# Patient Record
Sex: Male | Born: 1950 | Race: White | Hispanic: No | Marital: Married | Smoking: Former smoker
Health system: Southern US, Community
[De-identification: ages and names within clinical notes are randomized; demographics above are authoritative.]

## PROBLEM LIST (undated history)

## (undated) DIAGNOSIS — F0150 Vascular dementia without behavioral disturbance: Secondary | ICD-10-CM

## (undated) DIAGNOSIS — Z940 Kidney transplant status: Secondary | ICD-10-CM

## (undated) DIAGNOSIS — E213 Hyperparathyroidism, unspecified: Secondary | ICD-10-CM

## (undated) DIAGNOSIS — I639 Cerebral infarction, unspecified: Secondary | ICD-10-CM

## (undated) DIAGNOSIS — R600 Localized edema: Secondary | ICD-10-CM

## (undated) DIAGNOSIS — I82409 Acute embolism and thrombosis of unspecified deep veins of unspecified lower extremity: Secondary | ICD-10-CM

## (undated) DIAGNOSIS — Z8619 Personal history of other infectious and parasitic diseases: Secondary | ICD-10-CM

## (undated) DIAGNOSIS — D6861 Antiphospholipid syndrome: Secondary | ICD-10-CM

## (undated) DIAGNOSIS — E785 Hyperlipidemia, unspecified: Secondary | ICD-10-CM

## (undated) DIAGNOSIS — K219 Gastro-esophageal reflux disease without esophagitis: Secondary | ICD-10-CM

## (undated) DIAGNOSIS — G459 Transient cerebral ischemic attack, unspecified: Secondary | ICD-10-CM

## (undated) DIAGNOSIS — M1990 Unspecified osteoarthritis, unspecified site: Secondary | ICD-10-CM

## (undated) DIAGNOSIS — I739 Peripheral vascular disease, unspecified: Secondary | ICD-10-CM

## (undated) DIAGNOSIS — L719 Rosacea, unspecified: Secondary | ICD-10-CM

## (undated) DIAGNOSIS — I10 Essential (primary) hypertension: Secondary | ICD-10-CM

## (undated) DIAGNOSIS — D649 Anemia, unspecified: Secondary | ICD-10-CM

## (undated) DIAGNOSIS — K259 Gastric ulcer, unspecified as acute or chronic, without hemorrhage or perforation: Secondary | ICD-10-CM

## (undated) HISTORY — DX: Cerebral infarction, unspecified: I63.9

## (undated) HISTORY — PX: CHOLECYSTECTOMY: SHX55

## (undated) HISTORY — PX: KNEE ARTHROSCOPY: SUR90

## (undated) HISTORY — PX: ELBOW SURGERY: SHX618

## (undated) HISTORY — PX: KIDNEY TRANSPLANT: SHX239

---

## 1999-02-08 DIAGNOSIS — Z940 Kidney transplant status: Secondary | ICD-10-CM

## 1999-02-08 HISTORY — DX: Kidney transplant status: Z94.0

## 2012-05-29 ENCOUNTER — Emergency Department: Payer: Self-pay | Admitting: Emergency Medicine

## 2014-03-28 ENCOUNTER — Ambulatory Visit: Payer: Self-pay | Admitting: Internal Medicine

## 2014-09-17 ENCOUNTER — Other Ambulatory Visit: Payer: Self-pay | Admitting: Internal Medicine

## 2014-09-17 DIAGNOSIS — R102 Pelvic and perineal pain unspecified side: Secondary | ICD-10-CM

## 2014-09-17 DIAGNOSIS — M545 Low back pain: Secondary | ICD-10-CM

## 2014-09-17 DIAGNOSIS — R197 Diarrhea, unspecified: Secondary | ICD-10-CM

## 2014-09-17 DIAGNOSIS — R1084 Generalized abdominal pain: Secondary | ICD-10-CM

## 2014-09-23 ENCOUNTER — Ambulatory Visit
Admission: RE | Admit: 2014-09-23 | Discharge: 2014-09-23 | Disposition: A | Discharge status: Home / Self Care | Payer: Medicare Other | Source: Ambulatory Visit | Attending: Internal Medicine | Admitting: Internal Medicine

## 2014-09-23 DIAGNOSIS — R197 Diarrhea, unspecified: Secondary | ICD-10-CM

## 2014-09-23 DIAGNOSIS — R102 Pelvic and perineal pain unspecified side: Secondary | ICD-10-CM

## 2014-09-23 DIAGNOSIS — I714 Abdominal aortic aneurysm, without rupture: Secondary | ICD-10-CM | POA: Diagnosis not present

## 2014-09-23 DIAGNOSIS — R1084 Generalized abdominal pain: Secondary | ICD-10-CM

## 2014-09-23 DIAGNOSIS — N200 Calculus of kidney: Secondary | ICD-10-CM | POA: Insufficient documentation

## 2014-09-23 DIAGNOSIS — Z940 Kidney transplant status: Secondary | ICD-10-CM | POA: Diagnosis not present

## 2014-09-23 DIAGNOSIS — M545 Low back pain: Secondary | ICD-10-CM

## 2014-10-01 ENCOUNTER — Other Ambulatory Visit: Payer: Self-pay

## 2014-10-01 ENCOUNTER — Emergency Department
Admission: EM | Admit: 2014-10-01 | Discharge: 2014-10-01 | Disposition: A | Discharge status: Home / Self Care | Payer: Medicare Other | Attending: Emergency Medicine | Admitting: Emergency Medicine

## 2014-10-01 ENCOUNTER — Encounter: Payer: Self-pay | Admitting: *Deleted

## 2014-10-01 DIAGNOSIS — R197 Diarrhea, unspecified: Secondary | ICD-10-CM | POA: Diagnosis not present

## 2014-10-01 DIAGNOSIS — I10 Essential (primary) hypertension: Secondary | ICD-10-CM | POA: Insufficient documentation

## 2014-10-01 DIAGNOSIS — Z87891 Personal history of nicotine dependence: Secondary | ICD-10-CM | POA: Diagnosis not present

## 2014-10-01 DIAGNOSIS — R55 Syncope and collapse: Secondary | ICD-10-CM | POA: Insufficient documentation

## 2014-10-01 DIAGNOSIS — F0150 Vascular dementia without behavioral disturbance: Secondary | ICD-10-CM | POA: Diagnosis not present

## 2014-10-01 DIAGNOSIS — N289 Disorder of kidney and ureter, unspecified: Secondary | ICD-10-CM | POA: Diagnosis not present

## 2014-10-01 HISTORY — DX: Transient cerebral ischemic attack, unspecified: G45.9

## 2014-10-01 HISTORY — DX: Acute embolism and thrombosis of unspecified deep veins of unspecified lower extremity: I82.409

## 2014-10-01 HISTORY — DX: Personal history of other infectious and parasitic diseases: Z86.19

## 2014-10-01 HISTORY — DX: Rosacea, unspecified: L71.9

## 2014-10-01 HISTORY — DX: Hyperparathyroidism, unspecified: E21.3

## 2014-10-01 HISTORY — DX: Antiphospholipid syndrome: D68.61

## 2014-10-01 HISTORY — DX: Hyperlipidemia, unspecified: E78.5

## 2014-10-01 HISTORY — DX: Essential (primary) hypertension: I10

## 2014-10-01 HISTORY — DX: Vascular dementia, unspecified severity, without behavioral disturbance, psychotic disturbance, mood disturbance, and anxiety: F01.50

## 2014-10-01 HISTORY — DX: Localized edema: R60.0

## 2014-10-01 HISTORY — DX: Kidney transplant status: Z94.0

## 2014-10-01 LAB — BASIC METABOLIC PANEL
Anion gap: 4 — ABNORMAL LOW (ref 5–15)
BUN: 31 mg/dL — AB (ref 6–20)
CALCIUM: 9.4 mg/dL (ref 8.9–10.3)
CO2: 24 mmol/L (ref 22–32)
CREATININE: 2.37 mg/dL — AB (ref 0.61–1.24)
Chloride: 113 mmol/L — ABNORMAL HIGH (ref 101–111)
GFR calc Af Amer: 31 mL/min — ABNORMAL LOW (ref >60)
GFR, EST NON AFRICAN AMERICAN: 27 mL/min — AB (ref >60)
GLUCOSE: 91 mg/dL (ref 65–99)
POTASSIUM: 3.7 mmol/L (ref 3.5–5.1)
SODIUM: 141 mmol/L (ref 135–145)

## 2014-10-01 LAB — CBC
HEMATOCRIT: 35.3 % — AB (ref 40.0–52.0)
Hemoglobin: 11.4 g/dL — ABNORMAL LOW (ref 13.0–18.0)
MCH: 29.1 pg (ref 26.0–34.0)
MCHC: 32.3 g/dL (ref 32.0–36.0)
MCV: 89.9 fL (ref 80.0–100.0)
PLATELETS: 128 10*3/uL — AB (ref 150–440)
RBC: 3.93 MIL/uL — ABNORMAL LOW (ref 4.40–5.90)
RDW: 15 % — AB (ref 11.5–14.5)
WBC: 7.3 10*3/uL (ref 3.8–10.6)

## 2014-10-01 LAB — TROPONIN I

## 2014-10-01 NOTE — ED Provider Notes (Signed)
[facility] Emergency Department Provider Note  ____________________________________________  Time seen: 1255  I have reviewed the triage vital signs and the nursing notes.   HISTORY  Chief Complaint Loss of Consciousness  syncope    HPI Eric Gill is a 64 y.o. male who was at church today in an activity area. There is a bar on the wall. The patient reports she was leaning up against the bar and has no further recollection until he awoke with everybody around him.  The patient is currently alert and communicative, but does have a history of some dementia. Additional history is provided by his wife and daughter.  The wife reports indeed he was leaning up against the bar. She saw him start to slump over and slowly dropped to the ground. There was a nurse and an EMT present who began to help evaluate and protect the patient. 911 was called.  The patient woke up fairly soon after the syncopal episode. He denies any palpitations, shortness of breath, or chest pain. He denies any injury from easing down to the ground. The wife reports that he will cut fairly quickly and was back to his baseline. He did not appear to be additionally confused or in any distress once he awoke.  Patient does have a history of "TIAs". The family tells me that these TIAs were seen on an MRI that were done by Dr. Brigitte Pulse of neurology. 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  At this time, the patient is at his baseline, alert and communicative, and in no distress, without any focal deficit.  Of note, the patient has apparently had a diarrhea for a number of weeks.    Past Medical History  Diagnosis Date  . Status post kidney transplant 2001  . Hyperparathyroidism   . DVT (deep venous thrombosis)   . TIA (transient ischemic attack)   . Essential hypertension   . History of CMV   . Pedal edema   . Rosacea   . Antiphospholipid antibody syndrome   .  Hyperlipidemia   . 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   History reviewed. No pertinent past surgical history.  No current outpatient prescriptions on file.  Allergies Lipitor and Statins  History reviewed. No pertinent family history.  Social History Social History  Substance Use Topics  . Smoking status: Former Research scientist (life sciences)  . Smokeless tobacco: None  . Alcohol Use: No    Review of Systems  Constitutional: Negative for fever. ENT: Negative for sore throat. Cardiovascular: Negative for chest pain. Positive for syncope. See history of present illness Respiratory: Negative for shortness of breath. Gastrointestinal: When the patient began to pass out, he was noted to be vomiting. He has a history of diarrhea for a number of weeks currently. He takes a fair amount of Imodium.  Genitourinary: Negative for dysuria. Musculoskeletal: No myalgias or injuries. Skin: Negative for rash. Neurological: Negative for headaches. History of TIAs or CVAs. History of dementia.   10-point ROS otherwise negative.  ____________________________________________   PHYSICAL EXAM:  VITAL SIGNS: ED Triage Vitals  Enc Vitals Group     BP 10/01/14 1223 169/76 mmHg     Pulse Rate 10/01/14 1223 50     Resp 10/01/14 1223 15     Temp 10/01/14 1223 98 F (36.7 C)     Temp Source 10/01/14 1223 Oral     SpO2  10/01/14 1223 98 %     Weight 10/01/14 1223 150 lb (68.04 kg)     Height 10/01/14 1223 5\' 6"  (1.676 m)     Head Cir --      Peak Flow --      Pain Score --      Pain Loc --      Pain Edu? --      Excl. in [place]? --     Constitutional: Alert, communicative. Well appearing and in no distress. ENT   Head: Normocephalic and atraumatic.   Nose: No congestion/rhinnorhea.   Mouth/Throat: Mucous membranes are moist. No lac on the tongue. Cardiovascular: Normal bradycardic rate of 58., regular rhythm, no murmur  noted Respiratory:  Normal respiratory effort, no tachypnea.    Breath sounds are clear and equal bilaterally.  Gastrointestinal: Soft and nontender. No distention.  Back: No muscle spasm, no tenderness, no CVA tenderness. Musculoskeletal: No deformity noted. Nontender with normal range of motion in all extremities.  No noted edema. Neurologic:  Normal speech, but patient appears to defer to his family for history. No gross focal neurologic deficits are appreciated. 5 over 5 strength in all 4 extremities, equal grip strength, good finger to nose coordination, if ocular muscles are intact, cranial nerves II-12 are intact. He does appear to have mild dementia, noted in our communication. Skin:  Skin is warm, dry. No rash noted. Psychiatric: Pleasant demeanor and appropriate interaction overall, but the patient appears to defer to his family. I suspect this is due to some cognitive impairment related to his dementia.  ____________________________________________    LABS (pertinent positives/negatives)   Labs Reviewed  BASIC METABOLIC PANEL - Abnormal; Notable for the following:    Chloride 113 (*)    BUN 31 (*)    Creatinine, Ser 2.37 (*)    GFR calc non Af Amer 27 (*)    GFR calc Af Amer 31 (*)    Anion gap 4 (*)    All other components within normal limits  CBC - Abnormal; Notable for the following:    RBC 3.93 (*)    Hemoglobin 11.4 (*)    HCT 35.3 (*)    RDW 15.0 (*)    Platelets 128 (*)    All other components within normal limits  TROPONIN I  URINALYSIS COMPLETEWITH MICROSCOPIC ([facility] ONLY)  CBG MONITORING, ED     ____________________________________________   EKG  ED ECG REPORT I, Nataliya Graig W, the attending physician, personally viewed and interpreted this ECG.   Date: 10/01/2014  EKG Time: 12:49 PM  Rate: 53  Rhythm: Sinus bradycardia  Axis: Normal  Intervals: Normal  ST&T Change: None noted   ____________________________________________     PROCEDURES  ____________________________________________   INITIAL IMPRESSION / ASSESSMENT AND PLAN / ED COURSE  Pertinent labs & imaging results that were available during my care of the patient were reviewed by me and considered in my medical decision making (see chart for details).  Pleasant, alert, 64 year old male with a history of dementia, no cardiac history, with recent diarrhea, not drinking much fluids, and now with a syncopal episode. He had no prodrome, he had no seizure-like activity, he returned to baseline cognition and communication soon after the episode.  His renal function appears to be a little elevated today. I reviewed the numbers with his wife, who says that his renal function is usually lower. We will give him IV fluids and check orthostatic blood pressure readings. His EKG looks normal. If all is  well then, we will likely send him home.  Due to the history of recent diarrhea, we will attempt to obtain a C. difficile test if he is able to provide a stool sample.  ----------------------------------------- 2:46 PM on 10/01/2014 -----------------------------------------  Patient appears well and has had a stable blood pressure and heart rate through his stay. His EKG looks good and his cardiac monitor is not shown any dysrhythmia.  I've spoken with the patient's primary physician, Fulton Reek. He reviewed the patient's recent lab tests that showed a BUN of 29 and a creatinine of 2.1. We have treated the patient with 1 L of normal saline for the slight elevation in his baseline renal function. The patient also has a long history of diarrhea. Dr. Doy Hutching has made a referral to gastroenterology for ongoing evaluation. Dr. Doy Hutching and I are both comfortable with patient going home. Dr. Doy Hutching will see the patient on Friday for further evaluation. ____________________________________________   FINAL CLINICAL IMPRESSION(S) / ED DIAGNOSES  Final diagnoses:  Syncope and  collapse  Renal insufficiency      Ahmed Prima, MD 10/01/14 [phone]

## 2014-10-01 NOTE — ED Notes (Signed)
Hat placed in toilet to catch stool sample per MD order. Pt verbalized understanding of need for stool sample.

## 2014-10-01 NOTE — ED Notes (Signed)
Pt currently receiving NS bolus from EMS fluid bag per MD verbal order.

## 2014-10-01 NOTE — Discharge Instructions (Signed)
Your blood tests overall look good. Your renal function was only slightly higher than recent measurements. This was discussed here in the emergency department with you and your family, but also with your primary doctor, Dr. Doy Hutching. See Dr. Doy Hutching on Friday. Return to the emergency department if you have any further aching spells or other urgent concerns.  Syncope Syncope is a medical term for fainting or passing out. This means you lose consciousness and drop to the ground. People are generally unconscious for less than 5 minutes. You may have some muscle twitches for up to 15 seconds before waking up and returning to normal. Syncope occurs more often in older adults, but it can happen to anyone. While most causes of syncope are not dangerous, syncope can be a sign of a serious medical problem. It is important to seek medical care.  CAUSES  Syncope is caused by a sudden drop in blood flow to the brain. The specific cause is often not determined. Factors that can bring on syncope include:  Taking medicines that lower blood pressure.  Sudden changes in posture, such as standing up quickly.  Taking more medicine than prescribed.  Standing in one place for too long.  Seizure disorders.  Dehydration and excessive exposure to heat.  Low blood sugar (hypoglycemia).  Straining to have a bowel movement.  Heart disease, irregular heartbeat, or other circulatory problems.  Fear, emotional distress, seeing blood, or severe pain. SYMPTOMS  Right before fainting, you may:  Feel dizzy or light-headed.  Feel nauseous.  See all white or all black in your field of vision.  Have cold, clammy skin. DIAGNOSIS  Your health care provider will ask about your symptoms, perform a physical exam, and perform an electrocardiogram (ECG) to record the electrical activity of your heart. Your health care provider may also perform other heart or blood tests to determine the cause of your syncope which may  include:  Transthoracic echocardiogram (TTE). During echocardiography, sound waves are used to evaluate how blood flows through your heart.  Transesophageal echocardiogram (TEE).  Cardiac monitoring. This allows your health care provider to monitor your heart rate and rhythm in real time.  Holter monitor. This is a portable device that records your heartbeat and can help diagnose heart arrhythmias. It allows your health care provider to track your heart activity for several days, if needed.  Stress tests by exercise or by giving medicine that makes the heart beat faster. TREATMENT  In most cases, no treatment is needed. Depending on the cause of your syncope, your health care provider may recommend changing or stopping some of your medicines. HOME CARE INSTRUCTIONS  Have someone stay with you until you feel stable.  Do not drive, use machinery, or play sports until your health care provider says it is okay.  Keep all follow-up appointments as directed by your health care provider.  Lie down right away if you start feeling like you might faint. Breathe deeply and steadily. Wait until all the symptoms have passed.  Drink enough fluids to keep your urine clear or pale yellow.  If you are taking blood pressure or heart medicine, get up slowly and take several minutes to sit and then stand. This can reduce dizziness. SEEK IMMEDIATE MEDICAL CARE IF:   You have a severe headache.  You have unusual pain in the chest, abdomen, or back.  You are bleeding from your mouth or rectum, or you have black or tarry stool.  You have an irregular or very fast  heartbeat.  You have pain with breathing.  You have repeated fainting or seizure-like jerking during an episode.  You faint when sitting or lying down.  You have confusion.  You have trouble walking.  You have severe weakness.  You have vision problems. If you fainted, call your local emergency services (911 in [place] Do not drive  yourself to the hospital.  MAKE SURE YOU:  Understand these instructions.  Will watch your condition.  Will get help right away if you are not doing well or get worse. Document Released: 01/24/2005 Document Revised: 01/29/2013 Document Reviewed: 03/25/2011 [facility] Patient Information 2015 [place], [place]. This information is not intended to replace advice given to you by your health care provider. Make sure you discuss any questions you have with your health care provider.

## 2014-10-01 NOTE — ED Notes (Signed)
Pt arrived via EMS from [place] after syncopal episode. Per pts family pts right side went stiff and pt went unconscious for unknown amount of time. Family reports pt vomiting before going unconscious. Pt has hx of TIAs and reported f"not feeling right" for the past couple days. EMS reports neuro checks were negative and pt has not deficits upon arrival to ED. Pt verbalizes feeling better at this time but denies memory of event.

## 2014-10-03 ENCOUNTER — Other Ambulatory Visit: Payer: Self-pay | Admitting: Internal Medicine

## 2014-10-03 DIAGNOSIS — R55 Syncope and collapse: Secondary | ICD-10-CM

## 2014-10-07 ENCOUNTER — Ambulatory Visit
Admission: RE | Admit: 2014-10-07 | Discharge: 2014-10-07 | Disposition: A | Discharge status: Home / Self Care | Payer: Medicare Other | Source: Ambulatory Visit | Attending: Internal Medicine | Admitting: Internal Medicine

## 2014-10-07 DIAGNOSIS — R55 Syncope and collapse: Secondary | ICD-10-CM | POA: Diagnosis present

## 2014-10-29 ENCOUNTER — Emergency Department
Admission: EM | Admit: 2014-10-29 | Discharge: 2014-10-29 | Disposition: A | Discharge status: Home / Self Care | Payer: Medicare Other | Attending: Emergency Medicine | Admitting: Emergency Medicine

## 2014-10-29 ENCOUNTER — Emergency Department: Payer: Medicare Other

## 2014-10-29 ENCOUNTER — Encounter: Payer: Self-pay | Admitting: Emergency Medicine

## 2014-10-29 DIAGNOSIS — R531 Weakness: Secondary | ICD-10-CM | POA: Diagnosis present

## 2014-10-29 DIAGNOSIS — I10 Essential (primary) hypertension: Secondary | ICD-10-CM | POA: Insufficient documentation

## 2014-10-29 DIAGNOSIS — R410 Disorientation, unspecified: Secondary | ICD-10-CM

## 2014-10-29 DIAGNOSIS — Z87891 Personal history of nicotine dependence: Secondary | ICD-10-CM | POA: Diagnosis not present

## 2014-10-29 DIAGNOSIS — R200 Anesthesia of skin: Secondary | ICD-10-CM | POA: Insufficient documentation

## 2014-10-29 LAB — URINALYSIS COMPLETE WITH MICROSCOPIC (ARMC ONLY)
BILIRUBIN URINE: NEGATIVE
GLUCOSE, UA: NEGATIVE mg/dL
Hgb urine dipstick: NEGATIVE
Ketones, ur: NEGATIVE mg/dL
Leukocytes, UA: NEGATIVE
NITRITE: NEGATIVE
PH: 5 (ref 5.0–8.0)
Protein, ur: NEGATIVE mg/dL
Specific Gravity, Urine: 1.014 (ref 1.005–1.030)

## 2014-10-29 LAB — COMPREHENSIVE METABOLIC PANEL
ALK PHOS: 95 U/L (ref 38–126)
ALT: 13 U/L — AB (ref 17–63)
ANION GAP: 9 (ref 5–15)
AST: 28 U/L (ref 15–41)
Albumin: 3.9 g/dL (ref 3.5–5.0)
BUN: 31 mg/dL — ABNORMAL HIGH (ref 6–20)
CALCIUM: 9.6 mg/dL (ref 8.9–10.3)
CO2: 23 mmol/L (ref 22–32)
CREATININE: 2.44 mg/dL — AB (ref 0.61–1.24)
Chloride: 107 mmol/L (ref 101–111)
GFR, EST AFRICAN AMERICAN: 30 mL/min — AB (ref >60)
GFR, EST NON AFRICAN AMERICAN: 26 mL/min — AB (ref >60)
Glucose, Bld: 112 mg/dL — ABNORMAL HIGH (ref 65–99)
Potassium: 4 mmol/L (ref 3.5–5.1)
Sodium: 139 mmol/L (ref 135–145)
TOTAL PROTEIN: 6.6 g/dL (ref 6.5–8.1)
Total Bilirubin: 0.3 mg/dL (ref 0.3–1.2)

## 2014-10-29 LAB — CBC WITH DIFFERENTIAL/PLATELET
Basophils Absolute: 0 10*3/uL (ref 0–0.1)
Basophils Relative: 1 %
EOS PCT: 3 %
Eosinophils Absolute: 0.2 10*3/uL (ref 0–0.7)
HCT: 36.9 % — ABNORMAL LOW (ref 40.0–52.0)
HEMOGLOBIN: 11.8 g/dL — AB (ref 13.0–18.0)
LYMPHS ABS: 1.1 10*3/uL (ref 1.0–3.6)
LYMPHS PCT: 15 %
MCH: 28.8 pg (ref 26.0–34.0)
MCHC: 32 g/dL (ref 32.0–36.0)
MCV: 89.9 fL (ref 80.0–100.0)
MONOS PCT: 9 %
Monocytes Absolute: 0.7 10*3/uL (ref 0.2–1.0)
NEUTROS PCT: 72 %
Neutro Abs: 5.4 10*3/uL (ref 1.4–6.5)
Platelets: 140 10*3/uL — ABNORMAL LOW (ref 150–440)
RBC: 4.11 MIL/uL — AB (ref 4.40–5.90)
RDW: 14.7 % — ABNORMAL HIGH (ref 11.5–14.5)
WBC: 7.4 10*3/uL (ref 3.8–10.6)

## 2014-10-29 LAB — TROPONIN I: Troponin I: <0.03 ng/mL (ref <0.031)

## 2014-10-29 LAB — PROTIME-INR
INR: 2.02
PROTHROMBIN TIME: 23 s — AB (ref 11.4–15.0)

## 2014-10-29 MED ORDER — SODIUM CHLORIDE 0.9 % IV SOLN
1000.0000 mL | Freq: Once | INTRAVENOUS | Status: AC
  Administered 2014-10-29: 1000 mL via INTRAVENOUS

## 2014-10-29 NOTE — Discharge Instructions (Signed)
Confusion Confusion is the inability to think with your usual speed or clarity. Confusion may come on quickly or slowly over time. How quickly the confusion comes on depends on the cause. Confusion can be due to any number of causes. CAUSES   Concussion, head injury, or head trauma.  Seizures.  Stroke.  Fever.  Brain tumor.  Age related decreased brain function (dementia).  Heightened emotional states like rage or terror.  Mental illness in which the person loses the ability to determine what is real and what is not (hallucinations).  Infections such as a urinary tract infection (UTI).  Toxic effects from alcohol, drugs, or prescription medicines.  Dehydration and an imbalance of salts in the body (electrolytes).  Lack of sleep.  Low blood sugar (diabetes).  Low levels of oxygen from conditions such as chronic lung disorders.  Drug interactions or other medicine side effects.  Nutritional deficiencies, especially niacin, thiamine, vitamin C, or vitamin B.  Sudden drop in body temperature (hypothermia).  Change in routine, such as when traveling or hospitalized. SIGNS AND SYMPTOMS  People often describe their thinking as cloudy or unclear when they are confused. Confusion can also include feeling disoriented. That means you are unaware of where or who you are. You may also not know what the date or time is. If confused, you may also have difficulty paying attention, remembering, and making decisions. Some people also act aggressively when they are confused.  DIAGNOSIS  The medical evaluation of confusion may include:  Blood and urine tests.  X-rays.  Brain and nervous system tests.  Analyzing your brain waves (electroencephalogram or EEG).  Magnetic resonance imaging (MRI) of your head.  Computed tomography (CT) scan of your head.  Mental status tests in which your health care provider may ask many questions. Some of these questions may seem silly or strange,  but they are a very important test to help diagnose and treat confusion. TREATMENT  An admission to the hospital may not be needed, but a person with confusion should not be left alone. Stay with a family member or friend until the confusion clears. Avoid alcohol, pain relievers, or sedative drugs until you have fully recovered. Do not drive until directed by your health care provider. HOME CARE INSTRUCTIONS  What family and friends can do:  To find out if someone is confused, ask the person to state his or her name, age, and the date. If the person is unsure or answers incorrectly, he or she is confused.  Always introduce yourself, no matter how well the person knows you.  Often remind the person of his or her location.  Place a calendar and clock near the confused person.  Help the person with his or her medicines. You may want to use a pill box, an alarm as a reminder, or give the person each dose as prescribed.  Talk about current events and plans for the day.  Try to keep the environment calm, quiet, and peaceful.  Make sure the person keeps follow-up visits with his or her health care provider. PREVENTION  Ways to prevent confusion:  Avoid alcohol.  Eat a balanced diet.  Get enough sleep.  Take medicine only as directed by your health care provider.  Do not become isolated. Spend time with other people and make plans for your days.  Keep careful watch on your blood sugar levels if you are diabetic. SEEK IMMEDIATE MEDICAL CARE IF:   You develop severe headaches, repeated vomiting, seizures, blackouts, or  slurred speech.  There is increasing confusion, weakness, numbness, restlessness, or personality changes.  You develop a loss of balance, have marked dizziness, feel uncoordinated, or fall.  You have delusions, hallucinations, or develop severe anxiety.  Your family members think you need to be rechecked. Document Released: 03/03/2004 Document Revised: 06/10/2013  Document Reviewed: 03/01/2013 [facility] Patient Information 2015 [place], [place]. This information is not intended to replace advice given to you by your health care provider. Make sure you discuss any questions you have with your health care provider.  Paresthesia Paresthesia is an abnormal burning or prickling sensation. This sensation is generally felt in the hands, arms, legs, or feet. However, it may occur in any part of the body. It is usually not painful. The feeling may be described as:  Tingling or numbness.  "Pins and needles."  Skin crawling.  Buzzing.  Limbs "falling asleep."  Itching. Most people experience temporary (transient) paresthesia at some time in their lives. CAUSES  Paresthesia may occur when you breathe too quickly (hyperventilation). It can also occur without any apparent cause. Commonly, paresthesia occurs when pressure is placed on a nerve. The feeling quickly goes away once the pressure is removed. For some people, however, paresthesia is a long-lasting (chronic) condition caused by an underlying disorder. The underlying disorder may be:  A traumatic, direct injury to nerves. Examples include a:  Broken (fractured) neck.  Fractured skull.  A disorder affecting the brain and spinal cord (central nervous system). Examples include:  Transverse myelitis.  Encephalitis.  Transient ischemic attack.  Multiple sclerosis.  Stroke.  Tumor or blood vessel problems, such as an arteriovenous malformation pressing against the brain or spinal cord.  A condition that damages the peripheral nerves (peripheral neuropathy). Peripheral nerves are not part of the brain and spinal cord. These conditions include:  Diabetes.  Peripheral vascular disease.  Nerve entrapment syndromes, such as carpal tunnel syndrome.  Shingles.  Hypothyroidism.  Vitamin B12 deficiencies.  Alcoholism.  Heavy metal poisoning (lead, arsenic).  Rheumatoid arthritis.  Systemic  lupus erythematosus. DIAGNOSIS  Your caregiver will attempt to find the underlying cause of your paresthesia. Your caregiver may:  Take your medical history.  Perform a physical exam.  Order various lab tests.  Order imaging tests. TREATMENT  Treatment for paresthesia depends on the underlying cause. HOME CARE INSTRUCTIONS  Avoid drinking alcohol.  You may consider massage or acupuncture to help relieve your symptoms.  Keep all follow-up appointments as directed by your caregiver. SEEK IMMEDIATE MEDICAL CARE IF:   You feel weak.  You have trouble walking or moving.  You have problems with speech or vision.  You feel confused.  You cannot control your bladder or bowel movements.  You feel numbness after an injury.  You faint.  Your burning or prickling feeling gets worse when walking.  You have pain, cramps, or dizziness.  You develop a rash. MAKE SURE YOU:  Understand these instructions.  Will watch your condition.  Will get help right away if you are not doing well or get worse. Document Released: 01/14/2002 Document Revised: 04/18/2011 Document Reviewed: 10/15/2010 [facility] Patient Information 2015 [place], [place]. This information is not intended to replace advice given to you by your health care provider. Make sure you discuss any questions you have with your health care provider.

## 2014-10-29 NOTE — ED Notes (Signed)
Pt a&o, skin w/d.  Family reports pt had syncopal episode 3 weeks ago and has seemed fatigued since.  Reports another episode yesterday and went in to see Dr. Doy Hutching this am and reported right arm numbness with unclear time of onset.  Has resolved at this time.  Pt has mild dementia but answers questions appropriately at this time.  PERRL (2).  Grips are equal, pedal strength equal, no facial droop. Pt placed on monitor.  Dr. Jimmye Norman at bedside, no code stroke ordered.

## 2014-10-29 NOTE — ED Provider Notes (Signed)
[facility] Emergency Department Provider Note     Time seen: ----------------------------------------- 10:23 AM on 10/29/2014 -----------------------------------------    I have reviewed the triage vital signs and the nursing notes.   HISTORY  Chief Complaint Weakness    HPI Eric Gill is a 64 y.o. male presents to ER with single episode 3 weeks ago and feeling fatigued since that period time. He reports another episode yesterday, report some right armnumbness this morning. Numbness seemed to have resolved this time, also when he got to the desk at [facility] [employer] he cannot remember his phone number. This is unusual for him, he denies any other complaints.   Past Medical History  Diagnosis Date  . Status post kidney transplant 2001  . Hyperparathyroidism   . DVT (deep venous thrombosis)   . TIA (transient ischemic attack)   . Essential hypertension   . History of CMV   . Pedal edema   . Rosacea   . Antiphospholipid antibody syndrome   . Hyperlipidemia   . 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   History reviewed. No pertinent past surgical history.  Allergies Lipitor and Statins  Social History Social History  Substance Use Topics  . Smoking status: Former Research scientist (life sciences)  . Smokeless tobacco: None  . Alcohol Use: No    Review of Systems Constitutional: Negative for fever. Eyes: Negative for visual changes. ENT: Negative for sore throat. Cardiovascular: Negative for chest pain. Respiratory: Negative for shortness of breath. Gastrointestinal: Negative for abdominal pain, vomiting and diarrhea. Genitourinary: Negative for dysuria. Musculoskeletal: Negative for back pain. Skin: Negative for rash. Neurological: Negative for headaches, positive for numbness and confusion  10-point ROS otherwise negative.  ____________________________________________   PHYSICAL  EXAM:  VITAL SIGNS: ED Triage Vitals  Enc Vitals Group     BP 10/29/14 0939 139/68 mmHg     Pulse Rate 10/29/14 0939 56     Resp 10/29/14 0939 16     Temp 10/29/14 0941 97.8 F (36.6 C)     Temp Source 10/29/14 0941 Oral     SpO2 10/29/14 0939 97 %     Weight 10/29/14 0939 145 lb (65.772 kg)     Height 10/29/14 0939 5\' 6"  (1.676 m)     Head Cir --      Peak Flow --      Pain Score --      Pain Loc --      Pain Edu? --      Excl. in [place]? --     Constitutional: Alert and oriented. Well appearing and in no distress. Eyes: Conjunctivae are normal. PERRL. Normal extraocular movements. ENT   Head: Normocephalic and atraumatic.   Nose: No congestion/rhinnorhea.   Mouth/Throat: Mucous membranes are moist.   Neck: No stridor. Cardiovascular: Normal rate, regular rhythm. Normal and symmetric distal pulses are present in all extremities. No murmurs, rubs, or gallops. Respiratory: Normal respiratory effort without tachypnea nor retractions. Breath sounds are clear and equal bilaterally. No wheezes/rales/rhonchi. Gastrointestinal: Soft and nontender. No distention. No abdominal bruits.  Musculoskeletal: Nontender with normal range of motion in all extremities. No joint effusions.  No lower extremity tenderness nor edema. Neurologic:  Normal speech and language. No gross focal neurologic deficits are appreciated. Speech is normal. No gait instability. Skin:  Skin is warm, dry and intact. No rash noted. Psychiatric: Mood and affect are normal. Speech and behavior are normal. Patient exhibits  appropriate insight and judgment. ____________________________________________  EKG: Interpreted by me.sinus bradycardia with rate of 54 bpm, normal PR interval, normal QRS with, normal QT interval. On specific T wave flattening  ____________________________________________  ED COURSE:  Pertinent labs & imaging results that were available during my care of the patient were reviewed by me  and considered in my medical decision making (see chart for details). Patient needs CT imaging as well as basic labs. Will discuss with neurology. ____________________________________________    LABS (pertinent positives/negatives)  Labs Reviewed  CBC WITH DIFFERENTIAL/PLATELET - Abnormal; Notable for the following:    RBC 4.11 (*)    Hemoglobin 11.8 (*)    HCT 36.9 (*)    RDW 14.7 (*)    Platelets 140 (*)    All other components within normal limits  COMPREHENSIVE METABOLIC PANEL - Abnormal; Notable for the following:    Glucose, Bld 112 (*)    BUN 31 (*)    Creatinine, Ser 2.44 (*)    ALT 13 (*)    GFR calc non Af Amer 26 (*)    GFR calc Af Amer 30 (*)    All other components within normal limits  PROTIME-INR - Abnormal; Notable for the following:    Prothrombin Time 23.0 (*)    All other components within normal limits  TROPONIN I  URINALYSIS COMPLETEWITH MICROSCOPIC ([facility] ONLY)    RADIOLOGY Images were viewed by me  CT head IMPRESSION: 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  Chronic small vessel disease.  No acute intracranial abnormality. ____________________________________________  FINAL ASSESSMENT AND PLAN  Confusion, numbness  Plan: Patient with labs and imaging as dictated above. I have discussed the case with Dr. Manuella Ghazi from neurology. Patient has unremarkable workup at this point. No acute neurologic deficits are appreciated. He will continue outpatient follow-up and have an MRI. Currently is adequately anticoagulated.   Earleen Newport, MD   Earleen Newport, MD 10/29/14 [phone]

## 2014-10-29 NOTE — ED Notes (Signed)
Pt presents with weakness acute onset this am. Pt right arm numbness this am but none now. Pt with no slurred speech, equal hand grips, denies any headache or any vision changes at this time.

## 2014-10-31 LAB — URINE CULTURE

## 2015-02-23 ENCOUNTER — Encounter: Payer: Self-pay | Admitting: Emergency Medicine

## 2015-02-23 ENCOUNTER — Inpatient Hospital Stay
Admission: EM | Admit: 2015-02-23 | Discharge: 2015-03-08 | DRG: 378 | Disposition: A | Discharge status: Home / Self Care | Payer: Medicare Other | Attending: Internal Medicine | Admitting: Internal Medicine

## 2015-02-23 DIAGNOSIS — E785 Hyperlipidemia, unspecified: Secondary | ICD-10-CM | POA: Diagnosis present

## 2015-02-23 DIAGNOSIS — K640 First degree hemorrhoids: Secondary | ICD-10-CM | POA: Diagnosis present

## 2015-02-23 DIAGNOSIS — T8610 Unspecified complication of kidney transplant: Secondary | ICD-10-CM

## 2015-02-23 DIAGNOSIS — K219 Gastro-esophageal reflux disease without esophagitis: Secondary | ICD-10-CM | POA: Diagnosis present

## 2015-02-23 DIAGNOSIS — Z87891 Personal history of nicotine dependence: Secondary | ICD-10-CM

## 2015-02-23 DIAGNOSIS — Z7901 Long term (current) use of anticoagulants: Secondary | ICD-10-CM | POA: Diagnosis not present

## 2015-02-23 DIAGNOSIS — Z841 Family history of disorders of kidney and ureter: Secondary | ICD-10-CM

## 2015-02-23 DIAGNOSIS — F0150 Vascular dementia without behavioral disturbance: Secondary | ICD-10-CM | POA: Diagnosis present

## 2015-02-23 DIAGNOSIS — K449 Diaphragmatic hernia without obstruction or gangrene: Secondary | ICD-10-CM | POA: Diagnosis present

## 2015-02-23 DIAGNOSIS — N2581 Secondary hyperparathyroidism of renal origin: Secondary | ICD-10-CM | POA: Diagnosis present

## 2015-02-23 DIAGNOSIS — K5730 Diverticulosis of large intestine without perforation or abscess without bleeding: Secondary | ICD-10-CM | POA: Diagnosis present

## 2015-02-23 DIAGNOSIS — Z825 Family history of asthma and other chronic lower respiratory diseases: Secondary | ICD-10-CM | POA: Diagnosis not present

## 2015-02-23 DIAGNOSIS — D6861 Antiphospholipid syndrome: Secondary | ICD-10-CM | POA: Diagnosis present

## 2015-02-23 DIAGNOSIS — D649 Anemia, unspecified: Secondary | ICD-10-CM

## 2015-02-23 DIAGNOSIS — K922 Gastrointestinal hemorrhage, unspecified: Secondary | ICD-10-CM

## 2015-02-23 DIAGNOSIS — D62 Acute posthemorrhagic anemia: Secondary | ICD-10-CM

## 2015-02-23 DIAGNOSIS — N183 Chronic kidney disease, stage 3 (moderate): Secondary | ICD-10-CM | POA: Diagnosis present

## 2015-02-23 DIAGNOSIS — Z940 Kidney transplant status: Secondary | ICD-10-CM | POA: Diagnosis not present

## 2015-02-23 DIAGNOSIS — Z833 Family history of diabetes mellitus: Secondary | ICD-10-CM | POA: Diagnosis not present

## 2015-02-23 DIAGNOSIS — I129 Hypertensive chronic kidney disease with stage 1 through stage 4 chronic kidney disease, or unspecified chronic kidney disease: Secondary | ICD-10-CM | POA: Diagnosis present

## 2015-02-23 DIAGNOSIS — K921 Melena: Principal | ICD-10-CM | POA: Diagnosis present

## 2015-02-23 DIAGNOSIS — N1832 Chronic kidney disease, stage 3b: Secondary | ICD-10-CM

## 2015-02-23 DIAGNOSIS — K644 Residual hemorrhoidal skin tags: Secondary | ICD-10-CM | POA: Diagnosis present

## 2015-02-23 DIAGNOSIS — K2970 Gastritis, unspecified, without bleeding: Secondary | ICD-10-CM | POA: Diagnosis present

## 2015-02-23 DIAGNOSIS — Z8673 Personal history of transient ischemic attack (TIA), and cerebral infarction without residual deficits: Secondary | ICD-10-CM | POA: Diagnosis not present

## 2015-02-23 DIAGNOSIS — E872 Acidosis: Secondary | ICD-10-CM | POA: Diagnosis present

## 2015-02-23 DIAGNOSIS — K259 Gastric ulcer, unspecified as acute or chronic, without hemorrhage or perforation: Secondary | ICD-10-CM | POA: Diagnosis present

## 2015-02-23 DIAGNOSIS — Z8249 Family history of ischemic heart disease and other diseases of the circulatory system: Secondary | ICD-10-CM

## 2015-02-23 DIAGNOSIS — K317 Polyp of stomach and duodenum: Secondary | ICD-10-CM | POA: Diagnosis present

## 2015-02-23 DIAGNOSIS — D631 Anemia in chronic kidney disease: Secondary | ICD-10-CM | POA: Diagnosis present

## 2015-02-23 DIAGNOSIS — N179 Acute kidney failure, unspecified: Secondary | ICD-10-CM

## 2015-02-23 DIAGNOSIS — Z86718 Personal history of other venous thrombosis and embolism: Secondary | ICD-10-CM

## 2015-02-23 LAB — COMPREHENSIVE METABOLIC PANEL
ALBUMIN: 3.1 g/dL — AB (ref 3.5–5.0)
ALT: 13 U/L — AB (ref 17–63)
ANION GAP: 8 (ref 5–15)
AST: 19 U/L (ref 15–41)
Alkaline Phosphatase: 64 U/L (ref 38–126)
BILIRUBIN TOTAL: 0.6 mg/dL (ref 0.3–1.2)
BUN: 65 mg/dL — ABNORMAL HIGH (ref 6–20)
CALCIUM: 9.5 mg/dL (ref 8.9–10.3)
CHLORIDE: 110 mmol/L (ref 101–111)
CO2: 25 mmol/L (ref 22–32)
Creatinine, Ser: 2.19 mg/dL — ABNORMAL HIGH (ref 0.61–1.24)
GFR, EST AFRICAN AMERICAN: 34 mL/min — AB (ref >60)
GFR, EST NON AFRICAN AMERICAN: 30 mL/min — AB (ref >60)
Glucose, Bld: 104 mg/dL — ABNORMAL HIGH (ref 65–99)
POTASSIUM: 4.4 mmol/L (ref 3.5–5.1)
SODIUM: 143 mmol/L (ref 135–145)
Total Protein: 5.4 g/dL — ABNORMAL LOW (ref 6.5–8.1)

## 2015-02-23 LAB — CBC
HCT: 23.3 % — ABNORMAL LOW (ref 40.0–52.0)
HEMOGLOBIN: 7.3 g/dL — AB (ref 13.0–18.0)
MCH: 27.6 pg (ref 26.0–34.0)
MCHC: 31.4 g/dL — ABNORMAL LOW (ref 32.0–36.0)
MCV: 87.9 fL (ref 80.0–100.0)
Platelets: 130 10*3/uL — ABNORMAL LOW (ref 150–440)
RBC: 2.65 MIL/uL — AB (ref 4.40–5.90)
RDW: 14 % (ref 11.5–14.5)
WBC: 8.4 10*3/uL (ref 3.8–10.6)

## 2015-02-23 LAB — URINALYSIS COMPLETE WITH MICROSCOPIC (ARMC ONLY)
BILIRUBIN URINE: NEGATIVE
Bacteria, UA: NONE SEEN
Glucose, UA: NEGATIVE mg/dL
KETONES UR: NEGATIVE mg/dL
Leukocytes, UA: NEGATIVE
Nitrite: NEGATIVE
PH: 5 (ref 5.0–8.0)
Protein, ur: NEGATIVE mg/dL
Specific Gravity, Urine: 1.015 (ref 1.005–1.030)

## 2015-02-23 LAB — ABO/RH: ABO/RH(D): A NEG

## 2015-02-23 LAB — HEMOGLOBIN: HEMOGLOBIN: 7 g/dL — AB (ref 13.0–18.0)

## 2015-02-23 LAB — PROTIME-INR
INR: 2.92
Prothrombin Time: 30 seconds — ABNORMAL HIGH (ref 11.4–15.0)

## 2015-02-23 LAB — LIPASE, BLOOD: Lipase: 34 U/L (ref 11–51)

## 2015-02-23 MED ORDER — CALCITRIOL 0.25 MCG PO CAPS
0.2500 ug | ORAL_CAPSULE | Freq: Every day | ORAL | Status: DC
  Administered 2015-02-23 – 2015-03-04 (×10): 0.25 ug via ORAL
  Filled 2015-02-23 (×10): qty 1

## 2015-02-23 MED ORDER — DONEPEZIL HCL 5 MG PO TABS
10.0000 mg | ORAL_TABLET | Freq: Every day | ORAL | Status: DC
  Administered 2015-02-23 – 2015-03-07 (×13): 10 mg via ORAL
  Filled 2015-02-23 (×13): qty 2

## 2015-02-23 MED ORDER — PANTOPRAZOLE SODIUM 40 MG IV SOLR
40.0000 mg | Freq: Two times a day (BID) | INTRAVENOUS | Status: DC
  Administered 2015-02-23 – 2015-02-28 (×11): 40 mg via INTRAVENOUS
  Filled 2015-02-23 (×11): qty 40

## 2015-02-23 MED ORDER — PREDNISONE 5 MG PO TABS
5.0000 mg | ORAL_TABLET | Freq: Every day | ORAL | Status: DC
  Administered 2015-02-24 – 2015-03-05 (×8): 5 mg via ORAL
  Filled 2015-02-23 (×9): qty 1

## 2015-02-23 MED ORDER — CITALOPRAM HYDROBROMIDE 20 MG PO TABS
10.0000 mg | ORAL_TABLET | Freq: Every day | ORAL | Status: DC
Start: 2015-02-23 — End: 2015-03-08
  Administered 2015-02-23 – 2015-03-07 (×13): 10 mg via ORAL
  Filled 2015-02-23: qty 2
  Filled 2015-02-23: qty 1
  Filled 2015-02-23: qty 2
  Filled 2015-02-23 (×10): qty 1

## 2015-02-23 MED ORDER — TACROLIMUS 0.5 MG PO CAPS
0.5000 mg | ORAL_CAPSULE | Freq: Every day | ORAL | Status: DC
  Administered 2015-02-23 – 2015-03-04 (×10): 0.5 mg via ORAL
  Filled 2015-02-23 (×12): qty 1

## 2015-02-23 MED ORDER — MONTELUKAST SODIUM 10 MG PO TABS
10.0000 mg | ORAL_TABLET | Freq: Every day | ORAL | Status: DC
  Administered 2015-02-25 – 2015-03-07 (×9): 10 mg via ORAL
  Filled 2015-02-23 (×10): qty 1

## 2015-02-23 MED ORDER — MYCOPHENOLATE MOFETIL 250 MG PO CAPS
500.0000 mg | ORAL_CAPSULE | Freq: Two times a day (BID) | ORAL | Status: DC
  Administered 2015-02-23 – 2015-03-08 (×24): 500 mg via ORAL
  Filled 2015-02-23 (×25): qty 2

## 2015-02-23 MED ORDER — PANTOPRAZOLE SODIUM 40 MG IV SOLR
40.0000 mg | Freq: Once | INTRAVENOUS | Status: AC
  Administered 2015-02-23: 40 mg via INTRAVENOUS
  Filled 2015-02-23: qty 40

## 2015-02-23 MED ORDER — AMLODIPINE BESYLATE 5 MG PO TABS
5.0000 mg | ORAL_TABLET | Freq: Every day | ORAL | Status: DC
  Administered 2015-02-24 – 2015-02-27 (×4): 5 mg via ORAL
  Filled 2015-02-23 (×4): qty 1

## 2015-02-23 MED ORDER — SODIUM CHLORIDE 0.9 % IV SOLN
INTRAVENOUS | Status: DC
  Administered 2015-02-23 – 2015-02-25 (×3): via INTRAVENOUS

## 2015-02-23 MED ORDER — LOSARTAN POTASSIUM 50 MG PO TABS
50.0000 mg | ORAL_TABLET | Freq: Every day | ORAL | Status: DC
  Administered 2015-02-23 – 2015-03-06 (×12): 50 mg via ORAL
  Filled 2015-02-23 (×12): qty 1

## 2015-02-23 NOTE — ED Notes (Signed)
States awoke this am with lower abdominal pain and has had 3 episodes of vomiting today and one episode of diarrhea.  Denies c/o abdominal pain now.  Spouse called EMS because some emesis was black.  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

## 2015-02-23 NOTE — H&P (Signed)
[facility] [place] NAME: Eric Gill    MR#:  [id number]  DATE OF BIRTH:  [birth year]  DATE OF ADMISSION:  02/23/2015  PRIMARY CARE PHYSICIAN: Idelle Crouch, MD   REQUESTING/REFERRING PHYSICIAN: Archie Balboa  CHIEF COMPLAINT:   Chief Complaint  Patient presents with  . Emesis    HISTORY OF PRESENT ILLNESS: Eric Gill  is a 65 y.o. male with a known history of status post kidney transplant, hyperparathyroidism, DVT, TIA, antiphospholipid syndrome, chronic Coumadin use, vascular dementia, hyperlipidemia- had frequent episodes of feeling dizzy and he follows with Dr. neurology Dr. Manuella Ghazi also- after multiple work up finally they came to agreement that maybe it was because of his labetalol and they advised. It few months ago since then he is doing fine. Today morning he complained having some abdominal pain and did not wanted to wake up at that time wife went out for her medical appointment and returned after 2 hours when he noted him sitting in the couch and there was a coffee-ground vomit on the floor and on his clothes he said that he fell down and had to cut out to the couch he also appeared somewhat confused to the wife but she took the vitals his blood pressure and heart rate were stable.  In ER he was noted to have drop in his hemoglobin, and vitals were stable. Patient has some baseline dementia and wife and daughter were present in the room they are not surprised by his episode of somewhat confusion as they had multiple times this thing done and workups were done so they are not worried about that. Patient also complained of noticing dark stool for last 3-4 days to his wife and as his hemoglobin noted significantly lowered to his previous hemoglobin in the past ER physician suggested to admit to medical services for that.  PAST MEDICAL HISTORY:   Past Medical History  Diagnosis Date  . Status post kidney transplant 2001  .  Hyperparathyroidism ([place])   . DVT (deep venous thrombosis) ([place])   . TIA (transient ischemic attack)   . Essential hypertension   . History of CMV   . Pedal edema   . Rosacea   . Antiphospholipid antibody syndrome ([place])   . Hyperlipidemia   . Vascular dementia without behavioral disturbance     PAST SURGICAL HISTORY: History reviewed. No pertinent past surgical history.  SOCIAL HISTORY:  Social History  Substance Use Topics  . Smoking status: Former Research scientist (life sciences)  . Smokeless tobacco: Not on file  . Alcohol Use: No    FAMILY HISTORY: No family history on file.  DRUG ALLERGIES:  Allergies  Allergen Reactions  . Statins Nausea And Vomiting and Other (See Comments)    Reaction:  Muscle pain     REVIEW OF SYSTEMS:   CONSTITUTIONAL: No fever, positive fatigue or weakness.  EYES: No blurred or double vision.  EARS, NOSE, AND THROAT: No tinnitus or ear pain.  RESPIRATORY: No cough, shortness of breath, wheezing or hemoptysis.  CARDIOVASCULAR: No chest pain, orthopnea, edema.  GASTROINTESTINAL: No nausea, have dark colored vomit , no diarrhea , had some epigastric abdominal pain.  GENITOURINARY: No dysuria, hematuria.  ENDOCRINE: No polyuria, nocturia,  HEMATOLOGY: No anemia, easy bruising or bleeding SKIN: No rash or lesion. MUSCULOSKELETAL: No joint pain or arthritis.   NEUROLOGIC: No tingling, numbness, weakness.  PSYCHIATRY: No anxiety or depression.   MEDICATIONS AT HOME:  Prior to Admission medications   Medication Sig  Start Date End Date Taking? Authorizing Provider  amLODipine (NORVASC) 5 MG tablet Take 5 mg by mouth daily at 12 noon.   Yes Historical Provider, MD  calcitRIOL (ROCALTROL) 0.25 MCG capsule Take 0.25 mcg by mouth at bedtime.   Yes Historical Provider, MD  citalopram (CELEXA) 10 MG tablet Take 10 mg by mouth at bedtime.   Yes Historical Provider, MD  COUMADIN 1 MG tablet Take 2-3 mg by mouth at bedtime. Pt takes 2mg  on Monday and Friday.   Pt takes 3mg  all  other days.   Pt is only able to use brand name.   Yes Historical Provider, MD  donepezil (ARICEPT) 10 MG tablet Take 10 mg by mouth at bedtime.   Yes Historical Provider, MD  furosemide (LASIX) 20 MG tablet Take 20 mg by mouth daily as needed for edema.    Yes Historical Provider, MD  labetalol (NORMODYNE) 200 MG tablet Take 200 mg by mouth 2 (two) times daily.    Yes Historical Provider, MD  losartan (COZAAR) 50 MG tablet Take 50 mg by mouth at bedtime.    Yes Historical Provider, MD  montelukast (SINGULAIR) 10 MG tablet Take 10 mg by mouth at bedtime.   Yes Historical Provider, MD  mycophenolate (CELLCEPT) 500 MG tablet Take 500 mg by mouth 2 (two) times daily.   Yes Historical Provider, MD  omeprazole (PRILOSEC) 20 MG capsule Take 40 mg by mouth at bedtime.    Yes Historical Provider, MD  predniSONE (DELTASONE) 5 MG tablet Take 5 mg by mouth daily.   Yes Historical Provider, MD  tacrolimus (PROGRAF) 0.5 MG capsule Take 0.5 mg by mouth at bedtime.    Yes Historical Provider, MD      PHYSICAL EXAMINATION:   VITAL SIGNS: Blood pressure 166/71, pulse 60, temperature 97.8 F (36.6 C), temperature source Oral, resp. rate 16, height 5\' 7"  (1.702 m), weight 65.772 kg (145 lb), SpO2 100 %.  GENERAL:  65 y.o.-year-old patient lying in the bed with no acute distress.  EYES: Pupils equal, round, reactive to light and accommodation. No scleral icterus. Extraocular muscles intact. Conjunctiva pale HEENT: Head atraumatic, normocephalic. Oropharynx and nasopharynx clear.  NECK:  Supple, no jugular venous distention. No thyroid enlargement, no tenderness.  LUNGS: Normal breath sounds bilaterally, no wheezing, rales,rhonchi or crepitation. No use of accessory muscles of respiration.  CARDIOVASCULAR: S1, S2 normal. No murmurs, rubs, or gallops.  ABDOMEN: Soft, nontender, nondistended. Bowel sounds present. No organomegaly or mass.  EXTREMITIES: No pedal edema, cyanosis, or clubbing.  NEUROLOGIC: Cranial  nerves II through XII are intact. Muscle strength 5/5 in all extremities. Sensation intact. Gait not checked.  PSYCHIATRIC: The patient is alert and oriented x 3.  SKIN: No obvious rash, lesion, or ulcer.   LABORATORY PANEL:   CBC  Recent Labs Lab 02/23/15 1612  WBC 8.4  HGB 7.3*  HCT 23.3*  PLT 130*  MCV 87.9  MCH 27.6  MCHC 31.4*  RDW 14.0   ------------------------------------------------------------------------------------------------------------------  Chemistries   Recent Labs Lab 02/23/15 1612  NA 143  K 4.4  CL 110  CO2 25  GLUCOSE 104*  BUN 65*  CREATININE 2.19*  CALCIUM 9.5  AST 19  ALT 13*  ALKPHOS 64  BILITOT 0.6   ------------------------------------------------------------------------------------------------------------------ estimated creatinine clearance is 30.9 mL/min (by C-G formula based on Cr of 2.19). ------------------------------------------------------------------------------------------------------------------ No results for input(s): TSH, T4TOTAL, T3FREE, THYROIDAB in the last 72 hours.  Invalid input(s): FREET3   Coagulation profile  Recent Labs Lab  02/23/15 1612  INR 2.92   ------------------------------------------------------------------------------------------------------------------- No results for input(s): DDIMER in the last 72 hours. -------------------------------------------------------------------------------------------------------------------  Cardiac Enzymes No results for input(s): CKMB, TROPONINI, MYOGLOBIN in the last 168 hours.  Invalid input(s): CK ------------------------------------------------------------------------------------------------------------------ Invalid input(s): POCBNP  ---------------------------------------------------------------------------------------------------------------  Urinalysis    Component Value Date/Time   COLORURINE YELLOW* 02/23/2015 1722   APPEARANCEUR CLEAR*  02/23/2015 1722   LABSPEC 1.015 02/23/2015 1722   PHURINE 5.0 02/23/2015 1722   GLUCOSEU NEGATIVE 02/23/2015 1722   HGBUR 1+* 02/23/2015 1722   BILIRUBINUR NEGATIVE 02/23/2015 1722   KETONESUR NEGATIVE 02/23/2015 1722   PROTEINUR NEGATIVE 02/23/2015 1722   NITRITE NEGATIVE 02/23/2015 1722   LEUKOCYTESUR NEGATIVE 02/23/2015 1722     RADIOLOGY: No results found.  EKG: Orders placed or performed during the hospital encounter of 02/23/15  . EKG 12-Lead  . EKG 12-Lead    IMPRESSION AND PLAN:  * Acute blood loss anemia secondary to most likely upper GI bleed  Hemoglobin is 7.3 significantly low compared to the previous one.  We will continue monitoring and if any further drop in might have to transfuse blood, I discussed about this with patient's wife and daughter and they agreed with that.  Patient had some complaint of burning in the stomach, and reflux- but wife and daughter denies any use of NSAIDs or aspirin as he had renal transplant.  The endoscopy done, I will call GI consult for further management.  Keep him nothing by mouth except meds for now, give IV fluid, give PPI twice a day.  Hold his Coumadin and any other anti-coagulation at this time.  * Status post kidney transplant His renal function is stable and he is on multiple transplant medications.  I will call nephrology consult to follow along with [place] while he is in the hospital.  * Hypertension  Continue amlodipine for now and follow his blood pressure.  As taken off from labetalol because of frequent episodes of dizziness.  * Antiphospholipid syndrome  He also had history of blood clots in the past and TIAs and he was advised to take Coumadin.  INR is therapeutic currently, but due to GI bleed I would hold Coumadin for now  I explained patient's wife and daughter in the room that this might increase the risk of having blood clots slightly while he is not on anticoagulant but he requires to stop that at this point  because of active bleeding.  We might help to get opinion of GI or if needed the hematoma oncologist at the time of discharge to restart his Coumadin.  * TIA and vascular dementia  Continue home medications.  All the records are reviewed and case discussed with ED provider. Management plans discussed with the patient, family and they are in agreement.  CODE STATUS: Full code Code Status History    This patient does not have a recorded code status. Please follow your organizational policy for patients in this situation.       TOTAL TIME TAKING CARE OF THIS PATIENT: 50 minutes.    Eric Gill M.D on 02/23/2015   Between 7am to 6pm - Pager - [phone]  After 6pm go to [email or web address] - password [email or web address]  Office  [phone]  CC: Primary care physician; Idelle Crouch, MD   Note: This dictation was prepared with Dragon dictation along with smaller phrase technology. Any transcriptional errors that result from this process are unintentional.

## 2015-02-23 NOTE — ED Provider Notes (Signed)
[facility] Emergency Department Provider Note   ____________________________________________  Time seen: 1645  I have reviewed the triage vital signs and the nursing notes.   HISTORY  Chief Complaint Emesis   History limited by: Not Limited   HPI Eric Gill is a 65 y.o. male who presents to the emergency department today because of concern for dark emesis and stool. The patient had an episode today of weakness, diarrhea and vomiting. Family states that these symptoms are not completely unusual for the patient, and he has been seen by cardiology for similar episodes, which appear to be attributed to a brief fall in blood pressure. However today there was concern given the dark black emesis and dark stools. They have noticed dark stools for the past 3-4 days, although they have been contributing it to a medication change. The patient is on warfarin.    Past Medical History  Diagnosis Date  . Status post kidney transplant 2001  . Hyperparathyroidism ([place])   . DVT (deep venous thrombosis) ([place])   . TIA (transient ischemic attack)   . Essential hypertension   . History of CMV   . Pedal edema   . Rosacea   . Antiphospholipid antibody syndrome ([place])   . Hyperlipidemia   . 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   History reviewed. No pertinent past surgical history.  Current Outpatient Rx  Name  Route  Sig  Dispense  Refill  . amoxicillin (AMOXIL) 875 MG tablet   Oral   Take 875 mg by mouth 2 (two) times daily as needed (dental procedures).          [name] buPROPion (WELLBUTRIN XL) 300 MG 24 hr tablet   Oral   Take 300 mg by mouth at bedtime.         . calcitRIOL (ROCALTROL) 0.25 MCG capsule   Oral   Take 0.25 mcg by mouth at bedtime.         . citalopram (CELEXA) 20 MG tablet   Oral   Take 10 mg by mouth at bedtime.         [name] COUMADIN 1 MG tablet   Oral   Take  3 mg by mouth at bedtime. Brand name only           Dispense as written.   . donepezil (ARICEPT) 10 MG tablet   Oral   Take 10 mg by mouth at bedtime.         . furosemide (LASIX) 20 MG tablet   Oral   Take 20 mg by mouth daily as needed for edema.          [name] labetalol (NORMODYNE) 200 MG tablet   Oral   Take 400 mg by mouth 2 (two) times daily.         [name] losartan (COZAAR) 25 MG tablet   Oral   Take 25 mg by mouth at bedtime.         . mycophenolate (CELLCEPT) 500 MG tablet   Oral   Take 500 mg by mouth 2 (two) times daily.         [name] omeprazole (PRILOSEC) 20 MG capsule   Oral   Take 40 mg by mouth at bedtime.         . predniSONE (DELTASONE) 5 MG tablet   Oral   Take 5 mg by mouth daily.         [name]  tacrolimus (PROGRAF) 0.5 MG capsule   Oral   Take 0.5 mg by mouth 2 (two) times daily.           Allergies Lipitor and Statins  No family history on file.  Social History Social History  Substance Use Topics  . Smoking status: Former Research scientist (life sciences)  . Smokeless tobacco: None  . Alcohol Use: No    Review of Systems  Constitutional: Negative for fever. Cardiovascular: Negative for chest pain. Respiratory: Negative for shortness of breath. Gastrointestinal: Negative for abdominal pain. Positive for vomiting and diarrhea.  Genitourinary: Negative for dysuria. Neurological: Negative for headaches, focal weakness or numbness.   10-point ROS otherwise negative.  ____________________________________________   PHYSICAL EXAM:  VITAL SIGNS: ED Triage Vitals  Enc Vitals Group     BP 02/23/15 1558 166/71 mmHg     Pulse Rate 02/23/15 1558 60     Resp 02/23/15 1558 16     Temp 02/23/15 1558 97.8 F (36.6 C)     Temp Source 02/23/15 1558 Oral     SpO2 02/23/15 1558 100 %     Weight 02/23/15 1558 145 lb (65.772 kg)     Height 02/23/15 1558 5\' 7"  (1.702 m)     Head Cir --      Peak Flow --      Pain Score 02/23/15 1605 0   Constitutional: Alert and  oriented. Well appearing and in no distress. Eyes: Conjunctivae are normal. PERRL. Normal extraocular movements. ENT   Head: Normocephalic and atraumatic.   Nose: No congestion/rhinnorhea.   Mouth/Throat: Mucous membranes are moist.   Neck: No stridor. Hematological/Lymphatic/Immunilogical: No cervical lymphadenopathy. Cardiovascular: Normal rate, regular rhythm.  No murmurs, rubs, or gallops. Respiratory: Normal respiratory effort without tachypnea nor retractions. Breath sounds are clear and equal bilaterally. No wheezes/rales/rhonchi. Gastrointestinal: Soft and nontender. No distention. There is no CVA tenderness. GUIAC positive. Melenotic stool on the glove.  Genitourinary: Deferred Musculoskeletal: Normal range of motion in all extremities. No joint effusions.  No lower extremity tenderness nor edema. Neurologic:  Normal speech and language. No gross focal neurologic deficits are appreciated.  Skin:  Skin is warm, dry and intact. No rash noted. Psychiatric: Mood and affect are normal. Speech and behavior are normal. Patient exhibits appropriate insight and judgment.  ____________________________________________    LABS (pertinent positives/negatives)  Labs Reviewed  COMPREHENSIVE METABOLIC PANEL - Abnormal; Notable for the following:    Glucose, Bld 104 (*)    BUN 65 (*)    Creatinine, Ser 2.19 (*)    Total Protein 5.4 (*)    Albumin 3.1 (*)    ALT 13 (*)    GFR calc non Af Amer 30 (*)    GFR calc Af Amer 34 (*)    All other components within normal limits  CBC - Abnormal; Notable for the following:    RBC 2.65 (*)    Hemoglobin 7.3 (*)    HCT 23.3 (*)    MCHC 31.4 (*)    Platelets 130 (*)    All other components within normal limits  PROTIME-INR - Abnormal; Notable for the following:    Prothrombin Time 30.0 (*)    All other components within normal limits  LIPASE, BLOOD  URINALYSIS COMPLETEWITH MICROSCOPIC ([facility] ONLY)  TYPE AND SCREEN      ____________________________________________   EKG  I, Nance Pear, attending physician, personally viewed and interpreted this EKG  EKG Time: 1603 Rate: 59 Rhythm: sinus bradycardia Axis: normal Intervals: qtc 444 QRS: narrow  ST changes: no st elevation Impression: abnormal ekg ____________________________________________    RADIOLOGY  None   ____________________________________________   PROCEDURES  Procedure(s) performed: None  Critical Care performed: No  ____________________________________________   INITIAL IMPRESSION / ASSESSMENT AND PLAN / ED COURSE  Pertinent labs & imaging results that were available during my care of the patient were reviewed by me and considered in my medical decision making (see chart for details).  Patient presented to the emergency department today because of concern for dark emesis and diarrhea. Hgb was much lower today than baseline, however over 7. GUIAC was positive and melenotic stool was noted on the glove. Will plan on admission to the hospitalist service.   ____________________________________________   FINAL CLINICAL IMPRESSION(S) / ED DIAGNOSES  Final diagnoses:  Anemia, unspecified anemia type  Gastrointestinal hemorrhage with melena     Nance Pear, MD 02/23/15 1711

## 2015-02-24 LAB — BASIC METABOLIC PANEL
Anion gap: 6 (ref 5–15)
BUN: 51 mg/dL — ABNORMAL HIGH (ref 6–20)
CALCIUM: 8.5 mg/dL — AB (ref 8.9–10.3)
CO2: 24 mmol/L (ref 22–32)
CREATININE: 2.16 mg/dL — AB (ref 0.61–1.24)
Chloride: 111 mmol/L (ref 101–111)
GFR calc non Af Amer: 30 mL/min — ABNORMAL LOW (ref >60)
GFR, EST AFRICAN AMERICAN: 35 mL/min — AB (ref >60)
GLUCOSE: 88 mg/dL (ref 65–99)
Potassium: 3.7 mmol/L (ref 3.5–5.1)
Sodium: 141 mmol/L (ref 135–145)

## 2015-02-24 LAB — CBC
HEMATOCRIT: 22.2 % — AB (ref 40.0–52.0)
HEMOGLOBIN: 7.2 g/dL — AB (ref 13.0–18.0)
MCH: 28.2 pg (ref 26.0–34.0)
MCHC: 32.2 g/dL (ref 32.0–36.0)
MCV: 87.7 fL (ref 80.0–100.0)
PLATELETS: 120 10*3/uL — AB (ref 150–440)
RBC: 2.54 MIL/uL — ABNORMAL LOW (ref 4.40–5.90)
RDW: 13.8 % (ref 11.5–14.5)
WBC: 5.9 10*3/uL (ref 3.8–10.6)

## 2015-02-24 LAB — PROTIME-INR
INR: 2.65
INR: 2.48
PROTHROMBIN TIME: 27.9 s — AB (ref 11.4–15.0)
PROTHROMBIN TIME: 26.5 s — AB (ref 11.4–15.0)

## 2015-02-24 LAB — HEMOGLOBIN
HEMOGLOBIN: 6.2 g/dL — AB (ref 13.0–18.0)
HEMOGLOBIN: 7.4 g/dL — AB (ref 13.0–18.0)

## 2015-02-24 LAB — PREPARE RBC (CROSSMATCH)

## 2015-02-24 MED ORDER — SODIUM CHLORIDE 0.9 % IV SOLN
Freq: Once | INTRAVENOUS | Status: AC
  Administered 2015-02-24: 05:00:00 via INTRAVENOUS

## 2015-02-24 MED ORDER — ONDANSETRON HCL 4 MG PO TABS
4.0000 mg | ORAL_TABLET | Freq: Three times a day (TID) | ORAL | Status: DC | PRN
Start: 2015-02-24 — End: 2015-03-06
  Administered 2015-02-24: 12:00:00 4 mg via ORAL
  Filled 2015-02-24: qty 1

## 2015-02-24 NOTE — Plan of Care (Signed)
Problem: Nutrition: Goal: Adequate nutrition will be maintained Outcome: Not Applicable Date Met:  68/12/75 Pt currently NPO except medications. GI consult pending.   Problem: Bowel/Gastric: Goal: Will show no signs and symptoms of gastrointestinal bleeding Outcome: Not Progressing Pt on timed hemoglobin's. Last result was 6.2. MD made aware. Order given to transfuse 2 units of prbc's. Awaiting units to be prepared by the blood bank for administration. Pt given education on blood transfusions. Pt verbalized understanding prior to giving consent.

## 2015-02-24 NOTE — Care Management (Signed)
Admitted to [facility] with the diagnosis of GI Bleed. Lives with wife, Rodena Piety [phone] or [phone]). Last seen Dr. Doy Hutching the middle of December. No Home Health. No skilled facility. No home oxygen. Uses no aids for ambulation. Fell yesterday. Usually a good appetite. No seizure. No dialysis. Kidney transplant 2001. Followed by Dr. Livingston Diones at [facility]. Takes care of all basic and instrumental activities of daily living himself, drives. Wife will transport. Shelbie Ammons RN MSN CCM Care Management [phone]

## 2015-02-24 NOTE — Consult Note (Signed)
GI Inpatient Consult Note  Reason for Consult: GI Bleed   Attending Requesting Consult: Posey Pronto  History of Present Illness: Eric Gill is a 65 y.o. male with a history of DVT and TIA likely d/t antiphospholipid syndrome, chronic anticoagulation with Coumadin, vascular dementia, hyperlipidemia, s/p kidney transplant, and hyperparathyroidism a/w a suspected upper GI bleed.  He presented to the [facility] ED on 02/23/15 after his wife came home and noticed he was sitting on the couch and there was a coffee-ground emesis on the floor and on his clothes.  He also had new bruising that was not present prior to her leaving the house, and it is believed that he also had a fall.   He also reported 3-4 days of black, tarry, loose stools.  In the ED, labs were as follows: INR 2.92, HgB 7.3, MCV 87.9, Plt 130, and Cr 2.19 (close to baseline).    During admission, Hgb was 6.2 overnight.  After 1 unit PRBCs, Hgb improved to 7.2.  He is scheduled to receive another unit packed RBCs this morning (1/17). He was also started on Protonix 40 mg BID.  Today, Eric Gill denies any abdominal pain, nausea, additional episodes of emesis, or melena.  He also denies any CP or SOB.  Also no NSAID use.    At baseline he has loose stools, but this is the first time he remembers having melena. The episode of coffee ground emesis on 1/16 was also the first episode that he can remember.  His wife and daughter were present in the room, and stated that he has not felt well in several months - he has been more fatigued than normal. His daughter also noted that he had blood work done a few weeks ago and his HgB at that time was approximately 12.   Of note, patient's last dose was Sunday evening.  INR today was 2.6.  Last colonoscopy: approximately 5 years ago d/t rectal bleeding, "internal hemorrhoids" per patient's wife  Past Medical History:  Past Medical History  Diagnosis Date  . Status post kidney transplant 2001  .  Hyperparathyroidism ([place])   . DVT (deep venous thrombosis) ([place])   . TIA (transient ischemic attack)   . Essential hypertension   . History of CMV   . Pedal edema   . Rosacea   . Antiphospholipid antibody syndrome ([place])   . Hyperlipidemia   . Vascular dementia without behavioral disturbance     Problem List: Patient Active Problem List   Diagnosis Date Noted  . GI bleed 02/23/2015  . Acute blood loss anemia 02/23/2015    Past Surgical History: History reviewed. No pertinent past surgical history.  Allergies: Allergies  Allergen Reactions  . Statins Nausea And Vomiting and Other (See Comments)    Reaction:  Muscle pain     Home Medications: Prescriptions prior to admission  Medication Sig Dispense Refill Last Dose  . amLODipine (NORVASC) 5 MG tablet Take 5 mg by mouth daily at 12 noon.   02/23/2015 at Unknown time  . calcitRIOL (ROCALTROL) 0.25 MCG capsule Take 0.25 mcg by mouth at bedtime.   02/22/2015 at Unknown time  . citalopram (CELEXA) 10 MG tablet Take 10 mg by mouth at bedtime.   02/22/2015 at Unknown time  . COUMADIN 1 MG tablet Take 2-3 mg by mouth at bedtime. Pt takes 2mg  on Monday and Friday.   Pt takes 3mg  all other days.   Pt is only able to use brand name.   02/22/2015 at  2300  . donepezil (ARICEPT) 10 MG tablet Take 10 mg by mouth at bedtime.   02/22/2015 at Unknown time  . furosemide (LASIX) 20 MG tablet Take 20 mg by mouth daily as needed for edema.    PRN at PRN  . labetalol (NORMODYNE) 200 MG tablet Take 200 mg by mouth 2 (two) times daily.    02/23/2015 at 1030  . losartan (COZAAR) 50 MG tablet Take 50 mg by mouth at bedtime.    02/22/2015 at Unknown time  . montelukast (SINGULAIR) 10 MG tablet Take 10 mg by mouth at bedtime.   02/22/2015 at Unknown time  . mycophenolate (CELLCEPT) 500 MG tablet Take 500 mg by mouth 2 (two) times daily.   02/23/2015 at Unknown time  . omeprazole (PRILOSEC) 20 MG capsule Take 40 mg by mouth at bedtime.    02/22/2015 at Unknown time   . predniSONE (DELTASONE) 5 MG tablet Take 5 mg by mouth daily.   02/23/2015 at Unknown time  . tacrolimus (PROGRAF) 0.5 MG capsule Take 0.5 mg by mouth at bedtime.    02/22/2015 at Unknown time   Home medication reconciliation was completed with the patient.   Scheduled Inpatient Medications:   . amLODipine  5 mg Oral Q1200  . calcitRIOL  0.25 mcg Oral QHS  . citalopram  10 mg Oral QHS  . donepezil  10 mg Oral QHS  . losartan  50 mg Oral QHS  . montelukast  10 mg Oral QHS  . mycophenolate  500 mg Oral BID  . pantoprazole (PROTONIX) IV  40 mg Intravenous Q12H  . predniSONE  5 mg Oral Daily  . tacrolimus  0.5 mg Oral QHS    Continuous Inpatient Infusions:   . sodium chloride 75 mL/hr at 02/23/15 2042    PRN Inpatient Medications:    Family History: The patient's family history is negative for inflammatory bowel disorders, GI malignancy, or solid organ transplantation.  Social History:   reports that he has quit smoking. He does not have any smokeless tobacco history on file. He reports that he does not drink alcohol.  Review of Systems: Constitutional: Weight is stable.  Eyes: No changes in vision. ENT: No oral lesions, sore throat.  GI: see HPI.  Heme/Lymph: No easy bruising.  CV: No chest pain.  GU: No hematuria.  Integumentary: No rashes.  Neuro: No headaches.  Psych: No depression/anxiety.  Endocrine: No heat/cold intolerance.  Allergic/Immunologic: No urticaria.  Resp: No cough, SOB.  Musculoskeletal: No joint swelling.    Physical Examination: BP 161/63 mmHg  Pulse 59  Temp(Src) 98.3 F (36.8 C) (Oral)  Resp 20  Ht 5\' 7"  (1.702 m)  Wt 64.774 kg (142 lb 12.8 oz)  BMI 22.36 kg/m2  SpO2 98% Gen: Pleasant caucasian male in NAD, alert and oriented x 4, exhibits some difficulty with short-term memory HEENT: PEERLA, EOMI Neck: supple, no JVD or thyromegaly Chest: CTA bilaterally, no wheezes, crackles, or other adventitious sounds CV: RRR, no m/g/c/r Abd:  soft, NT, ND, +BS in all four quadrants; no HSM, guarding, ridigity, or rebound tenderness Ext: no edema, well perfused with 2+ pulses, Skin: no rash or lesions noted Lymph: no LAD  Data: Lab Results  Component Value Date   WBC 5.9 02/24/2015   HGB 7.2* 02/24/2015   HCT 22.2* 02/24/2015   MCV 87.7 02/24/2015   PLT 120* 02/24/2015    Recent Labs Lab 02/23/15 1958 02/24/15 0213 02/24/15 0854  HGB 7.0* 6.2* 7.2*   Lab Results  Component Value Date   NA 143 02/23/2015   K 4.4 02/23/2015   CL 110 02/23/2015   CO2 25 02/23/2015   BUN 65* 02/23/2015   CREATININE 2.19* 02/23/2015   Lab Results  Component Value Date   ALT 13* 02/23/2015   AST 19 02/23/2015   ALKPHOS 64 02/23/2015   BILITOT 0.6 02/23/2015    Recent Labs Lab 02/23/15 1612  INR 2.92   Assessment/Plan: Eric Gill is a 65 y.o. male known history of status post kidney transplant, hyperparathyroidism, DVT, TIA, antiphospholipid syndrome, chronic Coumadin use, vascular dementia, hyperlipidemia, and frequent episodes of feeling dizzy (follows with Dr. Manuella Ghazi, neurology).  Hgb was 7.3 in the ED, then dropped to 6.2 overnight.  After one unit PRBCs, Hgb improved minimally to 7.2.  Suspect an UGI bleed exacerbated by coumadin use.  I recommend inpatient EGD to further evaluate, and will also draw H pylori serology with morning labs.  INR is 2.6 currently, so EGD can occur after INR drops below 2.  When INR is sub-therapeutic, recommend initiating a heparin bridge d/t increased risk of thrombosis; this should be discontinued 6 hours prior to EGD.  Will continue to follow Hgb and INR, as well as symptoms.  Further recommendations regarding EGD per Dr. Rayann Heman.  Recommendations: - Monitor Hgb, transfuse if <7 - H pylori serology w/ am labs - Recommend EGD after INR <2.0 - Recommend heparin bridge when INR <2.0 d/t antiphospholipid syndrome - Further recs per Dr. Rayann Heman  Thank you for the consult. We will follow along with you.  Please call with questions or concerns.  Lavera Guise, PA-C [facility] Phone: [phone] Pager: [phone]

## 2015-02-24 NOTE — Progress Notes (Signed)
[place] [facility] [place] NAME: Eric Gill    MR#:  [id number]  DATE OF BIRTH:  November 13, 1950  SUBJECTIVE:   Came in after having coffee ground emesis at home and dark colored stools for last 3-4 days. Found to have hgb of 6.2 REVIEW OF SYSTEMS:   Review of Systems  Constitutional: Negative for fever, chills and weight loss.  HENT: Negative for ear discharge, ear pain and nosebleeds.   Eyes: Negative for blurred vision, pain and discharge.  Respiratory: Negative for sputum production, shortness of breath, wheezing and stridor.   Cardiovascular: Negative for chest pain, palpitations, orthopnea and PND.  Gastrointestinal: Positive for nausea and blood in stool. Negative for vomiting, abdominal pain and diarrhea.  Genitourinary: Negative for urgency and frequency.  Musculoskeletal: Negative for back pain and joint pain.  Neurological: Negative for sensory change, speech change, focal weakness and weakness.  Psychiatric/Behavioral: Negative for depression and hallucinations. The patient is not nervous/anxious.   All other systems reviewed and are negative.  Tolerating Diet:npo Tolerating PT: not needed  DRUG ALLERGIES:   Allergies  Allergen Reactions  . Statins Nausea And Vomiting and Other (See Comments)    Reaction:  Muscle pain     VITALS:  Blood pressure 164/71, pulse 69, temperature 98 F (36.7 C), temperature source Oral, resp. rate 20, height 5\' 7"  (1.702 m), weight 64.774 kg (142 lb 12.8 oz), SpO2 98 %.  PHYSICAL EXAMINATION:   Physical Exam  GENERAL:  65 y.o.-year-old patient lying in the bed with no acute distress. Pallor+ EYES: Pupils equal, round, reactive to light and accommodation. No scleral icterus. Extraocular muscles intact.  HEENT: Head atraumatic, normocephalic. Oropharynx and nasopharynx clear.  NECK:  Supple, no jugular venous distention. No thyroid enlargement, no tenderness.  LUNGS: Normal breath sounds  bilaterally, no wheezing, rales, rhonchi. No use of accessory muscles of respiration.  CARDIOVASCULAR: S1, S2 normal. No murmurs, rubs, or gallops.  ABDOMEN: Soft, nontender, nondistended. Bowel sounds present. No organomegaly or mass.  EXTREMITIES: No cyanosis, clubbing or edema b/l.    NEUROLOGIC: Cranial nerves II through XII are intact. No focal Motor or sensory deficits b/l.   PSYCHIATRIC: The patient is alert and oriented x 3.  SKIN: No obvious rash, lesion, or ulcer.   LABORATORY PANEL:  CBC  Recent Labs Lab 02/24/15 0854 02/24/15 1638  WBC 5.9  --   HGB 7.2* 7.4*  HCT 22.2*  --   PLT 120*  --     Chemistries   Recent Labs Lab 02/23/15 1612 02/24/15 0854  NA 143 141  K 4.4 3.7  CL 110 111  CO2 25 24  GLUCOSE 104* 88  BUN 65* 51*  CREATININE 2.19* 2.16*  CALCIUM 9.5 8.5*  AST 19  --   ALT 13*  --   ALKPHOS 64  --   BILITOT 0.6  --    ASSESSMENT AND PLAN:  Eric Gill is a 65 y.o. male with a known history of status post kidney transplant, hyperparathyroidism, DVT, TIA, antiphospholipid syndrome, chronic Coumadin use, vascular dementia, hyperlipidemia came in with coffee ground vomitus  * Acute blood loss anemia secondary to most likely upper GI bleed Hemoglobin is 7.3--67.0--6.2--1 unit BT--7.4  -Patient had some complaint of burning in the stomach, and reflux- but wife and daughter denies any use of NSAIDs or aspirin as he had renal transplant. -cont IV fluid, give PPI twice a day. -Hold his Coumadin and any other anti-coagulation at this  time.  * Status post kidney transplant His renal function is stable and he is on multiple transplant medications. -appreciate nephrology input  * Hypertension Continue amlodipine for now and follow his blood pressure. Pt has been taken off from labetalol because of frequent episodes of dizziness.  * Antiphospholipid syndrome -He also had history of blood clots in the past and TIAs and he was advised to take  Coumadin. -INR is therapeutic currently, but due to GI bleed  would hold Coumadin for now - patient's wife and daughter in the room that this might increase the risk of having blood clots slightly while he is not on anticoagulant but he requires to stop that at this point because of active bleeding.  * TIA and vascular dementia Continue home medications.  Management plans discussed with the patient, family and they are in agreement.  Case discussed with Care Management/Social Worker. Management plans discussed with the patient, family and they are in agreement.  CODE STATUS:full  DVT Prophylaxis: SCD  TOTAL TIME TAKING CARE OF THIS PATIENT: 30 minutes.  >50% time spent on counselling and coordination of care  POSSIBLE D/C IN 1-2 DAYS, DEPENDING ON CLINICAL CONDITION.  Note: This dictation was prepared with Dragon dictation along with smaller phrase technology. Any transcriptional errors that result from this process are unintentional.  Eric Gill M.D on 02/24/2015 at 5:45 PM  Between 7am to 6pm - Pager - [phone]  After 6pm go to [email or web address] - password [email or web address] [place]  Office  [phone]  CC: Primary care physician; [place] Crouch, MD

## 2015-02-24 NOTE — Consult Note (Signed)
[facility] CONSULT NOTE    Date: 02/24/2015                  Patient Name:  Eric Gill  MRN: [id number]  DOB: 1950/06/22  Age / Sex: 65 y.o., male         PCP: [place] Crouch, MD                 Service Requesting Consult: Dr. Marthann Schiller                 Reason for Consult: Management of renal transplantation            History of Present Illness: [name] a 65 y.o. 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   [name] followed at [facility] nephrology for management of his renal transplantation. As above he received a renal transplant in 2001 from his wife. His baseline creatinine is 2.2 from December 2016.  He takes tacrolimus, prednisone, and CellCept for management of his renal transportation and immunosuppression. He is also known to be on Coumadin for antiphospholipid syndrome. His INR was found to be 2.92 yesterday.   Medications: Outpatient medications: Prescriptions prior to admission  Medication Sig Dispense Refill Last Dose  . amLODipine (NORVASC) 5 MG tablet Take 5 mg by mouth daily at 12 noon.   02/23/2015 at Unknown time  . calcitRIOL (ROCALTROL) 0.25 MCG capsule Take 0.25 mcg by mouth at bedtime.   02/22/2015 at Unknown time  . citalopram (CELEXA) 10 MG tablet Take 10 mg by mouth at bedtime.   02/22/2015 at Unknown time  . COUMADIN 1 MG tablet Take 2-3 mg by mouth at bedtime. Pt takes 2mg  on Monday and Friday.   Pt takes 3mg  all other days.   Pt is only able to use brand name.   02/22/2015 at 2300  . donepezil (ARICEPT) 10 MG tablet Take 10 mg by mouth at bedtime.    02/22/2015 at Unknown time  . furosemide (LASIX) 20 MG tablet Take 20 mg by mouth daily as needed for edema.    PRN at PRN  . labetalol (NORMODYNE) 200 MG tablet Take 200 mg by mouth 2 (two) times daily.    02/23/2015 at 1030  . losartan (COZAAR) 50 MG tablet Take 50 mg by mouth at bedtime.    02/22/2015 at Unknown time  . montelukast (SINGULAIR) 10 MG tablet Take 10 mg by mouth at bedtime.   02/22/2015 at Unknown time  . mycophenolate (CELLCEPT) 500 MG tablet Take 500 mg by mouth 2 (two) times daily.   02/23/2015 at Unknown time  . omeprazole (PRILOSEC) 20 MG capsule Take 40 mg by mouth at bedtime.    02/22/2015 at Unknown time  . predniSONE (DELTASONE) 5 MG tablet Take 5 mg by mouth daily.   02/23/2015 at Unknown time  . tacrolimus (PROGRAF) 0.5 MG capsule Take 0.5 mg by mouth at bedtime.    02/22/2015 at Unknown time    Current medications: Current Facility-Administered Medications  Medication Dose Route Frequency Provider Last Rate Last Dose  . 0.9 %  sodium chloride infusion   Intravenous Continuous Vaughan Basta, MD 75 mL/hr at 02/23/15 2042    .  amLODipine (NORVASC) tablet 5 mg  5 mg Oral Q1200 Vaughan Basta, MD      . calcitRIOL (ROCALTROL) capsule 0.25 mcg  0.25 mcg Oral QHS Vaughan Basta, MD   0.25 mcg at 02/23/15 2100  . citalopram (CELEXA) tablet 10 mg  10 mg Oral QHS Vaughan Basta, MD   10 mg at 02/23/15 2100  . donepezil (ARICEPT) tablet 10 mg  10 mg Oral QHS Vaughan Basta, MD   10 mg at 02/23/15 2100  . losartan (COZAAR) tablet 50 mg  50 mg Oral QHS Vaughan Basta, MD   50 mg at 02/23/15 2100  . montelukast (SINGULAIR) tablet 10 mg  10 mg Oral QHS Vaughan Basta, MD   10 mg at 02/23/15 2109  . mycophenolate (CELLCEPT) capsule 500 mg  500 mg Oral BID Vaughan Basta, MD   500 mg at 02/23/15 2100  . pantoprazole (PROTONIX) injection 40 mg  40 mg Intravenous Q12H Vaughan Basta, MD   40 mg at 02/24/15 0930  . predniSONE  (DELTASONE) tablet 5 mg  5 mg Oral Daily Vaughan Basta, MD   5 mg at 02/23/15 2109  . tacrolimus (PROGRAF) capsule 0.5 mg  0.5 mg Oral QHS Vaughan Basta, MD   0.5 mg at 02/23/15 2100      Allergies: Allergies  Allergen Reactions  . Statins Nausea And Vomiting and Other (See Comments)    Reaction:  Muscle pain       Past Medical History: Past Medical History  Diagnosis Date  . Status post kidney transplant 2001  . Hyperparathyroidism ([place])   . DVT (deep venous thrombosis) ([place])   . TIA (transient ischemic attack)   . Essential hypertension   . History of CMV   . Pedal edema   . Rosacea   . Antiphospholipid antibody syndrome ([place])   . Hyperlipidemia   . Vascular dementia without behavioral disturbance      Past Surgical History: S/p renal transplant in 2001, wife was the donor.  Family History: . Hypertension Mother  . Kidney disease Mother  . Hypertension Father  . Coronary artery disease Father  . Diabetes mellitus Sister  . COPD Brother  . Hypertension Brother  . Hypertension Sister  . Hypertension Daughter  . Migraines Daughter  . Cancer Maternal Grandmother  . ALS Paternal Grandfather     Social History: Social History   Social History  . Marital Status: Married    Spouse Name: N/A  . Number of Children: N/A  . Years of Education: N/A   Occupational History  . Not on file.   Social History Main Topics  . Smoking status: Former Research scientist (life sciences)  . Smokeless tobacco: Not on file  . Alcohol Use: No  . Drug Use: Not on file  . Sexual Activity: Not on file   Other Topics Concern  . Not on file   Social History Narrative     Review of Systems: As per HPI  Vital Signs: Blood pressure 161/63, pulse 59, temperature 98.3 F (36.8 C), temperature source Oral, resp. rate 20, height 5\' 7"  (1.702 m), weight 64.774 kg (142 lb 12.8 oz), SpO2 98 %.  Weight trends: Filed Weights   02/23/15 1558 02/23/15 1953  Weight: 65.772 kg (145 lb)  64.774 kg (142 lb 12.8 oz)    Physical Exam: General: NAD, sitting up in bed  Head: Normocephalic, atraumatic.  Eyes: Anicteric, EOMI  Nose: Mucous membranes moist, not inflammed, nonerythematous.  Throat: Oropharynx nonerythematous, no exudate appreciated.   Neck: Supple, trachea  midline.  Lungs:  Normal respiratory effort. Clear to auscultation BL without crackles or wheezes.  Heart: RRR. S1 and S2 normal without gallop, murmur, or rubs.  Abdomen:  BS normoactive. Soft, Nondistended, non-tender.  No masses or organomegaly.  Extremities: No pretibial edema.  Neurologic: A&O X3, Motor strength is 5/5 in the all 4 extremities  Skin: No visible rashes, scars.    Lab results: Basic Metabolic Panel:  Recent Labs Lab 02/23/15 1612 02/24/15 0854  NA 143 141  K 4.4 3.7  CL 110 111  CO2 25 24  GLUCOSE 104* 88  BUN 65* 51*  CREATININE 2.19* 2.16*  CALCIUM 9.5 8.5*    Liver Function Tests:  Recent Labs Lab 02/23/15 1612  AST 19  ALT 13*  ALKPHOS 64  BILITOT 0.6  PROT 5.4*  ALBUMIN 3.1*    Recent Labs Lab 02/23/15 1612  LIPASE 34   No results for input(s): AMMONIA in the last 168 hours.  CBC:  Recent Labs Lab 02/23/15 1612 02/23/15 1958 02/24/15 0213 02/24/15 0854  WBC 8.4  --   --  5.9  HGB 7.3* 7.0* 6.2* 7.2*  HCT 23.3*  --   --  22.2*  MCV 87.9  --   --  87.7  PLT 130*  --   --  120*    Cardiac Enzymes: No results for input(s): CKTOTAL, CKMB, CKMBINDEX, TROPONINI in the last 168 hours.  BNP: Invalid input(s): POCBNP  CBG: No results for input(s): GLUCAP in the last 168 hours.  Microbiology: Results for orders placed or performed during the hospital encounter of 10/29/14  Urine culture     Status: None   Collection Time: 10/29/14  1:01 PM  Result Value Ref Range Status   Specimen Description URINE, RANDOM  Final   Special Requests NONE  Final   Culture 7,000 COLONIES/mL INSIGNIFICANT GROWTH  Final   Report Status 10/31/2014 FINAL  Final     Coagulation Studies:  Recent Labs  02/23/15 1612  LABPROT 30.0*  INR 2.92    Urinalysis:  Recent Labs  02/23/15 1722  COLORURINE YELLOW*  LABSPEC 1.015  PHURINE 5.0  GLUCOSEU NEGATIVE  HGBUR 1+*  BILIRUBINUR NEGATIVE  KETONESUR NEGATIVE  PROTEINUR NEGATIVE  NITRITE NEGATIVE  LEUKOCYTESUR NEGATIVE      Imaging:  No results found.   Assessment & Plan: Pt is a 65 y.o. 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    1. Status post renal transplantation in 2001. The patient has done quite well from the perspective of renal transplantation. Patient received his kidney from his wife. His baseline creatinine is 2.2 from December 2016. Creatinine here is 2.1.  Therefore we will continue current doses of Prograf, CellCept, and prednisone. No additional workup necessary at the moment.  2. Anemia of chronic kidney disease/GI bleed. Hemoglobin was as low as 6.2. Hemoglobin currently up to 7.2. We recommend limiting blood transfusion given his history of renal transplantation. Patient may need to consider therapy with Epogen as an outpatient. Additional workup per gastroenterology.  3. Hypertension.  Continue amlodipine as well as losartan for blood pressure control for now.  4. Thank you for consultation.

## 2015-02-25 LAB — BASIC METABOLIC PANEL
ANION GAP: 6 (ref 5–15)
BUN: 35 mg/dL — ABNORMAL HIGH (ref 6–20)
CALCIUM: 8.7 mg/dL — AB (ref 8.9–10.3)
CO2: 25 mmol/L (ref 22–32)
Chloride: 109 mmol/L (ref 101–111)
Creatinine, Ser: 2.07 mg/dL — ABNORMAL HIGH (ref 0.61–1.24)
GFR calc Af Amer: 37 mL/min — ABNORMAL LOW (ref >60)
GFR, EST NON AFRICAN AMERICAN: 32 mL/min — AB (ref >60)
Glucose, Bld: 90 mg/dL (ref 65–99)
POTASSIUM: 3.4 mmol/L — AB (ref 3.5–5.1)
SODIUM: 140 mmol/L (ref 135–145)

## 2015-02-25 LAB — TYPE AND SCREEN
ABO/RH(D): A NEG
Antibody Screen: NEGATIVE
UNIT DIVISION: 0
Unit division: 0
WEAK D: POSITIVE

## 2015-02-25 LAB — CBC
HEMATOCRIT: 21.3 % — AB (ref 40.0–52.0)
HEMOGLOBIN: 7 g/dL — AB (ref 13.0–18.0)
MCH: 29.3 pg (ref 26.0–34.0)
MCHC: 33 g/dL (ref 32.0–36.0)
MCV: 88.9 fL (ref 80.0–100.0)
Platelets: 117 10*3/uL — ABNORMAL LOW (ref 150–440)
RBC: 2.4 MIL/uL — AB (ref 4.40–5.90)
RDW: 13.8 % (ref 11.5–14.5)
WBC: 6.3 10*3/uL (ref 3.8–10.6)

## 2015-02-25 LAB — PROTIME-INR
INR: 2.41
PROTHROMBIN TIME: 26 s — AB (ref 11.4–15.0)

## 2015-02-25 NOTE — Care Management Important Message (Signed)
Important Message  Patient Details  Name: Eric Gill MRN: [id number] Date of Birth: Dec 13, 1950   Medicare Important Message Given:  Yes    [name] 02/25/2015, 11:17 AM

## 2015-02-25 NOTE — Progress Notes (Signed)
[facility]  ROUNDING NOTE   Subjective:  No further hematemesis reported. Creatinine stable at 2.07. Remains adherent with immunosuppression.   Objective:  Vital signs in last 24 hours:  Temp:  [98 F (36.7 C)-98.5 F (36.9 C)] 98.5 F (36.9 C) (01/18 0450) Pulse Rate:  [66-87] 66 (01/18 0451) Resp:  [18-20] 18 (01/18 0450) BP: (129-164)/(66-98) 146/66 mmHg (01/18 0451) SpO2:  [94 %-98 %] 94 % (01/18 0450)  Weight change:  Filed Weights   02/23/15 1558 02/23/15 1953  Weight: 65.772 kg (145 lb) 64.774 kg (142 lb 12.8 oz)    Intake/Output: I/O last 3 completed shifts: In: [id number] [P.O.:360; Blood:272] Out: -    Intake/Output this shift:  Total I/O In: 760 [P.O.:760] Out: -   Physical Exam: General: NAD, resting in bed  Head: Normocephalic, atraumatic. Moist oral mucosal membranes  Eyes: Anicteric  Neck: Supple, trachea midline  Lungs:  Clear to auscultation  Heart: Regular rate and rhythm  Abdomen:  Soft, nontender,   Extremities: no peripheral edema.  Neurologic: Nonfocal, moving all four extremities  Skin: No lesions       Basic Metabolic Panel:  Recent Labs Lab 02/23/15 1612 02/24/15 0854 02/25/15 0856  NA 143 141 140  K 4.4 3.7 3.4*  CL 110 111 109  CO2 25 24 25   GLUCOSE 104* 88 90  BUN 65* 51* 35*  CREATININE 2.19* 2.16* 2.07*  CALCIUM 9.5 8.5* 8.7*    Liver Function Tests:  Recent Labs Lab 02/23/15 1612  AST 19  ALT 13*  ALKPHOS 64  BILITOT 0.6  PROT 5.4*  ALBUMIN 3.1*    Recent Labs Lab 02/23/15 1612  LIPASE 34   No results for input(s): AMMONIA in the last 168 hours.  CBC:  Recent Labs Lab 02/23/15 1612 02/23/15 1958 02/24/15 0213 02/24/15 0854 02/24/15 1638 02/25/15 0526  WBC 8.4  --   --  5.9  --  6.3  HGB 7.3* 7.0* 6.2* 7.2* 7.4* 7.0*  HCT 23.3*  --   --  22.2*  --  21.3*  MCV 87.9  --   --  87.7  --  88.9  PLT 130*  --   --  120*  --  117*    Cardiac Enzymes: No results for input(s): CKTOTAL,  CKMB, CKMBINDEX, TROPONINI in the last 168 hours.  BNP: Invalid input(s): POCBNP  CBG: No results for input(s): GLUCAP in the last 168 hours.  Microbiology: Results for orders placed or performed during the hospital encounter of 10/29/14  Urine culture     Status: None   Collection Time: 10/29/14  1:01 PM  Result Value Ref Range Status   Specimen Description URINE, RANDOM  Final   Special Requests NONE  Final   Culture 7,000 COLONIES/mL INSIGNIFICANT GROWTH  Final   Report Status 10/31/2014 FINAL  Final    Coagulation Studies:  Recent Labs  02/23/15 1612 02/24/15 0854 02/24/15 1638 02/25/15 0856  LABPROT 30.0* 27.9* 26.5* 26.0*  INR 2.92 2.65 2.48 2.41    Urinalysis:  Recent Labs  02/23/15 1722  COLORURINE YELLOW*  LABSPEC 1.015  PHURINE 5.0  GLUCOSEU NEGATIVE  HGBUR 1+*  BILIRUBINUR NEGATIVE  KETONESUR NEGATIVE  PROTEINUR NEGATIVE  NITRITE NEGATIVE  LEUKOCYTESUR NEGATIVE      Imaging: No results found.   Medications:     . amLODipine  5 mg Oral Q1200  . calcitRIOL  0.25 mcg Oral QHS  . citalopram  10 mg Oral QHS  . donepezil  10  mg Oral QHS  . losartan  50 mg Oral QHS  . montelukast  10 mg Oral QHS  . mycophenolate  500 mg Oral BID  . pantoprazole (PROTONIX) IV  40 mg Intravenous Q12H  . predniSONE  5 mg Oral Daily  . tacrolimus  0.5 mg Oral QHS   ondansetron  Assessment/ Plan:  65 y.o. 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   1. Status post renal transplantation in 2001. The patient has done quite well from the perspective of renal transplantation. Patient received his kidney from his wife.  His baseline creatinine is 2.2 from December 2016.  -  Renal function remained stable at this point in time.  Creatinine currently 2.07which is similar to his most recent outpatientcreatinine.  Therefore we will maintain current doses of prednisone, tacrolimus,and CellCept.  Continue to monitor renal function while here.  BUN elevation likely explained by GI bleed.  2. Anemia of chronic kidney disease/GI bleed. Hemoglobin currently 7.0.  Avoid blood transfusion as possible as this can potentially increase his risk for rejection.  Agree with stopping IV fluids which can lead to a dilution of hemoglobin  3. Hypertension.  Blood pressure 146/66 at present.  Continue losartan at its current dosage.  4.  Secondary hyperparathyroidism.  Continue calcitriol 0.25 g by mouth daily.   LOS: 2 Ford Peddie 1/18/201712:18 PM

## 2015-02-25 NOTE — Plan of Care (Signed)
Problem: Bowel/Gastric: Goal: Will show no signs and symptoms of gastrointestinal bleeding Outcome: Progressing Pt received one unit of prbcs yesterday. Last hgb 7.3. Next lab draw this am. No active signs of bleeding. Tolerated clear liquids prior to midnight. NPO for possible EGD depending on PT/INR.

## 2015-02-25 NOTE — Progress Notes (Signed)
GI Inpatient Follow-up Note  Patient Identification: Eric Gill is a 65 y.o. male with likely UGI bleed and severe anemia.   Subjective:  NO more melena or coffee ground emesis. Hgb down some.  Denies n/v, c/p, SOB.   Scheduled Inpatient Medications:  . amLODipine  5 mg Oral Q1200  . calcitRIOL  0.25 mcg Oral QHS  . citalopram  10 mg Oral QHS  . donepezil  10 mg Oral QHS  . losartan  50 mg Oral QHS  . montelukast  10 mg Oral QHS  . mycophenolate  500 mg Oral BID  . pantoprazole (PROTONIX) IV  40 mg Intravenous Q12H  . predniSONE  5 mg Oral Daily  . tacrolimus  0.5 mg Oral QHS    Continuous Inpatient Infusions:     PRN Inpatient Medications:  ondansetron  Review of Systems: Constitutional: Weight is stable.  Eyes: No changes in vision. ENT: No oral lesions, sore throat.  GI: see HPI.  Heme/Lymph: No easy bruising.  CV: No chest pain.  GU: No hematuria.  Integumentary: No rashes.  Neuro: No headaches.  Psych: No depression/anxiety.  Endocrine: No heat/cold intolerance.  Allergic/Immunologic: No urticaria.  Resp: No cough, SOB.  Musculoskeletal: No joint swelling.    Physical Examination: BP 160/64 mmHg  Pulse 75  Temp(Src) 98.4 F (36.9 C) (Oral)  Resp 18  Ht 5\' 7"  (1.702 m)  Wt 64.774 kg (142 lb 12.8 oz)  BMI 22.36 kg/m2  SpO2 98% Gen: NAD, alert and oriented x 4 HEENT: PEERLA, EOMI, Neck: supple, no JVD or thyromegaly Chest: CTA bilaterally, no wheezes, crackles, or other adventitious sounds CV: RRR, no m/g/c/r Abd: soft, NT, ND, +BS in all four quadrants; no HSM, guarding, ridigity, or rebound tenderness Ext: no edema, well perfused with 2+ pulses, Skin: no rash or lesions noted Lymph: no LAD  Data: Lab Results  Component Value Date   WBC 6.3 02/25/2015   HGB 7.0* 02/25/2015   HCT 21.3* 02/25/2015   MCV 88.9 02/25/2015   PLT 117* 02/25/2015    Recent Labs Lab 02/24/15 0854 02/24/15 1638 02/25/15 0526  HGB 7.2* 7.4* 7.0*   Lab  Results  Component Value Date   NA 140 02/25/2015   K 3.4* 02/25/2015   CL 109 02/25/2015   CO2 25 02/25/2015   BUN 35* 02/25/2015   CREATININE 2.07* 02/25/2015   Lab Results  Component Value Date   ALT 13* 02/23/2015   AST 19 02/23/2015   ALKPHOS 64 02/23/2015   BILITOT 0.6 02/23/2015    Recent Labs Lab 02/25/15 0856  INR 2.41   Assessment/Plan: [name] is a 65 y.o. male with UGIB.  Hgb now stable and no more melena or coffee grounds.    Recommendations: - EGD once INR < 2, which will not reach that tomorrow.  - will likely need heparin drip once INR < 2 - cont q12 protonix - follow Hgb and hemodynamics.   Please call with questions or concerns.  Larya Charpentier, Grace Blight, MD

## 2015-02-25 NOTE — Progress Notes (Signed)
[place] at [place] NAME: Eric Gill    MR#:  [id number]  DATE OF BIRTH:  06/19/1950  SUBJECTIVE:   No further hemetemesis, pt inr >2 unable to have EGD today REVIEW OF SYSTEMS:   Review of Systems  Constitutional: Negative for fever, chills and weight loss.  HENT: Negative for ear discharge, ear pain and nosebleeds.   Eyes: Negative for blurred vision, pain and discharge.  Respiratory: Negative for sputum production, shortness of breath, wheezing and stridor.   Cardiovascular: Negative for chest pain, palpitations, orthopnea and PND.  Gastrointestinal: Positive for nausea and blood in stool. Negative for vomiting, abdominal pain and diarrhea.  Genitourinary: Negative for urgency and frequency.  Musculoskeletal: Negative for back pain and joint pain.  Neurological: Negative for sensory change, speech change, focal weakness and weakness.  Psychiatric/Behavioral: Negative for depression and hallucinations. The patient is not nervous/anxious.   All other systems reviewed and are negative.  Tolerating Diet:npo Tolerating PT: not needed  DRUG ALLERGIES:   Allergies  Allergen Reactions  . Statins Nausea And Vomiting and Other (See Comments)    Reaction:  Muscle pain     VITALS:  Blood pressure 146/66, pulse 66, temperature 98.5 F (36.9 C), temperature source Oral, resp. rate 18, height 5\' 7"  (1.702 m), weight 64.774 kg (142 lb 12.8 oz), SpO2 94 %.  PHYSICAL EXAMINATION:   Physical Exam  GENERAL:  65 y.o.-year-old patient lying in the bed with no acute distress. Pallor+ EYES: Pupils equal, round, reactive to light and accommodation. No scleral icterus. Extraocular muscles intact.  HEENT: Head atraumatic, normocephalic. Oropharynx and nasopharynx clear.  NECK:  Supple, no jugular venous distention. No thyroid enlargement, no tenderness.  LUNGS: Normal breath sounds bilaterally, no wheezing, rales, rhonchi. No use of  accessory muscles of respiration.  CARDIOVASCULAR: S1, S2 normal. No murmurs, rubs, or gallops.  ABDOMEN: Soft, nontender, nondistended. Bowel sounds present. No organomegaly or mass.  EXTREMITIES: No cyanosis, clubbing or edema b/l.    NEUROLOGIC: Cranial nerves II through XII are intact. No focal Motor or sensory deficits b/l.   PSYCHIATRIC: The patient is alert and oriented x 3.  SKIN: No obvious rash, lesion, or ulcer.   LABORATORY PANEL:  CBC  Recent Labs Lab 02/25/15 0526  WBC 6.3  HGB 7.0*  HCT 21.3*  PLT 117*    Chemistries   Recent Labs Lab 02/23/15 1612  02/25/15 0856  NA 143  < > 140  K 4.4  < > 3.4*  CL 110  < > 109  CO2 25  < > 25  GLUCOSE 104*  < > 90  BUN 65*  < > 35*  CREATININE 2.19*  < > 2.07*  CALCIUM 9.5  < > 8.7*  AST 19  --   --   ALT 13*  --   --   ALKPHOS 64  --   --   BILITOT 0.6  --   --   < > = values in this interval not displayed. ASSESSMENT AND PLAN:  Eric Gill is a 65 y.o. male with a known history of status post kidney transplant, hyperparathyroidism, DVT, TIA, antiphospholipid syndrome, chronic Coumadin use, vascular dementia, hyperlipidemia came in with coffee ground vomitus  * Acute blood loss anemia secondary to most likely upper GI bleed Hemoglobin is 7.3--67.0--6.2--1 unit BT--7.0  -Patient had some complaint of burning in the stomach, and reflux- but wife and daughter denies any use of NSAIDs or aspirin as  he had renal transplant. -cont IV fluid, give PPI twice a day. -Hold his Coumadin and any other anti-coagulation at this time.  * Status post kidney transplant His renal function is stable and he is on multiple transplant medications. -appreciate nephrology input  * Hypertension Continue amlodipine for now and follow his blood pressure.   * Antiphospholipid syndrome -He also had history of blood clots in the past and TIAs and he was advised to take Coumadin. -INR is therapeutic currently, but due to GI bleed   would hold Coumadin for now - allow coumadin to drift down  * TIA and vascular dementia Continue home medications.  Management plans discussed with the patient, family and they are in agreement.  Case discussed with Care Management/Social Worker. Management plans discussed with the patient, family and they are in agreement.  CODE STATUS:full  DVT Prophylaxis: SCD  TOTAL TIME TAKING CARE OF THIS PATIENT: 30 minutes.      Note: This dictation was prepared with Dragon dictation along with smaller phrase technology. Any transcriptional errors that result from this process are unintentional.  Dustin Flock M.D on 02/25/2015 at 1:17 PM  Between 7am to 6pm - Pager - [phone]  After 6pm go to [email or web address] - password [email or web address] [facility]  Office  [phone]  CC: Primary care physician; Idelle Crouch, MD

## 2015-02-26 LAB — CBC
HEMATOCRIT: 22.2 % — AB (ref 40.0–52.0)
HEMOGLOBIN: 7.3 g/dL — AB (ref 13.0–18.0)
MCH: 29.5 pg (ref 26.0–34.0)
MCHC: 32.9 g/dL (ref 32.0–36.0)
MCV: 89.6 fL (ref 80.0–100.0)
Platelets: 119 10*3/uL — ABNORMAL LOW (ref 150–440)
RBC: 2.48 MIL/uL — AB (ref 4.40–5.90)
RDW: 14.1 % (ref 11.5–14.5)
WBC: 5.9 10*3/uL (ref 3.8–10.6)

## 2015-02-26 LAB — BASIC METABOLIC PANEL
Anion gap: 3 — ABNORMAL LOW (ref 5–15)
BUN: 24 mg/dL — AB (ref 6–20)
CHLORIDE: 110 mmol/L (ref 101–111)
CO2: 26 mmol/L (ref 22–32)
CREATININE: 1.92 mg/dL — AB (ref 0.61–1.24)
Calcium: 8.6 mg/dL — ABNORMAL LOW (ref 8.9–10.3)
GFR calc Af Amer: 40 mL/min — ABNORMAL LOW (ref >60)
GFR calc non Af Amer: 35 mL/min — ABNORMAL LOW (ref >60)
Glucose, Bld: 90 mg/dL (ref 65–99)
Potassium: 3.2 mmol/L — ABNORMAL LOW (ref 3.5–5.1)
SODIUM: 139 mmol/L (ref 135–145)

## 2015-02-26 LAB — PROTIME-INR
INR: 2.34
Prothrombin Time: 25.4 seconds — ABNORMAL HIGH (ref 11.4–15.0)

## 2015-02-26 LAB — H PYLORI, IGM, IGG, IGA AB
H. Pylogi, Iga Abs: <9 units (ref 0.0–8.9)
H. Pylogi, Igm Abs: <9 units (ref 0.0–8.9)

## 2015-02-26 MED ORDER — POTASSIUM CHLORIDE CRYS ER 20 MEQ PO TBCR
40.0000 meq | EXTENDED_RELEASE_TABLET | Freq: Once | ORAL | Status: AC
  Administered 2015-02-26: 13:00:00 40 meq via ORAL
  Filled 2015-02-26: qty 2

## 2015-02-26 NOTE — Plan of Care (Signed)
Problem: Activity: Goal: Risk for activity intolerance will decrease Outcome: Progressing Ambulated in hall with wife and staff today around nurses station x 1 tolerated well   Problem: Fluid Volume: Goal: Ability to maintain a balanced intake and output will improve Outcome: Progressing Tolerating reg diet well at this time.   Problem: Bowel/Gastric: Goal: Will show no signs and symptoms of gastrointestinal bleeding Outcome: Progressing No s/s of bleed

## 2015-02-26 NOTE — Plan of Care (Signed)
Problem: Safety: Goal: Ability to remain free from injury will improve Outcome: Progressing Moderate fall risk.  Because of pts dementia, bed alarm used at night.  Pt free from injury this shift.  Problem: Skin Integrity: Goal: Risk for impaired skin integrity will decrease Outcome: Progressing No skin breakdown.  Pt ambulatory.  Problem: Activity: Goal: Risk for activity intolerance will decrease Outcome: Progressing Pt able to get up from bed with stand by assist only.  Problem: Fluid Volume: Goal: Ability to maintain a balanced intake and output will improve Outcome: Progressing Pt tolerating a clear liquid diet.  Problem: Bowel/Gastric: Goal: Will show no signs and symptoms of gastrointestinal bleeding Outcome: Progressing BM x 1 this shift that was green in color.

## 2015-02-26 NOTE — Progress Notes (Signed)
[facility]  ROUNDING NOTE   Subjective:  Cr down to 1.92. Hemoglobin up to 7.3.  no further hematemesis.   Objective:  Vital signs in last 24 hours:  Temp:  [98.4 F (36.9 C)-98.9 F (37.2 C)] 98.4 F (36.9 C) (01/19 0533) Pulse Rate:  [64-69] 64 (01/19 0533) Resp:  [18] 18 (01/19 0533) BP: (166-168)/(63-74) 166/63 mmHg (01/19 0533) SpO2:  [97 %] 97 % (01/19 0533)  Weight change:  Filed Weights   02/23/15 1558 02/23/15 1953  Weight: 65.772 kg (145 lb) 64.774 kg (142 lb 12.8 oz)    Intake/Output: I/O last 3 completed shifts: In: 2280 [P.O.:2280] Out: -    Intake/Output this shift:     Physical Exam: General: NAD, resting in bed  Head: Normocephalic, atraumatic. Moist oral mucosal membranes  Eyes: Anicteric  Neck: Supple, trachea midline  Lungs:  Clear to auscultation  Heart: Regular rate and rhythm  Abdomen:  Soft, nontender,   Extremities: no peripheral edema.  Neurologic: Nonfocal, moving all four extremities  Skin: No lesions       Basic Metabolic Panel:  Recent Labs Lab 02/23/15 1612 02/24/15 0854 02/25/15 0856 02/26/15 0504  NA 143 141 140 139  K 4.4 3.7 3.4* 3.2*  CL 110 111 109 110  CO2 25 24 25 26   GLUCOSE 104* 88 90 90  BUN 65* 51* 35* 24*  CREATININE 2.19* 2.16* 2.07* 1.92*  CALCIUM 9.5 8.5* 8.7* 8.6*    Liver Function Tests:  Recent Labs Lab 02/23/15 1612  AST 19  ALT 13*  ALKPHOS 64  BILITOT 0.6  PROT 5.4*  ALBUMIN 3.1*    Recent Labs Lab 02/23/15 1612  LIPASE 34   No results for input(s): AMMONIA in the last 168 hours.  CBC:  Recent Labs Lab 02/23/15 1612  02/24/15 0213 02/24/15 0854 02/24/15 1638 02/25/15 0526 02/26/15 0504  WBC 8.4  --   --  5.9  --  6.3 5.9  HGB 7.3*  < > 6.2* 7.2* 7.4* 7.0* 7.3*  HCT 23.3*  --   --  22.2*  --  21.3* 22.2*  MCV 87.9  --   --  87.7  --  88.9 89.6  PLT 130*  --   --  120*  --  117* 119*  < > = values in this interval not displayed.  Cardiac Enzymes: No  results for input(s): CKTOTAL, CKMB, CKMBINDEX, TROPONINI in the last 168 hours.  BNP: Invalid input(s): POCBNP  CBG: No results for input(s): GLUCAP in the last 168 hours.  Microbiology: Results for orders placed or performed during the hospital encounter of 10/29/14  Urine culture     Status: None   Collection Time: 10/29/14  1:01 PM  Result Value Ref Range Status   Specimen Description URINE, RANDOM  Final   Special Requests NONE  Final   Culture 7,000 COLONIES/mL INSIGNIFICANT GROWTH  Final   Report Status 10/31/2014 FINAL  Final    Coagulation Studies:  Recent Labs  02/23/15 1612 02/24/15 0854 02/24/15 1638 02/25/15 0856 02/26/15 0504  LABPROT 30.0* 27.9* 26.5* 26.0* 25.4*  INR 2.92 2.65 2.48 2.41 2.34    Urinalysis:  Recent Labs  02/23/15 1722  COLORURINE YELLOW*  LABSPEC 1.015  PHURINE 5.0  GLUCOSEU NEGATIVE  HGBUR 1+*  BILIRUBINUR NEGATIVE  KETONESUR NEGATIVE  PROTEINUR NEGATIVE  NITRITE NEGATIVE  LEUKOCYTESUR NEGATIVE      Imaging: No results found.   Medications:     . amLODipine  5 mg Oral  Q1200  . calcitRIOL  0.25 mcg Oral QHS  . citalopram  10 mg Oral QHS  . donepezil  10 mg Oral QHS  . losartan  50 mg Oral QHS  . montelukast  10 mg Oral QHS  . mycophenolate  500 mg Oral BID  . pantoprazole (PROTONIX) IV  40 mg Intravenous Q12H  . predniSONE  5 mg Oral Daily  . tacrolimus  0.5 mg Oral QHS   ondansetron  Assessment/ Plan:  65 y.o. 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   1. Status post renal transplantation in 2001. The patient has done quite well from the perspective of renal  transplantation. Patient received his kidney from his wife. His baseline creatinine is 2.2 from December 2016.  -  Renal function has actually improved over the course of the hospitalization. Creatinine down to 1.9. Continue current doses of tacrolimus, prednisone, and CellCept.  2. Anemia of chronic kidney disease/GI bleed. Hemoglobin improved to 7.3. No indication for transfusion at present. Continue to monitor CBC periodically.  3. Hypertension.  Blood pressure higher today at 160/64.  For now continue amlodipine and losartan.  4.  Secondary hyperparathyroidism.  Continue calcitriol 0.25 g by mouth daily.   LOS: 3 Laci Frenkel 1/19/20171:25 PM

## 2015-02-26 NOTE — Progress Notes (Signed)
[place] at [place] NAME: Eric Gill    MR#:  [id number]  DATE OF BIRTH:  1950/09/28   Subjective: INR continues to be elevated, patient without any further evidence of hematemesis    REVIEW OF SYSTEMS:   Review of Systems  Constitutional: Negative for fever, chills and weight loss.  HENT: Negative for ear discharge, ear pain and nosebleeds.   Eyes: Negative for blurred vision, pain and discharge.  Respiratory: Negative for sputum production, shortness of breath, wheezing and stridor.   Cardiovascular: Negative for chest pain, palpitations, orthopnea and PND.  Gastrointestinal: Positive for nausea and blood in stool. Negative for vomiting, abdominal pain and diarrhea.  Genitourinary: Negative for urgency and frequency.  Musculoskeletal: Negative for back pain and joint pain.  Neurological: Negative for sensory change, speech change, focal weakness and weakness.  Psychiatric/Behavioral: Negative for depression and hallucinations. The patient is not nervous/anxious.   All other systems reviewed and are negative.  Tolerating Diet:npo Tolerating PT: not needed  DRUG ALLERGIES:   Allergies  Allergen Reactions  . Statins Nausea And Vomiting and Other (See Comments)    Reaction:  Muscle pain     VITALS:  Blood pressure 166/63, pulse 64, temperature 98.4 F (36.9 C), temperature source Oral, resp. rate 18, height 5\' 7"  (1.702 m), weight 64.774 kg (142 lb 12.8 oz), SpO2 97 %.  PHYSICAL EXAMINATION:   Physical Exam  GENERAL:  65 y.o.-year-old patient lying in the bed with no acute distress. Pallor+ EYES: Pupils equal, round, reactive to light and accommodation. No scleral icterus. Extraocular muscles intact.  HEENT: Head atraumatic, normocephalic. Oropharynx and nasopharynx clear.  NECK:  Supple, no jugular venous distention. No thyroid enlargement, no tenderness.  LUNGS: Normal breath sounds bilaterally, no wheezing, rales,  rhonchi. No use of accessory muscles of respiration.  CARDIOVASCULAR: S1, S2 normal. No murmurs, rubs, or gallops.  ABDOMEN: Soft, nontender, nondistended. Bowel sounds present. No organomegaly or mass.  EXTREMITIES: No cyanosis, clubbing or edema b/l.    NEUROLOGIC: Cranial nerves II through XII are intact. No focal Motor or sensory deficits b/l.   PSYCHIATRIC: The patient is alert and oriented x 3.  SKIN: No obvious rash, lesion, or ulcer.   LABORATORY PANEL:  CBC  Recent Labs Lab 02/26/15 0504  WBC 5.9  HGB 7.3*  HCT 22.2*  PLT 119*    Chemistries   Recent Labs Lab 02/23/15 1612  02/26/15 0504  NA 143  < > 139  K 4.4  < > 3.2*  CL 110  < > 110  CO2 25  < > 26  GLUCOSE 104*  < > 90  BUN 65*  < > 24*  CREATININE 2.19*  < > 1.92*  CALCIUM 9.5  < > 8.6*  AST 19  --   --   ALT 13*  --   --   ALKPHOS 64  --   --   BILITOT 0.6  --   --   < > = values in this interval not displayed. ASSESSMENT AND PLAN:  Eric Gill is a 65 y.o. male with a known history of status post kidney transplant, hyperparathyroidism, DVT, TIA, antiphospholipid syndrome, chronic Coumadin use, vascular dementia, hyperlipidemia came in with coffee ground vomitus  * Acute blood loss anemia secondary to most likely upper GI bleed Hemoglobin is 7.3--67.0--6.2--1 unit BT--7.0-7,3 -cont IV fluid, give PPI twice a day. -Hold his Coumadin at this time.  * Status post kidney transplant His  renal function is stable and he is on multiple transplant medications. -appreciate nephrology input  * Hypertension Continue amlodipine for now and follow his blood pressure.   * Antiphospholipid syndrome -He also had history of blood clots in the past and TIAs and he was advised to take Coumadin. -INR is therapeutic currently, but due to GI bleed  would hold Coumadin for now - allow coumadin to drift down  * TIA and vascular dementia Continue home medications.  Management plans discussed with the  patient, family and they are in agreement.  Case discussed with Care Management/Social Worker. Management plans discussed with the patient, family and they are in agreement.  CODE STATUS:full  DVT Prophylaxis: SCD  TOTAL TIME TAKING CARE OF THIS PATIENT: 30 minutes.      Note: This dictation was prepared with Dragon dictation along with smaller phrase technology. Any transcriptional errors that result from this process are unintentional.  Dustin Flock M.D on 02/26/2015 at 12:15 PM  Between 7am to 6pm - Pager - [phone]  After 6pm go to [email or web address] - password [email or web address] [facility]  Office  [phone]  CC: Primary care physician; Idelle Crouch, MD

## 2015-02-27 LAB — RENAL FUNCTION PANEL
ALBUMIN: 3.1 g/dL — AB (ref 3.5–5.0)
ANION GAP: 6 (ref 5–15)
BUN: 20 mg/dL (ref 6–20)
CALCIUM: 8.5 mg/dL — AB (ref 8.9–10.3)
CO2: 25 mmol/L (ref 22–32)
CREATININE: 1.94 mg/dL — AB (ref 0.61–1.24)
Chloride: 105 mmol/L (ref 101–111)
GFR, EST AFRICAN AMERICAN: 40 mL/min — AB (ref >60)
GFR, EST NON AFRICAN AMERICAN: 34 mL/min — AB (ref >60)
Glucose, Bld: 92 mg/dL (ref 65–99)
PHOSPHORUS: 2.2 mg/dL — AB (ref 2.5–4.6)
Potassium: 3.2 mmol/L — ABNORMAL LOW (ref 3.5–5.1)
SODIUM: 136 mmol/L (ref 135–145)

## 2015-02-27 LAB — HEPARIN LEVEL (UNFRACTIONATED)
HEPARIN UNFRACTIONATED: 0.33 [IU]/mL (ref 0.30–0.70)
HEPARIN UNFRACTIONATED: 0.51 [IU]/mL (ref 0.30–0.70)

## 2015-02-27 LAB — CBC
HCT: 22.7 % — ABNORMAL LOW (ref 40.0–52.0)
HEMOGLOBIN: 7.3 g/dL — AB (ref 13.0–18.0)
MCH: 28.1 pg (ref 26.0–34.0)
MCHC: 32.2 g/dL (ref 32.0–36.0)
MCV: 87.3 fL (ref 80.0–100.0)
PLATELETS: 138 10*3/uL — AB (ref 150–440)
RBC: 2.6 MIL/uL — AB (ref 4.40–5.90)
RDW: 13.9 % (ref 11.5–14.5)
WBC: 6.3 10*3/uL (ref 3.8–10.6)

## 2015-02-27 LAB — PROTIME-INR
INR: 1.79
PROTHROMBIN TIME: 20.8 s — AB (ref 11.4–15.0)

## 2015-02-27 LAB — MAGNESIUM: MAGNESIUM: 1.8 mg/dL (ref 1.7–2.4)

## 2015-02-27 MED ORDER — HEPARIN (PORCINE) IN NACL 100-0.45 UNIT/ML-% IJ SOLN
1050.0000 [IU]/h | INTRAMUSCULAR | Status: DC
  Administered 2015-02-27 – 2015-02-28 (×2): 1050 [IU]/h via INTRAVENOUS
  Filled 2015-02-27 (×3): qty 250

## 2015-02-27 MED ORDER — POTASSIUM CHLORIDE CRYS ER 20 MEQ PO TBCR
40.0000 meq | EXTENDED_RELEASE_TABLET | ORAL | Status: AC
  Administered 2015-02-27 (×2): 40 meq via ORAL
  Filled 2015-02-27 (×2): qty 2

## 2015-02-27 MED ORDER — POTASSIUM CHLORIDE CRYS ER 20 MEQ PO TBCR: 40.0000 meq | EXTENDED_RELEASE_TABLET | Freq: Three times a day (TID) | ORAL | Status: DC

## 2015-02-27 MED ORDER — AMLODIPINE BESYLATE 10 MG PO TABS
10.0000 mg | ORAL_TABLET | Freq: Every day | ORAL | Status: DC
  Administered 2015-02-28 – 2015-03-08 (×9): 10 mg via ORAL
  Filled 2015-02-27 (×10): qty 1

## 2015-02-27 NOTE — Progress Notes (Addendum)
ANTICOAGULATION CONSULT NOTE - Initial Consult  Pharmacy Consult for Heparin  Indication: antiphospholipid syndrome, treat as VTE (bridging)    Allergies  Allergen Reactions  . Statins Nausea And Vomiting and Other (See Comments)    Reaction:  Muscle pain     Patient Measurements: Height: 5\' 7"  (170.2 cm) Weight: 142 lb 12.8 oz (64.774 kg) IBW/kg (Calculated) : 66.1 Heparin Dosing Weight:   Vital Signs: Temp: 97.9 F (36.6 C) (01/20 0503) Temp Source: Oral (01/20 0503) BP: 179/77 mmHg (01/20 0504) Pulse Rate: 87 (01/20 0504)  Labs:  Recent Labs  02/25/15 0526 02/25/15 0856 02/26/15 0504 02/27/15 0651  HGB 7.0*  --  7.3* 7.3*  HCT 21.3*  --  22.2* 22.7*  PLT 117*  --  119* 138*  LABPROT  --  26.0* 25.4* 20.8*  INR  --  2.41 2.34 1.79  CREATININE  --  2.07* 1.92* 1.94*    Estimated Creatinine Clearance: 34.3 mL/min (by C-G formula based on Cr of 1.94).   Medical History: Past Medical History  Diagnosis Date  . Status post kidney transplant 2001  . Hyperparathyroidism ([place])   . DVT (deep venous thrombosis) ([place])   . TIA (transient ischemic attack)   . Essential hypertension   . History of CMV   . Pedal edema   . Rosacea   . Antiphospholipid antibody syndrome ([place])   . Hyperlipidemia   . Vascular dementia without behavioral disturbance     Medications:  Prescriptions prior to admission  Medication Sig Dispense Refill Last Dose  . amLODipine (NORVASC) 5 MG tablet Take 5 mg by mouth daily at 12 noon.   02/23/2015 at Unknown time  . calcitRIOL (ROCALTROL) 0.25 MCG capsule Take 0.25 mcg by mouth at bedtime.   02/22/2015 at Unknown time  . citalopram (CELEXA) 10 MG tablet Take 10 mg by mouth at bedtime.   02/22/2015 at Unknown time  . COUMADIN 1 MG tablet Take 2-3 mg by mouth at bedtime. Pt takes 2mg  on Monday and Friday.   Pt takes 3mg  all other days.   Pt is only able to use brand name.   02/22/2015 at 2300  . donepezil (ARICEPT) 10 MG tablet Take 10 mg by  mouth at bedtime.   02/22/2015 at Unknown time  . furosemide (LASIX) 20 MG tablet Take 20 mg by mouth daily as needed for edema.    PRN at PRN  . labetalol (NORMODYNE) 200 MG tablet Take 200 mg by mouth 2 (two) times daily.    02/23/2015 at 1030  . losartan (COZAAR) 50 MG tablet Take 50 mg by mouth at bedtime.    02/22/2015 at Unknown time  . montelukast (SINGULAIR) 10 MG tablet Take 10 mg by mouth at bedtime.   02/22/2015 at Unknown time  . mycophenolate (CELLCEPT) 500 MG tablet Take 500 mg by mouth 2 (two) times daily.   02/23/2015 at Unknown time  . omeprazole (PRILOSEC) 20 MG capsule Take 40 mg by mouth at bedtime.    02/22/2015 at Unknown time  . predniSONE (DELTASONE) 5 MG tablet Take 5 mg by mouth daily.   02/23/2015 at Unknown time  . tacrolimus (PROGRAF) 0.5 MG capsule Take 0.5 mg by mouth at bedtime.    02/22/2015 at Unknown time   Scheduled:  . amLODipine  5 mg Oral Q1200  . calcitRIOL  0.25 mcg Oral QHS  . citalopram  10 mg Oral QHS  . donepezil  10 mg Oral QHS  . losartan  50 mg  Oral QHS  . montelukast  10 mg Oral QHS  . mycophenolate  500 mg Oral BID  . pantoprazole (PROTONIX) IV  40 mg Intravenous Q12H  . predniSONE  5 mg Oral Daily  . tacrolimus  0.5 mg Oral QHS    Assessment: Pharmacy consulted to dose and monitor Heparin therapy in this 65 year old male being used to bridge for antiphospholipid syndrome. [name] on warfarin therapy at home   MD does not desire a bolus.   Goal of Therapy:  Heparin level 0.3-0.7 units/ml Monitor platelets by anticoagulation protocol: Yes   Plan:  Start heparin infusion at 1050 units/hr Check anti-Xa level in 6 hours and daily while on heparin Continue to monitor H&H and platelets  Heparin level ordered to be drawn @ 17:00.    Grenda Lora D 02/27/2015,10:38 AM

## 2015-02-27 NOTE — Progress Notes (Signed)
ANTICOAGULATION CONSULT NOTE - Initial Consult  Pharmacy Consult for Heparin  Indication: antiphospholipid syndrome, treat as VTE (bridging)    Allergies  Allergen Reactions  . Statins Nausea And Vomiting and Other (See Comments)    Reaction:  Muscle pain     Patient Measurements: Height: 5\' 7"  (170.2 cm) Weight: 142 lb 12.8 oz (64.774 kg) IBW/kg (Calculated) : 66.1 Heparin Dosing Weight: 64.8 kg  Vital Signs: Temp: 99.1 F (37.3 C) (01/20 2123) Temp Source: Oral (01/20 2123) BP: 167/67 mmHg (01/20 2123) Pulse Rate: 68 (01/20 2123)  Labs:  Recent Labs  02/25/15 0526 02/25/15 0856 02/26/15 0504 02/27/15 0651 02/27/15 1644 02/27/15 2250  HGB 7.0*  --  7.3* 7.3*  --   --   HCT 21.3*  --  22.2* 22.7*  --   --   PLT 117*  --  119* 138*  --   --   LABPROT  --  26.0* 25.4* 20.8*  --   --   INR  --  2.41 2.34 1.79  --   --   HEPARINUNFRC  --   --   --   --  0.33 0.51  CREATININE  --  2.07* 1.92* 1.94*  --   --     Estimated Creatinine Clearance: 34.3 mL/min (by C-G formula based on Cr of 1.94).   Medical History: Past Medical History  Diagnosis Date  . Status post kidney transplant 2001  . Hyperparathyroidism ([name])   . DVT (deep venous thrombosis) ([place])   . TIA (transient ischemic attack)   . Essential hypertension   . History of CMV   . Pedal edema   . Rosacea   . Antiphospholipid antibody syndrome ([place])   . Hyperlipidemia   . Vascular dementia without behavioral disturbance    Assessment: Pharmacy consulted to dose and monitor Heparin therapy in this 65 year old male being used to bridge for antiphospholipid syndrome. [name] on warfarin therapy at home   MD does not desire a bolus.   Goal of Therapy:  Heparin level 0.3-0.7 units/ml Monitor platelets by anticoagulation protocol: Yes   Plan:  Heparin level therapeutic. Continue current rate. Pharmacy will monitor daily.   Laural Benes, Pharm.D., BCPS Clinical Pharmacist 02/27/2015,11:54  PM

## 2015-02-27 NOTE — Progress Notes (Signed)
GI Note:  Hgb of 7.1 last night was spurious as evidenced by the Hgb of 9 this morning after 1 u PRBC.

## 2015-02-27 NOTE — Progress Notes (Signed)
ANTICOAGULATION CONSULT NOTE - Initial Consult  Pharmacy Consult for Heparin  Indication: antiphospholipid syndrome, treat as VTE (bridging)    Allergies  Allergen Reactions  . Statins Nausea And Vomiting and Other (See Comments)    Reaction:  Muscle pain     Patient Measurements: Height: 5\' 7"  (170.2 cm) Weight: 142 lb 12.8 oz (64.774 kg) IBW/kg (Calculated) : 66.1 Heparin Dosing Weight: 64.8 kg  Vital Signs: Temp: 98.5 F (36.9 C) (01/20 1334) Temp Source: Oral (01/20 1334) BP: 160/68 mmHg (01/20 1334) Pulse Rate: 98 (01/20 1334)  Labs:  Recent Labs  02/25/15 0526 02/25/15 0856 02/26/15 0504 02/27/15 0651 02/27/15 1644  HGB 7.0*  --  7.3* 7.3*  --   HCT 21.3*  --  22.2* 22.7*  --   PLT 117*  --  119* 138*  --   LABPROT  --  26.0* 25.4* 20.8*  --   INR  --  2.41 2.34 1.79  --   HEPARINUNFRC  --   --   --   --  0.33  CREATININE  --  2.07* 1.92* 1.94*  --     Estimated Creatinine Clearance: 34.3 mL/min (by C-G formula based on Cr of 1.94).   Medical History: Past Medical History  Diagnosis Date  . Status post kidney transplant 2001  . Hyperparathyroidism ([place])   . DVT (deep venous thrombosis) ([place])   . TIA (transient ischemic attack)   . Essential hypertension   . History of CMV   . Pedal edema   . Rosacea   . Antiphospholipid antibody syndrome ([place])   . Hyperlipidemia   . Vascular dementia without behavioral disturbance    Assessment: Pharmacy consulted to dose and monitor Heparin therapy in this 65 year old male being used to bridge for antiphospholipid syndrome. [name] on warfarin therapy at home   MD does not desire a bolus.   Goal of Therapy:  Heparin level 0.3-0.7 units/ml Monitor platelets by anticoagulation protocol: Yes   Plan:  Start heparin infusion at 1050 units/hr Check anti-Xa level in 6 hours and daily while on heparin Continue to monitor H&H and platelets  Heparin level ordered to be drawn @ 17:00.   1/20 HL @ 1700 was  therapeutic at 0.33 units/mL. Will continue with current infusion rate of 1050 units/hr and order confirmatory level in 6 hours. CBC ordered with AM labs tomorrow.   Pharmacy will continue to monitor, thank you for the consult.  Lenis Noon, PharmD Clinical Pharmacist 02/27/2015,5:41 PM

## 2015-02-27 NOTE — Progress Notes (Signed)
[facility]  ROUNDING NOTE   Subjective:  Cr stable at 1.9. INR down to 1.79 today. However patient ate this morning.  Otherwise feeling well.    Objective:  Vital signs in last 24 hours:  Temp:  [97.9 F (36.6 C)-98.2 F (36.8 C)] 97.9 F (36.6 C) (01/20 0503) Pulse Rate:  [72-96] 87 (01/20 0504) Resp:  [18-20] 18 (01/20 0503) BP: (160-179)/(70-83) 179/77 mmHg (01/20 0504) SpO2:  [97 %-99 %] 99 % (01/20 0503)  Weight change:  Filed Weights   02/23/15 1558 02/23/15 1953  Weight: 65.772 kg (145 lb) 64.774 kg (142 lb 12.8 oz)    Intake/Output: I/O last 3 completed shifts: In: 38 [P.O.:720] Out: 300 [Urine:300]   Intake/Output this shift:  Total I/O In: 240 [P.O.:240] Out: -   Physical Exam: General: NAD, resting in bed  Head: Normocephalic, atraumatic. Moist oral mucosal membranes  Eyes: Anicteric  Neck: Supple, trachea midline  Lungs:  Clear to auscultation  Heart: Regular rate and rhythm  Abdomen:  Soft, nontender,   Extremities: no peripheral edema.  Neurologic: Nonfocal, moving all four extremities  Skin: No lesions       Basic Metabolic Panel:  Recent Labs Lab 02/23/15 1612 02/24/15 0854 02/25/15 0856 02/26/15 0504 02/27/15 0651  NA 143 141 140 139 136  K 4.4 3.7 3.4* 3.2* 3.2*  CL 110 111 109 110 105  CO2 25 24 25 26 25   GLUCOSE 104* 88 90 90 92  BUN 65* 51* 35* 24* 20  CREATININE 2.19* 2.16* 2.07* 1.92* 1.94*  CALCIUM 9.5 8.5* 8.7* 8.6* 8.5*  MG  --   --   --   --  1.8  PHOS  --   --   --   --  2.2*    Liver Function Tests:  Recent Labs Lab 02/23/15 1612 02/27/15 0651  AST 19  --   ALT 13*  --   ALKPHOS 64  --   BILITOT 0.6  --   PROT 5.4*  --   ALBUMIN 3.1* 3.1*    Recent Labs Lab 02/23/15 1612  LIPASE 34   No results for input(s): AMMONIA in the last 168 hours.  CBC:  Recent Labs Lab 02/23/15 1612  02/24/15 0854 02/24/15 1638 02/25/15 0526 02/26/15 0504 02/27/15 0651  WBC 8.4  --  5.9  --  6.3  5.9 6.3  HGB 7.3*  < > 7.2* 7.4* 7.0* 7.3* 7.3*  HCT 23.3*  --  22.2*  --  21.3* 22.2* 22.7*  MCV 87.9  --  87.7  --  88.9 89.6 87.3  PLT 130*  --  120*  --  117* 119* 138*  < > = values in this interval not displayed.  Cardiac Enzymes: No results for input(s): CKTOTAL, CKMB, CKMBINDEX, TROPONINI in the last 168 hours.  BNP: Invalid input(s): POCBNP  CBG: No results for input(s): GLUCAP in the last 168 hours.  Microbiology: Results for orders placed or performed during the hospital encounter of 10/29/14  Urine culture     Status: None   Collection Time: 10/29/14  1:01 PM  Result Value Ref Range Status   Specimen Description URINE, RANDOM  Final   Special Requests NONE  Final   Culture 7,000 COLONIES/mL INSIGNIFICANT GROWTH  Final   Report Status 10/31/2014 FINAL  Final    Coagulation Studies:  Recent Labs  02/24/15 1638 02/25/15 0856 02/26/15 0504 02/27/15 0651  LABPROT 26.5* 26.0* 25.4* 20.8*  INR 2.48 2.41 2.34 1.79  Urinalysis: No results for input(s): COLORURINE, LABSPEC, PHURINE, GLUCOSEU, HGBUR, BILIRUBINUR, KETONESUR, PROTEINUR, UROBILINOGEN, NITRITE, LEUKOCYTESUR in the last 72 hours.  Invalid input(s): APPERANCEUR    Imaging: No results found.   Medications:   . heparin 1,050 Units/hr (02/27/15 1146)   . amLODipine  5 mg Oral Q1200  . calcitRIOL  0.25 mcg Oral QHS  . citalopram  10 mg Oral QHS  . donepezil  10 mg Oral QHS  . losartan  50 mg Oral QHS  . montelukast  10 mg Oral QHS  . mycophenolate  500 mg Oral BID  . pantoprazole (PROTONIX) IV  40 mg Intravenous Q12H  . predniSONE  5 mg Oral Daily  . tacrolimus  0.5 mg Oral QHS   ondansetron  Assessment/ Plan:  65 y.o. 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   1. Status post renal transplantation in 2001. The patient has done quite well from the perspective of renal transplantation. Patient received his kidney from his wife. His baseline creatinine is 2.2 from December 2016.  -  Renal function remains stable at the moment, Cr currently 1.94 which is better than his outpt baseline.  Continue cellcept, prednisone, and tacrolimus at its current doseage.   2. Anemia of chronic kidney disease/GI bleed. Pt received blood transfusion this admission. -hgb stable at 7.3, avoid further blood transfusion as possible.   3. Hypertension.  Blood pressure increased at 179/77, increase amlodipine to 10mg  po daily.  4.  Secondary hyperparathyroidism.  Continue calcitriol 0.25 g by mouth daily.   LOS: [place] 1/20/201712:11 PM

## 2015-02-27 NOTE — Care Management Important Message (Signed)
Important Message  Patient Details  Name: Eric Gill MRN: [id number] Date of Birth: 18-Jun-1950   Medicare Important Message Given:  Yes    [name] 02/27/2015, 10:08 AM

## 2015-02-27 NOTE — Plan of Care (Signed)
Problem: Safety: Goal: Ability to remain free from injury will improve Outcome: Progressing Pt steady on his feet, using call light when he needs to get up.  Problem: Skin Integrity: Goal: Risk for impaired skin integrity will decrease Outcome: Progressing No skin break down  Problem: Activity: Goal: Risk for activity intolerance will decrease Outcome: Progressing Pt ambulates well in room.  Problem: Fluid Volume: Goal: Ability to maintain a balanced intake and output will improve Outcome: Progressing Tolerating food and fluids with no further hematochezia.

## 2015-02-27 NOTE — Progress Notes (Signed)
[place] [facility] [place] NAME: Eric Gill    MR#:  [id number]  DATE OF BIRTH:  Aug 02, 1950   Subjective:  Patients  INR had drifted below 2. He ordered he had breakfast so we will be unable to have EGD tomorrow.   REVIEW OF SYSTEMS:   Review of Systems  Constitutional: Negative for fever, chills and weight loss.  HENT: Negative for ear discharge, ear pain and nosebleeds.   Eyes: Negative for blurred vision, pain and discharge.  Respiratory: Negative for sputum production, shortness of breath, wheezing and stridor.   Cardiovascular: Negative for chest pain, palpitations, orthopnea and PND.  Gastrointestinal: Denies any nausea Negative for vomiting, abdominal pain and diarrhea.  Genitourinary: Negative for urgency and frequency.  Musculoskeletal: Negative for back pain and joint pain.  Neurological: Negative for sensory change, speech change, focal weakness and weakness.  Psychiatric/Behavioral: Negative for depression and hallucinations. The patient is not nervous/anxious.   All other systems reviewed and are negative.  Tolerating Diet:npo Tolerating PT: not needed  DRUG ALLERGIES:   Allergies  Allergen Reactions  . Statins Nausea And Vomiting and Other (See Comments)    Reaction:  Muscle pain     VITALS:  Blood pressure 179/77, pulse 87, temperature 97.9 F (36.6 C), temperature source Oral, resp. rate 18, height 5\' 7"  (1.702 m), weight 64.774 kg (142 lb 12.8 oz), SpO2 99 %.  PHYSICAL EXAMINATION:   Physical Exam  GENERAL:  65 y.o.-year-old patient lying in the bed with no acute distress. Pallor+ EYES: Pupils equal, round, reactive to light and accommodation. No scleral icterus. Extraocular muscles intact.  HEENT: Head atraumatic, normocephalic. Oropharynx and nasopharynx clear.  NECK:  Supple, no jugular venous distention. No thyroid enlargement, no tenderness.  LUNGS: Normal breath sounds bilaterally, no wheezing,  rales, rhonchi. No use of accessory muscles of respiration.  CARDIOVASCULAR: S1, S2 normal. No murmurs, rubs, or gallops.  ABDOMEN: Soft, nontender, nondistended. Bowel sounds present. No organomegaly or mass.  EXTREMITIES: No cyanosis, clubbing or edema b/l.    NEUROLOGIC: Cranial nerves II through XII are intact. No focal Motor or sensory deficits b/l.   PSYCHIATRIC: The patient is alert and oriented x 3.  SKIN: No obvious rash, lesion, or ulcer.   LABORATORY PANEL:  CBC  Recent Labs Lab 02/27/15 0651  WBC 6.3  HGB 7.3*  HCT 22.7*  PLT 138*    Chemistries   Recent Labs Lab 02/23/15 1612  02/27/15 0651  NA 143  < > 136  K 4.4  < > 3.2*  CL 110  < > 105  CO2 25  < > 25  GLUCOSE 104*  < > 92  BUN 65*  < > 20  CREATININE 2.19*  < > 1.94*  CALCIUM 9.5  < > 8.5*  MG  --   --  1.8  AST 19  --   --   ALT 13*  --   --   ALKPHOS 64  --   --   BILITOT 0.6  --   --   < > = values in this interval not displayed. ASSESSMENT AND PLAN:  Eric Gill is a 65 y.o. male with a known history of status post kidney transplant, hyperparathyroidism, DVT, TIA, antiphospholipid syndrome, chronic Coumadin use, vascular dementia, hyperlipidemia came in with coffee ground vomitus  * Acute blood loss anemia secondary to most likely upper GI bleed Hemoglobin is 7.3--67.0--6.2--1 unit BT--7.0-7.3-7.3 -Continue continue IV Protonix twice a day -  Hold his Coumadin at this time.  * Status post kidney transplant His renal function is stable and he is on multiple transplant medications. -appreciate nephrology input  * Hypertension Continue amlodipine for now and follow his blood pressure.   * Antiphospholipid syndrome -He also had history of blood clots in the past and TIAs and he was advised to take Coumadin. -Dr. Katheren Puller spoke to patient's hematologist at [facility] they recommended bridging therapy while awaiting endoscopy, his INR is currently subtherapeutic. Until he gets his EGD will  start him on a heparin drip and monitor for any further signs of bleeding.    * TIA and vascular dementia Continue home medications.  Management plans discussed with the patient, family and they are in agreement.  Case discussed with Care Management/Social Worker. Management plans discussed with the patient, family and they are in agreement.  CODE STATUS:full  DVT Prophylaxis: SCD  TOTAL TIME TAKING CARE OF THIS PATIENT: 27mintes.      Note: This dictation was prepared with Dragon dictation along with smaller phrase technology. Any transcriptional errors that result from this process are unintentional.  Dustin Flock M.D on 02/27/2015 at 1:11 PM  Between 7am to 6pm - Pager - [phone]  After 6pm go to [email or web address] - password [email or web address] [facility]  Office  [phone]  CC: Primary care physician; Idelle Crouch, MD

## 2015-02-27 NOTE — Progress Notes (Signed)
Patient informed of NPO order. Patient only ate a few bites of breakfast. Madlyn Frankel, RN

## 2015-02-28 LAB — CBC
HEMATOCRIT: 23.2 % — AB (ref 40.0–52.0)
HEMOGLOBIN: 7.6 g/dL — AB (ref 13.0–18.0)
MCH: 29.2 pg (ref 26.0–34.0)
MCHC: 32.9 g/dL (ref 32.0–36.0)
MCV: 88.9 fL (ref 80.0–100.0)
Platelets: 149 10*3/uL — ABNORMAL LOW (ref 150–440)
RBC: 2.61 MIL/uL — ABNORMAL LOW (ref 4.40–5.90)
RDW: 14.1 % (ref 11.5–14.5)
WBC: 7 10*3/uL (ref 3.8–10.6)

## 2015-02-28 LAB — PROTIME-INR
INR: 1.47
Prothrombin Time: 17.9 seconds — ABNORMAL HIGH (ref 11.4–15.0)

## 2015-02-28 LAB — HEPARIN LEVEL (UNFRACTIONATED): Heparin Unfractionated: 0.54 IU/mL (ref 0.30–0.70)

## 2015-02-28 MED ORDER — CARVEDILOL 6.25 MG PO TABS
6.2500 mg | ORAL_TABLET | Freq: Two times a day (BID) | ORAL | Status: DC
  Administered 2015-02-28 – 2015-03-08 (×16): 6.25 mg via ORAL
  Filled 2015-02-28 (×16): qty 1

## 2015-02-28 NOTE — Consult Note (Signed)
  GI Inpatient Follow-up Note  Patient Identification: Eric Gill is a 65 y.o. male with GI bleeding. Covering for Dr. Rayann Heman.  Subjective: INR now less than 1.5. NPO right now. Scheduled Inpatient Medications:  . amLODipine  10 mg Oral Q1200  . calcitRIOL  0.25 mcg Oral QHS  . citalopram  10 mg Oral QHS  . donepezil  10 mg Oral QHS  . losartan  50 mg Oral QHS  . montelukast  10 mg Oral QHS  . mycophenolate  500 mg Oral BID  . pantoprazole (PROTONIX) IV  40 mg Intravenous Q12H  . predniSONE  5 mg Oral Daily  . tacrolimus  0.5 mg Oral QHS    Continuous Inpatient Infusions:   . heparin 1,050 Units/hr (02/28/15 0906)    PRN Inpatient Medications:  ondansetron  Review of Systems: Constitutional: Weight is stable.  Eyes: No changes in vision. ENT: No oral lesions, sore throat.  GI: see HPI.  Heme/Lymph: No easy bruising.  CV: No chest pain.  GU: No hematuria.  Integumentary: No rashes.  Neuro: No headaches.  Psych: No depression/anxiety.  Endocrine: No heat/cold intolerance.  Allergic/Immunologic: No urticaria.  Resp: No cough, SOB.  Musculoskeletal: No joint swelling.    Physical Examination: BP 173/77 mmHg  Pulse 75  Temp(Src) 98.4 F (36.9 C) (Oral)  Resp 18  Ht 5\' 7"  (1.702 m)  Wt 64.774 kg (142 lb 12.8 oz)  BMI 22.36 kg/m2  SpO2 98% Gen: NAD, alert and oriented x 4 HEENT: PEERLA, EOMI, Neck: supple, no JVD or thyromegaly Chest: CTA bilaterally, no wheezes, crackles, or other adventitious sounds CV: RRR, no m/g/c/r Abd: soft, NT, ND, +BS in all four quadrants; no HSM, guarding, ridigity, or rebound tenderness Ext: no edema, well perfused with 2+ pulses, Skin: no rash or lesions noted Lymph: no LAD  Data: Lab Results  Component Value Date   WBC 7.0 02/28/2015   HGB 7.6* 02/28/2015   HCT 23.2* 02/28/2015   MCV 88.9 02/28/2015   PLT 149* 02/28/2015    Recent Labs Lab 02/26/15 0504 02/27/15 0651 02/28/15 0531  HGB 7.3* 7.3* 7.6*   Lab  Results  Component Value Date   NA 136 02/27/2015   K 3.2* 02/27/2015   CL 105 02/27/2015   CO2 25 02/27/2015   BUN 20 02/27/2015   CREATININE 1.94* 02/27/2015   Lab Results  Component Value Date   ALT 13* 02/23/2015   AST 19 02/23/2015   ALKPHOS 64 02/23/2015   BILITOT 0.6 02/23/2015    Recent Labs Lab 02/28/15 0640  INR 1.47   Assessment/Plan: [name] is a 65 y.o. male with GI bleeding.   Recommendations: Not able to schedule EGD today. Reg diet ordered but NPO after MN for EGD early tomorrow AM. Thanks. Please call with questions or concerns.  Navdeep Halt, Lupita Dawn, MD

## 2015-02-28 NOTE — Progress Notes (Signed)
[facility]  ROUNDING NOTE   Subjective:  INR currently down to 1.4. No new renal function testing today. Patient reports feeling well at the moment.   Objective:  Vital signs in last 24 hours:  Temp:  [98.4 F (36.9 C)-99.1 F (37.3 C)] 98.4 F (36.9 C) (01/21 0531) Pulse Rate:  [68-98] 75 (01/21 0531) Resp:  [18-20] 18 (01/21 0531) BP: (160-173)/(67-77) 173/77 mmHg (01/21 0531) SpO2:  [97 %-98 %] 98 % (01/21 0531)  Weight change:  Filed Weights   02/23/15 1558 02/23/15 1953  Weight: 65.772 kg (145 lb) 64.774 kg (142 lb 12.8 oz)    Intake/Output: I/O last 3 completed shifts: In: 720 [P.O.:720] Out: -    Intake/Output this shift:     Physical Exam: General: NAD, resting in bed  Head: Normocephalic, atraumatic. Moist oral mucosal membranes  Eyes: Anicteric  Neck: Supple, trachea midline  Lungs:  Clear to auscultation, normal effort   Heart: Regular rate and rhythm  Abdomen:  Soft, nontender, BS present  Extremities: no peripheral edema.  Neurologic: Nonfocal, moving all four extremities  Skin: No lesions       Basic Metabolic Panel:  Recent Labs Lab 02/23/15 1612 02/24/15 0854 02/25/15 0856 02/26/15 0504 02/27/15 0651  NA 143 141 140 139 136  K 4.4 3.7 3.4* 3.2* 3.2*  CL 110 111 109 110 105  CO2 25 24 25 26 25   GLUCOSE 104* 88 90 90 92  BUN 65* 51* 35* 24* 20  CREATININE 2.19* 2.16* 2.07* 1.92* 1.94*  CALCIUM 9.5 8.5* 8.7* 8.6* 8.5*  MG  --   --   --   --  1.8  PHOS  --   --   --   --  2.2*    Liver Function Tests:  Recent Labs Lab 02/23/15 1612 02/27/15 0651  AST 19  --   ALT 13*  --   ALKPHOS 64  --   BILITOT 0.6  --   PROT 5.4*  --   ALBUMIN 3.1* 3.1*    Recent Labs Lab 02/23/15 1612  LIPASE 34   No results for input(s): AMMONIA in the last 168 hours.  CBC:  Recent Labs Lab 02/24/15 0854 02/24/15 1638 02/25/15 0526 02/26/15 0504 02/27/15 0651 02/28/15 0531  WBC 5.9  --  6.3 5.9 6.3 7.0  HGB 7.2* 7.4*  7.0* 7.3* 7.3* 7.6*  HCT 22.2*  --  21.3* 22.2* 22.7* 23.2*  MCV 87.7  --  88.9 89.6 87.3 88.9  PLT 120*  --  117* 119* 138* 149*    Cardiac Enzymes: No results for input(s): CKTOTAL, CKMB, CKMBINDEX, TROPONINI in the last 168 hours.  BNP: Invalid input(s): POCBNP  CBG: No results for input(s): GLUCAP in the last 168 hours.  Microbiology: Results for orders placed or performed during the hospital encounter of 10/29/14  Urine culture     Status: None   Collection Time: 10/29/14  1:01 PM  Result Value Ref Range Status   Specimen Description URINE, RANDOM  Final   Special Requests NONE  Final   Culture 7,000 COLONIES/mL INSIGNIFICANT GROWTH  Final   Report Status 10/31/2014 FINAL  Final    Coagulation Studies:  Recent Labs  02/26/15 0504 02/27/15 0651 02/28/15 0640  LABPROT 25.4* 20.8* 17.9*  INR 2.34 1.79 1.47    Urinalysis: No results for input(s): COLORURINE, LABSPEC, PHURINE, GLUCOSEU, HGBUR, BILIRUBINUR, KETONESUR, PROTEINUR, UROBILINOGEN, NITRITE, LEUKOCYTESUR in the last 72 hours.  Invalid input(s): APPERANCEUR    Imaging: No  results found.   Medications:   . heparin 1,050 Units/hr (02/28/15 0906)   . amLODipine  10 mg Oral Q1200  . calcitRIOL  0.25 mcg Oral QHS  . citalopram  10 mg Oral QHS  . donepezil  10 mg Oral QHS  . losartan  50 mg Oral QHS  . montelukast  10 mg Oral QHS  . mycophenolate  500 mg Oral BID  . pantoprazole (PROTONIX) IV  40 mg Intravenous Q12H  . predniSONE  5 mg Oral Daily  . tacrolimus  0.5 mg Oral QHS   ondansetron  Assessment/ Plan:  65 y.o. 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   1. Status post renal transplantation in 2001. The patient has done quite well from the perspective of renal transplantation. Patient received his kidney from his wife. His baseline creatinine is 2.2 from December 2016.  -  No new renal function testing today. Creatinine was 1.94 yesterday which is better than his baseline. Continue prednisone, tacrolimus, and CellCept at the current doses.  2. Anemia of chronic kidney disease/GI bleed. Pt received blood transfusion this admission. -hgb up to 7.6. Avoid blood transfusions to limit his chance of sensitization.  3. Hypertension.  Blood pressure still high at 173/77. Amlodipine was just increased to 10 mg by mouth daily. Continue losartan 50 mg by mouth daily at bedtime as well.  Add coreg 6.25mg  po bid to his medication regimen.  4.  Secondary hyperparathyroidism.  Continue calcitriol 0.25 g by mouth daily.   LOS: 5 Chequita Mofield 1/21/20171:08 PM

## 2015-02-28 NOTE — Progress Notes (Signed)
[place] [facility] [place] NAME: Eric Gill    MR#:  [id number]  DATE OF BIRTH:  [birth year]  SUBJECTIVE:  Doing well. EGD for tomorrow. Wife in the room  REVIEW OF SYSTEMS:   Review of Systems  Constitutional: Negative for fever, chills and weight loss.  HENT: Negative for ear discharge, ear pain and nosebleeds.   Eyes: Negative for blurred vision, pain and discharge.  Respiratory: Negative for sputum production, shortness of breath, wheezing and stridor.   Cardiovascular: Negative for chest pain, palpitations, orthopnea and PND.  Gastrointestinal: Negative for nausea, vomiting, abdominal pain and diarrhea.  Genitourinary: Negative for urgency and frequency.  Musculoskeletal: Negative for back pain and joint pain.  Neurological: Negative for sensory change, speech change, focal weakness and weakness.  Psychiatric/Behavioral: Negative for depression and hallucinations. The patient is not nervous/anxious.   All other systems reviewed and are negative.  Tolerating Diet:yes Tolerating PT: not needed  DRUG ALLERGIES:   Allergies  Allergen Reactions  . Statins Nausea And Vomiting and Other (See Comments)    Reaction:  Muscle pain     VITALS:  Blood pressure 152/75, pulse 74, temperature 98.4 F (36.9 C), temperature source Oral, resp. rate 18, height 5\' 7"  (1.702 m), weight 64.774 kg (142 lb 12.8 oz), SpO2 100 %.  PHYSICAL EXAMINATION:   Physical Exam  GENERAL:  65 y.o.-year-old patient lying in the bed with no acute distress.  EYES: Pupils equal, round, reactive to light and accommodation. No scleral icterus. Extraocular muscles intact.  HEENT: Head atraumatic, normocephalic. Oropharynx and nasopharynx clear.  NECK:  Supple, no jugular venous distention. No thyroid enlargement, no tenderness.  LUNGS: Normal breath sounds bilaterally, no wheezing, rales, rhonchi. No use of accessory muscles of respiration.  CARDIOVASCULAR: S1,  S2 normal. No murmurs, rubs, or gallops.  ABDOMEN: Soft, nontender, nondistended. Bowel sounds present. No organomegaly or mass.  EXTREMITIES: No cyanosis, clubbing or edema b/l.    NEUROLOGIC: Cranial nerves II through XII are intact. No focal Motor or sensory deficits b/l.   PSYCHIATRIC: The patient is alert and oriented x 3.  SKIN: No obvious rash, lesion, or ulcer.   LABORATORY PANEL:  CBC  Recent Labs Lab 02/28/15 0531  WBC 7.0  HGB 7.6*  HCT 23.2*  PLT 149*    Chemistries   Recent Labs Lab 02/23/15 1612  02/27/15 0651  NA 143  < > 136  K 4.4  < > 3.2*  CL 110  < > 105  CO2 25  < > 25  GLUCOSE 104*  < > 92  BUN 65*  < > 20  CREATININE 2.19*  < > 1.94*  CALCIUM 9.5  < > 8.5*  MG  --   --  1.8  AST 19  --   --   ALT 13*  --   --   ALKPHOS 64  --   --   BILITOT 0.6  --   --   < > = values in this interval not displayed.   ASSESSMENT AND PLAN:  Eric Gill is a 65 y.o. male with a known history of status post kidney transplant, hyperparathyroidism, DVT, TIA, antiphospholipid syndrome, chronic Coumadin use, vascular dementia, hyperlipidemia came in with coffee ground vomitus  * Acute blood loss anemia secondary to most likely upper GI bleed Hemoglobin is 7.3--67.0--6.2--1 unit BT--7.0-7.3-7.3 -Continue continue IV Protonix twice a day -Hold his Coumadin at this time.on IV heparin gtt -EGD by Dr Candace Cruise in  am  * Status post kidney transplant His renal function is stable and he is on multiple transplant medications. -appreciate nephrology input -creat stable at 1.9  * Hypertension Continue amlodipine for now and follow his blood pressure.   * Antiphospholipid syndrome -He also had history of blood clots in the past and TIAs and he was advised to take Coumadin. -Dr. Dorise Bullion spoke to patient's hematologist at [facility] they recommended bridging therapy while awaiting endoscopy, his INR is currently subtherapeutic. Until he gets his EGD will start him on a  heparin drip and monitor for any further signs of bleeding.  * TIA and vascular dementia Continue home medications.  Management plans discussed with the patient, family and they are in agreement.   Case discussed with Care Management/Social Worker.  CODE STATUS: full  DVT Prophylaxis:scd, heparin gtt  TOTAL TIME TAKING CARE OF THIS PATIENT:25 minutes.  >50% time spent on counselling and coordination of care pt and wife    Note: This dictation was prepared with Dragon dictation along with smaller phrase technology. Any transcriptional errors that result from this process are unintentional.  Tisa Weisel M.D on 02/28/2015 at 2:11 PM  Between 7am to 6pm - Pager - [phone]  After 6pm go to [email or web address] - password [email or web address] [facility]  Office  [phone]  CC: Primary care physician; Idelle Crouch, MD

## 2015-02-28 NOTE — Progress Notes (Signed)
[name] to have procedure tomorrow at 8 am per patient wife - per Dr. Posey Pronto, heparin drip to be stopped 2 hours prior. Madlyn Frankel, RN

## 2015-03-01 ENCOUNTER — Encounter
Admission: EM | Disposition: A | Discharge status: Home / Self Care | Payer: Self-pay | Source: Home / Self Care | Attending: Internal Medicine

## 2015-03-01 ENCOUNTER — Inpatient Hospital Stay: Payer: Medicare Other | Admitting: Anesthesiology

## 2015-03-01 ENCOUNTER — Encounter: Payer: Self-pay | Admitting: Anesthesiology

## 2015-03-01 HISTORY — PX: ESOPHAGOGASTRODUODENOSCOPY: SHX5428

## 2015-03-01 SURGERY — EGD (ESOPHAGOGASTRODUODENOSCOPY)
Anesthesia: General | Laterality: Left

## 2015-03-01 MED ORDER — MIDAZOLAM HCL 2 MG/2ML IJ SOLN
INTRAMUSCULAR | Status: DC | PRN
  Administered 2015-03-01: 2 mg via INTRAVENOUS

## 2015-03-01 MED ORDER — LIDOCAINE HCL (CARDIAC) 20 MG/ML IV SOLN
INTRAVENOUS | Status: DC | PRN
  Administered 2015-03-01: 60 mg via INTRAVENOUS

## 2015-03-01 MED ORDER — WARFARIN - PHARMACIST DOSING INPATIENT
Freq: Every day | Status: DC
  Administered 2015-03-01 – 2015-03-07 (×6)

## 2015-03-01 MED ORDER — PROPOFOL 500 MG/50ML IV EMUL
INTRAVENOUS | Status: DC | PRN
  Administered 2015-03-01: 160 ug/kg/min via INTRAVENOUS

## 2015-03-01 MED ORDER — PANTOPRAZOLE SODIUM 40 MG PO TBEC
40.0000 mg | DELAYED_RELEASE_TABLET | Freq: Two times a day (BID) | ORAL | Status: DC
  Administered 2015-03-01 – 2015-03-08 (×15): 40 mg via ORAL
  Filled 2015-03-01 (×15): qty 1

## 2015-03-01 MED ORDER — WARFARIN SODIUM 2 MG PO TABS
2.0000 mg | ORAL_TABLET | Freq: Every day | ORAL | Status: DC
  Administered 2015-03-01 – 2015-03-02 (×2): 2 mg via ORAL
  Filled 2015-03-01 (×2): qty 1

## 2015-03-01 MED ORDER — SODIUM CHLORIDE 0.9 % IV SOLN
INTRAVENOUS | Status: DC | PRN
  Administered 2015-03-01: 09:00:00 via INTRAVENOUS

## 2015-03-01 MED ORDER — HEPARIN (PORCINE) IN NACL 100-0.45 UNIT/ML-% IJ SOLN
1050.0000 [IU]/h | INTRAMUSCULAR | Status: DC
  Administered 2015-03-01 – 2015-03-06 (×6): 1050 [IU]/h via INTRAVENOUS
  Filled 2015-03-01 (×9): qty 250

## 2015-03-01 NOTE — Progress Notes (Signed)
[facility]  ROUNDING NOTE   Subjective:  Pt had EGD today.  Tolerated well. Ulcer in pylorus that was noted, not actively bleeding. To be placed back on heparin gtt at 3pm.     Objective:  Vital signs in last 24 hours:  Temp:  [98.2 F (36.8 C)-99 F (37.2 C)] 99 F (37.2 C) (01/22 1250) Pulse Rate:  [57-74] 73 (01/22 1250) Resp:  [13-21] 18 (01/22 1250) BP: (88-172)/(52-77) 116/59 mmHg (01/22 1250) SpO2:  [91 %-100 %] 93 % (01/22 1250)  Weight change:  Filed Weights   02/23/15 1558 02/23/15 1953  Weight: 65.772 kg (145 lb) 64.774 kg (142 lb 12.8 oz)    Intake/Output: I/O last 3 completed shifts: In: 19 [P.O.:360; I.V.:218] Out: -    Intake/Output this shift:  Total I/O In: 100 [I.V.:100] Out: -   Physical Exam: General: NAD, resting in bed  Head: Normocephalic, atraumatic. Moist oral mucosal membranes  Eyes: Anicteric  Neck: Supple, trachea midline  Lungs:  Clear to auscultation, normal effort   Heart: Regular rate and rhythm  Abdomen:  Soft, nontender, BS present  Extremities: no peripheral edema.  Neurologic: Nonfocal, moving all four extremities  Skin: No lesions       Basic Metabolic Panel:  Recent Labs Lab 02/23/15 1612 02/24/15 0854 02/25/15 0856 02/26/15 0504 02/27/15 0651  NA 143 141 140 139 136  K 4.4 3.7 3.4* 3.2* 3.2*  CL 110 111 109 110 105  CO2 25 24 25 26 25   GLUCOSE 104* 88 90 90 92  BUN 65* 51* 35* 24* 20  CREATININE 2.19* 2.16* 2.07* 1.92* 1.94*  CALCIUM 9.5 8.5* 8.7* 8.6* 8.5*  MG  --   --   --   --  1.8  PHOS  --   --   --   --  2.2*    Liver Function Tests:  Recent Labs Lab 02/23/15 1612 02/27/15 0651  AST 19  --   ALT 13*  --   ALKPHOS 64  --   BILITOT 0.6  --   PROT 5.4*  --   ALBUMIN 3.1* 3.1*    Recent Labs Lab 02/23/15 1612  LIPASE 34   No results for input(s): AMMONIA in the last 168 hours.  CBC:  Recent Labs Lab 02/24/15 0854 02/24/15 1638 02/25/15 0526 02/26/15 0504  02/27/15 0651 02/28/15 0531  WBC 5.9  --  6.3 5.9 6.3 7.0  HGB 7.2* 7.4* 7.0* 7.3* 7.3* 7.6*  HCT 22.2*  --  21.3* 22.2* 22.7* 23.2*  MCV 87.7  --  88.9 89.6 87.3 88.9  PLT 120*  --  117* 119* 138* 149*    Cardiac Enzymes: No results for input(s): CKTOTAL, CKMB, CKMBINDEX, TROPONINI in the last 168 hours.  BNP: Invalid input(s): POCBNP  CBG: No results for input(s): GLUCAP in the last 168 hours.  Microbiology: Results for orders placed or performed during the hospital encounter of 10/29/14  Urine culture     Status: None   Collection Time: 10/29/14  1:01 PM  Result Value Ref Range Status   Specimen Description URINE, RANDOM  Final   Special Requests NONE  Final   Culture 7,000 COLONIES/mL INSIGNIFICANT GROWTH  Final   Report Status 10/31/2014 FINAL  Final    Coagulation Studies:  Recent Labs  02/27/15 0651 02/28/15 0640  LABPROT 20.8* 17.9*  INR 1.79 1.47    Urinalysis: No results for input(s): COLORURINE, LABSPEC, PHURINE, GLUCOSEU, HGBUR, BILIRUBINUR, KETONESUR, PROTEINUR, UROBILINOGEN, NITRITE, LEUKOCYTESUR in the  last 72 hours.  Invalid input(s): APPERANCEUR    Imaging: No results found.   Medications:   . heparin     . amLODipine  10 mg Oral Q1200  . calcitRIOL  0.25 mcg Oral QHS  . carvedilol  6.25 mg Oral BID WC  . citalopram  10 mg Oral QHS  . donepezil  10 mg Oral QHS  . losartan  50 mg Oral QHS  . montelukast  10 mg Oral QHS  . mycophenolate  500 mg Oral BID  . pantoprazole  40 mg Oral BID AC  . predniSONE  5 mg Oral Daily  . tacrolimus  0.5 mg Oral QHS  . warfarin  2 mg Oral q1800  . Warfarin - Pharmacist Dosing Inpatient   Does not apply q1800   ondansetron  Assessment/ Plan:  65 y.o. 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   1. Status post renal transplantation in 2001. The patient has done quite well from the perspective of renal transplantation. Patient received his kidney from his wife. His baseline creatinine is 2.2 from December 2016.  -  No new renal function testing again today, would recheck renal function tomorrow, continue current doses of cellcept, tacrolimus, and prednisone.  2. Anemia of chronic kidney disease/GI bleed. Pt received blood transfusion this admission.  Ulcer noted in pylorus. -hgb up to 7.6. Avoid blood transfusions to limit his chance of sensitization.  Had EGD today, findings above.  Pt to be restarted on heparin gtt as bridge to coumadin as he has underlying antiphospholipid syndrome.  3. Hypertension.  BP 116/59 now, continue coreg, losartan, and amlodipine.  4.  Secondary hyperparathyroidism.  Continue calcitriol 0.25 g by mouth daily.   LOS: 6 Diania Co 1/22/20171:13 PM

## 2015-03-01 NOTE — Anesthesia Postprocedure Evaluation (Signed)
Anesthesia Post Note  Patient: Eric Gill  Procedure(s) Performed: Procedure(s) (LRB): ESOPHAGOGASTRODUODENOSCOPY (EGD) (Left)  Patient location during evaluation: Endoscopy Anesthesia Type: General Level of consciousness: awake and alert Pain management: pain level controlled Vital Signs Assessment: post-procedure vital signs reviewed and stable Respiratory status: spontaneous breathing, nonlabored ventilation, respiratory function stable and patient connected to nasal cannula oxygen Cardiovascular status: blood pressure returned to baseline and stable Postop Assessment: no signs of nausea or vomiting Anesthetic complications: no    Last Vitals:  Filed Vitals:   03/01/15 0930 03/01/15 0940  BP: 88/52 109/61  Pulse: 62 65  Temp:    Resp: 13 19    Last Pain:  Filed Vitals:   03/01/15 0941  PainSc: Asleep                 Precious Haws Piscitello

## 2015-03-01 NOTE — Op Note (Signed)
EGD showed antritis, polyp in the fundus, which may be either inflammatory or adenomatous. This was biopsied. He also had  Pyloric channel ulcer with clean base, which is the likely source of recent bleeding. Can resume heparin drip by 3 PM today. Can resume coumadin tonight but do it SLOWLY. Due to chronic coumadin use, pt will likely need PPI or H2 blocker daily long term. Reg diet ordered. Dr. Rayann Heman to check on pt tomorrow. thanks

## 2015-03-01 NOTE — Transfer of Care (Signed)
Immediate Anesthesia Transfer of Care Note  Patient: Eric Gill  Procedure(s) Performed: Procedure(s): ESOPHAGOGASTRODUODENOSCOPY (EGD) (Left)  Patient Location: PACU  Anesthesia Type:General  Level of Consciousness: sedated  Airway & Oxygen Therapy: Patient Spontanous Breathing and Patient connected to nasal cannula oxygen  Post-op Assessment: Report given to RN and Post -op Vital signs reviewed and stable  Post vital signs: Reviewed and stable  Last Vitals: 0901 - 64 hr 127/71 bp 100% sat 63 hr 18 resp  Filed Vitals:   03/01/15 0735 03/01/15 0847  BP: 138/64 130/77  Pulse: 72 70  Temp:  37 C  Resp:  21    Complications: No apparent anesthesia complications

## 2015-03-01 NOTE — Progress Notes (Signed)
ANTICOAGULATION CONSULT NOTE - Initial Consult  Pharmacy Consult for Heparin  Indication: antiphospholipid syndrome, treat as VTE (bridging)    Allergies  Allergen Reactions  . Statins Nausea And Vomiting and Other (See Comments)    Reaction:  Muscle pain     Patient Measurements: Height: 5\' 7"  (170.2 cm) Weight: 142 lb 12.8 oz (64.774 kg) IBW/kg (Calculated) : 66.1 Heparin Dosing Weight: 64.8 kg  Vital Signs: Temp: 98.6 F (37 C) (01/21 2058) Temp Source: Oral (01/21 [id number]) BP: 172/65 mmHg (01/21 2058) Pulse Rate: 64 (01/21 2058)  Labs:  Recent Labs  02/26/15 0504 02/27/15 [id number] 02/27/15 1644 02/27/15 2250 02/28/15 0531 02/28/15 0640 02/28/15 2212  HGB 7.3* 7.3*  --   --  7.6*  --   --   HCT 22.2* 22.7*  --   --  23.2*  --   --   PLT 119* 138*  --   --  149*  --   --   LABPROT 25.4* 20.8*  --   --   --  17.9*  --   INR 2.34 1.79  --   --   --  1.47  --   HEPARINUNFRC  --   --  0.33 0.51  --   --  0.54  CREATININE 1.92* 1.94*  --   --   --   --   --     Estimated Creatinine Clearance: 34.3 mL/min (by C-G formula based on Cr of 1.94).   Medical History: Past Medical History  Diagnosis Date  . Status post kidney transplant 2001  . Hyperparathyroidism ([place])   . DVT (deep venous thrombosis) ([place])   . TIA (transient ischemic attack)   . Essential hypertension   . History of CMV   . Pedal edema   . Rosacea   . Antiphospholipid antibody syndrome ([place])   . Hyperlipidemia   . Vascular dementia without behavioral disturbance    Assessment: Pharmacy consulted to dose and monitor Heparin therapy in this 65 year old male being used to bridge for antiphospholipid syndrome. [name] on warfarin therapy at home   MD does not desire a bolus.   Goal of Therapy:  Heparin level 0.3-0.7 units/ml Monitor platelets by anticoagulation protocol: Yes   Plan:  Heparin level therapeutic. Continue current rate. Pharmacy will monitor daily.   Laural Benes, Pharm.D.,  BCPS Clinical Pharmacist 03/01/2015,12:08 AM

## 2015-03-01 NOTE — Anesthesia Preprocedure Evaluation (Signed)
Anesthesia Evaluation  Patient identified by MRN, date of birth, ID band Patient awake    Reviewed: Allergy & Precautions, H&P , NPO status , [name] Chart, lab work & pertinent test results  History of Anesthesia Complications Negative for: history of anesthetic complications  Airway Mallampati: III  TM Distance: >3 FB Neck ROM: full    Dental  (+) Poor Dentition, Chipped, Missing, Upper Dentures   Pulmonary neg shortness of breath, former smoker,    Pulmonary exam normal breath sounds clear to auscultation       Cardiovascular Exercise Tolerance: Good hypertension, (-) angina(-) Past MI and (-) DOE Normal cardiovascular exam Rhythm:regular Rate:Normal     Neuro/Psych PSYCHIATRIC DISORDERS TIACVA, Residual Symptoms    GI/Hepatic negative GI ROS, Neg liver ROS,   Endo/Other  negative endocrine ROS  Renal/GU negative Renal ROS  negative genitourinary   Musculoskeletal   Abdominal   Peds  Hematology negative hematology ROS (+)   Anesthesia Other Findings GI bleed  [name] NPO appropriate and reports no nausea or vomiting today.  Past Medical History:   Status post kidney transplant                   2001         Hyperparathyroidism ([place])                                    DVT (deep venous thrombosis) ([facility])                           TIA (transient ischemic attack)                              Essential hypertension                                       History of CMV                                               Pedal edema                                                  Rosacea                                                      Antiphospholipid antibody syndrome ([facility])                     Hyperlipidemia                                               Vascular dementia without behavioral disturban*  History reviewed. No pertinent surgical history.  BMI    Body Mass Index   22.36 kg/m 2       Reproductive/Obstetrics negative OB ROS                             Anesthesia Physical Anesthesia Plan  ASA: IV  Anesthesia Plan: General   Post-op Pain Management:    Induction:   Airway Management Planned:   Additional Equipment:   Intra-op Plan:   Post-operative Plan:   Informed Consent: I have reviewed the patients History and Physical, chart, labs and discussed the procedure including the risks, benefits and alternatives for the proposed anesthesia with the patient or authorized representative who has indicated his/her understanding and acceptance.   Dental Advisory Given  Plan Discussed with: Anesthesiologist, CRNA and Surgeon  Anesthesia Plan Comments:         Anesthesia Quick Evaluation

## 2015-03-01 NOTE — Progress Notes (Signed)
0600 Heparin gtt stopped in preparation for EGD today.  Reminded pt to remain NPO.  Pt agrees and compliant with NPO. Dorna Bloom RN

## 2015-03-01 NOTE — Plan of Care (Signed)
Problem: Safety: Goal: Ability to remain free from injury will improve Outcome: Progressing Pt moderate fall risk.  Pt ambulated around unit with stand by assist.  Frequent monitoring of pt and pt compliant with use of call light.  Problem: Skin Integrity: Goal: Risk for impaired skin integrity will decrease Outcome: Progressing Pt ambulatory and able to move frequently in bed or sit up to chair.  Problem: Activity: Goal: Risk for activity intolerance will decrease Outcome: Progressing Pt ambulated around unit without any difficulty  Problem: Fluid Volume: Goal: Ability to maintain a balanced intake and output will improve Outcome: Progressing Pt tolerating diet and plenty of fluids.  Made NPO at midnight for EGD today.  Problem: Bowel/Gastric: Goal: Will show no signs and symptoms of gastrointestinal bleeding Outcome: Progressing No further s/s of GIB.  Abdomen is soft, nontender.

## 2015-03-01 NOTE — Op Note (Signed)
[facility] Gastroenterology Patient Name: Barrie Schwitzer Procedure Date: 03/01/2015 8:32 AM MRN: [id number] Account #: [id number] Date of Birth: October 04, 1950 Admit Type: Inpatient Age: 65 Room: [facility] ENDO ROOM 4 Gender: Male Note Status: Finalized Procedure:         Upper GI endoscopy Indications:       Acute post hemorrhagic anemia, Melena, On coumadin Providers:         Lupita Dawn. Candace Cruise, MD Referring MD:      Leonie Douglas. Doy Hutching, MD (Referring MD) Medicines:         Monitored Anesthesia Care Complications:     No immediate complications. Procedure:         Pre-Anesthesia Assessment:                    - Prior to the procedure, a History and Physical was                     performed, and patient medications, allergies and                     sensitivities were reviewed. The patient's tolerance of                     previous anesthesia was reviewed.                    - The risks and benefits of the procedure and the sedation                     options and risks were discussed with the patient. All                     questions were answered and informed consent was obtained.                    - After reviewing the risks and benefits, the patient was                     deemed in satisfactory condition to undergo the procedure.                    After obtaining informed consent, the endoscope was passed                     under direct vision. Throughout the procedure, the                     patient's blood pressure, pulse, and oxygen saturations                     were monitored continuously. The Endoscope was introduced                     through the mouth, and advanced to the second part of                     duodenum. The upper GI endoscopy was accomplished without                     difficulty. The patient tolerated the procedure well. Findings:      The examined esophagus was normal.      A single small pedunculated and sessile polyp was found in the gastric        fundus.  Biopsies were taken with a cold forceps for histology.      Localized moderate inflammation was found in the gastric antrum.      One cratered gastric ulcer was found at the pylorus. This ulcer has a       clean base. However, this ulcer likely bled recently      The exam was otherwise without abnormality.      The examined duodenum was normal. Impression:        - Normal esophagus.                    - A single gastric polyp. Biopsied.                    - Gastritis.                    - Gastric ulcer.                    - The examination was otherwise normal.                    - Normal examined duodenum. Recommendation:    - Observe patient's clinical course.                    - Continue present medications.                    - The findings and recommendations were discussed with the                     patient's family.                    - Can resume heparin in 6 hours. Would resume coumadin                     slowly tonight. Procedure Code(s): --- Professional ---                    [phone], Esophagogastroduodenoscopy, flexible, transoral;                     with biopsy, single or multiple Diagnosis Code(s): --- Professional ---                    K31.7, Polyp of stomach and duodenum                    K29.70, Gastritis, unspecified, without bleeding                    K25.9, Gastric ulcer, unspecified as acute or chronic,                     without hemorrhage or perforation                    D62, Acute posthemorrhagic anemia                    K92.1, Melena CPT copyright 2014 American Medical Association. All rights reserved. The codes documented in this report are preliminary and upon coder review may  be revised to meet current compliance requirements. Hulen Luster, MD 03/01/2015 9:03:54 AM This report has been signed electronically. Number of Addenda: 0 Note Initiated On: 03/01/2015 8:32 AM      [facility]

## 2015-03-01 NOTE — Progress Notes (Signed)
[place] [facility] [place] NAME: Eric Gill    MR#:  [id number]  DATE OF BIRTH:  1950/09/30  SUBJECTIVE:  Doing well. S/p EGD tolerated well  REVIEW OF SYSTEMS:   Review of Systems  Constitutional: Negative for fever, chills and weight loss.  HENT: Negative for ear discharge, ear pain and nosebleeds.   Eyes: Negative for blurred vision, pain and discharge.  Respiratory: Negative for sputum production, shortness of breath, wheezing and stridor.   Cardiovascular: Negative for chest pain, palpitations, orthopnea and PND.  Gastrointestinal: Negative for nausea, vomiting, abdominal pain and diarrhea.  Genitourinary: Negative for urgency and frequency.  Musculoskeletal: Negative for back pain and joint pain.  Neurological: Negative for sensory change, speech change, focal weakness and weakness.  Psychiatric/Behavioral: Negative for depression and hallucinations. The patient is not nervous/anxious.   All other systems reviewed and are negative.  Tolerating Diet:yes Tolerating PT: not needed  DRUG ALLERGIES:   Allergies  Allergen Reactions  . Statins Nausea And Vomiting and Other (See Comments)    Reaction:  Muscle pain     VITALS:  Blood pressure 123/69, pulse 57, temperature 98.3 F (36.8 C), temperature source Oral, resp. rate 20, height 5\' 7"  (1.702 m), weight 64.774 kg (142 lb 12.8 oz), SpO2 96 %.  PHYSICAL EXAMINATION:   Physical Exam  GENERAL:  65 y.o.-year-old patient lying in the bed with no acute distress.  EYES: Pupils equal, round, reactive to light and accommodation. No scleral icterus. Extraocular muscles intact.  HEENT: Head atraumatic, normocephalic. Oropharynx and nasopharynx clear.  NECK:  Supple, no jugular venous distention. No thyroid enlargement, no tenderness.  LUNGS: Normal breath sounds bilaterally, no wheezing, rales, rhonchi. No use of accessory muscles of respiration.  CARDIOVASCULAR: S1, S2 normal. No  murmurs, rubs, or gallops.  ABDOMEN: Soft, nontender, nondistended. Bowel sounds present. No organomegaly or mass.  EXTREMITIES: No cyanosis, clubbing or edema b/l.    NEUROLOGIC: Cranial nerves II through XII are intact. No focal Motor or sensory deficits b/l.   PSYCHIATRIC: The patient is alert and oriented x 3.  SKIN: No obvious rash, lesion, or ulcer.   LABORATORY PANEL:  CBC  Recent Labs Lab 02/28/15 0531  WBC 7.0  HGB 7.6*  HCT 23.2*  PLT 149*    Chemistries   Recent Labs Lab 02/23/15 1612  02/27/15 0651  NA 143  < > 136  K 4.4  < > 3.2*  CL 110  < > 105  CO2 25  < > 25  GLUCOSE 104*  < > 92  BUN 65*  < > 20  CREATININE 2.19*  < > 1.94*  CALCIUM 9.5  < > 8.5*  MG  --   --  1.8  AST 19  --   --   ALT 13*  --   --   ALKPHOS 64  --   --   BILITOT 0.6  --   --   < > = values in this interval not displayed.   ASSESSMENT AND PLAN:  Eric Gill is a 65 y.o. male with a known history of status post kidney transplant, hyperparathyroidism, DVT, TIA, antiphospholipid syndrome, chronic Coumadin use, vascular dementia, hyperlipidemia came in with coffee ground vomitus  * Acute blood loss anemia secondary to most likely upper GI bleed Hemoglobin is 7.3--67.0--6.2--1 unit BT--7.0-7.3-7.3--7.6 -Continue  po Protonix twice a day -resume Coumadin at 2mg  tonite time and resume IV heparin gtt at 3 pm today -EGD by  Dr Candace Cruise showed  antritis, polyp in the fundus, which may be either inflammatory or adenomatous and pyloric channel ulcer-clean based  * Status post kidney transplant His renal function is stable and he is on multiple transplant medications. -appreciate nephrology input -creat stable at 1.9  * Hypertension Continue amlodipine for now and follow his blood pressure.   * Antiphospholipid syndrome -He also had history of blood clots in the past and TIAs and he was advised to take Coumadin. -Dr. Dorise Bullion spoke to patient's hematologist at [facility] they recommended  bridging therapy while awaiting endoscopy, his INR is currently subtherapeutic. Until he gets his EGD will start him on a heparin drip and monitor for any further signs of bleeding.  * TIA and vascular dementia Continue home medications.  Management plans discussed with the patient, family and they are in agreement.  Consider change to coumadin and lovenox (for bridging ) if possible for pt to go home  Case discussed with Care Management/Social Worker.  CODE STATUS: full  DVT Prophylaxis:scd, heparin gtt  TOTAL TIME TAKING CARE OF THIS PATIENT:25 minutes.  >50% time spent on counselling and coordination of care pt and wife    Note: This dictation was prepared with Dragon dictation along with smaller phrase technology. Any transcriptional errors that result from this process are unintentional.  Cameron Schwinn M.D on 03/01/2015 at 12:17 PM  Between 7am to 6pm - Pager - [phone]  After 6pm go to [email or web address] - password [email or web address] [facility]  Office  [phone]  CC: Primary care physician; Idelle Crouch, MD

## 2015-03-01 NOTE — Progress Notes (Addendum)
ANTICOAGULATION CONSULT NOTE - Initial Consult  Pharmacy Consult for Warfarin  Indication: anti phospholipid syndrome (treat as VTE)  Allergies  Allergen Reactions  . Statins Nausea And Vomiting and Other (See Comments)    Reaction:  Muscle pain     Patient Measurements: Height: 5\' 7"  (170.2 cm) Weight: 142 lb 12.8 oz (64.774 kg) IBW/kg (Calculated) : 66.1 Heparin Dosing Weight:   Vital Signs: Temp: 98.3 F (36.8 C) (01/22 1015) Temp Source: Oral (01/22 1015) BP: 123/69 mmHg (01/22 1015) Pulse Rate: 57 (01/22 1015)  Labs:  Recent Labs  02/27/15 0651 02/27/15 1644 02/27/15 2250 02/28/15 0531 02/28/15 0640 02/28/15 2212  HGB 7.3*  --   --  7.6*  --   --   HCT 22.7*  --   --  23.2*  --   --   PLT 138*  --   --  149*  --   --   LABPROT 20.8*  --   --   --  17.9*  --   INR 1.79  --   --   --  1.47  --   HEPARINUNFRC  --  0.33 0.51  --   --  0.54  CREATININE 1.94*  --   --   --   --   --     Estimated Creatinine Clearance: 34.3 mL/min (by C-G formula based on Cr of 1.94).   Medical History: Past Medical History  Diagnosis Date  . Status post kidney transplant 2001  . Hyperparathyroidism ([place])   . DVT (deep venous thrombosis) ([place])   . TIA (transient ischemic attack)   . Essential hypertension   . History of CMV   . Pedal edema   . Rosacea   . Antiphospholipid antibody syndrome ([place])   . Hyperlipidemia   . Vascular dementia without behavioral disturbance     Medications:  Scheduled:  . amLODipine  10 mg Oral Q1200  . calcitRIOL  0.25 mcg Oral QHS  . carvedilol  6.25 mg Oral BID WC  . citalopram  10 mg Oral QHS  . donepezil  10 mg Oral QHS  . losartan  50 mg Oral QHS  . montelukast  10 mg Oral QHS  . mycophenolate  500 mg Oral BID  . pantoprazole  40 mg Oral BID AC  . predniSONE  5 mg Oral Daily  . tacrolimus  0.5 mg Oral QHS  . warfarin  2 mg Oral q1800  . Warfarin - Pharmacist Dosing Inpatient   Does not apply q1800    Assessment: Pharmacy  consulted to dose warfarin for antiphospholipid syndrome but treated as VTE  in this 65 year old male.  Pt previously on heparin gtt , Dr Posey Pronto wants to stop heparin gtt on 1/22 @ 15:00 and start warfarin 2 mg.   Goal of Therapy:  INR 2-3   Plan:  Will stop Heparin drip on 1/22 @ 15:00.  Will order Warfarin 2 mg PO daily to start 1/22 @ 18:00.  Will check INR on 1/23 with AM labs.  Will check daily INR and CBC .   Jaala Bohle D 03/01/2015,12:23 PM

## 2015-03-02 ENCOUNTER — Encounter: Payer: Self-pay | Admitting: Gastroenterology

## 2015-03-02 LAB — HEPARIN LEVEL (UNFRACTIONATED)
HEPARIN UNFRACTIONATED: 0.6 [IU]/mL (ref 0.30–0.70)
Heparin Unfractionated: 0.51 IU/mL (ref 0.30–0.70)

## 2015-03-02 LAB — CBC
HEMATOCRIT: 23.9 % — AB (ref 40.0–52.0)
HEMOGLOBIN: 7.8 g/dL — AB (ref 13.0–18.0)
MCH: 28.7 pg (ref 26.0–34.0)
MCHC: 32.4 g/dL (ref 32.0–36.0)
MCV: 88.5 fL (ref 80.0–100.0)
Platelets: 157 10*3/uL (ref 150–440)
RBC: 2.71 MIL/uL — ABNORMAL LOW (ref 4.40–5.90)
RDW: 13.7 % (ref 11.5–14.5)
WBC: 7.3 10*3/uL (ref 3.8–10.6)

## 2015-03-02 LAB — PROTIME-INR
INR: 1.31
PROTHROMBIN TIME: 16.4 s — AB (ref 11.4–15.0)

## 2015-03-02 NOTE — Progress Notes (Signed)
ANTICOAGULATION CONSULT NOTE - Follow Up Consult  Pharmacy Consult for Heparin Indication: DVT, antiphospholipid syndrome  Allergies  Allergen Reactions  . Statins Nausea And Vomiting and Other (See Comments)    Reaction:  Muscle pain     Patient Measurements: Height: 5\' 7"  (170.2 cm) Weight: 142 lb 12.8 oz (64.774 kg) IBW/kg (Calculated) : 66.1 Heparin Dosing Weight: 64.8 kg  Vital Signs: Temp: 99.7 F (37.6 C) (01/23 1216) Temp Source: Oral (01/23 1216) BP: 129/59 mmHg (01/23 1216) Pulse Rate: 61 (01/23 1216)  Labs:  Recent Labs  02/27/15 2250 02/28/15 0531 02/28/15 0640 02/28/15 2212 03/02/15 0451 03/02/15 1028  HGB  --  7.6*  --   --  7.8*  --   HCT  --  23.2*  --   --  23.9*  --   PLT  --  149*  --   --  157  --   LABPROT  --   --  17.9*  --  16.4*  --   INR  --   --  1.47  --  1.31  --   HEPARINUNFRC 0.51  --   --  0.54  --  0.51    Estimated Creatinine Clearance: 34.3 mL/min (by C-G formula based on Cr of 1.94).   Medications:  Scheduled:  . amLODipine  10 mg Oral Q1200  . calcitRIOL  0.25 mcg Oral QHS  . carvedilol  6.25 mg Oral BID WC  . citalopram  10 mg Oral QHS  . donepezil  10 mg Oral QHS  . losartan  50 mg Oral QHS  . montelukast  10 mg Oral QHS  . mycophenolate  500 mg Oral BID  . pantoprazole  40 mg Oral BID AC  . predniSONE  5 mg Oral Daily  . tacrolimus  0.5 mg Oral QHS  . warfarin  2 mg Oral q1800  . Warfarin - Pharmacist Dosing Inpatient   Does not apply q1800   Infusions:  . heparin 1,050 Units/hr (03/02/15 1340)    Assessment: Patient restarted on Coumadin with Heparin bridge on 1/22.  1/23 HL=0.51  Goal of Therapy:  Heparin level 0.3-0.7 units/ml Monitor platelets by anticoagulation protocol: Yes   Plan:  Will continue with current rate and check a confirmatory level at 17:00.   Paulina Fusi, PharmD, BCPS 03/02/2015 2:22 PM

## 2015-03-02 NOTE — Progress Notes (Signed)
ANTICOAGULATION CONSULT NOTE - Follow Up Consult  Pharmacy Consult for Heparin Indication: DVT, antiphospholipid syndrome  Allergies  Allergen Reactions  . Statins Nausea And Vomiting and Other (See Comments)    Reaction:  Muscle pain     Patient Measurements: Height: 5\' 7"  (170.2 cm) Weight: 142 lb 12.8 oz (64.774 kg) IBW/kg (Calculated) : 66.1 Heparin Dosing Weight: 64.8 kg  Vital Signs: Temp: 98 F (36.7 C) (01/23 1700) Temp Source: Oral (01/23 1700) BP: 133/59 mmHg (01/23 1700) Pulse Rate: 68 (01/23 1700)  Labs:  Recent Labs  02/28/15 0531 02/28/15 0640 02/28/15 2212 03/02/15 0451 03/02/15 1028 03/02/15 1722  HGB 7.6*  --   --  7.8*  --   --   HCT 23.2*  --   --  23.9*  --   --   PLT 149*  --   --  157  --   --   LABPROT  --  17.9*  --  16.4*  --   --   INR  --  1.47  --  1.31  --   --   HEPARINUNFRC  --   --  0.54  --  0.51 0.60    Estimated Creatinine Clearance: 34.3 mL/min (by C-G formula based on Cr of 1.94).   Medications:  Scheduled:  . amLODipine  10 mg Oral Q1200  . calcitRIOL  0.25 mcg Oral QHS  . carvedilol  6.25 mg Oral BID WC  . citalopram  10 mg Oral QHS  . donepezil  10 mg Oral QHS  . losartan  50 mg Oral QHS  . montelukast  10 mg Oral QHS  . mycophenolate  500 mg Oral BID  . pantoprazole  40 mg Oral BID AC  . predniSONE  5 mg Oral Daily  . tacrolimus  0.5 mg Oral QHS  . warfarin  2 mg Oral q1800  . Warfarin - Pharmacist Dosing Inpatient   Does not apply q1800   Infusions:  . heparin 1,050 Units/hr (03/02/15 1340)    Assessment: Patient restarted on Coumadin with Heparin bridge on 1/22.  1/23 HL=0.51 1/23 HL @ 1722 = 0.6  Goal of Therapy:  Heparin level 0.3-0.7 units/ml Monitor platelets by anticoagulation protocol: Yes   Plan:  Will continue with current rate of 1050 units/hr and check next level with am labs.  Chinita Greenland PharmD Clinical Pharmacist 03/02/2015  6:09 PM

## 2015-03-02 NOTE — Progress Notes (Addendum)
ANTICOAGULATION CONSULT NOTE - Follow Up Consult  Pharmacy Consult for Coumadin Indication: Antiphospholipid symdrome, DVT  Allergies  Allergen Reactions  . Statins Nausea And Vomiting and Other (See Comments)    Reaction:  Muscle pain     Patient Measurements: Height: 5\' 7"  (170.2 cm) Weight: 142 lb 12.8 oz (64.774 kg) IBW/kg (Calculated) : 66.1 Heparin Dosing Weight: 64.8 kg  Vital Signs: Temp: 99.7 F (37.6 C) (01/23 1216) Temp Source: Oral (01/23 1216) BP: 129/59 mmHg (01/23 1216) Pulse Rate: 61 (01/23 1216)  Labs:  Recent Labs  02/27/15 2250 02/28/15 0531 02/28/15 0640 02/28/15 2212 03/02/15 0451 03/02/15 1028  HGB  --  7.6*  --   --  7.8*  --   HCT  --  23.2*  --   --  23.9*  --   PLT  --  149*  --   --  157  --   LABPROT  --   --  17.9*  --  16.4*  --   INR  --   --  1.47  --  1.31  --   HEPARINUNFRC 0.51  --   --  0.54  --  0.51    Estimated Creatinine Clearance: 34.3 mL/min (by C-G formula based on Cr of 1.94).   Medications:  Scheduled:  . amLODipine  10 mg Oral Q1200  . calcitRIOL  0.25 mcg Oral QHS  . carvedilol  6.25 mg Oral BID WC  . citalopram  10 mg Oral QHS  . donepezil  10 mg Oral QHS  . losartan  50 mg Oral QHS  . montelukast  10 mg Oral QHS  . mycophenolate  500 mg Oral BID  . pantoprazole  40 mg Oral BID AC  . predniSONE  5 mg Oral Daily  . tacrolimus  0.5 mg Oral QHS  . warfarin  2 mg Oral q1800  . Warfarin - Pharmacist Dosing Inpatient   Does not apply q1800   Infusions:  . heparin 1,050 Units/hr (03/02/15 1340)    Assessment: Patient restarted on Coumadin with Heparin bridge on 1/22.   1/23 INR=1.31  Goal of Therapy:  INR 2-3 Monitor platelets by anticoagulation protocol: Yes   Plan:  Will continue with Coumadin 2mg  daily, will check a daily INR and adjust does accordingly.  Paulina Fusi, PharmD, BCPS 03/02/2015 2:18 PM

## 2015-03-02 NOTE — Progress Notes (Signed)
[facility]  ROUNDING NOTE   Subjective:   Endoscopy yesterday 1/22 by Dr. Candace Cruise. Found gastric polyp and ulcer. Recommendation of PPI/H2 blocker long term  Objective:  Vital signs in last 24 hours:  Temp:  [98 F (36.7 C)-99.7 F (37.6 C)] 99.7 F (37.6 C) (01/23 1216) Pulse Rate:  [60-76] 61 (01/23 1216) Resp:  [14-18] 16 (01/23 1216) BP: (120-142)/(57-72) 129/59 mmHg (01/23 1216) SpO2:  [95 %-99 %] 95 % (01/23 1216)  Weight change:  Filed Weights   02/23/15 1558 02/23/15 1953  Weight: 65.772 kg (145 lb) 64.774 kg (142 lb 12.8 oz)    Intake/Output: I/O last 3 completed shifts: In: 558 [P.O.:240; I.V.:318] Out: -    Intake/Output this shift:     Physical Exam: General: NAD, laying in bed  Head: Normocephalic, atraumatic. Moist oral mucosal membranes  Eyes: Anicteric  Neck: Supple, trachea midline  Lungs:  Clear to auscultation, normal effort   Heart: Regular rate and rhythm  Abdomen:  Soft, nontender, BS present, right lower quadrant allograft kidney  Extremities: no peripheral edema.  Neurologic: Nonfocal, moving all four extremities  Skin: No lesions       Basic Metabolic Panel:  Recent Labs Lab 02/23/15 1612 02/24/15 0854 02/25/15 0856 02/26/15 0504 02/27/15 0651  NA 143 141 140 139 136  K 4.4 3.7 3.4* 3.2* 3.2*  CL 110 111 109 110 105  CO2 25 24 25 26 25   GLUCOSE 104* 88 90 90 92  BUN 65* 51* 35* 24* 20  CREATININE 2.19* 2.16* 2.07* 1.92* 1.94*  CALCIUM 9.5 8.5* 8.7* 8.6* 8.5*  MG  --   --   --   --  1.8  PHOS  --   --   --   --  2.2*    Liver Function Tests:  Recent Labs Lab 02/23/15 1612 02/27/15 0651  AST 19  --   ALT 13*  --   ALKPHOS 64  --   BILITOT 0.6  --   PROT 5.4*  --   ALBUMIN 3.1* 3.1*    Recent Labs Lab 02/23/15 1612  LIPASE 34   No results for input(s): AMMONIA in the last 168 hours.  CBC:  Recent Labs Lab 02/25/15 0526 02/26/15 0504 02/27/15 0651 02/28/15 0531 03/02/15 0451  WBC 6.3 5.9  6.3 7.0 7.3  HGB 7.0* 7.3* 7.3* 7.6* 7.8*  HCT 21.3* 22.2* 22.7* 23.2* 23.9*  MCV 88.9 89.6 87.3 88.9 88.5  PLT 117* 119* 138* 149* 157    Cardiac Enzymes: No results for input(s): CKTOTAL, CKMB, CKMBINDEX, TROPONINI in the last 168 hours.  BNP: Invalid input(s): POCBNP  CBG: No results for input(s): GLUCAP in the last 168 hours.  Microbiology: Results for orders placed or performed during the hospital encounter of 10/29/14  Urine culture     Status: None   Collection Time: 10/29/14  1:01 PM  Result Value Ref Range Status   Specimen Description URINE, RANDOM  Final   Special Requests NONE  Final   Culture 7,000 COLONIES/mL INSIGNIFICANT GROWTH  Final   Report Status 10/31/2014 FINAL  Final    Coagulation Studies:  Recent Labs  02/28/15 0640 03/02/15 0451  LABPROT 17.9* 16.4*  INR 1.47 1.31    Urinalysis: No results for input(s): COLORURINE, LABSPEC, PHURINE, GLUCOSEU, HGBUR, BILIRUBINUR, KETONESUR, PROTEINUR, UROBILINOGEN, NITRITE, LEUKOCYTESUR in the last 72 hours.  Invalid input(s): APPERANCEUR    Imaging: No results found.   Medications:   . heparin 1,050 Units/hr (03/02/15 1340)   .  amLODipine  10 mg Oral Q1200  . calcitRIOL  0.25 mcg Oral QHS  . carvedilol  6.25 mg Oral BID WC  . citalopram  10 mg Oral QHS  . donepezil  10 mg Oral QHS  . losartan  50 mg Oral QHS  . montelukast  10 mg Oral QHS  . mycophenolate  500 mg Oral BID  . pantoprazole  40 mg Oral BID AC  . predniSONE  5 mg Oral Daily  . tacrolimus  0.5 mg Oral QHS  . warfarin  2 mg Oral q1800  . Warfarin - Pharmacist Dosing Inpatient   Does not apply q1800   ondansetron  Assessment/ Plan:  65 y.o. 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   1. Chronic kidney disease stage III T Status post renal transplantation in 2001. His baseline creatinine is 2.2 from December 2016.  -  continue mycophenolate, tacrolimus, and prednisone. - Monitor renal function, renally dose all medications.  - Losartan for renal protection  2. 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 Pt received blood transfusion this admission.  Ulcer in pylorus. Hemoglobin 7.8 Now bridging warfarin with heparin gtt.   3. Hypertension.  Blood pressure at goal - continue coreg, losartan, and amlodipine.  4.  Secondary hyperparathyroidism. No PTH available.  - Continue calcitriol 0.25 g by mouth daily.   LOS: [place], [place] 1/23/20173:17 PM

## 2015-03-02 NOTE — Progress Notes (Signed)
[place] [facility] [place] NAME: Eric Gill    MR#:  [id number]  DATE OF BIRTH:  02-12-1950  SUBJECTIVE:  Tolerating diet. No new c/o, Hb 7.8 this am, no further bleeding. INR 1.3 (back on coumadin and also on heparin) REVIEW OF SYSTEMS:   Review of Systems  Constitutional: Negative for fever, chills and weight loss.  HENT: Negative for ear discharge, ear pain and nosebleeds.   Eyes: Negative for blurred vision, pain and discharge.  Respiratory: Negative for sputum production, shortness of breath, wheezing and stridor.   Cardiovascular: Negative for chest pain, palpitations, orthopnea and PND.  Gastrointestinal: Negative for nausea, vomiting, abdominal pain and diarrhea.  Genitourinary: Negative for urgency and frequency.  Musculoskeletal: Negative for back pain and joint pain.  Neurological: Negative for sensory change, speech change, focal weakness and weakness.  Psychiatric/Behavioral: Negative for depression and hallucinations. The patient is not nervous/anxious.   All other systems reviewed and are negative.  Tolerating Diet:yes Tolerating PT: not needed  DRUG ALLERGIES:   Allergies  Allergen Reactions  . Statins Nausea And Vomiting and Other (See Comments)    Reaction:  Muscle pain     VITALS:  Blood pressure 123/63, pulse 76, temperature 98 F (36.7 C), temperature source Oral, resp. rate 18, height 5\' 7"  (1.702 m), weight 64.774 kg (142 lb 12.8 oz), SpO2 98 %. PHYSICAL EXAMINATION:   Physical Exam  GENERAL:  65 y.o.-year-old patient lying in the bed with no acute distress.  EYES: Pupils equal, round, reactive to light and accommodation. No scleral icterus. Extraocular muscles intact.  HEENT: Head atraumatic, normocephalic. Oropharynx and nasopharynx clear.  NECK:  Supple, no jugular venous distention. No thyroid enlargement, no tenderness.  LUNGS: Normal breath sounds bilaterally, no wheezing, rales, rhonchi. No use  of accessory muscles of respiration.  CARDIOVASCULAR: S1, S2 normal. No murmurs, rubs, or gallops.  ABDOMEN: Soft, nontender, nondistended. Bowel sounds present. No organomegaly or mass.  EXTREMITIES: No cyanosis, clubbing or edema b/l.    NEUROLOGIC: Cranial nerves II through XII are intact. No focal Motor or sensory deficits b/l.   PSYCHIATRIC: The patient is alert and oriented x 3.  SKIN: No obvious rash, lesion, or ulcer.  LABORATORY PANEL:  CBC  Recent Labs Lab 03/02/15 0451  WBC 7.3  HGB 7.8*  HCT 23.9*  PLT 157    Chemistries   Recent Labs Lab 02/23/15 1612  02/27/15 0651  NA 143  < > 136  K 4.4  < > 3.2*  CL 110  < > 105  CO2 25  < > 25  GLUCOSE 104*  < > 92  BUN 65*  < > 20  CREATININE 2.19*  < > 1.94*  CALCIUM 9.5  < > 8.5*  MG  --   --  1.8  AST 19  --   --   ALT 13*  --   --   ALKPHOS 64  --   --   BILITOT 0.6  --   --   < > = values in this interval not displayed.   ASSESSMENT AND PLAN:  Eric Gill is a 65 y.o. male with a known history of status post kidney transplant, hyperparathyroidism, DVT, TIA, antiphospholipid syndrome, chronic Coumadin use, vascular dementia, hyperlipidemia came in with coffee ground vomitus  * Acute blood loss anemia secondary to most likely upper GI bleed Hemoglobin is 7.3--67.0--6.2--1 unit BT--7.0-7.3-7.3--7.6--7.8 -Continue  po Protonix twice a day -resumed Coumadin at 2mg  on  1/22 and resumed IV heparin gtt also on 1/22 - INR 1.3 pharmacy managing it -EGD by Dr Candace Cruise showed  antritis, polyp in the fundus, which may be either inflammatory or adenomatous and pyloric channel ulcer-clean based  * Status post kidney transplant His renal function is stable and he is on multiple transplant medications. -appreciate nephrology input -creat stable at 1.9 - no labs today, will order for tomorrow am  * Hypertension Continue amlodipine, coreg, cozaar for now and follow his blood pressure. Stable.  * Antiphospholipid  syndrome -He also had history of blood clots in the past and TIAs and he was advised to take Coumadin. -Dr. Dorise Bullion spoke to patient's hematologist at [facility] they recommended bridging therapy while awaiting endoscopy, his INR is currently sub therapeutic at 1.3. continue heparin drip and monitor for any further signs of bleeding.  * TIA and vascular dementia Continue home medications.  Management plans discussed with the patient, family and they are in agreement.  Consider change to coumadin and lovenox (for bridging ) if possible for pt to go home - d/w his wife who is in agreement with same. She is making appt with his hematologist (Dr Danton Sewer) at [facility] for this Thursday. Their nurse [name] usually handles coumadin mgmt.   Case discussed with Care Management/Social Worker.  CODE STATUS: full  DVT Prophylaxis: scd, heparin gtt  TOTAL TIME TAKING CARE OF THIS PATIENT:25 minutes.   >50% time spent on counselling and coordination of care pt and wife    Note: This dictation was prepared with Dragon dictation along with smaller phrase technology. Any transcriptional errors that result from this process are unintentional.  [facility], Eric Gill M.D on 03/02/2015 at 7:05 AM  Between 7am to 6pm - Pager - [phone]  After 6pm go to [email or web address] - password [email or web address]  Office  [phone]  CC: Primary care physician; Idelle Crouch, MD

## 2015-03-02 NOTE — Care Management Important Message (Signed)
Important Message  Patient Details  Name: Eric Gill MRN: [id number] Date of Birth: 1950/04/02   Medicare Important Message Given:  Yes    Juliann Pulse A Isaly Fasching 03/02/2015, 1:51 PM

## 2015-03-03 LAB — BASIC METABOLIC PANEL
Anion gap: 6 (ref 5–15)
BUN: 40 mg/dL — AB (ref 6–20)
CO2: 22 mmol/L (ref 22–32)
Calcium: 8.7 mg/dL — ABNORMAL LOW (ref 8.9–10.3)
Chloride: 110 mmol/L (ref 101–111)
Creatinine, Ser: 3.19 mg/dL — ABNORMAL HIGH (ref 0.61–1.24)
GFR calc Af Amer: 22 mL/min — ABNORMAL LOW (ref >60)
GFR, EST NON AFRICAN AMERICAN: 19 mL/min — AB (ref >60)
GLUCOSE: 92 mg/dL (ref 65–99)
POTASSIUM: 3.8 mmol/L (ref 3.5–5.1)
Sodium: 138 mmol/L (ref 135–145)

## 2015-03-03 LAB — CBC
HCT: 22.8 % — ABNORMAL LOW (ref 40.0–52.0)
Hemoglobin: 7.4 g/dL — ABNORMAL LOW (ref 13.0–18.0)
MCH: 29 pg (ref 26.0–34.0)
MCHC: 32.6 g/dL (ref 32.0–36.0)
MCV: 89 fL (ref 80.0–100.0)
PLATELETS: 160 10*3/uL (ref 150–440)
RBC: 2.57 MIL/uL — AB (ref 4.40–5.90)
RDW: 14.3 % (ref 11.5–14.5)
WBC: 7.1 10*3/uL (ref 3.8–10.6)

## 2015-03-03 LAB — PROTIME-INR
INR: 1.24
Prothrombin Time: 15.8 seconds — ABNORMAL HIGH (ref 11.4–15.0)

## 2015-03-03 LAB — SURGICAL PATHOLOGY

## 2015-03-03 LAB — HEPARIN LEVEL (UNFRACTIONATED): Heparin Unfractionated: 0.48 IU/mL (ref 0.30–0.70)

## 2015-03-03 MED ORDER — SODIUM CHLORIDE 0.9 % IV SOLN
INTRAVENOUS | Status: AC
  Administered 2015-03-03 – 2015-03-04 (×2): via INTRAVENOUS

## 2015-03-03 MED ORDER — WARFARIN SODIUM 2 MG PO TABS
3.0000 mg | ORAL_TABLET | Freq: Every day | ORAL | Status: DC
  Administered 2015-03-03 – 2015-03-04 (×2): 3 mg via ORAL
  Filled 2015-03-03 (×2): qty 1

## 2015-03-03 MED ORDER — SENNOSIDES-DOCUSATE SODIUM 8.6-50 MG PO TABS
1.0000 | ORAL_TABLET | Freq: Two times a day (BID) | ORAL | Status: DC
  Administered 2015-03-03 – 2015-03-04 (×3): 1 via ORAL
  Filled 2015-03-03 (×3): qty 1

## 2015-03-03 NOTE — Progress Notes (Signed)
[place] at [place] NAME: Eric Gill    MR#:  [id number]  DATE OF BIRTH:  11-27-1950  SUBJECTIVE:  Tolerating diet. No new c/o, Hb 7.4 (7.8) this am, no further bleeding. INR 1.2 (on coumadin and also on heparin), wanting to go home but creat 3.19 (1.94 on 1/20), wife at bedside and discussed my concerns with both REVIEW OF SYSTEMS:   Review of Systems  Constitutional: Negative for fever, chills and weight loss.  HENT: Negative for ear discharge, ear pain and nosebleeds.   Eyes: Negative for blurred vision, pain and discharge.  Respiratory: Negative for sputum production, shortness of breath, wheezing and stridor.   Cardiovascular: Negative for chest pain, palpitations, orthopnea and PND.  Gastrointestinal: Negative for nausea, vomiting, abdominal pain and diarrhea.  Genitourinary: Negative for urgency and frequency.  Musculoskeletal: Negative for back pain and joint pain.  Neurological: Negative for sensory change, speech change, focal weakness and weakness.  Psychiatric/Behavioral: Negative for depression and hallucinations. The patient is not nervous/anxious.   All other systems reviewed and are negative.  Tolerating Diet:yes Tolerating PT: not needed  DRUG ALLERGIES:   Allergies  Allergen Reactions  . Statins Nausea And Vomiting and Other (See Comments)    Reaction:  Muscle pain    VITALS:  Blood pressure 138/66, pulse 64, temperature 98.9 F (37.2 C), temperature source Oral, resp. rate 18, height 5\' 7"  (1.702 m), weight 64.774 kg (142 lb 12.8 oz), SpO2 100 %. PHYSICAL EXAMINATION:   Physical Exam  GENERAL:  65 y.o.-year-old patient lying in the bed with no acute distress.  EYES: Pupils equal, round, reactive to light and accommodation. No scleral icterus. Extraocular muscles intact.  HEENT: Head atraumatic, normocephalic. Oropharynx and nasopharynx clear.  NECK:  Supple, no jugular venous distention. No thyroid  enlargement, no tenderness.  LUNGS: Normal breath sounds bilaterally, no wheezing, rales, rhonchi. No use of accessory muscles of respiration.  CARDIOVASCULAR: S1, S2 normal. No murmurs, rubs, or gallops.  ABDOMEN: Soft, nontender, nondistended. Bowel sounds present. No organomegaly or mass.  EXTREMITIES: No cyanosis, clubbing or edema b/l.    NEUROLOGIC: Cranial nerves II through XII are intact. No focal Motor or sensory deficits b/l.   PSYCHIATRIC: The patient is alert and oriented x 3.  SKIN: No obvious rash, lesion, or ulcer.  LABORATORY PANEL:  CBC  Recent Labs Lab 03/03/15 0552  WBC 7.1  HGB 7.4*  HCT 22.8*  PLT 160    Chemistries   Recent Labs Lab 02/27/15 0651 03/03/15 0552  NA 136 138  K 3.2* 3.8  CL 105 110  CO2 25 22  GLUCOSE 92 92  BUN 20 40*  CREATININE 1.94* 3.19*  CALCIUM 8.5* 8.7*  MG 1.8  --      ASSESSMENT AND PLAN:  Eric Gill is a 65 y.o. male with a known history of status post kidney transplant, hyperparathyroidism, DVT, TIA, antiphospholipid syndrome, chronic Coumadin use, vascular dementia, hyperlipidemia came in with coffee ground vomitus  * Acute blood loss anemia secondary to most likely upper GI bleed Hemoglobin is 7.3--6.7--6.2--1 unit BT--7.0-7.3-7.3--7.6--7.8--7.4 -Continue  po Protonix twice a day -resumed Coumadin at 2mg  on 1/22 and resumed IV heparin gtt also on 1/22 - INR 1.2 pharmacy managing it -EGD by Dr Candace Cruise showed  antritis, polyp in the fundus, which may be either inflammatory or adenomatous and pyloric channel ulcer-clean based - with his Hb dropped to 7.4 and not yet fully anticoagulated - I don't feel  comfortable sending him home on lovenox and coumadin.  * Status post kidney transplant His renal function is stable and he is on multiple transplant medications. -appreciate nephrology input -creat slowly worsening 3.19 (1.9 on 1/20) - will d/w Nephro as they've been following  * Hypertension Continue amlodipine,  coreg, cozaar for now and follow his blood pressure. Stable.  * Antiphospholipid syndrome -He also had history of blood clots in the past and TIAs and he was advised to take Coumadin. -Dr. Dorise Bullion spoke to patient's hematologist at [facility] they recommended bridging therapy while awaiting endoscopy, his INR is currently sub therapeutic at 1.2. continue heparin drip and monitor for any further signs of bleeding.  * TIA and vascular dementia Continue home medications.  Management plans discussed with the patient, family and they are in agreement.  Consider change to coumadin and lovenox (for bridging ) if possible for pt to go home - d/w his wife who is in agreement with same. She is making appt with his hematologist (Dr Danton Sewer) at [facility] for this Thursday. Their nurse [name] usually handles coumadin mgmt.  With his Hemoglobin dropped today and INR still at 1.2, I don't think it's a good idea to go home especially with worsening kidney function.   Case discussed with Care Management/Social Worker.  CODE STATUS: full  DVT Prophylaxis: scd, heparin gtt  TOTAL TIME TAKING CARE OF THIS PATIENT:25 minutes.   >50% time spent on counselling and coordination of care pt and wife    Note: This dictation was prepared with Dragon dictation along with smaller phrase technology. Any transcriptional errors that result from this process are unintentional.  [facility], Rollen Selders M.D on 03/03/2015 at 10:01 AM  Between 7am to 6pm - Pager - [phone]  After 6pm go to [email or web address] - password [email or web address] [place] [facility]  Office  [phone]  CC: Primary care physician; Idelle Crouch, MD

## 2015-03-03 NOTE — Progress Notes (Signed)
[facility]  ROUNDING NOTE   Subjective:   Creatinine elevated at 3.19. Patient states he has a poor appetite  Objective:  Vital signs in last 24 hours:  Temp:  [98 F (36.7 C)-99.4 F (37.4 C)] 99.4 F (37.4 C) (01/24 1358) Pulse Rate:  [63-68] 63 (01/24 1358) Resp:  [18-20] 18 (01/24 1358) BP: (123-138)/(56-67) 123/57 mmHg (01/24 1358) SpO2:  [95 %-100 %] 99 % (01/24 1358)  Weight change:  Filed Weights   02/23/15 1558 02/23/15 1953  Weight: 65.772 kg (145 lb) 64.774 kg (142 lb 12.8 oz)    Intake/Output: I/O last 3 completed shifts: In: 406.7 [I.V.:406.7] Out: -    Intake/Output this shift:  Total I/O In: 120 [P.O.:120] Out: -   Physical Exam: General: NAD, laying in bed  Head: Normocephalic, atraumatic. Moist oral mucosal membranes  Eyes: Anicteric  Neck: Supple, trachea midline  Lungs:  Clear to auscultation, normal effort   Heart: Regular rate and rhythm  Abdomen:  Soft, nontender, BS present, right lower quadrant allograft kidney  Extremities: no peripheral edema.  Neurologic: Nonfocal, moving all four extremities  Skin: No lesions       Basic Metabolic Panel:  Recent Labs Lab 02/25/15 0856 02/26/15 0504 02/27/15 0651 03/03/15 0552  NA 140 139 136 138  K 3.4* 3.2* 3.2* 3.8  CL 109 110 105 110  CO2 25 26 25 22   GLUCOSE 90 90 92 92  BUN 35* 24* 20 40*  CREATININE 2.07* 1.92* 1.94* 3.19*  CALCIUM 8.7* 8.6* 8.5* 8.7*  MG  --   --  1.8  --   PHOS  --   --  2.2*  --     Liver Function Tests:  Recent Labs Lab 02/27/15 0651  ALBUMIN 3.1*   No results for input(s): LIPASE, AMYLASE in the last 168 hours. No results for input(s): AMMONIA in the last 168 hours.  CBC:  Recent Labs Lab 02/26/15 0504 02/27/15 0651 02/28/15 0531 03/02/15 0451 03/03/15 0552  WBC 5.9 6.3 7.0 7.3 7.1  HGB 7.3* 7.3* 7.6* 7.8* 7.4*  HCT 22.2* 22.7* 23.2* 23.9* 22.8*  MCV 89.6 87.3 88.9 88.5 89.0  PLT 119* 138* 149* 157 160    Cardiac  Enzymes: No results for input(s): CKTOTAL, CKMB, CKMBINDEX, TROPONINI in the last 168 hours.  BNP: Invalid input(s): POCBNP  CBG: No results for input(s): GLUCAP in the last 168 hours.  Microbiology: Results for orders placed or performed during the hospital encounter of 10/29/14  Urine culture     Status: None   Collection Time: 10/29/14  1:01 PM  Result Value Ref Range Status   Specimen Description URINE, RANDOM  Final   Special Requests NONE  Final   Culture 7,000 COLONIES/mL INSIGNIFICANT GROWTH  Final   Report Status 10/31/2014 FINAL  Final    Coagulation Studies:  Recent Labs  03/02/15 0451 03/03/15 0552  LABPROT 16.4* 15.8*  INR 1.31 1.24    Urinalysis: No results for input(s): COLORURINE, LABSPEC, PHURINE, GLUCOSEU, HGBUR, BILIRUBINUR, KETONESUR, PROTEINUR, UROBILINOGEN, NITRITE, LEUKOCYTESUR in the last 72 hours.  Invalid input(s): APPERANCEUR    Imaging: No results found.   Medications:   . heparin 1,050 Units/hr (03/03/15 1242)   . amLODipine  10 mg Oral Q1200  . calcitRIOL  0.25 mcg Oral QHS  . carvedilol  6.25 mg Oral BID WC  . citalopram  10 mg Oral QHS  . donepezil  10 mg Oral QHS  . losartan  50 mg Oral QHS  .  montelukast  10 mg Oral QHS  . mycophenolate  500 mg Oral BID  . pantoprazole  40 mg Oral BID AC  . predniSONE  5 mg Oral Daily  . tacrolimus  0.5 mg Oral QHS  . warfarin  3 mg Oral q1800  . Warfarin - Pharmacist Dosing Inpatient   Does not apply q1800   ondansetron  Assessment/ Plan:  65 y.o. 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   1. Acute renal failure on  Chronic kidney disease stage III T Status post renal transplantation in 2001. His baseline creatinine is 2.2 from December 2016.  - most likely ATN from hypotension and anemia.  - Start NS at 26mL/hr -  continue mycophenolate, tacrolimus, and prednisone. - Monitor renal function, renally dose all medications.  - Losartan for renal protection  2. 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 Pt received blood transfusion this admission.  Ulcer in pylorus. Hemoglobin 7.4 Now bridging warfarin with heparin gtt.   3. Hypertension.  Blood pressure at goal - continue coreg, losartan, and amlodipine.  4.  Secondary hyperparathyroidism. No PTH available.  - Continue calcitriol 0.25 g by mouth daily.   LOS: Eric Gill, Eric Gill 1/24/20172:06 PM

## 2015-03-03 NOTE — Plan of Care (Signed)
Problem: Physical Regulation: Goal: Complications related to the disease process, condition or treatment will be avoided or minimized Outcome: Progressing Heparin gtt infusing at 10.50ml/min. Restarted Coumadin. EGD performed yesterday. Pt up with standby assist to bathroom. Denies pain. No signs of active bleeding. Possible discharge to home today.

## 2015-03-03 NOTE — Progress Notes (Signed)
Nurse expressed concerns with Dr. Manuella Ghazi about elevated BUN and Creatinine which is elevated from baseline.  Dr. Manuella Ghazi acknowledged information and no new orders given at this time. Nurse will continue to monitor closely.

## 2015-03-03 NOTE — Progress Notes (Signed)
ANTICOAGULATION CONSULT NOTE - Follow Up Consult  Pharmacy Consult for Coumadin Indication: Antiphospholipid symdrome, DVT  Allergies  Allergen Reactions  . Statins Nausea And Vomiting and Other (See Comments)    Reaction:  Muscle pain     Patient Measurements: Height: 5\' 7"  (170.2 cm) Weight: 142 lb 12.8 oz (64.774 kg) IBW/kg (Calculated) : 66.1 Heparin Dosing Weight: 64.8 kg  Vital Signs: Temp: 98.9 F (37.2 C) (01/24 0742) Temp Source: Oral (01/24 0742) BP: 138/66 mmHg (01/24 0742) Pulse Rate: 64 (01/24 0742)  Labs:  Recent Labs  03/02/15 0451 03/02/15 1028 03/02/15 1722 03/03/15 0552  HGB 7.8*  --   --  7.4*  HCT 23.9*  --   --  22.8*  PLT 157  --   --  160  LABPROT 16.4*  --   --  15.8*  INR 1.31  --   --  1.24  HEPARINUNFRC  --  0.51 0.60 0.48  CREATININE  --   --   --  3.19*    Estimated Creatinine Clearance: 20.9 mL/min (by C-G formula based on Cr of 3.19).   Medications:  Scheduled:  . amLODipine  10 mg Oral Q1200  . calcitRIOL  0.25 mcg Oral QHS  . carvedilol  6.25 mg Oral BID WC  . citalopram  10 mg Oral QHS  . donepezil  10 mg Oral QHS  . losartan  50 mg Oral QHS  . montelukast  10 mg Oral QHS  . mycophenolate  500 mg Oral BID  . pantoprazole  40 mg Oral BID AC  . predniSONE  5 mg Oral Daily  . tacrolimus  0.5 mg Oral QHS  . warfarin  3 mg Oral q1800  . Warfarin - Pharmacist Dosing Inpatient   Does not apply q1800   Infusions:  . heparin 1,050 Units/hr (03/02/15 1340)    Assessment: Patient restarted on Coumadin with Heparin bridge on 1/22.   1/22 INR = 1.47 1/23 INR=1.31 1/24 INR = 1.24  Goal of Therapy:  INR 2-3 Monitor platelets by anticoagulation protocol: Yes   Plan:  INR trending down.  Will increase dose to 3 mg po daily.  Will recheck INR in AM.   Murrell Converse, PharmD Clinical Pharmacist 03/03/2015

## 2015-03-03 NOTE — Progress Notes (Signed)
ANTICOAGULATION CONSULT NOTE - Follow Up Consult  Pharmacy Consult for Heparin Indication: DVT, antiphospholipid syndrome  Allergies  Allergen Reactions  . Statins Nausea And Vomiting and Other (See Comments)    Reaction:  Muscle pain     Patient Measurements: Height: 5\' 7"  (170.2 cm) Weight: 142 lb 12.8 oz (64.774 kg) IBW/kg (Calculated) : 66.1 Heparin Dosing Weight: 64.8 kg  Vital Signs: Temp: 98.9 F (37.2 C) (01/24 0742) Temp Source: Oral (01/24 0742) BP: 138/66 mmHg (01/24 0742) Pulse Rate: 64 (01/24 0742)  Labs:  Recent Labs  03/02/15 0451 03/02/15 1028 03/02/15 1722 03/03/15 0552  HGB 7.8*  --   --  7.4*  HCT 23.9*  --   --  22.8*  PLT 157  --   --  160  LABPROT 16.4*  --   --  15.8*  INR 1.31  --   --  1.24  HEPARINUNFRC  --  0.51 0.60 0.48  CREATININE  --   --   --  3.19*    Estimated Creatinine Clearance: 20.9 mL/min (by C-G formula based on Cr of 3.19).   Medications:  Scheduled:  . amLODipine  10 mg Oral Q1200  . calcitRIOL  0.25 mcg Oral QHS  . carvedilol  6.25 mg Oral BID WC  . citalopram  10 mg Oral QHS  . donepezil  10 mg Oral QHS  . losartan  50 mg Oral QHS  . montelukast  10 mg Oral QHS  . mycophenolate  500 mg Oral BID  . pantoprazole  40 mg Oral BID AC  . predniSONE  5 mg Oral Daily  . tacrolimus  0.5 mg Oral QHS  . warfarin  2 mg Oral q1800  . Warfarin - Pharmacist Dosing Inpatient   Does not apply q1800   Infusions:  . heparin 1,050 Units/hr (03/02/15 1340)    Assessment: Patient restarted on Coumadin with Heparin bridge on 1/22.  1/23 HL=0.51 1/23 HL @ 1722 = 0.6 1/24 HL =0.48  Goal of Therapy:  Heparin level 0.3-0.7 units/ml Monitor platelets by anticoagulation protocol: Yes   Plan:  Will continue with current rate of 1050 units/hr and check next level with am labs.  Murrell Converse, PharmD Clinical Pharmacist 03/03/2015

## 2015-03-04 LAB — BASIC METABOLIC PANEL
ANION GAP: 6 (ref 5–15)
BUN: 35 mg/dL — ABNORMAL HIGH (ref 6–20)
CALCIUM: 8.5 mg/dL — AB (ref 8.9–10.3)
CO2: 21 mmol/L — ABNORMAL LOW (ref 22–32)
CREATININE: 3.17 mg/dL — AB (ref 0.61–1.24)
Chloride: 110 mmol/L (ref 101–111)
GFR calc non Af Amer: 19 mL/min — ABNORMAL LOW (ref >60)
GFR, EST AFRICAN AMERICAN: 22 mL/min — AB (ref >60)
Glucose, Bld: 87 mg/dL (ref 65–99)
Potassium: 4.1 mmol/L (ref 3.5–5.1)
SODIUM: 137 mmol/L (ref 135–145)

## 2015-03-04 LAB — CBC
HCT: 21.3 % — ABNORMAL LOW (ref 40.0–52.0)
HEMOGLOBIN: 6.8 g/dL — AB (ref 13.0–18.0)
MCH: 28.7 pg (ref 26.0–34.0)
MCHC: 32.1 g/dL (ref 32.0–36.0)
MCV: 89.4 fL (ref 80.0–100.0)
Platelets: 161 10*3/uL (ref 150–440)
RBC: 2.39 MIL/uL — ABNORMAL LOW (ref 4.40–5.90)
RDW: 14.1 % (ref 11.5–14.5)
WBC: 6.5 10*3/uL (ref 3.8–10.6)

## 2015-03-04 LAB — TYPE AND SCREEN
ABO/RH(D): A NEG
Antibody Screen: NEGATIVE
DAT, IgG: NEGATIVE
UNIT DIVISION: 0
Weak D: POSITIVE

## 2015-03-04 LAB — PROTIME-INR
INR: 1.32
PROTHROMBIN TIME: 16.5 s — AB (ref 11.4–15.0)

## 2015-03-04 LAB — PREPARE RBC (CROSSMATCH)

## 2015-03-04 LAB — HEPARIN LEVEL (UNFRACTIONATED): Heparin Unfractionated: 0.5 IU/mL (ref 0.30–0.70)

## 2015-03-04 MED ORDER — LOPERAMIDE HCL 2 MG PO CAPS: 2.0000 mg | ORAL_CAPSULE | ORAL | Status: DC | PRN

## 2015-03-04 MED ORDER — SODIUM CHLORIDE 0.9 % IV SOLN
INTRAVENOUS | Status: DC
  Administered 2015-03-04 – 2015-03-06 (×4): via INTRAVENOUS

## 2015-03-04 MED ORDER — ACETAMINOPHEN 325 MG PO TABS: 650.0000 mg | ORAL_TABLET | Freq: Once | ORAL | Status: DC

## 2015-03-04 MED ORDER — SODIUM CHLORIDE 0.9 % IV SOLN: Freq: Once | INTRAVENOUS | Status: DC

## 2015-03-04 NOTE — Progress Notes (Signed)
[facility] [place] NAME: Eric Gill    MR#:  [id number]  DATE OF BIRTH:  Sep 14, 1950  SUBJECTIVE: Hemoglobin trended to 6.8 but no evidence of melena or hematemesis. Could be dilutional. On heparin drip. History of poor by mouth intake. Cr still 3.   REVIEW OF SYSTEMS:   Review of Systems  Constitutional: Negative for fever, chills and weight loss.  HENT: Negative for ear discharge, ear pain and nosebleeds.   Eyes: Negative for blurred vision, pain and discharge.  Respiratory: Negative for sputum production, shortness of breath, wheezing and stridor.   Cardiovascular: Negative for chest pain, palpitations, orthopnea and PND.  Gastrointestinal: Negative for nausea, vomiting, abdominal pain and diarrhea.  Genitourinary: Negative for urgency and frequency.  Musculoskeletal: Negative for back pain and joint pain.  Neurological: Negative for sensory change, speech change, focal weakness and weakness.  Psychiatric/Behavioral: Negative for depression and hallucinations. The patient is not nervous/anxious.   All other systems reviewed and are negative.  Tolerating Diet:yes Tolerating PT: not needed  DRUG ALLERGIES:   Allergies  Allergen Reactions  . Statins Nausea And Vomiting and Other (See Comments)    Reaction:  Muscle pain    VITALS:  Blood pressure 122/55, pulse 65, temperature 99.6 F (37.6 C), temperature source Oral, resp. rate 20, height 5\' 7"  (1.702 m), weight 64.774 kg (142 lb 12.8 oz), SpO2 99 %. PHYSICAL EXAMINATION:   Physical Exam  GENERAL:  65 y.o.-year-old patient lying in the bed with no acute distress.  EYES: Pupils equal, round, reactive to light and accommodation. No scleral icterus. Extraocular muscles intact.  HEENT: Head atraumatic, normocephalic. Oropharynx and nasopharynx clear.  NECK:  Supple, no jugular venous distention. No thyroid enlargement, no tenderness.  LUNGS: Normal breath sounds  bilaterally, no wheezing, rales, rhonchi. No use of accessory muscles of respiration.  CARDIOVASCULAR: S1, S2 normal. No murmurs, rubs, or gallops.  ABDOMEN: Soft, nontender, nondistended. Bowel sounds present. No organomegaly or mass.  EXTREMITIES: No cyanosis, clubbing or edema b/l.    NEUROLOGIC: Cranial nerves II through XII are intact. No focal Motor or sensory deficits b/l.   PSYCHIATRIC: The patient is alert and oriented x 3.  SKIN: No obvious rash, lesion, or ulcer.  LABORATORY PANEL:  CBC  Recent Labs Lab 03/04/15 0609  WBC 6.5  HGB 6.8*  HCT 21.3*  PLT 161    Chemistries   Recent Labs Lab 02/27/15 0651  03/04/15 0609  NA 136  < > 137  K 3.2*  < > 4.1  CL 105  < > 110  CO2 25  < > 21*  GLUCOSE 92  < > 87  BUN 20  < > 35*  CREATININE 1.94*  < > 3.17*  CALCIUM 8.5*  < > 8.5*  MG 1.8  --   --   < > = values in this interval not displayed.   ASSESSMENT AND PLAN:  Eric Gill is a 65 y.o. male with a known history of status post kidney transplant, hyperparathyroidism, DVT, TIA, antiphospholipid syndrome, chronic Coumadin use, vascular dementia, hyperlipidemia came in with coffee ground vomitus  * Acute blood loss anemia secondary to most likely upper GI bleed Hemoglobin is 7.3--6.7--6.2--1 unit BT--7.0-7.3-7.3--7.6--7.8--7.4-6.8 EGD showed gastric ulcer. May need a colonoscopy if patient continues to have low hemoglobin and anemia. -Continue  po Protonix twice a day   *  Status post kidney transplant; transfusions should  be high because of forearm kidney transplant, development  of antibodies.  His renal function is stable and he is on multiple transplant medications. -appreciate nephrology input  Continue amlodipine, coreg, cozaar for now and follow his blood pressure. Stable.  * Antiphospholipid syndrome;  On  heparin drip and Coumadin. -He also had history of blood clots in the past and TIAs and he was advised to take Coumadin. -Dr. Dorise Bullion spoke  to patient's hematologist at [facility]   * TIA and vascular dementia Continue home medications.  Management plans discussed with the patient, family and they are in agreement.  8 acute on chronic renal failure CKD stage 3; history of kidney transplant in 2001. Baseline creatinine 2.2. Continue IV hydration.  Case discussed with Care Management/Social Worker.  CODE STATUS: full  DVT Prophylaxis: scd, heparin gtt  TOTAL TIME TAKING CARE OF THIS PATIENT:25 minutes.   >50% time spent on counselling and coordination of care pt and wife    Note: This dictation was prepared with Dragon dictation along with smaller phrase technology. Any transcriptional errors that result from this process are unintentional.  Zeah Germano M.D on 03/04/2015 at 1:07 PM  Between 7am to 6pm - Pager - [phone]  After 6pm go to [email or web address] - password [email or web address] [facility]  Office  [phone]  CC: Primary care physician; Idelle Crouch, MD

## 2015-03-04 NOTE — Progress Notes (Signed)
Initial Nutrition Assessment   INTERVENTION:   Meals and Snacks: Cater to patient preferences; will add snacks between meals such as cottage cheese ans peaches as am snack and cup of egg or tuna salad as bedtime snack. Will send homemade milkshake as afternoon snack in lieu of Ensure supplement shake as pt does not like. Coordination of Care: will recommend collecting new weights   NUTRITION DIAGNOSIS:   Inadequate oral intake related to poor appetite as evidenced by per patient/family report.  GOAL:   Patient will meet greater than or equal to 90% of their needs  MONITOR:    (Energy Intake, Electrolyte and renal Profile, Anthropometrics, Digestive system, Anemia Profile)  REASON FOR ASSESSMENT:   Consult Poor PO  ASSESSMENT:   Pt admitted with GI bleed s/p EGD presenting with polyp in fundus per MD note.   Past Medical History  Diagnosis Date  . Status post kidney transplant 2001  . Hyperparathyroidism ([place])   . DVT (deep venous thrombosis) ([place])   . TIA (transient ischemic attack)   . Essential hypertension   . History of CMV   . Pedal edema   . Rosacea   . Antiphospholipid antibody syndrome ([place])   . Hyperlipidemia   . Vascular dementia without behavioral disturbance      Diet Order:  Diet regular Room service appropriate?: Yes; Fluid consistency:: Thin    Current Nutrition: Pt eating a cheese omelette this am on visit with coffee. Wife did report currently pt does not have his bottom dentures with him and has been sticking to softer foods.  Food/Nutrition-Related History: Pt reports poor appetite since admission but was eating well PTA. Pt's wife reports the same. Per wife pt has had teeth extraction  September 2015 and then again in 2016, where both times he had poor po intake and was drinking milkshakes and some Ensures (although he does not like). RD notes during this admission pt was eating 75-100% on 1/21 but has since declined. Pt's wife reports bringing  in some potato soup last night that pt ate some of.   Scheduled Medications:  . amLODipine  10 mg Oral Q1200  . calcitRIOL  0.25 mcg Oral QHS  . carvedilol  6.25 mg Oral BID WC  . citalopram  10 mg Oral QHS  . donepezil  10 mg Oral QHS  . losartan  50 mg Oral QHS  . montelukast  10 mg Oral QHS  . mycophenolate  500 mg Oral BID  . pantoprazole  40 mg Oral BID AC  . predniSONE  5 mg Oral Daily  . senna-docusate  1 tablet Oral BID  . tacrolimus  0.5 mg Oral QHS  . warfarin  3 mg Oral q1800  . Warfarin - Pharmacist Dosing Inpatient   Does not apply q1800    Continuous Medications:  . sodium chloride 75 mL/hr at 03/04/15 0417  . heparin 1,050 Units/hr (03/04/15 0825)     Electrolyte/Renal Profile and Glucose Profile:   Recent Labs Lab 02/27/15 0651 03/03/15 0552 03/04/15 0609  NA 136 138 137  K 3.2* 3.8 4.1  CL 105 110 110  CO2 25 22 21*  BUN 20 40* 35*  CREATININE 1.94* 3.19* 3.17*  CALCIUM 8.5* 8.7* 8.5*  MG 1.8  --   --   PHOS 2.2*  --   --   GLUCOSE 92 92 87   Protein Profile:  Recent Labs Lab 02/27/15 0651  ALBUMIN 3.1*    Gastrointestinal Profile: Last BM:  03/04/2015  Nutrition-Focused Physical Exam Findings:  Unable to complete Nutrition-Focused physical exam at this time.    Weight Change: Pt reports stable weight recently. Wife reports weight loss September 2015  Where pt was weighing 175lbs but recently it has been stable.   Filed Weights   02/23/15 1558 02/23/15 1953  Weight: 145 lb (65.772 kg) 142 lb 12.8 oz (64.774 kg)   Height:   Ht Readings from Last 1 Encounters:  02/23/15 5\' 7"  (1.702 m)    Weight:   Wt Readings from Last 1 Encounters:  02/23/15 142 lb 12.8 oz (64.774 kg)   Wt Readings from Last 10 Encounters:  02/23/15 142 lb 12.8 oz (64.774 kg)  10/29/14 145 lb (65.772 kg)  10/01/14 150 lb (68.04 kg)     BMI:  Body mass index is 22.36 kg/(m^2).  Estimated Nutritional Needs:   Kcal:  BEE: 1380kcals, TEE: (IF  1.1-1.3)(AF 1.2) 1822-2150kcals  Protein:  65-77g protein (1.0-1.2g/kg)  Fluid:  1618-1968mL of fluid (25-9mL/kg)  EDUCATION NEEDS:   No education needs identified at this time   Roland, RD, LDN Pager [phone] Weekend/On-Call Pager [phone]

## 2015-03-04 NOTE — Care Management Important Message (Signed)
Important Message  Patient Details  Name: Eric Gill MRN: [id number] Date of Birth: [birth year]   Medicare Important Message Given:  Yes    Juliann Pulse A Gary Bultman 03/04/2015, 11:34 AM

## 2015-03-04 NOTE — Progress Notes (Signed)
ANTICOAGULATION CONSULT NOTE - Follow Up Consult  Pharmacy Consult for Coumadin Indication: Antiphospholipid symdrome, DVT  Allergies  Allergen Reactions  . Statins Nausea And Vomiting and Other (See Comments)    Reaction:  Muscle pain     Patient Measurements: Height: 5\' 7"  (170.2 cm) Weight: 142 lb 12.8 oz (64.774 kg) IBW/kg (Calculated) : 66.1 Heparin Dosing Weight: 64.8 kg  Vital Signs: Temp: 97.9 F (36.6 C) (01/25 0454) Temp Source: Oral (01/25 0454) BP: 141/63 mmHg (01/25 0454) Pulse Rate: 66 (01/25 0454)  Labs:  Recent Labs  03/02/15 0451  03/02/15 1722 03/03/15 0552 03/04/15 0609  HGB 7.8*  --   --  7.4* 6.8*  HCT 23.9*  --   --  22.8* 21.3*  PLT 157  --   --  160 161  LABPROT 16.4*  --   --  15.8* 16.5*  INR 1.31  --   --  1.24 1.32  HEPARINUNFRC  --   < > 0.60 0.48 0.50  CREATININE  --   --   --  3.19*  --   < > = values in this interval not displayed.  Estimated Creatinine Clearance: 20.9 mL/min (by C-G formula based on Cr of 3.19).   Medications:  Scheduled:  . amLODipine  10 mg Oral Q1200  . calcitRIOL  0.25 mcg Oral QHS  . carvedilol  6.25 mg Oral BID WC  . citalopram  10 mg Oral QHS  . donepezil  10 mg Oral QHS  . losartan  50 mg Oral QHS  . montelukast  10 mg Oral QHS  . mycophenolate  500 mg Oral BID  . pantoprazole  40 mg Oral BID AC  . predniSONE  5 mg Oral Daily  . senna-docusate  1 tablet Oral BID  . tacrolimus  0.5 mg Oral QHS  . warfarin  3 mg Oral q1800  . Warfarin - Pharmacist Dosing Inpatient   Does not apply q1800   Infusions:  . sodium chloride 75 mL/hr at 03/04/15 0417  . heparin 1,050 Units/hr (03/03/15 1242)    Assessment: Patient restarted on Coumadin with Heparin bridge on 1/22.   1/22 INR = 1.47 1/23 INR=1.31 1/24 INR = 1.24 1/24 INR = 1.32  Goal of Therapy:  INR 2-3 Monitor platelets by anticoagulation protocol: Yes   Plan:  INR trending down.  Continue 3 mg daily.  Will recheck INR in AM. Will consider  increasing dose if no significant change in INR.  Sim Boast, PharmD, BCPS  03/04/2015

## 2015-03-04 NOTE — Progress Notes (Signed)
Patient complained to nurse of having diarrhea 3 times today and asked for imodium.  Dr. Vianne Bulls paged and telephone order received for medication.

## 2015-03-04 NOTE — Progress Notes (Signed)
GI Inpatient Follow-up Note  Patient Identification: Eric Gill is a 65 y.o. male with UGI bleed on coumadin.   EGD: with pyloric channel ulcer  Subjective:  No rectal bleeding or melena. No abd pain. NO f/c.  Tired of being in hosp. On Heparin drp and coumadin.  Hgb down some today.   Scheduled Inpatient Medications:  . amLODipine  10 mg Oral Q1200  . calcitRIOL  0.25 mcg Oral QHS  . carvedilol  6.25 mg Oral BID WC  . citalopram  10 mg Oral QHS  . donepezil  10 mg Oral QHS  . losartan  50 mg Oral QHS  . montelukast  10 mg Oral QHS  . mycophenolate  500 mg Oral BID  . pantoprazole  40 mg Oral BID AC  . predniSONE  5 mg Oral Daily  . senna-docusate  1 tablet Oral BID  . tacrolimus  0.5 mg Oral QHS  . warfarin  3 mg Oral q1800  . Warfarin - Pharmacist Dosing Inpatient   Does not apply q1800    Continuous Inpatient Infusions:   . sodium chloride 75 mL/hr at 03/04/15 0417  . heparin 1,050 Units/hr (03/04/15 0825)    PRN Inpatient Medications:  ondansetron  Review of Systems: Constitutional: Weight is stable.  Eyes: No changes in vision. ENT: No oral lesions, sore throat.  GI: see HPI.  Heme/Lymph: No easy bruising.  CV: No chest pain.  GU: No hematuria.  Integumentary: No rashes.  Neuro: No headaches.  Psych: No depression/anxiety.  Endocrine: No heat/cold intolerance.  Allergic/Immunologic: No urticaria.  Resp: No cough, SOB.  Musculoskeletal: No joint swelling.    Physical Examination: BP 122/55 mmHg  Pulse 65  Temp(Src) 99.6 F (37.6 C) (Oral)  Resp 20  Ht 5\' 7"  (1.702 m)  Wt 64.774 kg (142 lb 12.8 oz)  BMI 22.36 kg/m2  SpO2 99% Gen: NAD, alert and oriented x 4 Neck: supple, no JVD or thyromegaly Chest: CTA bilaterally, no wheezes, crackles, or other adventitious sounds CV: RRR, no m/g/c/r Abd: soft, NT, ND, +BS in all four quadrants; no HSM, guarding, ridigity, or rebound tenderness Ext: no edema, well perfused with 2+ pulses, Skin: no rash  or lesions noted Lymph: no LAD  Data: Lab Results  Component Value Date   WBC 6.5 03/04/2015   HGB 6.8* 03/04/2015   HCT 21.3* 03/04/2015   MCV 89.4 03/04/2015   PLT 161 03/04/2015    Recent Labs Lab 03/02/15 0451 03/03/15 0552 03/04/15 0609  HGB 7.8* 7.4* 6.8*   Lab Results  Component Value Date   NA 137 03/04/2015   K 4.1 03/04/2015   CL 110 03/04/2015   CO2 21* 03/04/2015   BUN 35* 03/04/2015   CREATININE 3.17* 03/04/2015   Lab Results  Component Value Date   ALT 13* 02/23/2015   AST 19 02/23/2015   ALKPHOS 64 02/23/2015   BILITOT 0.6 02/23/2015    Recent Labs Lab 03/04/15 0609  INR 1.32   Assessment/Plan: [name] is a 65 y.o. male with UGI bleed on coumadin, EGD with pyloric channel ucler.  Hgb had been stable, but down today to 6.8.   Stool is brown and pyloric channel ucler was likely source of bleeding.    Recommendations: - I suspect the Hgb of 6.8 this am is partly spurious.   - Would recommend repeating the Hgb to confirm before giving prbc - consider colonoscopy if Hgb continues to trend down.   Please call with questions or concerns.  REIN, Grace Blight, MD

## 2015-03-04 NOTE — Progress Notes (Signed)
ANTICOAGULATION CONSULT NOTE - Follow Up Consult  Pharmacy Consult for Heparin Indication: DVT, antiphospholipid syndrome  Allergies  Allergen Reactions  . Statins Nausea And Vomiting and Other (See Comments)    Reaction:  Muscle pain     Patient Measurements: Height: 5\' 7"  (170.2 cm) Weight: 142 lb 12.8 oz (64.774 kg) IBW/kg (Calculated) : 66.1 Heparin Dosing Weight: 64.8 kg  Vital Signs: Temp: 97.9 F (36.6 C) (01/25 0454) Temp Source: Oral (01/25 0454) BP: 141/63 mmHg (01/25 0454) Pulse Rate: 66 (01/25 0454)  Labs:  Recent Labs  03/02/15 0451  03/02/15 1722 03/03/15 0552 03/04/15 0609  HGB 7.8*  --   --  7.4* 6.8*  HCT 23.9*  --   --  22.8* 21.3*  PLT 157  --   --  160 161  LABPROT 16.4*  --   --  15.8* 16.5*  INR 1.31  --   --  1.24 1.32  HEPARINUNFRC  --   < > 0.60 0.48 0.50  CREATININE  --   --   --  3.19*  --   < > = values in this interval not displayed.  Estimated Creatinine Clearance: 20.9 mL/min (by C-G formula based on Cr of 3.19).   Medications:  Scheduled:  . amLODipine  10 mg Oral Q1200  . calcitRIOL  0.25 mcg Oral QHS  . carvedilol  6.25 mg Oral BID WC  . citalopram  10 mg Oral QHS  . donepezil  10 mg Oral QHS  . losartan  50 mg Oral QHS  . montelukast  10 mg Oral QHS  . mycophenolate  500 mg Oral BID  . pantoprazole  40 mg Oral BID AC  . predniSONE  5 mg Oral Daily  . senna-docusate  1 tablet Oral BID  . tacrolimus  0.5 mg Oral QHS  . warfarin  3 mg Oral q1800  . Warfarin - Pharmacist Dosing Inpatient   Does not apply q1800   Infusions:  . sodium chloride 75 mL/hr at 03/04/15 0417  . heparin 1,050 Units/hr (03/03/15 1242)    Assessment: Patient restarted on Coumadin with Heparin bridge on 1/22.  1/23 HL=0.51 1/23 HL @ 1722 = 0.6 1/24 HL =0.48 1/25 HL = 0.50  Goal of Therapy:  Heparin level 0.3-0.7 units/ml Monitor platelets by anticoagulation protocol: Yes   Plan:  Will continue with current rate of 1050 units/hr and  check next level with am labs.  Sim Boast, PharmD, BCPS  03/04/2015

## 2015-03-04 NOTE — Progress Notes (Signed)
Patient A/O, no noted distress. Denies pain. Tolerated meds well. Patient slept well throughout the night. Continue to have heparin 10.25ml/hr. Staff will continue to monitor and meet needs.

## 2015-03-04 NOTE — Progress Notes (Signed)
[facility]  ROUNDING NOTE   Subjective:   Creatinine the same Hemoglobin low but GI thinks this is spurious.   Objective:  Vital signs in last 24 hours:  Temp:  [97.9 F (36.6 C)-99.6 F (37.6 C)] 99.6 F (37.6 C) (01/25 1208) Pulse Rate:  [64-66] 65 (01/25 1208) Resp:  [16-20] 20 (01/25 1208) BP: (122-141)/(53-63) 122/55 mmHg (01/25 1208) SpO2:  [96 %-99 %] 99 % (01/25 1208)  Weight change:  Filed Weights   02/23/15 1558 02/23/15 1953  Weight: 65.772 kg (145 lb) 64.774 kg (142 lb 12.8 oz)    Intake/Output: I/O last 3 completed shifts: In: 1662.5 [P.O.:120; I.V.:1542.5] Out: -    Intake/Output this shift:  Total I/O In: 1131.3 [I.V.:1131.3] Out: -   Physical Exam: General: NAD, laying in bed  Head: Normocephalic, atraumatic. Moist oral mucosal membranes  Eyes: Anicteric  Neck: Supple, trachea midline  Lungs:  Clear to auscultation, normal effort   Heart: Regular rate and rhythm  Abdomen:  Soft, nontender, BS present, right lower quadrant allograft kidney  Extremities: no peripheral edema.  Neurologic: Nonfocal, moving all four extremities  Skin: No lesions       Basic Metabolic Panel:  Recent Labs Lab 02/26/15 0504 02/27/15 0651 03/03/15 0552 03/04/15 0609  NA 139 136 138 137  K 3.2* 3.2* 3.8 4.1  CL 110 105 110 110  CO2 26 25 22  21*  GLUCOSE 90 92 92 87  BUN 24* 20 40* 35*  CREATININE 1.92* 1.94* 3.19* 3.17*  CALCIUM 8.6* 8.5* 8.7* 8.5*  MG  --  1.8  --   --   PHOS  --  2.2*  --   --     Liver Function Tests:  Recent Labs Lab 02/27/15 0651  ALBUMIN 3.1*   No results for input(s): LIPASE, AMYLASE in the last 168 hours. No results for input(s): AMMONIA in the last 168 hours.  CBC:  Recent Labs Lab 02/27/15 0651 02/28/15 0531 03/02/15 0451 03/03/15 0552 03/04/15 0609  WBC 6.3 7.0 7.3 7.1 6.5  HGB 7.3* 7.6* 7.8* 7.4* 6.8*  HCT 22.7* 23.2* 23.9* 22.8* 21.3*  MCV 87.3 88.9 88.5 89.0 89.4  PLT 138* 149* 157 160 161     Cardiac Enzymes: No results for input(s): CKTOTAL, CKMB, CKMBINDEX, TROPONINI in the last 168 hours.  BNP: Invalid input(s): POCBNP  CBG: No results for input(s): GLUCAP in the last 168 hours.  Microbiology: Results for orders placed or performed during the hospital encounter of 10/29/14  Urine culture     Status: None   Collection Time: 10/29/14  1:01 PM  Result Value Ref Range Status   Specimen Description URINE, RANDOM  Final   Special Requests NONE  Final   Culture 7,000 COLONIES/mL INSIGNIFICANT GROWTH  Final   Report Status 10/31/2014 FINAL  Final    Coagulation Studies:  Recent Labs  03/02/15 0451 03/03/15 0552 03/04/15 0609  LABPROT 16.4* 15.8* 16.5*  INR 1.31 1.24 1.32    Urinalysis: No results for input(s): COLORURINE, LABSPEC, PHURINE, GLUCOSEU, HGBUR, BILIRUBINUR, KETONESUR, PROTEINUR, UROBILINOGEN, NITRITE, LEUKOCYTESUR in the last 72 hours.  Invalid input(s): APPERANCEUR    Imaging: No results found.   Medications:   . heparin 1,050 Units/hr (03/04/15 0825)   . amLODipine  10 mg Oral Q1200  . calcitRIOL  0.25 mcg Oral QHS  . carvedilol  6.25 mg Oral BID WC  . citalopram  10 mg Oral QHS  . donepezil  10 mg Oral QHS  . losartan  50 mg Oral QHS  . montelukast  10 mg Oral QHS  . mycophenolate  500 mg Oral BID  . pantoprazole  40 mg Oral BID AC  . predniSONE  5 mg Oral Daily  . senna-docusate  1 tablet Oral BID  . tacrolimus  0.5 mg Oral QHS  . warfarin  3 mg Oral q1800  . Warfarin - Pharmacist Dosing Inpatient   Does not apply q1800   ondansetron  Assessment/ Plan:  65 y.o. 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   1. Acute renal failure on Chronic kidney disease stage III T Status post renal transplantation in 2001. His baseline creatinine is 2.2 from December 2016.  - most likely ATN from hypotension and anemia.  - NS at 71mL/hr -  continue mycophenolate, tacrolimus, and prednisone. - Monitor renal function, renally dose all medications.  - Losartan for renal protection  2. 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 Pt received blood transfusion this admission.  Ulcer in pylorus. Hemoglobin 6.8 Now bridging warfarin with heparin gtt. Holding transfusion at this time.    3. Hypertension.  Blood pressure at goal - continue coreg, losartan, and amlodipine.  4.  Secondary hyperparathyroidism. No PTH available.  - Continue calcitriol 0.25 g by mouth daily.   LOS: Emerson, Larrisa Cravey 1/25/20173:27 PM

## 2015-03-05 LAB — CBC
HEMATOCRIT: 20 % — AB (ref 40.0–52.0)
HEMOGLOBIN: 6.3 g/dL — AB (ref 13.0–18.0)
MCH: 27.7 pg (ref 26.0–34.0)
MCHC: 31.6 g/dL — AB (ref 32.0–36.0)
MCV: 87.7 fL (ref 80.0–100.0)
Platelets: 160 10*3/uL (ref 150–440)
RBC: 2.28 MIL/uL — ABNORMAL LOW (ref 4.40–5.90)
RDW: 14.1 % (ref 11.5–14.5)
WBC: 6.1 10*3/uL (ref 3.8–10.6)

## 2015-03-05 LAB — PROTIME-INR
INR: 1.47
Prothrombin Time: 17.9 seconds — ABNORMAL HIGH (ref 11.4–15.0)

## 2015-03-05 LAB — BASIC METABOLIC PANEL
ANION GAP: 6 (ref 5–15)
BUN: 32 mg/dL — AB (ref 6–20)
CHLORIDE: 116 mmol/L — AB (ref 101–111)
CO2: 20 mmol/L — AB (ref 22–32)
Calcium: 8.9 mg/dL (ref 8.9–10.3)
Creatinine, Ser: 2.81 mg/dL — ABNORMAL HIGH (ref 0.61–1.24)
GFR calc Af Amer: 25 mL/min — ABNORMAL LOW (ref >60)
GFR calc non Af Amer: 22 mL/min — ABNORMAL LOW (ref >60)
GLUCOSE: 100 mg/dL — AB (ref 65–99)
POTASSIUM: 4.2 mmol/L (ref 3.5–5.1)
Sodium: 142 mmol/L (ref 135–145)

## 2015-03-05 LAB — PARATHYROID HORMONE, INTACT (NO CA): PTH: 44 pg/mL (ref 15–65)

## 2015-03-05 LAB — HEPARIN LEVEL (UNFRACTIONATED): HEPARIN UNFRACTIONATED: 0.55 [IU]/mL (ref 0.30–0.70)

## 2015-03-05 MED ORDER — POLYETHYLENE GLYCOL 3350 17 GM/SCOOP PO POWD
1.0000 | Freq: Once | ORAL | Status: AC
  Administered 2015-03-06: 255 g via ORAL
  Filled 2015-03-05: qty 255

## 2015-03-05 MED ORDER — PREDNISONE 50 MG PO TABS
60.0000 mg | ORAL_TABLET | Freq: Every day | ORAL | Status: DC
  Administered 2015-03-06 – 2015-03-08 (×3): 60 mg via ORAL
  Filled 2015-03-05 (×3): qty 1

## 2015-03-05 MED ORDER — POLYETHYLENE GLYCOL 3350 17 GM/SCOOP PO POWD
1.0000 | Freq: Once | ORAL | Status: AC
  Administered 2015-03-05: 255 g via ORAL
  Filled 2015-03-05: qty 255

## 2015-03-05 MED ORDER — WARFARIN SODIUM 2 MG PO TABS: 4.0000 mg | ORAL_TABLET | Freq: Every day | ORAL | Status: DC

## 2015-03-05 MED ORDER — ONDANSETRON HCL 4 MG/2ML IJ SOLN
4.0000 mg | INTRAMUSCULAR | Status: DC | PRN
Start: 2015-03-05 — End: 2015-03-08
  Administered 2015-03-05: 22:00:00 4 mg via INTRAVENOUS
  Filled 2015-03-05: qty 2

## 2015-03-05 NOTE — Plan of Care (Addendum)
Per wife's request - after rounds please contact her via cell phone to inform of POC.  Pt is not able to relay information and therefore wife is having trouble knowing what's going on.  [phone] (cell)

## 2015-03-05 NOTE — Progress Notes (Signed)
ANTICOAGULATION CONSULT NOTE - Follow Up Consult  Pharmacy Consult for Heparin Indication: DVT, antiphospholipid syndrome  Allergies  Allergen Reactions  . Statins Nausea And Vomiting and Other (See Comments)    Reaction:  Muscle pain     Patient Measurements: Height: 5\' 7"  (170.2 cm) Weight: 142 lb 12.8 oz (64.774 kg) IBW/kg (Calculated) : 66.1 Heparin Dosing Weight: 64.8 kg  Vital Signs: Temp: 98.4 F (36.9 C) (01/26 0547) Temp Source: Oral (01/26 0547) BP: 131/59 mmHg (01/26 0547) Pulse Rate: 56 (01/26 0547)  Labs:  Recent Labs  03/03/15 0552 03/04/15 0609 03/05/15 0533  HGB 7.4* 6.8* 6.3*  HCT 22.8* 21.3* 20.0*  PLT 160 161 160  LABPROT 15.8* 16.5* 17.9*  INR 1.24 1.32 1.47  HEPARINUNFRC 0.48 0.50 0.55  CREATININE 3.19* 3.17* 2.81*    Estimated Creatinine Clearance: 23.7 mL/min (by C-G formula based on Cr of 2.81).   Medications:  Scheduled:  . amLODipine  10 mg Oral Q1200  . calcitRIOL  0.25 mcg Oral QHS  . carvedilol  6.25 mg Oral BID WC  . citalopram  10 mg Oral QHS  . donepezil  10 mg Oral QHS  . losartan  50 mg Oral QHS  . montelukast  10 mg Oral QHS  . mycophenolate  500 mg Oral BID  . pantoprazole  40 mg Oral BID AC  . predniSONE  5 mg Oral Daily  . senna-docusate  1 tablet Oral BID  . tacrolimus  0.5 mg Oral QHS  . warfarin  4 mg Oral q1800  . Warfarin - Pharmacist Dosing Inpatient   Does not apply q1800   Infusions:  . sodium chloride 50 mL/hr at 03/04/15 1702  . heparin 1,050 Units/hr (03/04/15 0825)    Assessment: Patient restarted on Coumadin with Heparin bridge on 1/22.  1/23 HL=0.51 1/23 HL @ 1722 = 0.6 1/24 HL =0.48 1/25 HL = 0.50 1/26 HL = 0.55  Goal of Therapy:  Heparin level 0.3-0.7 units/ml Monitor platelets by anticoagulation protocol: Yes   Plan:  Will continue with current rate of 1050 units/hr and check next level with am labs.  Sim Boast, PharmD, BCPS  03/05/2015

## 2015-03-05 NOTE — Progress Notes (Signed)
[place] [facility] [place] NAME: Eric Gill    MR#:  [id number]  DATE OF BIRTH:  02/02/1951  SUBJECTIVE; the patient denies any complaints. But still has very poor appetite. Had diarrhea, he sees he gets tired on and off. He never felt better after kidney transplant 20 years ago.    REVIEW OF SYSTEMS:   Review of Systems  Constitutional: Negative for fever, chills and weight loss.  HENT: Negative for ear discharge, ear pain and nosebleeds.   Eyes: Negative for blurred vision, pain and discharge.  Respiratory: Negative for sputum production, shortness of breath, wheezing and stridor.   Cardiovascular: Negative for chest pain, palpitations, orthopnea and PND.  Gastrointestinal: Negative for nausea, vomiting, abdominal pain and diarrhea.  Genitourinary: Negative for urgency and frequency.  Musculoskeletal: Negative for back pain and joint pain.  Neurological: Negative for sensory change, speech change, focal weakness and weakness.  Psychiatric/Behavioral: Negative for depression and hallucinations. The patient is not nervous/anxious.   All other systems reviewed and are negative.  Tolerating Diet:yes Tolerating PT: not needed  DRUG ALLERGIES:   Allergies  Allergen Reactions  . Statins Nausea And Vomiting and Other (See Comments)    Reaction:  Muscle pain    VITALS:  Blood pressure 142/62, pulse 64, temperature 98.4 F (36.9 C), temperature source Oral, resp. rate 18, height 5\' 7"  (1.702 m), weight 64.774 kg (142 lb 12.8 oz), SpO2 97 %. PHYSICAL EXAMINATION:   Physical Exam  GENERAL:  65 y.o.-year-old patient lying in the bed with no acute distress.  EYES: Pupils equal, round, reactive to light and accommodation. No scleral icterus. Extraocular muscles intact.  HEENT: Head atraumatic, normocephalic. Oropharynx and nasopharynx clear.  NECK:  Supple, no jugular venous distention. No thyroid enlargement, no tenderness.  LUNGS: Normal  breath sounds bilaterally, no wheezing, rales, rhonchi. No use of accessory muscles of respiration.  CARDIOVASCULAR: S1, S2 normal. No murmurs, rubs, or gallops.  ABDOMEN: Soft, nontender, nondistended. Bowel sounds present. No organomegaly or mass.  EXTREMITIES: No cyanosis, clubbing or edema b/l.    NEUROLOGIC: Cranial nerves II through XII are intact. No focal Motor or sensory deficits b/l.   PSYCHIATRIC: The patient is alert and oriented x 3.  SKIN: No obvious rash, lesion, or ulcer.  LABORATORY PANEL:  CBC  Recent Labs Lab 03/05/15 0533  WBC 6.1  HGB 6.3*  HCT 20.0*  PLT 160    Chemistries   Recent Labs Lab 02/27/15 0651  03/05/15 0533  NA 136  < > 142  K 3.2*  < > 4.2  CL 105  < > 116*  CO2 25  < > 20*  GLUCOSE 92  < > 100*  BUN 20  < > 32*  CREATININE 1.94*  < > 2.81*  CALCIUM 8.5*  < > 8.9  MG 1.8  --   --   < > = values in this interval not displayed.   ASSESSMENT AND PLAN:  Eric Gill is a 65 y.o. male with a known history of status post kidney transplant, hyperparathyroidism, DVT, TIA, antiphospholipid syndrome, chronic Coumadin use, vascular dementia, hyperlipidemia came in with coffee ground vomitus  * Acute blood loss anemia secondary to most likely upper GI bleed Hemoglobin is 7.3--6.7--6.2--1 unit BT--7.0-7.3-7.3--7.6--7.8--7.4-6.8-6.3 EGD showed gastric ulcer. -Continue  po Protonix twice a day Appreciate  GI following, possibly need a colonoscopy if hemoglobin continues to be low after discontinuing IV fluids.  *  Status post kidney transplant; His  renal function is stable and he is on multiple transplant medications. -appreciate nephrology input  Continue amlodipine, coreg, cozaar for now and follow his blood pressure. Stable.  * Antiphospholipid syndrome;  On  heparin drip and Coumadin. -He also had history of blood clots in the past and TIAs and he was advised to take Coumadin. -Dr. Dorise Bullion spoke to patient's hematologist at [facility]   INR is slowly rising but not therauptic yet so continue heparin, Coumadin.  * TIA and vascular dementia Continue home medications.  Management plans discussed with the patient, family and they are in agreement.  * acute on chronic renal failure CKD stage 3; history of kidney transplant in 2001. Baseline creatinine 2.2. Creatinine is slowly improving. Continue IV hydration. Appreciate nephrology following the patient. ,  Hold  furosemide.  needs inpatient stay still because of renal failure, GI bleed, subacute therapeutic INR . Management plans discussed with the patient, family and they are in agreement.  Case discussed with Care Management/Social Worker.  CODE STATUS: full  DVT Prophylaxis: scd, heparin gtt  TOTAL TIME TAKING CARE OF THIS PATIENT:25 minutes.   >50% time spent on counselling and coordination of care pt and wife    Note: This dictation was prepared with Dragon dictation along with smaller phrase technology. Any transcriptional errors that result from this process are unintentional.  Ernesto Lashway M.D on 03/05/2015 at 10:03 AM  Between 7am to 6pm - Pager - [phone]  After 6pm go to [email or web address] - password [email or web address] [place]  Office  [phone]  CC: Primary care physician; Idelle Crouch, MD

## 2015-03-05 NOTE — Plan of Care (Signed)
Problem: Physical Regulation: Goal: Complications related to the disease process, condition or treatment will be avoided or minimized Outcome: Not Progressing Hgb 6.8. No signs of active bleeding. BUN and CR still elevated. INR wnl. Continues on Heparin drip at 10.5. Next labs this am.

## 2015-03-05 NOTE — Progress Notes (Signed)
[facility]  ROUNDING NOTE   Subjective:   Wife at bedside.  Creatinine 2.81 (3.17) Hgb 6.3 (6.8)  Objective:  Vital signs in last 24 hours:  Temp:  [97.9 F (36.6 C)-98.9 F (37.2 C)] 97.9 F (36.6 C) (01/26 1342) Pulse Rate:  [56-85] 60 (01/26 1342) Resp:  [18-20] 20 (01/26 1342) BP: (129-142)/(56-72) 132/56 mmHg (01/26 1342) SpO2:  [97 %-100 %] 100 % (01/26 1342)  Weight change:  Filed Weights   02/23/15 1558 02/23/15 1953  Weight: 65.772 kg (145 lb) 64.774 kg (142 lb 12.8 oz)    Intake/Output: I/O last 3 completed shifts: In: 2383.7 [I.V.:2383.7] Out: -    Intake/Output this shift:     Physical Exam: General: NAD, laying in bed  Head: Normocephalic, atraumatic. Moist oral mucosal membranes  Eyes: Anicteric  Neck: Supple, trachea midline  Lungs:  Clear to auscultation, normal effort   Heart: Regular rate and rhythm  Abdomen:  Soft, nontender, BS present, right lower quadrant allograft kidney  Extremities: no peripheral edema.  Neurologic: Nonfocal, moving all four extremities  Skin: No lesions       Basic Metabolic Panel:  Recent Labs Lab 02/27/15 0651 03/03/15 0552 03/04/15 0609 03/05/15 0533  NA 136 138 137 142  K 3.2* 3.8 4.1 4.2  CL 105 110 110 116*  CO2 25 22 21* 20*  GLUCOSE 92 92 87 100*  BUN 20 40* 35* 32*  CREATININE 1.94* 3.19* 3.17* 2.81*  CALCIUM 8.5* 8.7* 8.5* 8.9  MG 1.8  --   --   --   PHOS 2.2*  --   --   --     Liver Function Tests:  Recent Labs Lab 02/27/15 0651  ALBUMIN 3.1*   No results for input(s): LIPASE, AMYLASE in the last 168 hours. No results for input(s): AMMONIA in the last 168 hours.  CBC:  Recent Labs Lab 02/28/15 0531 03/02/15 0451 03/03/15 0552 03/04/15 0609 03/05/15 0533  WBC 7.0 7.3 7.1 6.5 6.1  HGB 7.6* 7.8* 7.4* 6.8* 6.3*  HCT 23.2* 23.9* 22.8* 21.3* 20.0*  MCV 88.9 88.5 89.0 89.4 87.7  PLT 149* 157 160 161 160    Cardiac Enzymes: No results for input(s): CKTOTAL, CKMB,  CKMBINDEX, TROPONINI in the last 168 hours.  BNP: Invalid input(s): POCBNP  CBG: No results for input(s): GLUCAP in the last 168 hours.  Microbiology: Results for orders placed or performed during the hospital encounter of 10/29/14  Urine culture     Status: None   Collection Time: 10/29/14  1:01 PM  Result Value Ref Range Status   Specimen Description URINE, RANDOM  Final   Special Requests NONE  Final   Culture 7,000 COLONIES/mL INSIGNIFICANT GROWTH  Final   Report Status 10/31/2014 FINAL  Final    Coagulation Studies:  Recent Labs  03/03/15 0552 03/04/15 0609 03/05/15 0533  LABPROT 15.8* 16.5* 17.9*  INR 1.24 1.32 1.47    Urinalysis: No results for input(s): COLORURINE, LABSPEC, PHURINE, GLUCOSEU, HGBUR, BILIRUBINUR, KETONESUR, PROTEINUR, UROBILINOGEN, NITRITE, LEUKOCYTESUR in the last 72 hours.  Invalid input(s): APPERANCEUR    Imaging: No results found.   Medications:   . sodium chloride 50 mL/hr at 03/05/15 1222  . heparin 1,050 Units/hr (03/05/15 1000)   . amLODipine  10 mg Oral Q1200  . calcitRIOL  0.25 mcg Oral QHS  . carvedilol  6.25 mg Oral BID WC  . citalopram  10 mg Oral QHS  . donepezil  10 mg Oral QHS  . losartan  50 mg Oral QHS  . montelukast  10 mg Oral QHS  . mycophenolate  500 mg Oral BID  . pantoprazole  40 mg Oral BID AC  . polyethylene glycol powder  1 Container Oral Once  . [START ON 03/06/2015] polyethylene glycol powder  1 Container Oral Once  . predniSONE  5 mg Oral Daily  . tacrolimus  0.5 mg Oral QHS  . warfarin  4 mg Oral q1800  . Warfarin - Pharmacist Dosing Inpatient   Does not apply q1800   loperamide, ondansetron  Assessment/ Plan:  65 y.o. 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   1. Acute renal failure on Chronic kidney disease stage III T Status post renal transplantation in 2001. His baseline creatinine is 2.2 from December 2016. Creatinine trending down - most likely ATN from hypotension and anemia.  - Continue IV fluids 50mg  IV for one more day - Continue mycophenolate. Increase prednisone to 60mg  daily due to acute stressors. Hold tacrolimus.  - Monitor renal function, renally dose all medications.  - Losartan for renal protection  2. 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 Pt received blood transfusion this admission.  Ulcer in pylorus. Hemoglobin 6.3 Now bridging warfarin with heparin gtt.However patient has gotten lovenox in the past.  Holding transfusion at this time.   - appreciate GI input.   3. Hypertension.  Blood pressure at goal - continue coreg, losartan, and amlodipine.  4.  Secondary hyperparathyroidism. PTH 44 - Discontinue calcitriol for now   LOS: 10 Katieann Hungate 1/26/20171:52 PM

## 2015-03-05 NOTE — Progress Notes (Signed)
ANTICOAGULATION CONSULT NOTE - Follow Up Consult  Pharmacy Consult for Coumadin Indication: Antiphospholipid symdrome, DVT  Allergies  Allergen Reactions  . Statins Nausea And Vomiting and Other (See Comments)    Reaction:  Muscle pain     Patient Measurements: Height: 5\' 7"  (170.2 cm) Weight: 142 lb 12.8 oz (64.774 kg) IBW/kg (Calculated) : 66.1 Heparin Dosing Weight: 64.8 kg  Vital Signs: Temp: 98.4 F (36.9 C) (01/26 0547) Temp Source: Oral (01/26 0547) BP: 131/59 mmHg (01/26 0547) Pulse Rate: 56 (01/26 0547)  Labs:  Recent Labs  03/03/15 0552 03/04/15 0609 03/05/15 0533  HGB 7.4* 6.8* 6.3*  HCT 22.8* 21.3* 20.0*  PLT 160 161 160  LABPROT 15.8* 16.5* 17.9*  INR 1.24 1.32 1.47  HEPARINUNFRC 0.48 0.50 0.55  CREATININE 3.19* 3.17* 2.81*    Estimated Creatinine Clearance: 23.7 mL/min (by C-G formula based on Cr of 2.81).   Medications:  Scheduled:  . amLODipine  10 mg Oral Q1200  . calcitRIOL  0.25 mcg Oral QHS  . carvedilol  6.25 mg Oral BID WC  . citalopram  10 mg Oral QHS  . donepezil  10 mg Oral QHS  . losartan  50 mg Oral QHS  . montelukast  10 mg Oral QHS  . mycophenolate  500 mg Oral BID  . pantoprazole  40 mg Oral BID AC  . predniSONE  5 mg Oral Daily  . senna-docusate  1 tablet Oral BID  . tacrolimus  0.5 mg Oral QHS  . warfarin  3 mg Oral q1800  . Warfarin - Pharmacist Dosing Inpatient   Does not apply q1800   Infusions:  . sodium chloride 50 mL/hr at 03/04/15 1702  . heparin 1,050 Units/hr (03/04/15 0825)    Assessment: Patient restarted on Coumadin with Heparin bridge on 1/22.   1/22 INR = 1.47 1/23 INR=1.31 1/24 INR = 1.24 1/25 INR = 1.32 1/26 INR = 1.47  Goal of Therapy:  INR 2-3 Monitor platelets by anticoagulation protocol: Yes   Plan:  Increase warfarin to 4 mg daily. F/u INR in AM.  Sim Boast, PharmD, BCPS  03/05/2015

## 2015-03-06 ENCOUNTER — Encounter: Payer: Self-pay | Admitting: Anesthesiology

## 2015-03-06 ENCOUNTER — Encounter
Admission: EM | Disposition: A | Discharge status: Home / Self Care | Payer: Self-pay | Source: Home / Self Care | Attending: Internal Medicine

## 2015-03-06 ENCOUNTER — Inpatient Hospital Stay: Payer: Medicare Other | Admitting: Anesthesiology

## 2015-03-06 HISTORY — PX: ESOPHAGOGASTRODUODENOSCOPY (EGD) WITH PROPOFOL: SHX5813

## 2015-03-06 HISTORY — PX: COLONOSCOPY WITH PROPOFOL: SHX5780

## 2015-03-06 LAB — RENAL FUNCTION PANEL
ANION GAP: 5 (ref 5–15)
Albumin: 3 g/dL — ABNORMAL LOW (ref 3.5–5.0)
BUN: 26 mg/dL — AB (ref 6–20)
CO2: 18 mmol/L — ABNORMAL LOW (ref 22–32)
Calcium: 8.6 mg/dL — ABNORMAL LOW (ref 8.9–10.3)
Chloride: 112 mmol/L — ABNORMAL HIGH (ref 101–111)
Creatinine, Ser: 2.77 mg/dL — ABNORMAL HIGH (ref 0.61–1.24)
GFR calc Af Amer: 26 mL/min — ABNORMAL LOW (ref >60)
GFR, EST NON AFRICAN AMERICAN: 22 mL/min — AB (ref >60)
Glucose, Bld: 89 mg/dL (ref 65–99)
PHOSPHORUS: 3 mg/dL (ref 2.5–4.6)
POTASSIUM: 4.2 mmol/L (ref 3.5–5.1)
Sodium: 135 mmol/L (ref 135–145)

## 2015-03-06 LAB — CBC
HEMATOCRIT: 20.7 % — AB (ref 40.0–52.0)
Hemoglobin: 6.7 g/dL — ABNORMAL LOW (ref 13.0–18.0)
MCH: 28.3 pg (ref 26.0–34.0)
MCHC: 32.3 g/dL (ref 32.0–36.0)
MCV: 87.5 fL (ref 80.0–100.0)
Platelets: 171 10*3/uL (ref 150–440)
RBC: 2.36 MIL/uL — ABNORMAL LOW (ref 4.40–5.90)
RDW: 14.3 % (ref 11.5–14.5)
WBC: 5.2 10*3/uL (ref 3.8–10.6)

## 2015-03-06 LAB — PROTIME-INR
INR: 1.49
PROTHROMBIN TIME: 18.1 s — AB (ref 11.4–15.0)

## 2015-03-06 LAB — HEPARIN LEVEL (UNFRACTIONATED): Heparin Unfractionated: 0.54 IU/mL (ref 0.30–0.70)

## 2015-03-06 SURGERY — COLONOSCOPY WITH PROPOFOL
Anesthesia: General

## 2015-03-06 MED ORDER — SUCRALFATE 1 GM/10ML PO SUSP
1.0000 g | Freq: Four times a day (QID) | ORAL | Status: DC
  Administered 2015-03-07 – 2015-03-08 (×8): 1 g via ORAL
  Filled 2015-03-06 (×8): qty 10

## 2015-03-06 MED ORDER — HEPARIN (PORCINE) IN NACL 100-0.45 UNIT/ML-% IJ SOLN
1050.0000 [IU]/h | INTRAMUSCULAR | Status: DC
  Administered 2015-03-06 – 2015-03-07 (×2): 1050 [IU]/h via INTRAVENOUS
  Filled 2015-03-06 (×4): qty 250

## 2015-03-06 MED ORDER — LACTATED RINGERS IV SOLN
INTRAVENOUS | Status: DC | PRN
  Administered 2015-03-06: 15:00:00 via INTRAVENOUS

## 2015-03-06 MED ORDER — WARFARIN SODIUM 5 MG PO TABS
5.0000 mg | ORAL_TABLET | Freq: Every day | ORAL | Status: DC
  Administered 2015-03-06 – 2015-03-07 (×2): 5 mg via ORAL
  Filled 2015-03-06 (×2): qty 1

## 2015-03-06 MED ORDER — PROPOFOL 10 MG/ML IV BOLUS
INTRAVENOUS | Status: DC | PRN
  Administered 2015-03-06: 250 mg via INTRAVENOUS

## 2015-03-06 MED ORDER — ENOXAPARIN SODIUM 30 MG/0.3ML ~~LOC~~ SOLN: 65.0000 mg | SUBCUTANEOUS | Status: DC

## 2015-03-06 MED ORDER — PANTOPRAZOLE SODIUM 40 MG PO TBEC: 40.0000 mg | DELAYED_RELEASE_TABLET | Freq: Two times a day (BID) | ORAL | Status: DC

## 2015-03-06 MED ORDER — SODIUM CHLORIDE 0.9 % IV SOLN: INTRAVENOUS | Status: DC

## 2015-03-06 MED ORDER — SODIUM BICARBONATE 650 MG PO TABS
650.0000 mg | ORAL_TABLET | Freq: Three times a day (TID) | ORAL | Status: DC
  Administered 2015-03-06 – 2015-03-08 (×7): 650 mg via ORAL
  Filled 2015-03-06 (×10): qty 1

## 2015-03-06 MED ORDER — SODIUM CHLORIDE 0.9 % IV SOLN
INTRAVENOUS | Status: DC
  Administered 2015-03-06: 1000 mL via INTRAVENOUS

## 2015-03-06 NOTE — Op Note (Signed)
[facility] Patient Name: Eric Gill Procedure Date: 03/06/2015 3:31 PM MRN: [id number] Account #: [id number] Date of Birth: [birth year] Admit Type: Inpatient Age: 65 Room: [facility] ENDO ROOM 4 Gender: Male Note Status: Finalized Procedure:         Upper GI endoscopy Indications:       Acute post hemorrhagic anemia, drop in Hgb once starting                     coumadin and heparin drip, known pyloric channel ulcer. Patient Profile:   This is a 65 year old male. Providers:         Gerrit Heck. Rayann Heman, MD Medicines:         Propofol per Anesthesia Complications:     No immediate complications. Estimated blood loss: Minimal. Procedure:         Pre-Anesthesia Assessment:                    - Prior to the procedure, a History and Physical was                     performed, and patient medications, allergies and                     sensitivities were reviewed. The patient's tolerance of                     previous anesthesia was reviewed.                    After obtaining informed consent, the endoscope was passed                     under direct vision. Throughout the procedure, the                     patient's blood pressure, pulse, and oxygen saturations                     were monitored continuously. The Endoscope was introduced                     through the mouth, and advanced to the second part of                     duodenum. The upper GI endoscopy was accomplished without                     difficulty. The patient tolerated the procedure well. Findings:      A medium-sized hiatus hernia was present.      A single 4 mm sessile polyp with no bleeding and no stigmata of recent       bleeding was found in the cardia.      One non-bleeding superficial gastric ulcer with no stigmata of bleeding       was found in pyloric channel. The lesion was 5 mm in largest dimension.      The examined duodenum was normal.      Using the endoscope, the video  capsule enteroscope was advanced into the       duodenal bulb. Impression:        - Medium-sized hiatus hernia.                    - A single gastric polyp.                    -  Gastric ulcer with clean base in pyloric channel.                    - Normal examined duodenum.                    - Successful completion of the Video Capsule Enteroscope                     placement. Slight oozing from plyoric channel after                     capsule placement. Did not appear to be oozing from ulcer.                    - No specimens collected. Recommendation:    - Observe patient in GI recovery unit.                    - npo for 2 hours after EGD, then clear liquids for 2                     hours, then solids 4 hours after EGD.                    - Continue present medications.                    - Continue protonix 40 BID for 2 months.                    - Use carafate solution 1 gr q6 hours for one week                    - Close monitoring of Hgb as outpatient, will require                     tranfusion if Hgb drops further.                    - Ok to continue coumadin                    - Cont heparin drip                    - Safe for d/c once Hgb stable in am.                    - Repeat the upper endoscopy in 8 weeks to check healing.                    - Avoid NSAIDS                    - Check h pylori serology                    - The findings and recommendations were discussed with the                     patient.                    - The findings and recommendations were discussed with the                     patient's family. Procedure Code(s): --- Professional ---  [id number], Esophagogastroduodenoscopy, flexible, transoral;                     diagnostic, including collection of specimen(s) by                     brushing or washing, when performed (separate procedure) Diagnosis Code(s): --- Professional ---                    K44.9, Diaphragmatic hernia  without obstruction or gangrene                    K31.7, Polyp of stomach and duodenum                    K25.9, Gastric ulcer, unspecified as acute or chronic,                     without hemorrhage or perforation                    D62, Acute posthemorrhagic anemia                    K92.0, Hematemesis CPT copyright 2014 American Medical Association. All rights reserved. The codes documented in this report are preliminary and upon coder review may  be revised to meet current compliance requirements. Mellody Life, MD 03/06/2015 3:55:33 PM This report has been signed electronically. Number of Addenda: 0 Note Initiated On: 03/06/2015 3:31 PM      [facility]

## 2015-03-06 NOTE — Plan of Care (Signed)
Problem: Bowel/Gastric: Goal: Will show no signs and symptoms of gastrointestinal bleeding Outcome: Not Met (add Reason) Hgb still < 7. No orders to transfuse. Currently being prepped for colonoscopy. Plan is for him to the procedural area around 1400.   Problem: Physical Regulation: Goal: Complications related to the disease process, condition or treatment will be avoided or minimized Outcome: Not Met (add Reason) BUN and Cr still elevated. Nephrologist following.

## 2015-03-06 NOTE — Op Note (Signed)
[facility] Patient Name: Eric Gill Procedure Date: 03/06/2015 3:08 PM MRN: [id number] Account #: [id number] Date of Birth: Oct 15, 1950 Admit Type: Outpatient Age: 65 Room: [facility] ENDO ROOM 4 Gender: Male Note Status: Finalized Procedure:         Colonoscopy Indications:       Gastrointestinal bleeding, Acute post hemorrhagic anemia Patient Profile:   This is a 65 year old male. Providers:         Gerrit Heck. Rayann Heman, MD Medicines:         Propofol per Anesthesia Complications:     No immediate complications. Procedure:         Pre-Anesthesia Assessment:                    - Prior to the procedure, a History and Physical was                     performed, and patient medications, allergies and                     sensitivities were reviewed. The patient's tolerance of                     previous anesthesia was reviewed.                    After obtaining informed consent, the colonoscope was                     passed under direct vision. Throughout the procedure, the                     patient's blood pressure, pulse, and oxygen saturations                     were monitored continuously. The Colonoscope was                     introduced through the anus and advanced to the the                     terminal ileum. The colonoscopy was performed without                     difficulty. The patient tolerated the procedure well. The                     quality of the bowel preparation was good. Findings:      The perianal exam findings include non-thrombosed external hemorrhoids.      A few small-mouthed diverticula were found in the sigmoid colon.      Internal hemorrhoids were found during retroflexion. The hemorrhoids       were Grade I (internal hemorrhoids that do not prolapse).      The exam was otherwise without abnormality. Impression:        - Non-thrombosed external hemorrhoids found on perianal                     exam.  - Diverticulosis in the sigmoid colon.                    - Internal hemorrhoids.                    - The examination was otherwise normal.                    -  No specimens collected. Recommendation:    - Perform an upper GI endoscopy today.                    - Continue present medications.                    - The findings and recommendations were discussed with the                     patient.                    - The findings and recommendations were discussed with the                     patient's family. Procedure Code(s): --- Professional ---                    [phone], Colonoscopy, flexible; diagnostic, including                     collection of specimen(s) by brushing or washing, when                     performed (separate procedure) Diagnosis Code(s): --- Professional ---                    K64.0, First degree hemorrhoids                    K64.4, Residual hemorrhoidal skin tags                    K92.2, Gastrointestinal hemorrhage, unspecified                    D62, Acute posthemorrhagic anemia                    K57.30, Diverticulosis of large intestine without                     perforation or abscess without bleeding CPT copyright 2014 American Medical Association. All rights reserved. The codes documented in this report are preliminary and upon coder review may  be revised to meet current compliance requirements. Mellody Life, MD 03/06/2015 3:28:45 PM This report has been signed electronically. Number of Addenda: 0 Note Initiated On: 03/06/2015 3:08 PM Scope Withdrawal Time: 0 hours 8 minutes 36 seconds  Total Procedure Duration: 0 hours 12 minutes 8 seconds       [facility]

## 2015-03-06 NOTE — Progress Notes (Signed)
[place] [facility] [place] NAME: Eric Gill    MR#:  [id number]  DATE OF BIRTH:  1950-02-22  SUBJECTIVE; the patient is going for colonoscopy today after that if it's negative for any bleeding will discharge the patient home with Lovenox and Coumadin. Family is refusing blood transfusion even though it's okay with the transplant team.    REVIEW OF SYSTEMS:   Review of Systems  Constitutional: Negative for fever, chills and weight loss.  HENT: Negative for ear discharge, ear pain and nosebleeds.   Eyes: Negative for blurred vision, pain and discharge.  Respiratory: Negative for sputum production, shortness of breath, wheezing and stridor.   Cardiovascular: Negative for chest pain, palpitations, orthopnea and PND.  Gastrointestinal: Negative for nausea, vomiting, abdominal pain and diarrhea.  Genitourinary: Negative for urgency and frequency.  Musculoskeletal: Negative for back pain and joint pain.  Neurological: Negative for sensory change, speech change, focal weakness and weakness.  Psychiatric/Behavioral: Negative for depression and hallucinations. The patient is not nervous/anxious.   All other systems reviewed and are negative.  Tolerating Diet:yes Tolerating PT: not needed  DRUG ALLERGIES:   Allergies  Allergen Reactions  . Statins Nausea And Vomiting and Other (See Comments)    Reaction:  Muscle pain    VITALS:  Blood pressure 133/58, pulse 60, temperature 98.2 F (36.8 C), temperature source Oral, resp. rate 18, height 5\' 7"  (1.702 m), weight 64.774 kg (142 lb 12.8 oz), SpO2 98 %. PHYSICAL EXAMINATION:   Physical Exam  GENERAL:  65 y.o.-year-old patient lying in the bed with no acute distress.  EYES: Pupils equal, round, reactive to light and accommodation. No scleral icterus. Extraocular muscles intact.  HEENT: Head atraumatic, normocephalic. Oropharynx and nasopharynx clear.  NECK:  Supple, no jugular venous  distention. No thyroid enlargement, no tenderness.  LUNGS: Normal breath sounds bilaterally, no wheezing, rales, rhonchi. No use of accessory muscles of respiration.  CARDIOVASCULAR: S1, S2 normal. No murmurs, rubs, or gallops.  ABDOMEN: Soft, nontender, nondistended. Bowel sounds present. No organomegaly or mass.  EXTREMITIES: No cyanosis, clubbing or edema b/l.    NEUROLOGIC: Cranial nerves II through XII are intact. No focal Motor or sensory deficits b/l.   PSYCHIATRIC: The patient is alert and oriented x 3.  SKIN: No obvious rash, lesion, or ulcer.  LABORATORY PANEL:  CBC  Recent Labs Lab 03/06/15 0631  WBC 5.2  HGB 6.7*  HCT 20.7*  PLT 171    Chemistries   Recent Labs Lab 03/06/15 0631  NA 135  K 4.2  CL 112*  CO2 18*  GLUCOSE 89  BUN 26*  CREATININE 2.77*  CALCIUM 8.6*     ASSESSMENT AND PLAN:  Eric Gill is a 65 y.o. male with a known history of status post kidney transplant, hyperparathyroidism, DVT, TIA, antiphospholipid syndrome, chronic Coumadin use, vascular dementia, hyperlipidemia came in with coffee ground vomitus  * Acute blood loss anemia secondary to most likely upper GI bleed Hemoglobin is 7.3--6.7--6.2--1 unit BT--7.0-7.3-7.3--7.6--7.8--7.4-6.8-6.3 EGD showed gastric ulcer. -Continue  po Protonix twice a day For  colonoscopy today.likely d/c after colonosocpy if negative .  *  Status post kidney transplant; His renal function is stable and he is on multiple transplant medications. -appreciate nephrology input  Continue amlodipine, coreg, cozaar for now and follow his blood pressure. Stable.  * Antiphospholipid syndrome;  Stopped heparin drip for colonoscopy. -He also had history of blood clots in the past and TIAs and he was advised to  take Coumadin. -Dr. Dorise Bullion spoke to patient's hematologist at [facility]  INR is slowly rising but not therauptic yet so continue heparin, Coumadin.  * TIA and vascular dementia Continue home  medications.  Management plans discussed with the patient, family and they are in agreement.  * acute on chronic renal failure CKD stage 3; history of kidney transplant in 2001. Baseline creatinine 2.2. Creatinine is slowly improving. [name] Appreciate nephrology following the patient. ,  Hold  furosemide.   . Management plans discussed with the patient, family and they are in agreement Likely discharge home today with lovenox and coumadin,.wife agreed for this.  Case discussed with Care Management/Social Worker.  CODE STATUS: full  DVT Prophylaxis: scd, heparin gtt  TOTAL TIME TAKING CARE OF THIS PATIENT:25 minutes.   >50% time spent on counselling and coordination of care pt and wife    Note: This dictation was prepared with Dragon dictation along with smaller phrase technology. Any transcriptional errors that result from this process are unintentional.  Eric Gill M.D on 03/06/2015 at 12:50 PM  Between 7am to 6pm - Pager - [phone]  After 6pm go to [email or web address] - password [email or web address] [facility]  Office  [phone]  CC: Primary care physician; Idelle Crouch, MD

## 2015-03-06 NOTE — H&P (View-Only) (Signed)
GI Inpatient Follow-up Note  Patient Identification: Eric Gill is a 65 y.o. male with UGI bleed on coumadin.   EGD: with pyloric channel ulcer  Subjective:  No rectal bleeding or melena. No abd pain. NO f/c.  Tired of being in hosp. On Heparin drp and coumadin.  Hgb down some today.   Scheduled Inpatient Medications:  . amLODipine  10 mg Oral Q1200  . calcitRIOL  0.25 mcg Oral QHS  . carvedilol  6.25 mg Oral BID WC  . citalopram  10 mg Oral QHS  . donepezil  10 mg Oral QHS  . losartan  50 mg Oral QHS  . montelukast  10 mg Oral QHS  . mycophenolate  500 mg Oral BID  . pantoprazole  40 mg Oral BID AC  . predniSONE  5 mg Oral Daily  . senna-docusate  1 tablet Oral BID  . tacrolimus  0.5 mg Oral QHS  . warfarin  3 mg Oral q1800  . Warfarin - Pharmacist Dosing Inpatient   Does not apply q1800    Continuous Inpatient Infusions:   . sodium chloride 75 mL/hr at 03/04/15 0417  . heparin 1,050 Units/hr (03/04/15 0825)    PRN Inpatient Medications:  ondansetron  Review of Systems: Constitutional: Weight is stable.  Eyes: No changes in vision. ENT: No oral lesions, sore throat.  GI: see HPI.  Heme/Lymph: No easy bruising.  CV: No chest pain.  GU: No hematuria.  Integumentary: No rashes.  Neuro: No headaches.  Psych: No depression/anxiety.  Endocrine: No heat/cold intolerance.  Allergic/Immunologic: No urticaria.  Resp: No cough, SOB.  Musculoskeletal: No joint swelling.    Physical Examination: BP 122/55 mmHg  Pulse 65  Temp(Src) 99.6 F (37.6 C) (Oral)  Resp 20  Ht 5\' 7"  (1.702 m)  Wt 64.774 kg (142 lb 12.8 oz)  BMI 22.36 kg/m2  SpO2 99% Gen: NAD, alert and oriented x 4 Neck: supple, no JVD or thyromegaly Chest: CTA bilaterally, no wheezes, crackles, or other adventitious sounds CV: RRR, no m/g/c/r Abd: soft, NT, ND, +BS in all four quadrants; no HSM, guarding, ridigity, or rebound tenderness Ext: no edema, well perfused with 2+ pulses, Skin: no rash  or lesions noted Lymph: no LAD  Data: Lab Results  Component Value Date   WBC 6.5 03/04/2015   HGB 6.8* 03/04/2015   HCT 21.3* 03/04/2015   MCV 89.4 03/04/2015   PLT 161 03/04/2015    Recent Labs Lab 03/02/15 0451 03/03/15 0552 03/04/15 0609  HGB 7.8* 7.4* 6.8*   Lab Results  Component Value Date   NA 137 03/04/2015   K 4.1 03/04/2015   CL 110 03/04/2015   CO2 21* 03/04/2015   BUN 35* 03/04/2015   CREATININE 3.17* 03/04/2015   Lab Results  Component Value Date   ALT 13* 02/23/2015   AST 19 02/23/2015   ALKPHOS 64 02/23/2015   BILITOT 0.6 02/23/2015    Recent Labs Lab 03/04/15 0609  INR 1.32   Assessment/Plan: [name] is a 65 y.o. male with UGI bleed on coumadin, EGD with pyloric channel ucler.  Hgb had been stable, but down today to 6.8.   Stool is brown and pyloric channel ucler was likely source of bleeding.    Recommendations: - I suspect the Hgb of 6.8 this am is partly spurious.   - Would recommend repeating the Hgb to confirm before giving prbc - consider colonoscopy if Hgb continues to trend down.   Please call with questions or concerns.  Edwyna Dangerfield, Grace Blight, MD

## 2015-03-06 NOTE — Progress Notes (Signed)
GI Note:  Colon: diverticulosis, int hem, ext hem EGD:  pylroic chanenl ulcer with no stigmata, pill cam deployed.    Recs: - restart heparin drip - cont coumadin - carafate solution q6 - BID PPI for 8 weeks - repeat EGD in 8 weeks - h pylori serology - safe for d/c one hgb stable - capsule read pending.

## 2015-03-06 NOTE — Progress Notes (Signed)
Dr. Rayann Heman called and asked heparin drip to be stopped for colonoscopy today.

## 2015-03-06 NOTE — Care Management Important Message (Signed)
Important Message  Patient Details  Name: Eric Gill MRN: [id number] Date of Birth: April 26, 1950   Medicare Important Message Given:  Yes    Juliann Pulse A Haygen Zebrowski 03/06/2015, 9:51 AM

## 2015-03-06 NOTE — Transfer of Care (Signed)
Immediate Anesthesia Transfer of Care Note  Patient: Eric Gill  Procedure(s) Performed: Procedure(s): COLONOSCOPY WITH PROPOFOL (N/A) ESOPHAGOGASTRODUODENOSCOPY (EGD) WITH PROPOFOL  Patient Location: PACU  Anesthesia Type:General  Level of Consciousness: awake, alert  and oriented  Airway & Oxygen Therapy: Patient Spontanous Breathing and Patient connected to nasal cannula oxygen  Post-op Assessment: Report given to RN and Post -op Vital signs reviewed and stable  Post vital signs: Reviewed and stable  Last Vitals:  Filed Vitals:   03/06/15 1404 03/06/15 1423  BP: 122/58 125/55  Pulse: 59 63  Temp: 37 C 37.1 C  Resp: 18 18    Complications: No apparent anesthesia complications

## 2015-03-06 NOTE — Progress Notes (Signed)
GI Note:  Hgb has dropped down to 6.3, now up to 6.7 on repeat,  so were are planning on colonoscopy +- pill camera.   I had long discussion with wife and patient about whether or not to give prbc prior to the procedure.  Sedation is slightly riskier with the hgb in the high 6's. However, they are very concerned about thrombosis risk from giving a blood tranfusion given his anti-phospholipd syndrome and hx of clots. .  I have proposed we do the colonoscopy with with minimal sedation to balance the two risks and they are in agreement.  If Hgb drops further, he will require blood transfusion regardless.    Also dicussed risks of colonoscopy and pill camera and they are in agreement with going ahead.

## 2015-03-06 NOTE — Progress Notes (Signed)
Telephone report called to Jesses in Endo.  Patient transported to Endo by Roselyn Reef, orderly.

## 2015-03-06 NOTE — Progress Notes (Signed)
ANTICOAGULATION CONSULT NOTE - Follow Up Consult  Pharmacy Consult for Heparin Indication: DVT, antiphospholipid syndrome  Allergies  Allergen Reactions  . Statins Nausea And Vomiting and Other (See Comments)    Reaction:  Muscle pain     Patient Measurements: Height: 5\' 7"  (170.2 cm) Weight: 142 lb 12.8 oz (64.774 kg) IBW/kg (Calculated) : 66.1 Heparin Dosing Weight: 64.8 kg  Vital Signs: Temp: 98.2 F (36.8 C) (01/27 0742) Temp Source: Oral (01/27 0742) BP: 133/58 mmHg (01/27 0742) Pulse Rate: 60 (01/27 0742)  Labs:  Recent Labs  03/04/15 0609 03/05/15 0533 03/06/15 0631  HGB 6.8* 6.3* 6.7*  HCT 21.3* 20.0* 20.7*  PLT 161 160 171  LABPROT 16.5* 17.9* 18.1*  INR 1.32 1.47 1.49  HEPARINUNFRC 0.50 0.55 0.54  CREATININE 3.17* 2.81* 2.77*    Estimated Creatinine Clearance: 24 mL/min (by C-G formula based on Cr of 2.77).   Medications:  Scheduled:  . amLODipine  10 mg Oral Q1200  . carvedilol  6.25 mg Oral BID WC  . citalopram  10 mg Oral QHS  . donepezil  10 mg Oral QHS  . losartan  50 mg Oral QHS  . montelukast  10 mg Oral QHS  . mycophenolate  500 mg Oral BID  . pantoprazole  40 mg Oral BID AC  . predniSONE  60 mg Oral Daily  . warfarin  5 mg Oral q1800  . Warfarin - Pharmacist Dosing Inpatient   Does not apply q1800   Infusions:  . sodium chloride 50 mL/hr at 03/06/15 0739  . heparin 1,050 Units/hr (03/06/15 0740)    Assessment: Patient restarted on Coumadin with Heparin bridge on 1/22.  1/23 HL=0.51 1/23 HL @ 1722 = 0.6 1/24 HL =0.48 1/25 HL = 0.50 1/26 HL = 0.55 1/27 HL = 0.54  Goal of Therapy:  Heparin level 0.3-0.7 units/ml Monitor platelets by anticoagulation protocol: Yes   Plan:  Will continue with current rate of 1050 units/hr and check next level with am labs.  Murrell Converse, PharmD Clinical Pharmacist 03/06/2015

## 2015-03-06 NOTE — Anesthesia Postprocedure Evaluation (Signed)
Anesthesia Post Note  Patient: Ocie Doyne  Procedure(s) Performed: Procedure(s) (LRB): COLONOSCOPY WITH PROPOFOL (N/A) ESOPHAGOGASTRODUODENOSCOPY (EGD) WITH PROPOFOL  Patient location during evaluation: Endoscopy Anesthesia Type: General Level of consciousness: awake and alert Pain management: pain level controlled Vital Signs Assessment: post-procedure vital signs reviewed and stable Respiratory status: spontaneous breathing, nonlabored ventilation, respiratory function stable and patient connected to nasal cannula oxygen Cardiovascular status: blood pressure returned to baseline and stable Postop Assessment: no signs of nausea or vomiting Anesthetic complications: no    Last Vitals:  Filed Vitals:   03/06/15 1627 03/06/15 1709  BP: 118/60 129/57  Pulse: 61 65  Temp:  36.6 C  Resp: 14     Last Pain:  Filed Vitals:   03/06/15 1709  PainSc: 0-No pain                 Anay Rathe S

## 2015-03-06 NOTE — Anesthesia Preprocedure Evaluation (Signed)
Anesthesia Evaluation  Patient identified by MRN, date of birth, ID band Patient awake    Reviewed: Allergy & Precautions, H&P , NPO status , [name] Chart, lab work & pertinent test results  History of Anesthesia Complications Negative for: history of anesthetic complications  Airway Mallampati: III  TM Distance: >3 FB Neck ROM: limited    Dental  (+) Poor Dentition, Chipped, Missing, Upper Dentures   Pulmonary neg shortness of breath, former smoker,    Pulmonary exam normal breath sounds clear to auscultation       Cardiovascular Exercise Tolerance: Poor hypertension, (-) angina+ DOE  (-) Past MI Normal cardiovascular exam Rhythm:regular Rate:Normal     Neuro/Psych PSYCHIATRIC DISORDERS TIACVA, Residual Symptoms    GI/Hepatic negative GI ROS, Neg liver ROS, neg GERD  ,  Endo/Other  negative endocrine ROS  Renal/GU negative Renal ROS  negative genitourinary   Musculoskeletal   Abdominal   Peds  Hematology negative hematology ROS (+) Blood dyscrasia, anemia ,   Anesthesia Other Findings Past Medical History:   Status post kidney transplant                   2001         Hyperparathyroidism ([place])                                    DVT (deep venous thrombosis) ([facility])                           TIA (transient ischemic attack)                              Essential hypertension                                       History of CMV                                               Pedal edema                                                  Rosacea                                                      Antiphospholipid antibody syndrome ([facility])                     Hyperlipidemia                                               Vascular dementia without behavioral disturban*             Past Surgical History:   ESOPHAGOGASTRODUODENOSCOPY  Left 03/01/2015      Comment:Procedure: ESOPHAGOGASTRODUODENOSCOPY  (EGD);                Surgeon: Hulen Luster, MD;  [facility]               ENDOSCOPY;  Service: Endoscopy;  Laterality:               Left;  BMI    Body Mass Index   22.36 kg/m 2      Reproductive/Obstetrics negative OB ROS                             Anesthesia Physical Anesthesia Plan  ASA: IV  Anesthesia Plan: General   Post-op Pain Management:    Induction:   Airway Management Planned:   Additional Equipment:   Intra-op Plan:   Post-operative Plan:   Informed Consent: I have reviewed the patients History and Physical, chart, labs and discussed the procedure including the risks, benefits and alternatives for the proposed anesthesia with the patient or authorized representative who has indicated his/her understanding and acceptance.   Dental Advisory Given  Plan Discussed with: Anesthesiologist, CRNA and Surgeon  Anesthesia Plan Comments: (Patient and dughter informed that patient is higher risk for complications from anesthesia during this procedure due to their medical history.  Patient and daughter voiced understanding. )        Anesthesia Quick Evaluation

## 2015-03-06 NOTE — Progress Notes (Addendum)
ANTICOAGULATION CONSULT NOTE - Follow Up Consult  Pharmacy Consult for Heparin Indication: DVT, antiphospholipid syndrome  Allergies  Allergen Reactions  . Statins Nausea And Vomiting and Other (See Comments)    Reaction:  Muscle pain     Patient Measurements: Height: 5\' 7"  (170.2 cm) Weight: 142 lb 12.8 oz (64.774 kg) IBW/kg (Calculated) : 66.1 Heparin Dosing Weight: 64.8 kg  Vital Signs: Temp: 97.9 F (36.6 C) (01/27 1709) Temp Source: Oral (01/27 1709) BP: 129/57 mmHg (01/27 1709) Pulse Rate: 65 (01/27 1709)  Labs:  Recent Labs  03/04/15 0609 03/05/15 0533 03/06/15 0631  HGB 6.8* 6.3* 6.7*  HCT 21.3* 20.0* 20.7*  PLT 161 160 171  LABPROT 16.5* 17.9* 18.1*  INR 1.32 1.47 1.49  HEPARINUNFRC 0.50 0.55 0.54  CREATININE 3.17* 2.81* 2.77*    Estimated Creatinine Clearance: 24 mL/min (by C-G formula based on Cr of 2.77).   Medications:  Scheduled:  . amLODipine  10 mg Oral Q1200  . carvedilol  6.25 mg Oral BID WC  . citalopram  10 mg Oral QHS  . donepezil  10 mg Oral QHS  . losartan  50 mg Oral QHS  . montelukast  10 mg Oral QHS  . mycophenolate  500 mg Oral BID  . pantoprazole  40 mg Oral BID AC  . predniSONE  60 mg Oral Daily  . sodium bicarbonate  650 mg Oral TID  . warfarin  5 mg Oral q1800  . Warfarin - Pharmacist Dosing Inpatient   Does not apply q1800   Infusions:  . heparin      Assessment: Patient restarted on Coumadin with Heparin bridge on 1/22.  1/23 HL=0.51 1/23 HL @ 1722 = 0.6 1/24 HL =0.48 1/25 HL = 0.50 1/26 HL = 0.55 1/27 HL = 0.54  Goal of Therapy:  Heparin level 0.3-0.7 units/ml Monitor platelets by anticoagulation protocol: Yes   Plan:  Will continue with current rate of 1050 units/hr and check next level with am labs.  1/27 :  Heparin gtt was stopped for surgery but restarted per MD order on 1/27 @ 17:30.  Will recheck HL 6 hrs after restart of drip on 1/28 @ 3:00.   Tipt[place] D Clinical  Pharmacist 03/06/2015

## 2015-03-06 NOTE — Progress Notes (Signed)
[facility]  ROUNDING NOTE   Subjective:   Wife at bedside.  Creatinine 2.77 (2.81) (3.17) Hgb 6.7 (6.3) (6.8)  Colonoscopy for later today  Objective:  Vital signs in last 24 hours:  Temp:  [97.9 F (36.6 C)-98.2 F (36.8 C)] 98.2 F (36.8 C) (01/27 0742) Pulse Rate:  [60-71] 60 (01/27 0742) Resp:  [18-20] 18 (01/27 0742) BP: (128-140)/(53-62) 133/58 mmHg (01/27 0742) SpO2:  [96 %-100 %] 98 % (01/27 0742)  Weight change:  Filed Weights   02/23/15 1558 02/23/15 1953  Weight: 65.772 kg (145 lb) 64.774 kg (142 lb 12.8 oz)    Intake/Output: I/O last 3 completed shifts: In: 2277.5 [P.O.:480; I.V.:1797.5] Out: -    Intake/Output this shift:     Physical Exam: General: NAD, laying in bed  Head: Normocephalic, atraumatic. Moist oral mucosal membranes  Eyes: Anicteric  Neck: Supple, trachea midline  Lungs:  Clear to auscultation, normal effort   Heart: Regular rate and rhythm  Abdomen:  Soft, nontender, BS present, right lower quadrant allograft kidney  Extremities: no peripheral edema.  Neurologic: Nonfocal, moving all four extremities  Skin: No lesions       Basic Metabolic Panel:  Recent Labs Lab 03/03/15 0552 03/04/15 0609 03/05/15 0533 03/06/15 0631  NA 138 137 142 135  K 3.8 4.1 4.2 4.2  CL 110 110 116* 112*  CO2 22 21* 20* 18*  GLUCOSE 92 87 100* 89  BUN 40* 35* 32* 26*  CREATININE 3.19* 3.17* 2.81* 2.77*  CALCIUM 8.7* 8.5* 8.9 8.6*  PHOS  --   --   --  3.0    Liver Function Tests:  Recent Labs Lab 03/06/15 0631  ALBUMIN 3.0*   No results for input(s): LIPASE, AMYLASE in the last 168 hours. No results for input(s): AMMONIA in the last 168 hours.  CBC:  Recent Labs Lab 03/02/15 0451 03/03/15 0552 03/04/15 0609 03/05/15 0533 03/06/15 0631  WBC 7.3 7.1 6.5 6.1 5.2  HGB 7.8* 7.4* 6.8* 6.3* 6.7*  HCT 23.9* 22.8* 21.3* 20.0* 20.7*  MCV 88.5 89.0 89.4 87.7 87.5  PLT 157 160 161 160 171    Cardiac Enzymes: No results  for input(s): CKTOTAL, CKMB, CKMBINDEX, TROPONINI in the last 168 hours.  BNP: Invalid input(s): POCBNP  CBG: No results for input(s): GLUCAP in the last 168 hours.  Microbiology: Results for orders placed or performed during the hospital encounter of 10/29/14  Urine culture     Status: None   Collection Time: 10/29/14  1:01 PM  Result Value Ref Range Status   Specimen Description URINE, RANDOM  Final   Special Requests NONE  Final   Culture 7,000 COLONIES/mL INSIGNIFICANT GROWTH  Final   Report Status 10/31/2014 FINAL  Final    Coagulation Studies:  Recent Labs  03/04/15 0609 03/05/15 0533 03/06/15 0631  LABPROT 16.5* 17.9* 18.1*  INR 1.32 1.47 1.49    Urinalysis: No results for input(s): COLORURINE, LABSPEC, PHURINE, GLUCOSEU, HGBUR, BILIRUBINUR, KETONESUR, PROTEINUR, UROBILINOGEN, NITRITE, LEUKOCYTESUR in the last 72 hours.  Invalid input(s): APPERANCEUR    Imaging: No results found.   Medications:     . amLODipine  10 mg Oral Q1200  . carvedilol  6.25 mg Oral BID WC  . citalopram  10 mg Oral QHS  . donepezil  10 mg Oral QHS  . losartan  50 mg Oral QHS  . montelukast  10 mg Oral QHS  . mycophenolate  500 mg Oral BID  . pantoprazole  40 mg  Oral BID AC  . predniSONE  60 mg Oral Daily  . sodium bicarbonate  650 mg Oral TID  . warfarin  5 mg Oral q1800  . Warfarin - Pharmacist Dosing Inpatient   Does not apply q1800   loperamide, ondansetron (ZOFRAN) IV  Assessment/ Plan:  65 y.o. 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   1. Acute renal failure on Chronic kidney disease stage III T  Status post renal transplantation in 2001. His baseline creatinine is 2.2 from December 2016. Creatinine trending down - most likely ATN from hypotension and anemia.  - now with metabolic acidosis: start sodium bicarbonate for buffering.  - Continue mycophenolate. Increase prednisone to 60mg  daily due to acute stressors- start to taper tomorrow - Hold tacrolimus.  - Monitor renal function, renally dose all medications.  - Losartan for renal protection  2. 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 Pt received blood transfusion this admission.  Ulcer in pylorus. Hemoglobin 6.7 Colonoscopy for later today  - appreciate GI input.   3. Hypertension.  Blood pressure at goal - continue coreg, losartan, and amlodipine.  4.  Secondary hyperparathyroidism. PTH 44 - Discontinued calcitriol    LOS: Coward, Geovanna Simko 1/27/201712:24 PM

## 2015-03-06 NOTE — Progress Notes (Signed)
ANTICOAGULATION CONSULT NOTE - Follow Up Consult  Pharmacy Consult for Coumadin Indication: Antiphospholipid symdrome, DVT  Allergies  Allergen Reactions  . Statins Nausea And Vomiting and Other (See Comments)    Reaction:  Muscle pain     Patient Measurements: Height: 5\' 7"  (170.2 cm) Weight: 142 lb 12.8 oz (64.774 kg) IBW/kg (Calculated) : 66.1 Heparin Dosing Weight: 64.8 kg  Vital Signs: Temp: 98.2 F (36.8 C) (01/27 0742) Temp Source: Oral (01/27 0742) BP: 133/58 mmHg (01/27 0742) Pulse Rate: 60 (01/27 0742)  Labs:  Recent Labs  03/04/15 0609 03/05/15 0533 03/06/15 0631  HGB 6.8* 6.3* 6.7*  HCT 21.3* 20.0* 20.7*  PLT 161 160 171  LABPROT 16.5* 17.9* 18.1*  INR 1.32 1.47 1.49  HEPARINUNFRC 0.50 0.55 0.54  CREATININE 3.17* 2.81* 2.77*    Estimated Creatinine Clearance: 24 mL/min (by C-G formula based on Cr of 2.77).   Medications:  Scheduled:  . amLODipine  10 mg Oral Q1200  . carvedilol  6.25 mg Oral BID WC  . citalopram  10 mg Oral QHS  . donepezil  10 mg Oral QHS  . losartan  50 mg Oral QHS  . montelukast  10 mg Oral QHS  . mycophenolate  500 mg Oral BID  . pantoprazole  40 mg Oral BID AC  . predniSONE  60 mg Oral Daily  . warfarin  5 mg Oral q1800  . Warfarin - Pharmacist Dosing Inpatient   Does not apply q1800   Infusions:  . sodium chloride 50 mL/hr at 03/06/15 0739  . heparin 1,050 Units/hr (03/06/15 0740)    Assessment: Patient restarted on Coumadin with Heparin bridge on 1/22.   1/22 INR = 1.47 1/23 INR=1.31 1/24 INR = 1.24 1/25 INR = 1.32 1/26 INR = 1.47 1/27 INR = 1.49  Goal of Therapy:  INR 2-3 Monitor platelets by anticoagulation protocol: Yes   Plan:  Patient did not receive warfarin 4 mg dose on 1/26.  Per [facility] note, dose was contraindicated.  Will go ahead and increase warfarin dose to 5 mg for today and reassess INR in AM as INR has remained subtherapeutic.  Follow INR/CBC closely as patient with GI bleed and PTA dosing  listed as 2 mg on Monday and Friday with 3 mg all other days.  Patient admitted with therapeutic INR.   Pharmacy will continue to follow.  Murrell Converse, PharmD Clinical Pharmacist 03/06/2015

## 2015-03-06 NOTE — Interval H&P Note (Signed)
History and Physical Interval Note:  03/06/2015 3:05 PM  Eric Gill  has presented today for surgery, with the diagnosis of anemia  The various methods of treatment have been discussed with the patient and family. After consideration of risks, benefits and other options for treatment, the patient has consented to  Procedure(s): COLONOSCOPY WITH PROPOFOL (N/A) , possibel EGD and pill camrea placement as a surgical intervention .  The patient's history has been reviewed, patient examined, no change in status, stable for surgery.  I have reviewed the patient's chart and labs.  Questions were answered to the patient's satisfaction.     [name]

## 2015-03-07 ENCOUNTER — Inpatient Hospital Stay: Payer: Medicare Other

## 2015-03-07 ENCOUNTER — Encounter: Payer: Self-pay | Admitting: Gastroenterology

## 2015-03-07 LAB — PROTIME-INR
INR: 1.56
Prothrombin Time: 18.7 seconds — ABNORMAL HIGH (ref 11.4–15.0)

## 2015-03-07 LAB — CBC
HEMATOCRIT: 20.1 % — AB (ref 40.0–52.0)
Hemoglobin: 6.5 g/dL — ABNORMAL LOW (ref 13.0–18.0)
MCH: 28.2 pg (ref 26.0–34.0)
MCHC: 32.4 g/dL (ref 32.0–36.0)
MCV: 87.1 fL (ref 80.0–100.0)
Platelets: 170 10*3/uL (ref 150–440)
RBC: 2.31 MIL/uL — ABNORMAL LOW (ref 4.40–5.90)
RDW: 14 % (ref 11.5–14.5)
WBC: 5.4 10*3/uL (ref 3.8–10.6)

## 2015-03-07 LAB — RENAL FUNCTION PANEL
ANION GAP: 6 (ref 5–15)
Albumin: 2.9 g/dL — ABNORMAL LOW (ref 3.5–5.0)
BUN: 36 mg/dL — ABNORMAL HIGH (ref 6–20)
CALCIUM: 8.9 mg/dL (ref 8.9–10.3)
CHLORIDE: 114 mmol/L — AB (ref 101–111)
CO2: 19 mmol/L — AB (ref 22–32)
Creatinine, Ser: 3.27 mg/dL — ABNORMAL HIGH (ref 0.61–1.24)
GFR calc non Af Amer: 18 mL/min — ABNORMAL LOW (ref >60)
GFR, EST AFRICAN AMERICAN: 21 mL/min — AB (ref >60)
Glucose, Bld: 167 mg/dL — ABNORMAL HIGH (ref 65–99)
Phosphorus: 3.3 mg/dL (ref 2.5–4.6)
Potassium: 5 mmol/L (ref 3.5–5.1)
SODIUM: 139 mmol/L (ref 135–145)

## 2015-03-07 LAB — HEPARIN LEVEL (UNFRACTIONATED): Heparin Unfractionated: 0.38 IU/mL (ref 0.30–0.70)

## 2015-03-07 MED ORDER — SUCRALFATE 1 GM/10ML PO SUSP: 1.0000 g | Freq: Four times a day (QID) | ORAL | Status: DC

## 2015-03-07 MED ORDER — ALPRAZOLAM 0.5 MG PO TABS
0.5000 mg | ORAL_TABLET | Freq: Once | ORAL | Status: AC
  Administered 2015-03-07: 0.5 mg via ORAL
  Filled 2015-03-07: qty 1

## 2015-03-07 NOTE — Progress Notes (Signed)
ANTICOAGULATION CONSULT NOTE - Follow Up Consult  Pharmacy Consult for Heparin Indication: DVT, antiphospholipid syndrome  Allergies  Allergen Reactions  . Statins Nausea And Vomiting and Other (See Comments)    Reaction:  Muscle pain     Patient Measurements: Height: 5\' 7"  (170.2 cm) Weight: 142 lb 12.8 oz (64.774 kg) IBW/kg (Calculated) : 66.1 Heparin Dosing Weight: 64.8 kg  Vital Signs: Temp: 97.9 F (36.6 C) (01/27 2200) Temp Source: Oral (01/27 2200) BP: 128/54 mmHg (01/27 2200) Pulse Rate: 64 (01/27 2200)  Labs:  Recent Labs  03/04/15 0609 03/05/15 0533 03/06/15 0631 03/07/15 0304  HGB 6.8* 6.3* 6.7* 6.5*  HCT 21.3* 20.0* 20.7* 20.1*  PLT 161 160 171 170  LABPROT 16.5* 17.9* 18.1* 18.7*  INR 1.32 1.47 1.49 1.56  HEPARINUNFRC 0.50 0.55 0.54 0.38  CREATININE 3.17* 2.81* 2.77*  --     Estimated Creatinine Clearance: 24 mL/min (by C-G formula based on Cr of 2.77).   Medications:  Scheduled:  . amLODipine  10 mg Oral Q1200  . carvedilol  6.25 mg Oral BID WC  . citalopram  10 mg Oral QHS  . donepezil  10 mg Oral QHS  . losartan  50 mg Oral QHS  . montelukast  10 mg Oral QHS  . mycophenolate  500 mg Oral BID  . pantoprazole  40 mg Oral BID AC  . predniSONE  60 mg Oral Daily  . sodium bicarbonate  650 mg Oral TID  . sucralfate  1 g Oral 4 times per day  . warfarin  5 mg Oral q1800  . Warfarin - Pharmacist Dosing Inpatient   Does not apply q1800   Infusions:  . heparin 1,050 Units/hr (03/06/15 1915)    Assessment: Patient restarted on Coumadin with Heparin bridge on 1/22.  1/23 HL=0.51 1/23 HL @ 1722 = 0.6 1/24 HL =0.48 1/25 HL = 0.50 1/26 HL = 0.55 1/27 HL = 0.54 1/28 HL = 0.38  Goal of Therapy:  Heparin level 0.3-0.7 units/ml Monitor platelets by anticoagulation protocol: Yes   Plan:  Will continue with current rate of 1050 units/hr and check next level with am labs.  Murrell Converse, PharmD Clinical Pharmacist 03/07/2015

## 2015-03-07 NOTE — Plan of Care (Signed)
Pt returned to room and he was hooked back up to heparin drip.

## 2015-03-07 NOTE — Discharge Summary (Addendum)
Eric Gill, is a 64 y.o. male  DOB 1950/10/08  MRN [id number].  Admission date:  02/23/2015  Admitting Physician  Vaughan Basta, MD  Discharge Date:  03/07/2015   Primary MD  Idelle Crouch, MD  Recommendations for primary care physician for things to follow:   Will  be transferred to [facility] for worsening renal failure and possible transplant rejection.   Admission Diagnosis  Gastrointestinal hemorrhage with melena [K92.1] Anemia, unspecified anemia type [D64.9]   Discharge Diagnosis  Gastrointestinal hemorrhage with melena [K92.1] Anemia, unspecified anemia type [D64.9]    Principal Problem:   GI bleed Active Problems:   Acute blood loss anemia      Past Medical History  Diagnosis Date  . Status post kidney transplant 2001  . Hyperparathyroidism ([place])   . DVT (deep venous thrombosis) ([place])   . TIA (transient ischemic attack)   . Essential hypertension   . History of CMV   . Pedal edema   . Rosacea   . Antiphospholipid antibody syndrome ([place])   . Hyperlipidemia   . Vascular dementia without behavioral disturbance     Past Surgical History  Procedure Laterality Date  . Esophagogastroduodenoscopy Left 03/01/2015    Procedure: ESOPHAGOGASTRODUODENOSCOPY (EGD);  Surgeon: Hulen Luster, MD;  Location: [facility] ENDOSCOPY;  Service: Endoscopy;  Laterality: Left;       History of present illness and  Hospital Course:     Kindly see H&P for history of present illness and admission details, please review complete Labs, Consult reports and Test reports for all details in brief  HPI  from the history and physical done on the day of admission 65 year old male patient with history of kidney transplant on immunosuppressants, hyperparathyroidism, antiphospholipid antibody syndrome, history of DVT, TIA admitted  because of coffee-ground vomiting, acute blood loss anemia with hemoglobin 7.3 on admission. He is admitted for that.   Hospital Course  #1 acute blood loss anemia likely secondary to upright upper GI bleed: Patient had tarry stools, coffee-ground vomiting. Started on IV PPIs, stopped the Coumadin that he was taking. Obtained a GI consult, patient followed by Dr. Candace Cruise from gastroenterology, because the INR was 2.9 on admission.EGD was not done immediaately. Patient had an endoscopy after the INR dropped to 2. He had to bridging with heparin because of his antiphospholipid antibody syndrome. Patient had EGD done on January 22 which showed antritis, polyp in the fundus, pyloric channel ulcer with clean base. Patient started on IV PPI twice a day, monitor the hemoglobin Closely. Patient received 2 units of packed RBC. Hemoglobin stayed around 7.6. However it continued to drop 6.3,be cause of persistent anemia, patient was taken to colonoscopy, patient colonoscopy showed some diverticula, internal hemorrhoids. Dr. Thurmond Butts also did capsule endoscopy as well. The results are pending. Advised the patient to continue PPI IV twice a day for 8 weeks, started on Carafate solution every 6 hours also. We resume the heparin drip, continued on Coumadin also. Because of worsening renal failure and anemia Dr. Juleen China  from nephrology recommended transfer to [facility] where he had transplant doctors and nephrology.  2. Acute on chronic renal failure, kidney disease stage III: History of for renal transplant in 2001. Baseline creatinine 2.2. Patient creatinine went up to 3.27 gradually. Seen by nephrology, started on IV hydration. Patient tacrolimus was stopped and ordered the tacrolimus levels. Prednisone is increased to 60 mg for acute stress .[name] Continue mycophenolate for transplant. Ordered renal  ultrasound also. Because of worsening renal failure, anemia,  borderline platelets, discussed with nephrology, patient will benefit from  transfer to [facility]. Started the transfer process. #3 anemia of chronic kidney disease stage III ;with GI bleed: Received 2 units of packed RBC.,  #4 history of TIA, vascular dementia: Follows up with  Neurology . #5 antiphospholipid antibody syndrome: Patient right now getting heparin drip, Coumadin. #6 hypertension: Controlled continue amlodipine, Coreg, Cozaar.    Discharge Condition: stable   Follow UP      Discharge Instructions  and  Discharge Medications        Medication List    STOP taking these medications        furosemide 20 MG tablet  Commonly known as:  LASIX     omeprazole 20 MG capsule  Commonly known as:  PRILOSEC      TAKE these medications        amLODipine 5 MG tablet  Commonly known as:  NORVASC  Take 5 mg by mouth daily at 12 noon.     calcitRIOL 0.25 MCG capsule  Commonly known as:  ROCALTROL  Take 0.25 mcg by mouth at bedtime.     CELLCEPT 500 MG tablet  Generic drug:  mycophenolate  Take 500 mg by mouth 2 (two) times daily.     citalopram 10 MG tablet  Commonly known as:  CELEXA  Take 10 mg by mouth at bedtime.     COUMADIN 1 MG tablet  Generic drug:  warfarin  Take 2-3 mg by mouth at bedtime. Pt takes 2mg  on Monday and Friday.   Pt takes 3mg  all other days.   Pt is only able to use brand name.     donepezil 10 MG tablet  Commonly known as:  ARICEPT  Take 10 mg by mouth at bedtime.     enoxaparin 30 MG/0.3ML injection  Commonly known as:  LOVENOX  Inject 0.65 mLs (65 mg total) into the skin daily.     labetalol 200 MG tablet  Commonly known as:  NORMODYNE  Take 200 mg by mouth 2 (two) times daily.     losartan 50 MG tablet  Commonly known as:  COZAAR  Take 50 mg by mouth at bedtime.     montelukast 10 MG tablet  Commonly known as:  SINGULAIR  Take 10 mg by mouth at bedtime.     pantoprazole 40 MG tablet  Commonly known as:  PROTONIX  Take 1 tablet (40 mg total) by mouth 2 (two) times daily before a meal.      predniSONE 5 MG tablet  Commonly known as:  DELTASONE  Take 5 mg by mouth daily.     PROGRAF 0.5 MG capsule  Generic drug:  tacrolimus  Take 0.5 mg by mouth at bedtime.     sucralfate 1 GM/10ML suspension  Commonly known as:  CARAFATE  Take 10 mLs (1 g total) by mouth every 6 (six) hours.          Diet and Activity recommendation: See Discharge Instructions above   Consults obtained - nephrology Gastroenterology   Major procedures and Radiology Reports - PLEASE review detailed and final reports for all details, in brief -  EGD, colonoscopy, capsule endoscopy    No results found.  Micro Results    No results found for this or any previous visit (from the past 240 hour(s)).     Today   Subjective:   Eric Gill today has no headache,no chest abdominal pain,no new weakness tingling or numbness, stable for transfer to  [facility] when the bed is available.  Objective:   Blood pressure 135/57, pulse 62, temperature 97.9 F (36.6 C), temperature source Oral, resp. rate 18, height 5\' 7"  (1.702 m), weight 64.774 kg (142 lb 12.8 oz), SpO2 98 %.   Intake/Output Summary (Last 24 hours) at 03/07/15 1157 Last data filed at 03/07/15 0909  Gross per 24 hour  Intake 126.65 ml  Output      0 ml  Net 126.65 ml    Exam Awake Alert, Oriented x 3, No new F.N deficits, Normal affect [place].AT,PERRAL Supple Neck,No JVD, No cervical lymphadenopathy appriciated.  Symmetrical Chest wall movement, Good air movement bilaterally, CTAB RRR,No Gallops,Rubs or new Murmurs, No Parasternal Heave +ve B.Sounds, Abd Soft, Non tender, No organomegaly appriciated, No rebound -guarding or rigidity. No Cyanosis, Clubbing or edema, No new Rash or bruise  Data Review   CBC w Diff: Lab Results  Component Value Date   WBC 5.4 03/07/2015   HGB 6.5* 03/07/2015   HCT 20.1* 03/07/2015   PLT 170 03/07/2015   LYMPHOPCT 15 10/29/2014   MONOPCT 9 10/29/2014   EOSPCT 3  10/29/2014   BASOPCT 1 10/29/2014    CMP: Lab Results  Component Value Date   NA 139 03/07/2015   K 5.0 03/07/2015   CL 114* 03/07/2015   CO2 19* 03/07/2015   BUN 36* 03/07/2015   CREATININE 3.27* 03/07/2015   PROT 5.4* 02/23/2015   ALBUMIN 2.9* 03/07/2015   BILITOT 0.6 02/23/2015   ALKPHOS 64 02/23/2015   AST 19 02/23/2015   ALT 13* 02/23/2015  . Patient will be transferred to [facility]. I spoke with Dr. Reyes Ivan from hospitalist who accepted the patient.  Total Time in preparing paper work, data evaluation and todays exam - 5 minutes  Kashia Brossard M.D on 03/07/2015 at 11:57 AM    Note: This dictation was prepared with Dragon dictation along with smaller phrase technology. Any transcriptional errors that result from this process are unintentional.

## 2015-03-07 NOTE — Progress Notes (Signed)
ANTICOAGULATION CONSULT NOTE - Follow Up Consult  Pharmacy Consult for Coumadin Indication: Antiphospholipid symdrome, DVT  Allergies  Allergen Reactions  . Statins Nausea And Vomiting and Other (See Comments)    Reaction:  Muscle pain     Patient Measurements: Height: 5\' 7"  (170.2 cm) Weight: 142 lb 12.8 oz (64.774 kg) IBW/kg (Calculated) : 66.1 Heparin Dosing Weight: 64.8 kg  Vital Signs: Temp: 97.9 F (36.6 C) (01/27 2200) Temp Source: Oral (01/27 2200) BP: 128/54 mmHg (01/27 2200) Pulse Rate: 64 (01/27 2200)  Labs:  Recent Labs  03/04/15 0609 03/05/15 0533 03/06/15 0631 03/07/15 0304  HGB 6.8* 6.3* 6.7* 6.5*  HCT 21.3* 20.0* 20.7* 20.1*  PLT 161 160 171 170  LABPROT 16.5* 17.9* 18.1* 18.7*  INR 1.32 1.47 1.49 1.56  HEPARINUNFRC 0.50 0.55 0.54 0.38  CREATININE 3.17* 2.81* 2.77*  --     Estimated Creatinine Clearance: 24 mL/min (by C-G formula based on Cr of 2.77).   Medications:  Scheduled:  . amLODipine  10 mg Oral Q1200  . carvedilol  6.25 mg Oral BID WC  . citalopram  10 mg Oral QHS  . donepezil  10 mg Oral QHS  . losartan  50 mg Oral QHS  . montelukast  10 mg Oral QHS  . mycophenolate  500 mg Oral BID  . pantoprazole  40 mg Oral BID AC  . predniSONE  60 mg Oral Daily  . sodium bicarbonate  650 mg Oral TID  . sucralfate  1 g Oral 4 times per day  . warfarin  5 mg Oral q1800  . Warfarin - Pharmacist Dosing Inpatient   Does not apply q1800   Infusions:  . heparin 1,050 Units/hr (03/06/15 1915)    Assessment: Patient restarted on Coumadin with Heparin bridge on 1/22.   1/22 INR = 1.47 1/23 INR=1.31 1/24 INR = 1.24 1/25 INR = 1.32 1/26 INR = 1.47 1/27 INR = 1.49 1/28 INR = 1.56  Goal of Therapy:  INR 2-3 Monitor platelets by anticoagulation protocol: Yes   Plan:  Patient did not receive warfarin 4 mg dose on 1/26.  Per [facility] note, dose was contraindicated.  Will go ahead and increase warfarin dose to 5 mg for today and reassess INR in AM  as INR has remained subtherapeutic.  Follow INR/CBC closely as patient with GI bleed and PTA dosing listed as 2 mg on Monday and Friday with 3 mg all other days.  Patient admitted with therapeutic INR.   1/28 AM Continue warfarin 5 mg daily and f/u INR in AM.  Pharmacy will continue to follow.  Murrell Converse, PharmD Clinical Pharmacist 03/07/2015

## 2015-03-07 NOTE — Plan of Care (Signed)
Called Dr. Vianne Bulls and pharmacy to ask if it would be ok for pt to be unhooked from heparin drip for no more than 30 minutes.  They sd it would be ok for no more than that. Pt is dealing w/disappointment of being transferred to [facility] instead of going home.  Emphasized to pt and daughter absolutely not more than 30 minutes.  Pt was unhooked at this time. Will chart again when we hook him back up.

## 2015-03-07 NOTE — Progress Notes (Signed)
[facility]  ROUNDING NOTE   Subjective:   Wife at bedside.  Creatinine worse. Patient states he is feeling better. However hemoglobin continues to be low.   Objective:  Vital signs in last 24 hours:  Temp:  [96.9 F (36.1 C)-98.8 F (37.1 C)] 97.9 F (36.6 C) (01/28 0622) Pulse Rate:  [59-70] 62 (01/28 0902) Resp:  [14-23] 18 (01/28 0622) BP: (118-135)/(54-67) 135/57 mmHg (01/28 0902) SpO2:  [94 %-99 %] 98 % (01/28 0622)  Weight change:  Filed Weights   02/23/15 1558 02/23/15 1953  Weight: 65.772 kg (145 lb) 64.774 kg (142 lb 12.8 oz)    Intake/Output: I/O last 3 completed shifts: In: 2588 [I.V.:2588] Out: -    Intake/Output this shift:  Total I/O In: 120 [P.O.:120] Out: -   Physical Exam: General: NAD, laying in bed  Head: Normocephalic, atraumatic. Moist oral mucosal membranes  Eyes: Anicteric  Neck: Supple, trachea midline  Lungs:  Clear to auscultation, normal effort   Heart: Regular rate and rhythm  Abdomen:  Soft, nontender, BS present, right lower quadrant allograft kidney  Extremities: no peripheral edema.  Neurologic: Nonfocal, moving all four extremities  Skin: No lesions       Basic Metabolic Panel:  Recent Labs Lab 03/03/15 0552 03/04/15 0609 03/05/15 0533 03/06/15 0631 03/07/15 0304  NA 138 137 142 135 139  K 3.8 4.1 4.2 4.2 5.0  CL 110 110 116* 112* 114*  CO2 22 21* 20* 18* 19*  GLUCOSE 92 87 100* 89 167*  BUN 40* 35* 32* 26* 36*  CREATININE 3.19* 3.17* 2.81* 2.77* 3.27*  CALCIUM 8.7* 8.5* 8.9 8.6* 8.9  PHOS  --   --   --  3.0 3.3    Liver Function Tests:  Recent Labs Lab 03/06/15 0631 03/07/15 0304  ALBUMIN 3.0* 2.9*   No results for input(s): LIPASE, AMYLASE in the last 168 hours. No results for input(s): AMMONIA in the last 168 hours.  CBC:  Recent Labs Lab 03/03/15 0552 03/04/15 0609 03/05/15 0533 03/06/15 0631 03/07/15 0304  WBC 7.1 6.5 6.1 5.2 5.4  HGB 7.4* 6.8* 6.3* 6.7* 6.5*  HCT 22.8*  21.3* 20.0* 20.7* 20.1*  MCV 89.0 89.4 87.7 87.5 87.1  PLT 160 161 160 171 170    Cardiac Enzymes: No results for input(s): CKTOTAL, CKMB, CKMBINDEX, TROPONINI in the last 168 hours.  BNP: Invalid input(s): POCBNP  CBG: No results for input(s): GLUCAP in the last 168 hours.  Microbiology: Results for orders placed or performed during the hospital encounter of 10/29/14  Urine culture     Status: None   Collection Time: 10/29/14  1:01 PM  Result Value Ref Range Status   Specimen Description URINE, RANDOM  Final   Special Requests NONE  Final   Culture 7,000 COLONIES/mL INSIGNIFICANT GROWTH  Final   Report Status 10/31/2014 FINAL  Final    Coagulation Studies:  Recent Labs  03/05/15 0533 03/06/15 0631 03/07/15 0304  LABPROT 17.9* 18.1* 18.7*  INR 1.47 1.49 1.56    Urinalysis: No results for input(s): COLORURINE, LABSPEC, PHURINE, GLUCOSEU, HGBUR, BILIRUBINUR, KETONESUR, PROTEINUR, UROBILINOGEN, NITRITE, LEUKOCYTESUR in the last 72 hours.  Invalid input(s): APPERANCEUR    Imaging: No results found.   Medications:   . heparin 1,050 Units/hr (03/06/15 1915)   . amLODipine  10 mg Oral Q1200  . carvedilol  6.25 mg Oral BID WC  . citalopram  10 mg Oral QHS  . donepezil  10 mg Oral QHS  . losartan  50 mg Oral QHS  . montelukast  10 mg Oral QHS  . mycophenolate  500 mg Oral BID  . pantoprazole  40 mg Oral BID AC  . predniSONE  60 mg Oral Daily  . sodium bicarbonate  650 mg Oral TID  . sucralfate  1 g Oral 4 times per day  . warfarin  5 mg Oral q1800  . Warfarin - Pharmacist Dosing Inpatient   Does not apply q1800   loperamide, ondansetron (ZOFRAN) IV  Assessment/ Plan:  65 y.o. 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   1. Acute renal failure on Chronic kidney disease stage III T Status post renal transplantation in 2001. His baseline creatinine is 2.2 from December 2016. Creatinine now trending back up. With metabolic acidosis - Check renal ultrasound of transplant kidney - Continue sodium bicarbonate for buffering.  - Continue mycophenolate.  - Increase prednisone to 60mg  daily due to acute stressors - Hold tacrolimus. Tacrolimus level pending.  - Monitor renal function, renally dose all medications.  - Hold losartan.   2. 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 Pt received blood transfusion this admission.  Ulcer in pylorus. Hemoglobin 6.5 - appreciate GI input. I have discussed case with Dr. Rayann Heman.   3. Hypertension.  Blood pressure at goal - continue coreg, and amlodipine. Holding losartan.   4.  Secondary hyperparathyroidism. PTH 44 - Discontinued calcitriol   Patient will benefit from going to [facility] where his primary nephrologist is. I have spoken to Dr. Alger Simons earlier this week. I will attempt to call the nephrologist on call at [facility].    LOS: [place], [place] 1/28/201711:26 AM

## 2015-03-07 NOTE — Plan of Care (Signed)
Pt will be transferred to [facility] where he had his transplant b/c of worsening renal failure.  BUN and creatinine continue to increase: 36 and 3.27 respectively.  Hgb decreased from 6.7 to 6.5 today.  GFR 21%.  Pt has no c/o pain -states he's feeling better and wants to go home.  Continues to be on heparin drip at 10.5 ml/hr. Pt had colonoscopy yesterday and upper endoscopy.  Results included diverticulitis and internal/external hemorrhoids, gastric ulcer and gastritis.  Did capsule study w/GI - no results yet.  Had renal U/S today.  Haven't heard back from [facility] transfer team.

## 2015-03-08 LAB — RENAL FUNCTION PANEL
ANION GAP: 6 (ref 5–15)
Albumin: 3.3 g/dL — ABNORMAL LOW (ref 3.5–5.0)
BUN: 43 mg/dL — ABNORMAL HIGH (ref 6–20)
CALCIUM: 9.2 mg/dL (ref 8.9–10.3)
CO2: 21 mmol/L — AB (ref 22–32)
CREATININE: 2.89 mg/dL — AB (ref 0.61–1.24)
Chloride: 114 mmol/L — ABNORMAL HIGH (ref 101–111)
GFR, EST AFRICAN AMERICAN: 25 mL/min — AB (ref >60)
GFR, EST NON AFRICAN AMERICAN: 21 mL/min — AB (ref >60)
Glucose, Bld: 128 mg/dL — ABNORMAL HIGH (ref 65–99)
PHOSPHORUS: 3.5 mg/dL (ref 2.5–4.6)
Potassium: 4.2 mmol/L (ref 3.5–5.1)
SODIUM: 141 mmol/L (ref 135–145)

## 2015-03-08 LAB — CBC
HCT: 21.3 % — ABNORMAL LOW (ref 40.0–52.0)
HEMOGLOBIN: 6.9 g/dL — AB (ref 13.0–18.0)
MCH: 28.1 pg (ref 26.0–34.0)
MCHC: 32.4 g/dL (ref 32.0–36.0)
MCV: 86.8 fL (ref 80.0–100.0)
PLATELETS: 202 10*3/uL (ref 150–440)
RBC: 2.46 MIL/uL — AB (ref 4.40–5.90)
RDW: 14 % (ref 11.5–14.5)
WBC: 8.9 10*3/uL (ref 3.8–10.6)

## 2015-03-08 LAB — PROTIME-INR
INR: 2.05
Prothrombin Time: 23 seconds — ABNORMAL HIGH (ref 11.4–15.0)

## 2015-03-08 LAB — HEPARIN LEVEL (UNFRACTIONATED): Heparin Unfractionated: 0.68 IU/mL (ref 0.30–0.70)

## 2015-03-08 MED ORDER — SODIUM BICARBONATE 650 MG PO TABS: 650.0000 mg | ORAL_TABLET | Freq: Three times a day (TID) | ORAL | Status: DC

## 2015-03-08 MED ORDER — WARFARIN SODIUM 3 MG PO TABS: 3.0000 mg | ORAL_TABLET | Freq: Every day | ORAL | Status: DC

## 2015-03-08 MED ORDER — ENOXAPARIN (LOVENOX) PATIENT EDUCATION KIT
PACK | Freq: Once | Status: DC
  Filled 2015-03-08: qty 1

## 2015-03-08 MED ORDER — WARFARIN SODIUM 1 MG PO TABS
3.0000 mg | ORAL_TABLET | Freq: Every day | ORAL | Status: DC
  Administered 2015-03-08: 15:00:00 3 mg via ORAL
  Filled 2015-03-08: qty 1

## 2015-03-08 MED ORDER — PREDNISONE 20 MG PO TABS
20.0000 mg | ORAL_TABLET | Freq: Every day | ORAL | Status: DC
Start: 2015-03-08 — End: 2018-08-15

## 2015-03-08 NOTE — Progress Notes (Signed)
Telephone call to [facility] spoke with Claiborne Billings to advise her that the pt was being discharged home today and would not need the transfer bed at [facility]; pt to follow-up out-pt at [facility] with hematologist and transplant MDs

## 2015-03-08 NOTE — Progress Notes (Signed)
ANTICOAGULATION CONSULT NOTE - Follow Up Consult  Pharmacy Consult for Heparin Indication: DVT, antiphospholipid syndrome  Allergies  Allergen Reactions  . Statins Nausea And Vomiting and Other (See Comments)    Reaction:  Muscle pain     Patient Measurements: Height: 5\' 7"  (170.2 cm) Weight: 142 lb 12.8 oz (64.774 kg) IBW/kg (Calculated) : 66.1 Heparin Dosing Weight: 64.8 kg  Vital Signs: Temp: 97.7 F (36.5 C) (01/29 0457) Temp Source: Oral (01/29 0457) BP: 126/64 mmHg (01/29 0457) Pulse Rate: 61 (01/29 0457)  Labs:  Recent Labs  03/06/15 0631 03/07/15 0304 03/08/15 0555  HGB 6.7* 6.5* 6.9*  HCT 20.7* 20.1* 21.3*  PLT 171 170 202  LABPROT 18.1* 18.7* 23.0*  INR 1.49 1.56 2.05  HEPARINUNFRC 0.54 0.38 0.68  CREATININE 2.77* 3.27* 2.89*    Estimated Creatinine Clearance: 23 mL/min (by C-G formula based on Cr of 2.89).   Medications:  Scheduled:  . amLODipine  10 mg Oral Q1200  . carvedilol  6.25 mg Oral BID WC  . citalopram  10 mg Oral QHS  . donepezil  10 mg Oral QHS  . montelukast  10 mg Oral QHS  . mycophenolate  500 mg Oral BID  . pantoprazole  40 mg Oral BID AC  . predniSONE  60 mg Oral Daily  . sodium bicarbonate  650 mg Oral TID  . sucralfate  1 g Oral 4 times per day  . warfarin  5 mg Oral q1800  . Warfarin - Pharmacist Dosing Inpatient   Does not apply q1800   Infusions:  . heparin 1,050 Units/hr (03/07/15 1719)    Assessment: Patient restarted on Coumadin with Heparin bridge on 1/22.  1/23 HL=0.51 1/23 HL @ 1722 = 0.6 1/24 HL =0.48 1/25 HL = 0.50 1/26 HL = 0.55 1/27 HL = 0.54 1/28 HL = 0.38 1/29 HL = 0.68  Goal of Therapy:  Heparin level 0.3-0.7 units/ml Monitor platelets by anticoagulation protocol: Yes   Plan:  Will continue with current rate of 1050 units/hr and check next level with am labs.  Murrell Converse, PharmD Clinical Pharmacist 03/08/2015

## 2015-03-08 NOTE — Progress Notes (Signed)
ANTICOAGULATION CONSULT NOTE - Follow Up Consult  Pharmacy Consult for Coumadin Indication: Antiphospholipid symdrome, DVT  Allergies  Allergen Reactions  . Statins Nausea And Vomiting and Other (See Comments)    Reaction:  Muscle pain     Patient Measurements: Height: 5\' 7"  (170.2 cm) Weight: 142 lb 12.8 oz (64.774 kg) IBW/kg (Calculated) : 66.1 Heparin Dosing Weight: 64.8 kg  Vital Signs: Temp: 97.7 F (36.5 C) (01/29 0457) Temp Source: Oral (01/29 0457) BP: 126/64 mmHg (01/29 0457) Pulse Rate: 61 (01/29 0457)  Labs:  Recent Labs  03/06/15 0631 03/07/15 0304 03/08/15 0555  HGB 6.7* 6.5* 6.9*  HCT 20.7* 20.1* 21.3*  PLT 171 170 202  LABPROT 18.1* 18.7* 23.0*  INR 1.49 1.56 2.05  HEPARINUNFRC 0.54 0.38 0.68  CREATININE 2.77* 3.27* 2.89*    Estimated Creatinine Clearance: 23 mL/min (by C-G formula based on Cr of 2.89).   Medications:  Scheduled:  . amLODipine  10 mg Oral Q1200  . carvedilol  6.25 mg Oral BID WC  . citalopram  10 mg Oral QHS  . donepezil  10 mg Oral QHS  . montelukast  10 mg Oral QHS  . mycophenolate  500 mg Oral BID  . pantoprazole  40 mg Oral BID AC  . predniSONE  60 mg Oral Daily  . sodium bicarbonate  650 mg Oral TID  . sucralfate  1 g Oral 4 times per day  . warfarin  5 mg Oral q1800  . Warfarin - Pharmacist Dosing Inpatient   Does not apply q1800   Infusions:  . heparin 1,050 Units/hr (03/07/15 1719)    Assessment: Patient restarted on Coumadin with Heparin bridge on 1/22.   1/22 INR = 1.47 1/23 INR=1.31 1/24 INR = 1.24 1/25 INR = 1.32 1/26 INR = 1.47 1/27 INR = 1.49 1/28 INR = 1.56 1/29 INR = 2.05  Goal of Therapy:  INR 2-3 Monitor platelets by anticoagulation protocol: Yes   Plan:  Patient did not receive warfarin 4 mg dose on 1/26.  Per [facility] note, dose was contraindicated.  Will go ahead and increase warfarin dose to 5 mg for today and reassess INR in AM as INR has remained subtherapeutic.  Follow INR/CBC closely as  patient with GI bleed and PTA dosing listed as 2 mg on Monday and Friday with 3 mg all other days.  Patient admitted with therapeutic INR.   1/28 AM Continue warfarin 5 mg daily and f/u INR in AM.  1/29 INR therapeutic. Reduce to 3 mg daily and recheck in AM.  Pharmacy will continue to follow.  Murrell Converse, PharmD Clinical Pharmacist 03/08/2015

## 2015-03-08 NOTE — Discharge Summary (Addendum)
Eric Gill, is a 65 y.o. male  DOB 08-Apr-1950  MRN [id number].  Admission date:  02/23/2015  Admitting Physician  Vaughan Basta, MD  Discharge Date:  03/08/2015   Primary MD  Fulton Reek D, MD  Recommendations for primary care physician for things to follow:   Will  be transferred to [facility] for worsening renal failure and possible transplant rejection.   Admission Diagnosis  Gastrointestinal hemorrhage with melena [K92.1] Anemia, unspecified anemia type [D64.9]   Discharge Diagnosis  Gastrointestinal hemorrhage with melena [K92.1] Anemia, unspecified anemia type [D64.9]    Principal Problem:   GI bleed Active Problems:   Acute blood loss anemia      Past Medical History  Diagnosis Date  . Status post kidney transplant 2001  . Hyperparathyroidism ([place])   . DVT (deep venous thrombosis) ([place])   . TIA (transient ischemic attack)   . Essential hypertension   . History of CMV   . Pedal edema   . Rosacea   . Antiphospholipid antibody syndrome ([place])   . Hyperlipidemia   . Vascular dementia without behavioral disturbance     Past Surgical History  Procedure Laterality Date  . Esophagogastroduodenoscopy Left 03/01/2015    Procedure: ESOPHAGOGASTRODUODENOSCOPY (EGD);  Surgeon: Hulen Luster, MD;  Location: [facility] ENDOSCOPY;  Service: Endoscopy;  Laterality: Left;  . Colonoscopy with propofol N/A 03/06/2015    Procedure: COLONOSCOPY WITH PROPOFOL;  Surgeon: Josefine Class, MD;  Location: [facility] ENDOSCOPY;  Service: Endoscopy;  Laterality: N/A;  . Esophagogastroduodenoscopy (egd) with propofol  03/06/2015    Procedure: ESOPHAGOGASTRODUODENOSCOPY (EGD) WITH PROPOFOL;  Surgeon: Josefine Class, MD;  Location: [facility] ENDOSCOPY;  Service: Endoscopy;;       History of present illness and  Hospital Course:      Kindly see H&P for history of present illness and admission details, please review complete Labs, Consult reports and Test reports for all details in brief  HPI  from the history and physical done on the day of admission 65 year old male patient with history of kidney transplant on immunosuppressants, hyperparathyroidism, antiphospholipid antibody syndrome, history of DVT, TIA admitted because of coffee-ground vomiting, acute blood loss anemia with hemoglobin 7.3 on admission. He is admitted for that.   Hospital Course  #1 acute blood loss anemia likely secondary to upright upper GI bleed: Patient had tarry stools, coffee-ground vomiting. Started on IV PPIs, stopped the Coumadin that he was taking. Obtained a GI consult, patient followed by Dr. Candace Cruise from gastroenterology, because the INR was 2.9 on admission.EGD was not done immediaately. Patient had an endoscopy after the INR dropped to 2. He had to bridging with heparin because of his antiphospholipid antibody syndrome. Patient had EGD done on January 22 which showed antritis, polyp in the fundus, pyloric channel ulcer with clean base. Patient started on IV PPI twice a day, monitor the hemoglobin Closely. Patient received 2 units of packed RBC. Hemoglobin stayed around 7.6. However it continued to drop 6.3,be cause of persistent anemia, patient was taken to colonoscopy, patient colonoscopy showed some diverticula, internal hemorrhoids. Dr. Thurmond Butts also did capsule endoscopy as well. The results are pending. Advised the patient to continue PPI IV twice a day for 8 weeks, started on Carafate solution every 6 hours also. We resume the heparin drip, continued on Coumadin also. Because of worsening renal failure and anemia Dr. Juleen China  from nephrology recommended transfer to [facility] where he had transplant doctors and nephrology.  2. Acute on chronic renal failure, kidney disease stage  III: History of for renal transplant in 2001. Baseline creatinine 2.2. Patient  creatinine went up to 3.27 gradually. Seen by nephrology, started on IV hydration. Patient tacrolimus was stopped and ordered the tacrolimus levels. Prednisone is increased to 60 mg for acute stress .[name] Continue mycophenolate for transplant. Ordered renal  ultrasound also. Because of worsening renal failure, anemia, borderline platelets, discussed with nephrology, patient will benefit from transfer to [facility]. Started the transfer process. #3 anemia of chronic kidney disease stage III ;with GI bleed: Received 2 units of packed RBC.,  #4 history of TIA, vascular dementia: Follows up with  Neurology . #5 antiphospholipid antibody syndrome: Patient right now getting heparin drip, Coumadin. #6 hypertension: Controlled continue amlodipine, Coreg, Cozaar.    Discharge Condition: stable   Follow UP      Discharge Instructions  and  Discharge Medications        Medication List    STOP taking these medications        furosemide 20 MG tablet  Commonly known as:  LASIX     omeprazole 20 MG capsule  Commonly known as:  PRILOSEC      TAKE these medications        amLODipine 5 MG tablet  Commonly known as:  NORVASC  Take 5 mg by mouth daily at 12 noon.     calcitRIOL 0.25 MCG capsule  Commonly known as:  ROCALTROL  Take 0.25 mcg by mouth at bedtime.     CELLCEPT 500 MG tablet  Generic drug:  mycophenolate  Take 500 mg by mouth 2 (two) times daily.     citalopram 10 MG tablet  Commonly known as:  CELEXA  Take 10 mg by mouth at bedtime.     COUMADIN 1 MG tablet  Generic drug:  warfarin  Take 2-3 mg by mouth at bedtime. Pt takes 2mg  on Monday and Friday.   Pt takes 3mg  all other days.   Pt is only able to use brand name.     donepezil 10 MG tablet  Commonly known as:  ARICEPT  Take 10 mg by mouth at bedtime.     enoxaparin 30 MG/0.3ML injection  Commonly known as:  LOVENOX  Inject 0.65 mLs (65 mg total) into the skin daily.     labetalol 200 MG tablet  Commonly known as:   NORMODYNE  Take 200 mg by mouth 2 (two) times daily.     losartan 50 MG tablet  Commonly known as:  COZAAR  Take 50 mg by mouth at bedtime.     montelukast 10 MG tablet  Commonly known as:  SINGULAIR  Take 10 mg by mouth at bedtime.     pantoprazole 40 MG tablet  Commonly known as:  PROTONIX  Take 1 tablet (40 mg total) by mouth 2 (two) times daily before a meal.     predniSONE 5 MG tablet  Commonly known as:  DELTASONE  Take 5 mg by mouth daily.     PROGRAF 0.5 MG capsule  Generic drug:  tacrolimus  Take 0.5 mg by mouth at bedtime.     sucralfate 1 GM/10ML suspension  Commonly known as:  CARAFATE  Take 10 mLs (1 g total) by mouth every 6 (six) hours.          Diet and Activity recommendation: See Discharge Instructions above   Consults obtained - nephrology Gastroenterology   Major procedures and Radiology Reports - PLEASE review detailed and final reports for all details, in brief -  EGD, colonoscopy, capsule endoscopy    US Renal  03/07/2015  CLINICAL DATA:  History transplant kidney with acute renal injury EXAM: RENAL / URINARY TRACT ULTRASOUND COMPLETE COMPARISON:  09/23/2014 FINDINGS: Right Kidney: Length: 4.3 cm. Diffuse atrophy is noted similar to that seen on prior exam. Left Kidney: Length: 5.6 cm.  Diffuse atrophy is noted seen on prior exam. Bladder: Appears normal for degree of bladder distention. The left pelvic transplant kidney is noted measuring 10.4 cm. No obstructive changes are noted. IMPRESSION: Atrophic native kidneys consistent with the history of end-stage renal disease. Left pelvic transplant kidney without acute complication. Electronically Signed   By: Inez Catalina M.D.   On: 03/07/2015 15:44    Micro Results    No results found for this or any previous visit (from the past 240 hour(s)).     Today   Subjective:   Eric Gill today has no headache,no chest abdominal pain,no new weakness tingling or numbness, stable for transfer  to [facility] when the bed is available.  Objective:   Blood pressure 126/64, pulse 61, temperature 97.7 F (36.5 C), temperature source Oral, resp. rate 20, height 5\' 7"  (1.702 m), weight 64.774 kg (142 lb 12.8 oz), SpO2 97 %.   Intake/Output Summary (Last 24 hours) at 03/08/15 1203 Last data filed at 03/08/15 0930  Gross per 24 hour  Intake    886 ml  Output      0 ml  Net    886 ml    Exam Awake Alert, Oriented x 3, No new F.N deficits, Normal affect [place].AT,PERRAL Supple Neck,No JVD, No cervical lymphadenopathy appriciated.  Symmetrical Chest wall movement, Good air movement bilaterally, CTAB RRR,No Gallops,Rubs or new Murmurs, No Parasternal Heave +ve B.Sounds, Abd Soft, Non tender, No organomegaly appriciated, No rebound -guarding or rigidity. No Cyanosis, Clubbing or edema, No new Rash or bruise  Data Review   CBC w Diff:  Lab Results  Component Value Date   WBC 8.9 03/08/2015   HGB 6.9* 03/08/2015   HCT 21.3* 03/08/2015   PLT 202 03/08/2015   LYMPHOPCT 15 10/29/2014   MONOPCT 9 10/29/2014   EOSPCT 3 10/29/2014   BASOPCT 1 10/29/2014    CMP:  Lab Results  Component Value Date   NA 141 03/08/2015   K 4.2 03/08/2015   CL 114* 03/08/2015   CO2 21* 03/08/2015   BUN 43* 03/08/2015   CREATININE 2.89* 03/08/2015   PROT 5.4* 02/23/2015   ALBUMIN 3.3* 03/08/2015   BILITOT 0.6 02/23/2015   ALKPHOS 64 02/23/2015   AST 19 02/23/2015   ALT 13* 02/23/2015  .as patient hb improved to 6.9 ,INR more than 2,with improvement in  Kidney function,nephrologist Dr.Kolluru recommended its okay for pt to be discharged home.advised daughter to give higher dose prednisone ,follow up with nephrologist tomorrow and transplant doctor also Monday or Tuesday.  Total Time in preparing paper work, data evaluation and todays exam - 50 minutes  Garen Woolbright M.D on 03/08/2015 at 12:03 PM    Note: This dictation was prepared with Dragon dictation along with  smaller phrase technology. Any transcriptional errors that result from this process are unintentional.

## 2015-03-08 NOTE — Progress Notes (Signed)
[facility]  ROUNDING NOTE   Subjective:   Laying in bed. hgb 6.9  Creatinine 2.89 (3.27)  Objective:  Vital signs in last 24 hours:  Temp:  [97.7 F (36.5 C)-97.9 F (36.6 C)] 97.7 F (36.5 C) (01/29 0457) Pulse Rate:  [55-64] 61 (01/29 0457) Resp:  [18-20] 20 (01/29 0457) BP: (126-142)/(60-64) 126/64 mmHg (01/29 0457) SpO2:  [97 %-100 %] 97 % (01/29 0457)  Weight change:  Filed Weights   02/23/15 1558 02/23/15 1953  Weight: 65.772 kg (145 lb) 64.774 kg (142 lb 12.8 oz)    Intake/Output: I/O last 3 completed shifts: In: 360 [P.O.:360] Out: -    Intake/Output this shift:  Total I/O In: 646 [P.O.:240; I.V.:406] Out: -   Physical Exam: General: NAD, laying in bed  Head: Normocephalic, atraumatic. Moist oral mucosal membranes  Eyes: Anicteric  Neck: Supple, trachea midline  Lungs:  Clear to auscultation, normal effort   Heart: Regular rate and rhythm  Abdomen:  Soft, nontender, BS present, left lower quadrant allograft kidney  Extremities: no peripheral edema.  Neurologic: Nonfocal, moving all four extremities  Skin: No lesions       Basic Metabolic Panel:  Recent Labs Lab 03/04/15 0609 03/05/15 0533 03/06/15 0631 03/07/15 0304 03/08/15 0555  NA 137 142 135 139 141  K 4.1 4.2 4.2 5.0 4.2  CL 110 116* 112* 114* 114*  CO2 21* 20* 18* 19* 21*  GLUCOSE 87 100* 89 167* 128*  BUN 35* 32* 26* 36* 43*  CREATININE 3.17* 2.81* 2.77* 3.27* 2.89*  CALCIUM 8.5* 8.9 8.6* 8.9 9.2  PHOS  --   --  3.0 3.3 3.5    Liver Function Tests:  Recent Labs Lab 03/06/15 0631 03/07/15 0304 03/08/15 0555  ALBUMIN 3.0* 2.9* 3.3*   No results for input(s): LIPASE, AMYLASE in the last 168 hours. No results for input(s): AMMONIA in the last 168 hours.  CBC:  Recent Labs Lab 03/04/15 0609 03/05/15 0533 03/06/15 0631 03/07/15 0304 03/08/15 0555  WBC 6.5 6.1 5.2 5.4 8.9  HGB 6.8* 6.3* 6.7* 6.5* 6.9*  HCT 21.3* 20.0* 20.7* 20.1* 21.3*  MCV 89.4 87.7  87.5 87.1 86.8  PLT 161 160 171 170 202    Cardiac Enzymes: No results for input(s): CKTOTAL, CKMB, CKMBINDEX, TROPONINI in the last 168 hours.  BNP: Invalid input(s): POCBNP  CBG: No results for input(s): GLUCAP in the last 168 hours.  Microbiology: Results for orders placed or performed during the hospital encounter of 10/29/14  Urine culture     Status: None   Collection Time: 10/29/14  1:01 PM  Result Value Ref Range Status   Specimen Description URINE, RANDOM  Final   Special Requests NONE  Final   Culture 7,000 COLONIES/mL INSIGNIFICANT GROWTH  Final   Report Status 10/31/2014 FINAL  Final    Coagulation Studies:  Recent Labs  03/06/15 0631 03/07/15 0304 03/08/15 0555  LABPROT 18.1* 18.7* 23.0*  INR 1.49 1.56 2.05    Urinalysis: No results for input(s): COLORURINE, LABSPEC, PHURINE, GLUCOSEU, HGBUR, BILIRUBINUR, KETONESUR, PROTEINUR, UROBILINOGEN, NITRITE, LEUKOCYTESUR in the last 72 hours.  Invalid input(s): APPERANCEUR    Imaging: US Renal  03/07/2015  CLINICAL DATA:  History transplant kidney with acute renal injury EXAM: RENAL / URINARY TRACT ULTRASOUND COMPLETE COMPARISON:  09/23/2014 FINDINGS: Right Kidney: Length: 4.3 cm. Diffuse atrophy is noted similar to that seen on prior exam. Left Kidney: Length: 5.6 cm.  Diffuse atrophy is noted seen on prior exam. Bladder: Appears normal  for degree of bladder distention. The left pelvic transplant kidney is noted measuring 10.4 cm. No obstructive changes are noted. IMPRESSION: Atrophic native kidneys consistent with the history of end-stage renal disease. Left pelvic transplant kidney without acute complication. Electronically Signed   By: Inez Catalina M.D.   On: 03/07/2015 15:44     Medications:     . amLODipine  10 mg Oral Q1200  . carvedilol  6.25 mg Oral BID WC  . citalopram  10 mg Oral QHS  . donepezil  10 mg Oral QHS  . montelukast  10 mg Oral QHS  . mycophenolate  500 mg Oral BID  . pantoprazole  40  mg Oral BID AC  . predniSONE  60 mg Oral Daily  . sodium bicarbonate  650 mg Oral TID  . sucralfate  1 g Oral 4 times per day  . warfarin  3 mg Oral q1800  . Warfarin - Pharmacist Dosing Inpatient   Does not apply q1800   loperamide, ondansetron (ZOFRAN) IV  Assessment/ Plan:  65 y.o. 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   1. Acute renal failure on Chronic kidney disease stage III T Status post renal transplantation in 2001. His baseline creatinine is 2.2 from December 2016. Creatinine now trending back up. With metabolic acidosis - Continue sodium bicarbonate for buffering.  - Continue mycophenolate.  - Increased prednisone to 60mg  daily due to acute stressors - Hold tacrolimus. Tacrolimus level pending.  - Monitor renal function, renally dose all medications.  - Holding losartan.   2. 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 Pt received blood transfusion this admission.  Ulcer in pylorus. Hemoglobin 6.9 - appreciate GI input.  3. Hypertension.  Blood pressure at goal - continue coreg, and amlodipine. Holding losartan.   4.  Secondary hyperparathyroidism. PTH 44 - Discontinued calcitriol   Patient will benefit from going to [facility] where his primary nephrologist is. I have spoken to Dr. Sharene Skeans Nephrology   LOS: [address] [place] 1/29/201711:36 AM

## 2015-03-08 NOTE — Progress Notes (Signed)
Eric Gill from [facility] called to get an update on the pt; wanted recent Hgb; Hgb at 0555 on 03/08/15 = 6.9; ? If there were orders to transfuse, no current orders to transfuse; to callback when and if bed becomes available today

## 2015-03-08 NOTE — Progress Notes (Signed)
MD order received to discharge pt home today; verbally reviewed AVS with pt and pt's daughter including medications and follow up appointments/pt to call Dr Doy Hutching and [facility] for appointments; pt verbalized understanding with no questions at present; pt discharged via wheelchair by nursing to the visitor's entrance

## 2015-03-08 NOTE — Progress Notes (Addendum)
[place] NAME: Eric Gill    MR#:  [id number]  DATE OF BIRTH:  Jun 06, 1950  Supposed to go to [place] today.[name]   REVIEW OF SYSTEMS:   Review of Systems  Constitutional: Negative for fever, chills and weight loss.  HENT: Negative for ear discharge, ear pain and nosebleeds.   Eyes: Negative for blurred vision, pain and discharge.  Respiratory: Negative for sputum production, shortness of breath, wheezing and stridor.   Cardiovascular: Negative for chest pain, palpitations, orthopnea and PND.  Gastrointestinal: Negative for nausea, vomiting, abdominal pain and diarrhea.  Genitourinary: Negative for urgency and frequency.  Musculoskeletal: Negative for back pain and joint pain.  Neurological: Negative for sensory change, speech change, focal weakness and weakness.  Psychiatric/Behavioral: Negative for depression and hallucinations. The patient is not nervous/anxious.   All other systems reviewed and are negative.  Tolerating Diet:yes Tolerating PT: not needed  DRUG ALLERGIES:   Allergies  Allergen Reactions  . Statins Nausea And Vomiting and Other (See Comments)    Reaction:  Muscle pain    VITALS:  Blood pressure 126/64, pulse 61, temperature 97.7 F (36.5 C), temperature source Oral, resp. rate 20, height 5\' 7"  (1.702 m), weight 64.774 kg (142 lb 12.8 oz), SpO2 97 %. PHYSICAL EXAMINATION:   Physical Exam  GENERAL:  65 y.o.-year-old patient lying in the bed with no acute distress.  EYES: Pupils equal, round, reactive to light and accommodation. No scleral icterus. Extraocular muscles intact.  HEENT: Head atraumatic, normocephalic. Oropharynx and nasopharynx clear.  NECK:  Supple, no jugular venous distention. No thyroid enlargement, no tenderness.  LUNGS: Normal breath sounds bilaterally, no wheezing, rales, rhonchi. No use of accessory muscles of respiration.  CARDIOVASCULAR: S1, S2 normal. No murmurs, rubs, or  gallops.  ABDOMEN: Soft, nontender, nondistended. Bowel sounds present. No organomegaly or mass.  EXTREMITIES: No cyanosis, clubbing or edema b/l.    NEUROLOGIC: Cranial nerves II through XII are intact. No focal Motor or sensory deficits b/l.   PSYCHIATRIC: The patient is alert and oriented x 3.  SKIN: No obvious rash, lesion, or ulcer.  LABORATORY PANEL:  CBC  Recent Labs Lab 03/08/15 0555  WBC 8.9  HGB 6.9*  HCT 21.3*  PLT 202    Chemistries   Recent Labs Lab 03/08/15 0555  NA 141  K 4.2  CL 114*  CO2 21*  GLUCOSE 128*  BUN 43*  CREATININE 2.89*  CALCIUM 9.2     ASSESSMENT AND PLAN:  Eric Gill is a 65 y.o. male with a known history of status post kidney transplant, hyperparathyroidism, DVT, TIA, antiphospholipid syndrome, chronic Coumadin use, vascular dementia, hyperlipidemia came in with coffee ground vomitus  * Acute blood loss anemia secondary to most likely upper GI bleed Hemoglobin is 7.3--6.7--6.2--1 unit BT--7.0-7.3-7.3--7.6--7.8--7.4-6.8-6.3 EGD showed gastric ulcer. -Continue  po Protonix twice a day Colonoscopy unremarkable. Had capsule endoscopy. Because of persistent anemia, worsening renal failure requested transfer to [facility], he will go to [facility] when the bed is available. *  Status post kidney transplant; continue mycophenolate, stress dose prednisone,.  Continue amlodipine, coreg, cozaar for now and follow his blood pressure. Stable.  * Antiphospholipid syndrome;  Stopped heparin drip for colonoscopy. -He also had history of blood clots in the past and TIAs and he was advised to take Coumadin. -Dr. Dorise Bullion spoke to patient's hematologist at [facility]  INR is slowly rising but not therauptic yet so continue heparin, Coumadin.  *  TIA and vascular dementia Continue home medications.  Management plans discussed with the patient, family and they are in agreement.  * acute on chronic renal failure CKD stage 3; history of kidney  transplant in 2001. Baseline creatinine 2.2.  because of worsening renal failure with Dr. Juleen China recommended transfer to [facility].  . Management plans discussed with the patient, family and they are in agreement Likely discharge home today with lovenox and coumadin,.wife agreed for this.  Case discussed with Care Management/Social Worker.  CODE STATUS: full  DVT Prophylaxis: scd, heparin gtt  TOTAL TIME TAKING CARE OF THIS PATIENT:25 minutes.   >50% time spent on counselling and coordination of care pt and wife    Note: This dictation was prepared with Dragon dictation along with smaller phrase technology. Any transcriptional errors that result from this process are unintentional.  Kerryn Tennant M.D on 03/08/2015 at 12:05 PM  Between 7am to 6pm - Pager - [phone]  After 6pm go to [email or web address] - password [email or web address] [place] [email or web address]  Office  [phone]  CC: Primary care physician; Idelle Crouch, MD

## 2015-03-09 LAB — H PYLORI, IGM, IGG, IGA AB: H Pylori IgG: <0.9 U/mL (ref 0.0–0.8)

## 2015-04-23 ENCOUNTER — Other Ambulatory Visit: Payer: Self-pay | Admitting: Gastroenterology

## 2015-04-23 DIAGNOSIS — K529 Noninfective gastroenteritis and colitis, unspecified: Secondary | ICD-10-CM

## 2015-04-24 ENCOUNTER — Ambulatory Visit
Admission: RE | Admit: 2015-04-24 | Discharge: 2015-04-24 | Disposition: A | Discharge status: Home / Self Care | Payer: Medicare Other | Source: Ambulatory Visit | Attending: Gastroenterology | Admitting: Gastroenterology

## 2015-04-24 DIAGNOSIS — K529 Noninfective gastroenteritis and colitis, unspecified: Secondary | ICD-10-CM | POA: Diagnosis not present

## 2015-04-24 DIAGNOSIS — I714 Abdominal aortic aneurysm, without rupture: Secondary | ICD-10-CM | POA: Diagnosis not present

## 2015-04-24 DIAGNOSIS — Z940 Kidney transplant status: Secondary | ICD-10-CM | POA: Diagnosis not present

## 2015-04-24 DIAGNOSIS — K8020 Calculus of gallbladder without cholecystitis without obstruction: Secondary | ICD-10-CM | POA: Insufficient documentation

## 2015-05-21 ENCOUNTER — Encounter: Payer: Self-pay | Admitting: *Deleted

## 2015-05-25 ENCOUNTER — Ambulatory Visit
Admission: RE | Admit: 2015-05-25 | Discharge: 2015-05-25 | Disposition: A | Discharge status: Home / Self Care | Payer: Medicare Other | Source: Ambulatory Visit | Attending: Gastroenterology | Admitting: Gastroenterology

## 2015-05-25 ENCOUNTER — Ambulatory Visit: Payer: Medicare Other | Admitting: Anesthesiology

## 2015-05-25 ENCOUNTER — Encounter: Payer: Self-pay | Admitting: *Deleted

## 2015-05-25 ENCOUNTER — Encounter
Admission: RE | Disposition: A | Discharge status: Home / Self Care | Payer: Self-pay | Source: Ambulatory Visit | Attending: Gastroenterology

## 2015-05-25 DIAGNOSIS — K219 Gastro-esophageal reflux disease without esophagitis: Secondary | ICD-10-CM | POA: Insufficient documentation

## 2015-05-25 DIAGNOSIS — Z87891 Personal history of nicotine dependence: Secondary | ICD-10-CM | POA: Diagnosis not present

## 2015-05-25 DIAGNOSIS — E785 Hyperlipidemia, unspecified: Secondary | ICD-10-CM | POA: Diagnosis not present

## 2015-05-25 DIAGNOSIS — I10 Essential (primary) hypertension: Secondary | ICD-10-CM | POA: Insufficient documentation

## 2015-05-25 DIAGNOSIS — F0390 Unspecified dementia without behavioral disturbance: Secondary | ICD-10-CM | POA: Insufficient documentation

## 2015-05-25 DIAGNOSIS — Z8673 Personal history of transient ischemic attack (TIA), and cerebral infarction without residual deficits: Secondary | ICD-10-CM | POA: Insufficient documentation

## 2015-05-25 DIAGNOSIS — I739 Peripheral vascular disease, unspecified: Secondary | ICD-10-CM | POA: Insufficient documentation

## 2015-05-25 DIAGNOSIS — Z86718 Personal history of other venous thrombosis and embolism: Secondary | ICD-10-CM | POA: Diagnosis not present

## 2015-05-25 DIAGNOSIS — M1990 Unspecified osteoarthritis, unspecified site: Secondary | ICD-10-CM | POA: Insufficient documentation

## 2015-05-25 DIAGNOSIS — K279 Peptic ulcer, site unspecified, unspecified as acute or chronic, without hemorrhage or perforation: Secondary | ICD-10-CM | POA: Diagnosis not present

## 2015-05-25 HISTORY — DX: Peripheral vascular disease, unspecified: I73.9

## 2015-05-25 HISTORY — DX: Gastric ulcer, unspecified as acute or chronic, without hemorrhage or perforation: K25.9

## 2015-05-25 HISTORY — DX: Anemia, unspecified: D64.9

## 2015-05-25 HISTORY — DX: Gastro-esophageal reflux disease without esophagitis: K21.9

## 2015-05-25 HISTORY — DX: Unspecified osteoarthritis, unspecified site: M19.90

## 2015-05-25 HISTORY — PX: ESOPHAGOGASTRODUODENOSCOPY (EGD) WITH PROPOFOL: SHX5813

## 2015-05-25 SURGERY — ESOPHAGOGASTRODUODENOSCOPY (EGD) WITH PROPOFOL
Anesthesia: General

## 2015-05-25 MED ORDER — SODIUM CHLORIDE 0.9 % IV SOLN
INTRAVENOUS | Status: DC
  Administered 2015-05-25: 09:00:00 via INTRAVENOUS
  Administered 2015-05-25: 1000 mL via INTRAVENOUS

## 2015-05-25 MED ORDER — PROPOFOL 500 MG/50ML IV EMUL
INTRAVENOUS | Status: DC | PRN
  Administered 2015-05-25: 60 ug/kg/min via INTRAVENOUS

## 2015-05-25 MED ORDER — PROPOFOL 10 MG/ML IV BOLUS
INTRAVENOUS | Status: DC | PRN
  Administered 2015-05-25 (×2): 20 mg via INTRAVENOUS

## 2015-05-25 NOTE — Op Note (Signed)
[facility] Patient Name: Eric Gill Procedure Date: 05/25/2015 9:20 AM MRN: [id number] Account #: [id number] Date of Birth: 1950-02-22 Admit Type: Outpatient Age: 65 Room: [facility] ENDO ROOM 2 Gender: Male Note Status: Finalized Procedure:            Upper GI endoscopy Indications:          Follow-up of peptic ulcer Patient Profile:      This is a 65 year old male. Providers:            Gerrit Heck. Rayann Heman, MD Referring MD:         Sherryll Burger (Referring MD), Rondel Baton, MD                        (Referring MD), Leonie Douglas. Doy Hutching, MD (Referring MD) Medicines:            Propofol per Anesthesia Complications:        No immediate complications. Procedure:            Pre-Anesthesia Assessment:                       - Prior to the procedure, a History and Physical was                        performed, and patient medications, allergies and                        sensitivities were reviewed. The patient's tolerance of                        previous anesthesia was reviewed.                       After obtaining informed consent, the endoscope was                        passed under direct vision. Throughout the procedure,                        the patient's blood pressure, pulse, and oxygen                        saturations were monitored continuously. The                        Colonoscope was introduced through the mouth, with the                        intention of advancing to the stomach. The scope was                        advanced to the gastric body before the procedure was                        aborted. Medications were not given. The upper GI                        endoscopy was accomplished without difficulty. The                        patient  tolerated the procedure well. The procedure was                        aborted due to presence of food. Findings:      The esophagus was normal.      A large amount of food (residue) was  found in the gastric body.      - Procedure aborted in stomach immediately upon seeing food. Impression:           - The procedure was aborted due to presence of food.                       - Normal esophagus.                       - A large amount of food (residue) in the stomach.                       - Procedure aborted in stomach immediately upon seeing                        food.                       - No specimens collected. Recommendation:       - Observe patient in GI recovery unit.                       - Resume regular diet.                       - Continue present medications.                       - Repeat upper endoscopy at appointment to be scheduled                        to check healing. Use 24 HOURS OF CLEAR LIQUIDS.                       - Restart coumadin today                       - Decrease protonix to 40 mg daily.                       - The findings and recommendations were discussed with                        the patient.                       - The findings and recommendations were discussed with                        the patient's family. Procedure Code(s):    --- Professional ---                       [phone], 52, Esophagogastroduodenoscopy, flexible,                        transoral; diagnostic, including collection of  specimen(s) by brushing or washing, when performed                        (separate procedure) Diagnosis Code(s):    --- Professional ---                       Z53.8, Procedure and treatment not carried out for                        other reasons                       K27.9, Peptic ulcer, site unspecified, unspecified as                        acute or chronic, without hemorrhage or perforation CPT copyright 2016 American Medical Association. All rights reserved. The codes documented in this report are preliminary and upon coder review may  be revised to meet current compliance requirements. Mellody Life,  MD 05/25/2015 9:32:03 AM This report has been signed electronically. Number of Addenda: 0 Note Initiated On: 05/25/2015 9:20 AM      [facility]

## 2015-05-25 NOTE — Anesthesia Preprocedure Evaluation (Signed)
Anesthesia Evaluation  Patient identified by MRN, date of birth, ID band Patient awake    Reviewed: Allergy & Precautions, H&P , NPO status , [name] Chart, lab work & pertinent test results, reviewed documented beta blocker date and time   Airway Mallampati: II   Neck ROM: full    Dental  (+) Poor Dentition, Teeth Intact, Upper Dentures   Pulmonary neg pulmonary ROS, former smoker,    Pulmonary exam normal        Cardiovascular hypertension, + Peripheral Vascular Disease  negative cardio ROS Normal cardiovascular exam Rhythm:regular Rate:Normal     Neuro/Psych PSYCHIATRIC DISORDERS TIAnegative neurological ROS  negative psych ROS   GI/Hepatic negative GI ROS, Neg liver ROS, PUD, GERD  ,  Endo/Other  negative endocrine ROS  Renal/GU   negative genitourinary   Musculoskeletal   Abdominal   Peds  Hematology negative hematology ROS (+)   Anesthesia Other Findings Past Medical History:   Status post kidney transplant                   2001         Hyperparathyroidism ([place])                                    DVT (deep venous thrombosis) (HCC)                           TIA (transient ischemic attack)                              Essential hypertension                                       History of CMV                                               Pedal edema                                                  Rosacea                                                      Antiphospholipid antibody syndrome (HCC)                     Hyperlipidemia                                               Vascular dementia without behavioral disturban*              Anemia  Arthritis                                                    Peripheral vascular disease (HCC)                            Gastric ulcer                                                GERD (gastroesophageal  reflux disease)                     Past Surgical History:   ESOPHAGOGASTRODUODENOSCOPY                      Left 03/01/2015      Comment:Procedure: ESOPHAGOGASTRODUODENOSCOPY (EGD);                Surgeon: Hulen Luster, MD;  Location: [facility]               ENDOSCOPY;  Service: Endoscopy;  Laterality:               Left;   COLONOSCOPY WITH PROPOFOL                       N/A 03/06/2015      Comment:Procedure: COLONOSCOPY WITH PROPOFOL;  Surgeon:              Josefine Class, MD;  Location: [facility]               ENDOSCOPY;  Service: Endoscopy;  Laterality:               N/A;   ESOPHAGOGASTRODUODENOSCOPY (EGD) WITH PROPOFOL   03/06/2015      Comment:Procedure: ESOPHAGOGASTRODUODENOSCOPY (EGD)               WITH PROPOFOL;  Surgeon: Josefine Class,               MD;  Location: [facility] ENDOSCOPY;  Service:               Endoscopy;;   KIDNEY TRANSPLANT                                             Reproductive/Obstetrics                             Anesthesia Physical Anesthesia Plan  ASA: III  Anesthesia Plan: General   Post-op Pain Management:    Induction:   Airway Management Planned:   Additional Equipment:   Intra-op Plan:   Post-operative Plan:   Informed Consent: I have reviewed the patients History and Physical, chart, labs and discussed the procedure including the risks, benefits and alternatives for the proposed anesthesia with the patient or authorized representative who has indicated his/her understanding and acceptance.   Dental Advisory Given  Plan Discussed with: CRNA  Anesthesia Plan Comments: (Daily steroids On labetalol, none today)  Anesthesia Quick Evaluation  

## 2015-05-25 NOTE — H&P (Signed)
Primary Care Physician:  Idelle Crouch, MD  Pre-Procedure History & Physical: HPI:  Eric Gill is a 65 y.o. male is here for an endoscopy.   Past Medical History  Diagnosis Date  . Status post kidney transplant 2001  . Hyperparathyroidism ([place])   . DVT (deep venous thrombosis) ([place])   . TIA (transient ischemic attack)   . Essential hypertension   . History of CMV   . Pedal edema   . Rosacea   . Antiphospholipid antibody syndrome ([place])   . Hyperlipidemia   . Vascular dementia without behavioral disturbance   . Anemia   . Arthritis   . Peripheral vascular disease ([place])   . Gastric ulcer   . GERD (gastroesophageal reflux disease)     Past Surgical History  Procedure Laterality Date  . Esophagogastroduodenoscopy Left 03/01/2015    Procedure: ESOPHAGOGASTRODUODENOSCOPY (EGD);  Surgeon: Hulen Luster, MD;  Location: [facility] ENDOSCOPY;  Service: Endoscopy;  Laterality: Left;  . Colonoscopy with propofol N/A 03/06/2015    Procedure: COLONOSCOPY WITH PROPOFOL;  Surgeon: Josefine Class, MD;  Location: [facility] ENDOSCOPY;  Service: Endoscopy;  Laterality: N/A;  . Esophagogastroduodenoscopy (egd) with propofol  03/06/2015    Procedure: ESOPHAGOGASTRODUODENOSCOPY (EGD) WITH PROPOFOL;  Surgeon: Josefine Class, MD;  Location: [facility] ENDOSCOPY;  Service: Endoscopy;;  . Kidney transplant      Prior to Admission medications   Medication Sig Start Date End Date Taking? Authorizing Provider  warfarin (COUMADIN) 3 MG tablet Take 1 tablet (3 mg total) by mouth daily at 6 PM. 03/08/15  Yes Epifanio Lesches, MD  amLODipine (NORVASC) 5 MG tablet Take 5 mg by mouth daily at 12 noon.    Historical Provider, MD  calcitRIOL (ROCALTROL) 0.25 MCG capsule Take 0.25 mcg by mouth at bedtime.    Historical Provider, MD  citalopram (CELEXA) 10 MG tablet Take 10 mg by mouth at bedtime.    Historical Provider, MD  donepezil (ARICEPT) 10 MG tablet Take 10 mg by mouth at bedtime.    Historical Provider, MD   labetalol (NORMODYNE) 200 MG tablet Take 200 mg by mouth 2 (two) times daily.     Historical Provider, MD  losartan (COZAAR) 50 MG tablet Take 50 mg by mouth at bedtime.     Historical Provider, MD  montelukast (SINGULAIR) 10 MG tablet Take 10 mg by mouth at bedtime.    Historical Provider, MD  mycophenolate (CELLCEPT) 500 MG tablet Take 500 mg by mouth 2 (two) times daily.    Historical Provider, MD  pantoprazole (PROTONIX) 40 MG tablet Take 1 tablet (40 mg total) by mouth 2 (two) times daily before a meal. 03/06/15   Epifanio Lesches, MD  predniSONE (DELTASONE) 20 MG tablet Take 1 tablet (20 mg total) by mouth daily with breakfast. 03/08/15   Epifanio Lesches, MD  sodium bicarbonate 650 MG tablet Take 1 tablet (650 mg total) by mouth 3 (three) times daily. 03/08/15   Epifanio Lesches, MD  sucralfate (CARAFATE) 1 GM/10ML suspension Take 10 mLs (1 g total) by mouth every 6 (six) hours. 03/07/15   Epifanio Lesches, MD    Allergies as of 05/08/2015 - Review Complete 03/06/2015  Allergen Reaction Noted  . Statins Nausea And Vomiting and Other (See Comments) 10/01/2014    History reviewed. No pertinent family history.  Social History   Social History  . Marital Status: Married    Spouse Name: N/A  . Number of Children: N/A  . Years of Education: N/A   Occupational History  .  Not on file.   Social History Main Topics  . Smoking status: Former Research scientist (life sciences)  . Smokeless tobacco: Never Used  . Alcohol Use: No  . Drug Use: No  . Sexual Activity: Not on file   Other Topics Concern  . Not on file   Social History Narrative     Physical Exam: BP 127/72 mmHg  Pulse 70  Temp(Src) 96.9 F (36.1 C) (Tympanic)  Resp 16  Ht 5\' 5"  (1.651 m)  Wt 60.782 kg (134 lb)  BMI 22.30 kg/m2  SpO2 100% General:   Alert,  pleasant and cooperative in NAD Head:  Normocephalic and atraumatic. Neck:  Supple; no masses or thyromegaly. Lungs:  Clear throughout to auscultation.    Heart:   Regular rate and rhythm. Abdomen:  Soft, nontender and nondistended. Normal bowel sounds, without guarding, and without rebound.   Neurologic:  Alert and  oriented x4;  grossly normal neurologically.  Impression/Plan: Eric Gill is here for an endoscopy to be performed to f/u pyloric channel ulcer,   Risks, benefits, limitations, and alternatives regarding  endoscopy have been reviewed with the patient.  Questions have been answered.  All parties agreeable.   Josefine Class, MD  05/25/2015, 9:13 AM

## 2015-05-25 NOTE — Anesthesia Postprocedure Evaluation (Incomplete)
Anesthesia Post Note  Patient: Eric Gill  Procedure(s) Performed: Procedure(s) (LRB): ESOPHAGOGASTRODUODENOSCOPY (EGD) WITH PROPOFOL (N/A)  Anesthesia Post Evaluation  Last Vitals:  Filed Vitals:   05/25/15 0850  BP: 127/72  Pulse: 70  Temp: 36.1 C  Resp: 16    Last Pain: There were no vitals filed for this visit.               F[place],  Diego Cory

## 2015-05-25 NOTE — Anesthesia Postprocedure Evaluation (Signed)
Anesthesia Post Note  Patient: Eric Gill  Procedure(s) Performed: Procedure(s) (LRB): ESOPHAGOGASTRODUODENOSCOPY (EGD) WITH PROPOFOL (N/A)  Patient location during evaluation: PACU Anesthesia Type: General Level of consciousness: awake and alert Pain management: pain level controlled Vital Signs Assessment: post-procedure vital signs reviewed and stable Respiratory status: spontaneous breathing, nonlabored ventilation, respiratory function stable and patient connected to nasal cannula oxygen Cardiovascular status: blood pressure returned to baseline and stable Postop Assessment: no signs of nausea or vomiting Anesthetic complications: no    Last Vitals:  Filed Vitals:   05/25/15 0950 05/25/15 1000  BP: 161/83 168/73  Pulse: 60 62  Temp:    Resp: 12 12    Last Pain: There were no vitals filed for this visit.               Molli Barrows

## 2015-05-25 NOTE — Transfer of Care (Signed)
Immediate Anesthesia Transfer of Care Note  Patient: Eric Gill  Procedure(s) Performed: Procedure(s): ESOPHAGOGASTRODUODENOSCOPY (EGD) WITH PROPOFOL (N/A)  Patient Location: PACU  Anesthesia Type:General  Level of Consciousness: awake, alert  and oriented  Airway & Oxygen Therapy: Patient Spontanous Breathing and Patient connected to nasal cannula oxygen  Post-op Assessment: Report given to RN and Post -op Vital signs reviewed and stable  Post vital signs: Reviewed and stable  Last Vitals:  Filed Vitals:   05/25/15 0850 05/25/15 0934  BP: 127/72 159/77  Pulse: 70 64  Temp: 36.1 C 36.1 C  Resp: 16 16    Complications: No apparent anesthesia complications

## 2015-06-04 ENCOUNTER — Other Ambulatory Visit: Payer: Self-pay | Admitting: Adult Health Nurse Practitioner

## 2015-06-04 DIAGNOSIS — I10 Essential (primary) hypertension: Secondary | ICD-10-CM

## 2015-06-04 DIAGNOSIS — Z7901 Long term (current) use of anticoagulants: Secondary | ICD-10-CM

## 2015-06-04 DIAGNOSIS — K8000 Calculus of gallbladder with acute cholecystitis without obstruction: Secondary | ICD-10-CM

## 2015-06-04 DIAGNOSIS — I82402 Acute embolism and thrombosis of unspecified deep veins of left lower extremity: Secondary | ICD-10-CM

## 2015-06-04 DIAGNOSIS — G459 Transient cerebral ischemic attack, unspecified: Secondary | ICD-10-CM

## 2015-06-04 DIAGNOSIS — Z940 Kidney transplant status: Secondary | ICD-10-CM

## 2015-06-11 ENCOUNTER — Ambulatory Visit
Admission: RE | Admit: 2015-06-11 | Discharge: 2015-06-11 | Disposition: A | Discharge status: Home / Self Care | Payer: Medicare Other | Source: Ambulatory Visit | Attending: Adult Health Nurse Practitioner | Admitting: Adult Health Nurse Practitioner

## 2015-06-11 DIAGNOSIS — I82402 Acute embolism and thrombosis of unspecified deep veins of left lower extremity: Secondary | ICD-10-CM | POA: Diagnosis not present

## 2015-06-11 DIAGNOSIS — Z7901 Long term (current) use of anticoagulants: Secondary | ICD-10-CM | POA: Diagnosis not present

## 2015-06-11 DIAGNOSIS — I2510 Atherosclerotic heart disease of native coronary artery without angina pectoris: Secondary | ICD-10-CM | POA: Diagnosis not present

## 2015-06-11 DIAGNOSIS — I10 Essential (primary) hypertension: Secondary | ICD-10-CM | POA: Diagnosis present

## 2015-06-11 DIAGNOSIS — K8000 Calculus of gallbladder with acute cholecystitis without obstruction: Secondary | ICD-10-CM | POA: Insufficient documentation

## 2015-06-11 DIAGNOSIS — G459 Transient cerebral ischemic attack, unspecified: Secondary | ICD-10-CM | POA: Diagnosis present

## 2015-06-11 DIAGNOSIS — I714 Abdominal aortic aneurysm, without rupture: Secondary | ICD-10-CM | POA: Diagnosis not present

## 2015-06-11 DIAGNOSIS — R918 Other nonspecific abnormal finding of lung field: Secondary | ICD-10-CM | POA: Insufficient documentation

## 2015-06-11 DIAGNOSIS — Z940 Kidney transplant status: Secondary | ICD-10-CM | POA: Diagnosis not present

## 2015-06-17 ENCOUNTER — Other Ambulatory Visit: Payer: Self-pay | Admitting: Internal Medicine

## 2015-06-17 DIAGNOSIS — R911 Solitary pulmonary nodule: Secondary | ICD-10-CM

## 2015-06-22 ENCOUNTER — Ambulatory Visit
Admission: RE | Admit: 2015-06-22 | Discharge: 2015-06-22 | Disposition: A | Discharge status: Home / Self Care | Payer: Medicare Other | Source: Ambulatory Visit | Attending: Internal Medicine | Admitting: Internal Medicine

## 2015-06-22 DIAGNOSIS — R918 Other nonspecific abnormal finding of lung field: Secondary | ICD-10-CM | POA: Insufficient documentation

## 2015-06-22 DIAGNOSIS — R911 Solitary pulmonary nodule: Secondary | ICD-10-CM | POA: Diagnosis present

## 2015-11-17 ENCOUNTER — Other Ambulatory Visit: Payer: Self-pay | Admitting: Internal Medicine

## 2015-11-17 DIAGNOSIS — R053 Chronic cough: Secondary | ICD-10-CM

## 2015-11-17 DIAGNOSIS — R05 Cough: Secondary | ICD-10-CM

## 2015-11-17 DIAGNOSIS — R911 Solitary pulmonary nodule: Secondary | ICD-10-CM

## 2015-11-23 ENCOUNTER — Ambulatory Visit
Admission: RE | Admit: 2015-11-23 | Discharge: 2015-11-23 | Disposition: A | Discharge status: Home / Self Care | Payer: Medicare Other | Source: Ambulatory Visit | Attending: Internal Medicine | Admitting: Internal Medicine

## 2015-11-23 DIAGNOSIS — R05 Cough: Secondary | ICD-10-CM | POA: Diagnosis present

## 2015-11-23 DIAGNOSIS — R911 Solitary pulmonary nodule: Secondary | ICD-10-CM | POA: Diagnosis not present

## 2015-11-23 DIAGNOSIS — R918 Other nonspecific abnormal finding of lung field: Secondary | ICD-10-CM | POA: Insufficient documentation

## 2015-11-23 DIAGNOSIS — R053 Chronic cough: Secondary | ICD-10-CM

## 2016-01-21 ENCOUNTER — Other Ambulatory Visit
Admission: RE | Admit: 2016-01-21 | Discharge: 2016-01-21 | Disposition: A | Discharge status: Home / Self Care | Payer: Medicare Other | Source: Ambulatory Visit | Attending: Student | Admitting: Student

## 2016-01-21 DIAGNOSIS — K529 Noninfective gastroenteritis and colitis, unspecified: Secondary | ICD-10-CM | POA: Diagnosis present

## 2016-01-28 LAB — PANCREATIC ELASTASE, FECAL

## 2016-01-30 ENCOUNTER — Other Ambulatory Visit
Admission: RE | Admit: 2016-01-30 | Discharge: 2016-01-30 | Disposition: A | Discharge status: Home / Self Care | Payer: Medicare Other | Source: Ambulatory Visit | Attending: Student | Admitting: Student

## 2016-01-30 DIAGNOSIS — K529 Noninfective gastroenteritis and colitis, unspecified: Secondary | ICD-10-CM | POA: Insufficient documentation

## 2016-02-05 LAB — PANCREATIC ELASTASE, FECAL: Pancreatic Elastase-1, Stool: >500 ug Elast./g (ref >200)

## 2016-02-11 ENCOUNTER — Other Ambulatory Visit: Payer: Self-pay | Admitting: Internal Medicine

## 2016-02-11 ENCOUNTER — Other Ambulatory Visit
Admission: RE | Admit: 2016-02-11 | Discharge: 2016-02-11 | Disposition: A | Discharge status: Home / Self Care | Payer: Medicare Other | Source: Ambulatory Visit | Attending: Student | Admitting: Student

## 2016-02-11 DIAGNOSIS — R197 Diarrhea, unspecified: Secondary | ICD-10-CM | POA: Insufficient documentation

## 2016-02-11 DIAGNOSIS — R1084 Generalized abdominal pain: Secondary | ICD-10-CM

## 2016-02-11 LAB — GASTROINTESTINAL PANEL BY PCR, STOOL (REPLACES STOOL CULTURE)
ADENOVIRUS F40/41: NOT DETECTED
ASTROVIRUS: NOT DETECTED
CAMPYLOBACTER SPECIES: NOT DETECTED
Cryptosporidium: NOT DETECTED
Cyclospora cayetanensis: NOT DETECTED
ENTEROAGGREGATIVE E COLI (EAEC): NOT DETECTED
ENTEROPATHOGENIC E COLI (EPEC): DETECTED — AB
ENTEROTOXIGENIC E COLI (ETEC): NOT DETECTED
Entamoeba histolytica: NOT DETECTED
Giardia lamblia: NOT DETECTED
Norovirus GI/GII: NOT DETECTED
Plesimonas shigelloides: NOT DETECTED
Rotavirus A: NOT DETECTED
Salmonella species: NOT DETECTED
Sapovirus (I, II, IV, and V): NOT DETECTED
Shiga like toxin producing E coli (STEC): NOT DETECTED
Shigella/Enteroinvasive E coli (EIEC): NOT DETECTED
VIBRIO CHOLERAE: NOT DETECTED
Vibrio species: NOT DETECTED
Yersinia enterocolitica: NOT DETECTED

## 2016-02-11 LAB — C DIFFICILE QUICK SCREEN W PCR REFLEX
C DIFFICILE (CDIFF) INTERP: NOT DETECTED
C Diff antigen: NEGATIVE
C Diff toxin: NEGATIVE

## 2016-03-19 ENCOUNTER — Other Ambulatory Visit
Admission: RE | Admit: 2016-03-19 | Discharge: 2016-03-19 | Disposition: A | Discharge status: Home / Self Care | Payer: Medicare Other | Source: Ambulatory Visit | Attending: Student | Admitting: Student

## 2016-03-19 DIAGNOSIS — A498 Other bacterial infections of unspecified site: Secondary | ICD-10-CM | POA: Diagnosis present

## 2016-03-19 LAB — GASTROINTESTINAL PANEL BY PCR, STOOL (REPLACES STOOL CULTURE)

## 2016-07-20 ENCOUNTER — Other Ambulatory Visit: Payer: Self-pay | Admitting: Specialist

## 2016-07-20 DIAGNOSIS — R911 Solitary pulmonary nodule: Secondary | ICD-10-CM

## 2016-08-20 IMAGING — CT CT HEAD W/O CM
1 series · 16 of 30 positions shown, 20 images · non-contrast
Comparison: 10/07/2014

CLINICAL DATA: Confusion, weakness. Syncope 3 weeks ago, fatigue
since.

EXAM:
CT HEAD WITHOUT CONTRAST
TECHNIQUE: Contiguous axial images were obtained from the base of the skull
through the vertex without intravenous contrast.

[Series 2: head wo · axial · 0.40mm/px · z∈[+374,+526]mm · 16 of 36 slices shown, 20 images]
[im 2/36  brain]
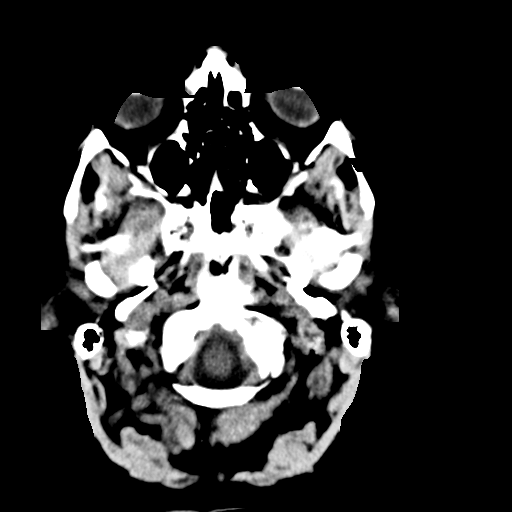
[im 2/36  bone]
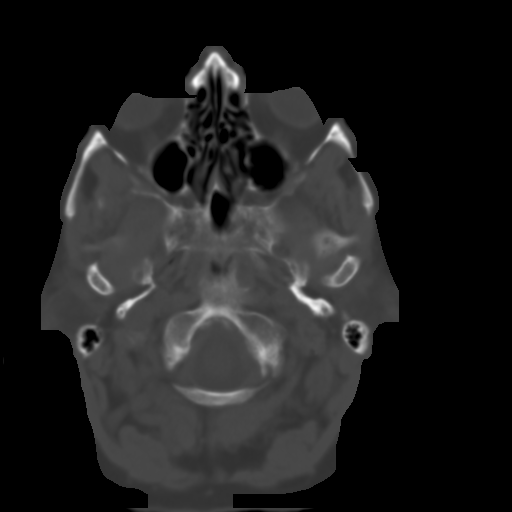
[im 4/36  brain]
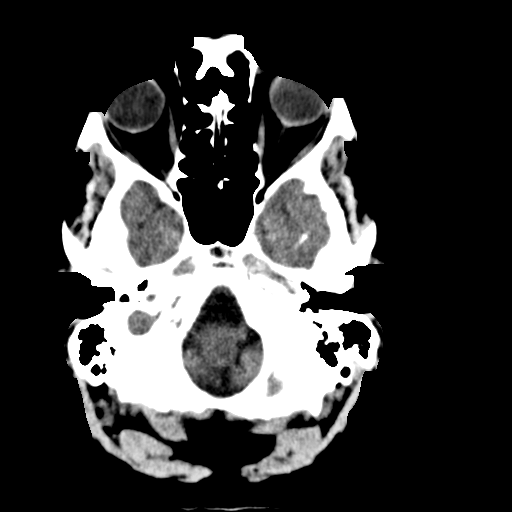
[im 7/36  brain]
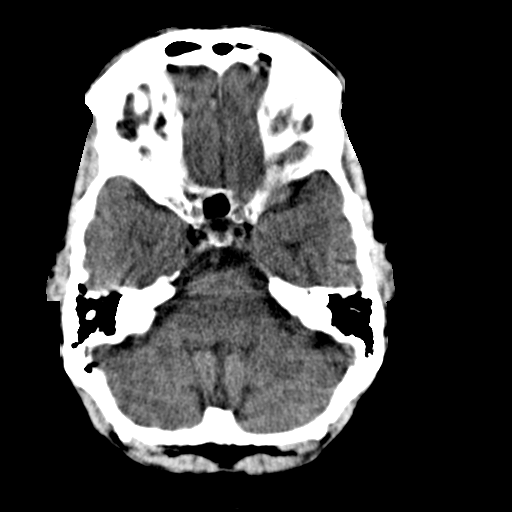
[im 9/36  brain]
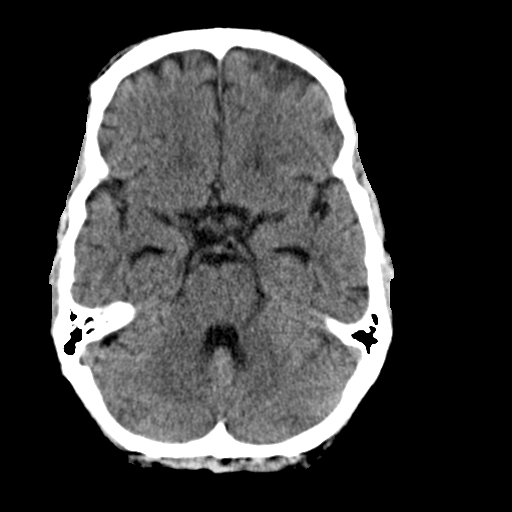
[im 10/36  brain]
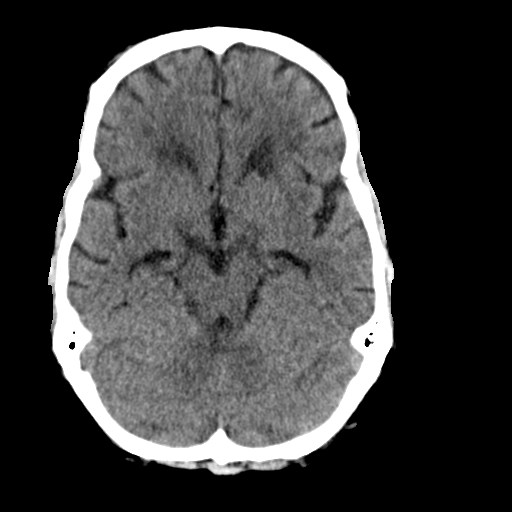
[im 10/36  bone]
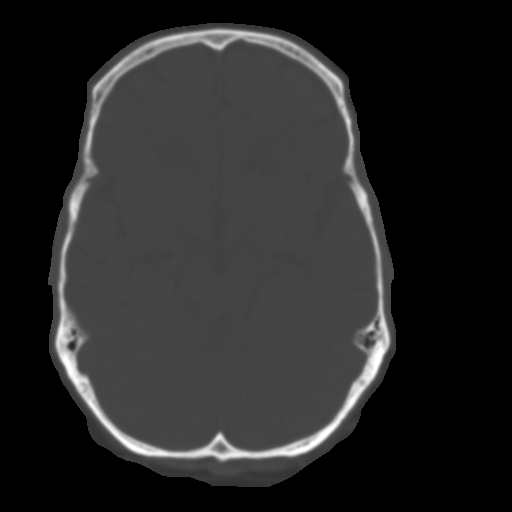
[im 13/36  brain]
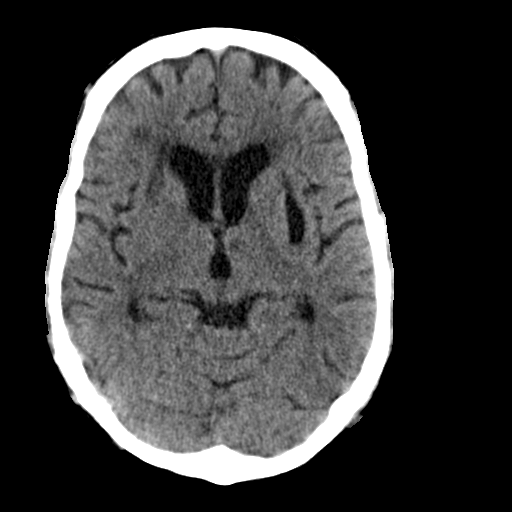
[im 15/36  brain]
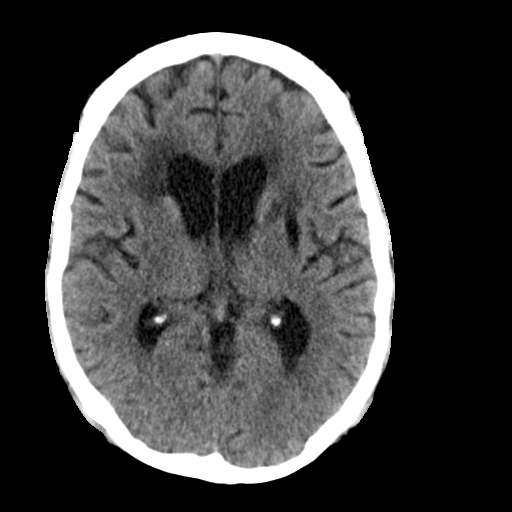
[im 17/36  brain]
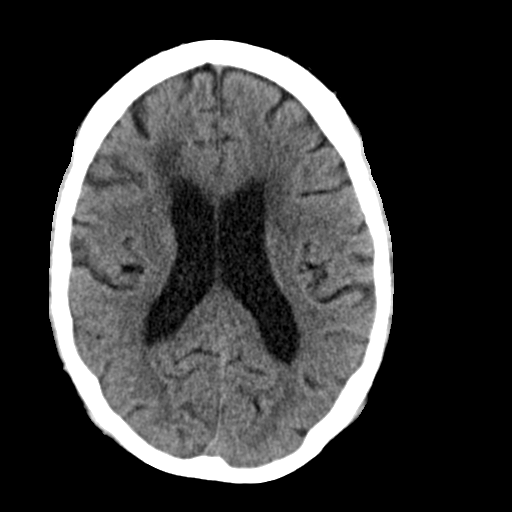
[im 19/36  brain]
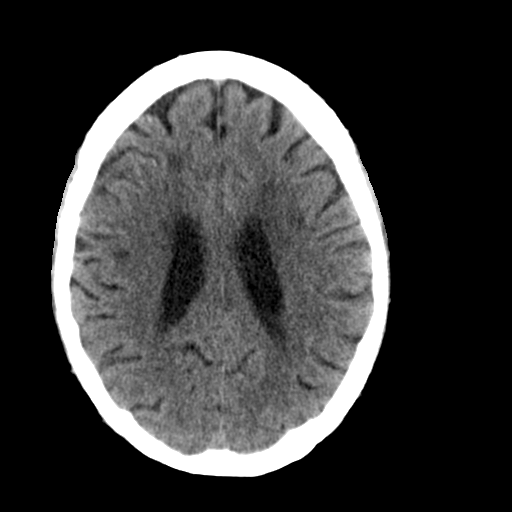
[im 19/36  bone]
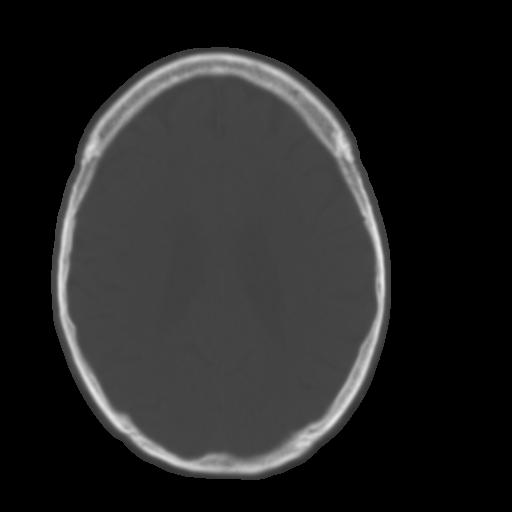
[im 21/36  brain]
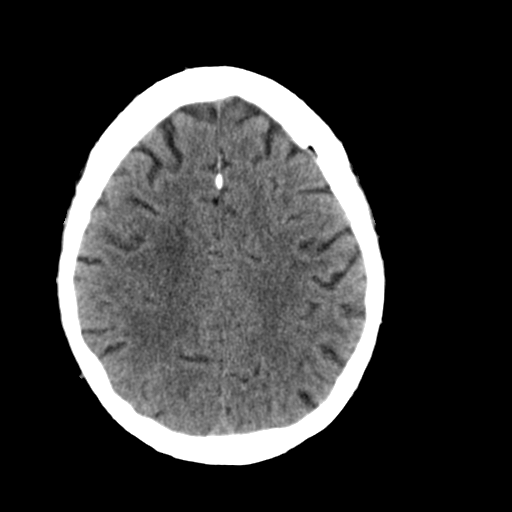
[im 23/36  brain]
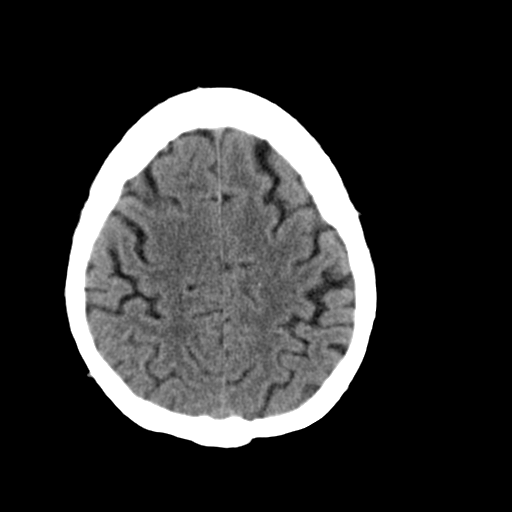
[im 26/36  brain]
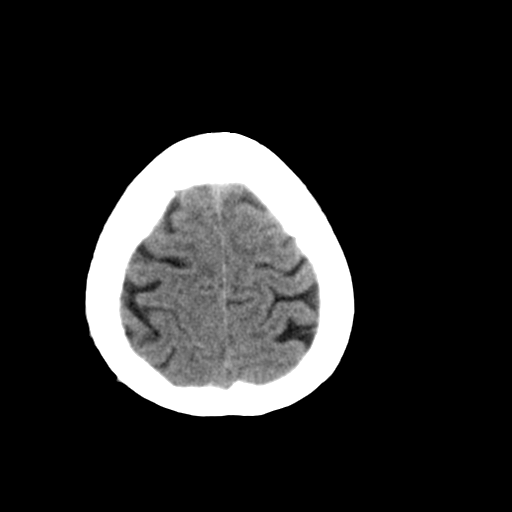
[im 27/36  brain]
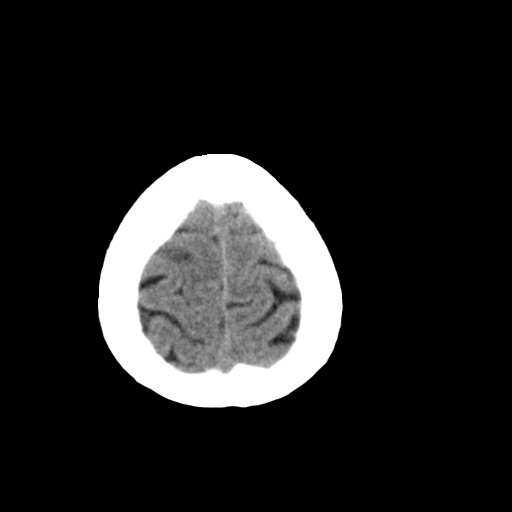
[im 27/36  bone]
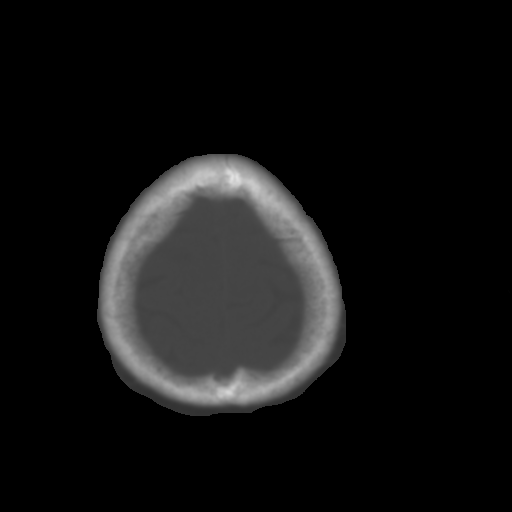
[im 29/36  brain]
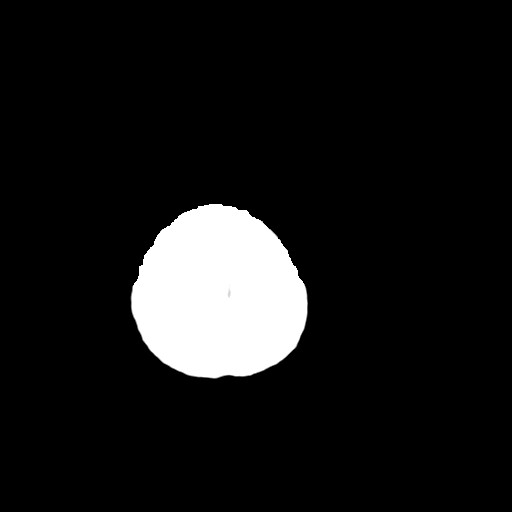
[im 32/36  brain]
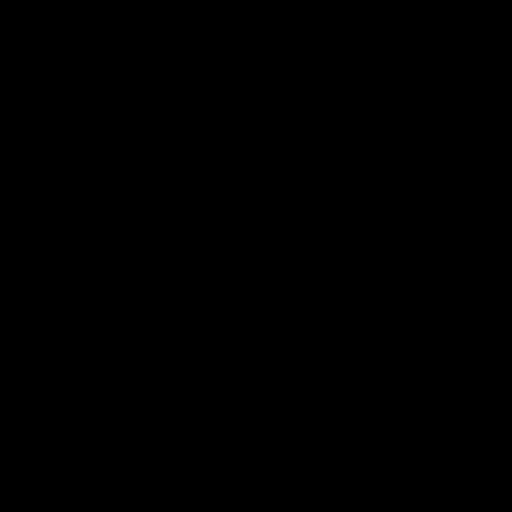
[im 34/36  brain]
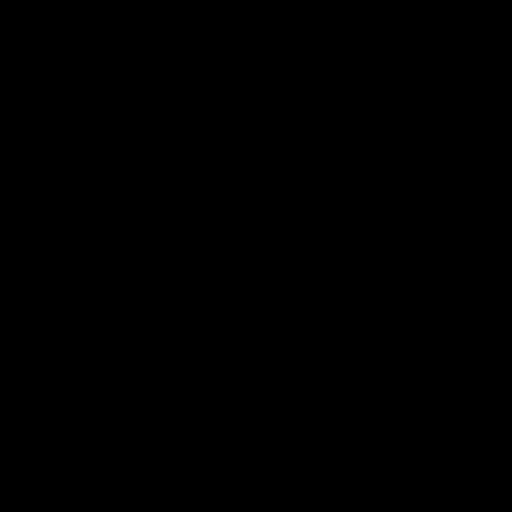

[16 of 30 positions shown; findings below may reference images not displayed]

FINDINGS: Multiple bilateral basal ganglia lacunar infarcts. Extensive
low-density throughout the deep white matter compatible with chronic
small vessel disease. No acute intracranial abnormality.
Specifically, no hemorrhage, hydrocephalus, mass lesion, acute
infarction, or significant intracranial injury. No acute calvarial
abnormality. Visualized paranasal sinuses and mastoids clear.
Orbital soft tissues unremarkable.
IMPRESSION: Multiple old bilateral basal ganglia lacunar infarcts.

Chronic small vessel disease.

No acute intracranial abnormality.

## 2016-09-27 ENCOUNTER — Ambulatory Visit (INDEPENDENT_AMBULATORY_CARE_PROVIDER_SITE_OTHER): Payer: Medicare Other | Admitting: Orthopedic Surgery

## 2016-09-27 ENCOUNTER — Encounter (INDEPENDENT_AMBULATORY_CARE_PROVIDER_SITE_OTHER): Payer: Self-pay | Admitting: Orthopedic Surgery

## 2016-09-27 ENCOUNTER — Ambulatory Visit (INDEPENDENT_AMBULATORY_CARE_PROVIDER_SITE_OTHER): Payer: Medicare Other

## 2016-09-27 VITALS — BP 154/66 | HR 80 | Resp 14 | Ht 68.0 in | Wt 145.0 lb

## 2016-09-27 DIAGNOSIS — M7541 Impingement syndrome of right shoulder: Secondary | ICD-10-CM | POA: Diagnosis not present

## 2016-09-27 DIAGNOSIS — M25511 Pain in right shoulder: Secondary | ICD-10-CM

## 2016-09-27 DIAGNOSIS — M19011 Primary osteoarthritis, right shoulder: Secondary | ICD-10-CM

## 2016-09-27 DIAGNOSIS — G8929 Other chronic pain: Secondary | ICD-10-CM | POA: Diagnosis not present

## 2016-09-27 MED ORDER — METHYLPREDNISOLONE ACETATE 40 MG/ML IJ SUSP
80.0000 mg | INTRAMUSCULAR | Status: AC | PRN
  Administered 2016-09-27: 80 mg

## 2016-09-27 MED ORDER — BUPIVACAINE HCL 0.5 % IJ SOLN
2.0000 mL | INTRAMUSCULAR | Status: AC | PRN
  Administered 2016-09-27: 2 mL via INTRA_ARTICULAR

## 2016-09-27 MED ORDER — DICLOFENAC SODIUM 1 % TD GEL: 2.0000 g | Freq: Four times a day (QID) | TRANSDERMAL | 2 refills | Status: DC

## 2016-09-27 MED ORDER — LIDOCAINE HCL 1 % IJ SOLN
3.0000 mL | INTRAMUSCULAR | Status: AC | PRN
  Administered 2016-09-27: 3 mL

## 2016-09-27 NOTE — Progress Notes (Signed)
Office Visit Note   Patient: Eric Gill           Date of Birth: 01/19/1951           MRN: [id number] Visit Date: 09/27/2016              Requested by: Idelle Crouch, MD [facility] [place],  [id number] PCP: Idelle Crouch, MD   Assessment & Plan: Visit Diagnoses:  1. Impingement syndrome of right shoulder   2. Chronic right shoulder pain   3. Arthritis of right acromioclavicular joint   4.      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   Follow-Up Instructions: Return if symptoms worsen or fail to improve.   Orders:  Orders Placed This Encounter  Procedures  . XR Shoulder Right   Meds ordered this encounter  Medications  . diclofenac sodium (VOLTAREN) 1 % GEL    Sig: Apply 2 g topically 4 (four) times daily.    Dispense:  5 Tube    Refill:  2    Order Specific Question:   Supervising Provider    Answer:   Garald Balding [[id number]]      Procedures: Large Joint Inj Date/Time: 09/27/2016 3:15 PM Performed by: Biagio Borg D Authorized by: Biagio Borg D   Consent Given by:  Patient Timeout: prior to procedure the correct patient, procedure, and site was verified   Indications:  Pain Location:  Shoulder Site:  R subacromial bursa Prep: patient was prepped and draped in usual sterile fashion   Needle Size:  25 G Needle Length:  1.5 inches Approach:  Lateral Ultrasound Guidance: No   Fluoroscopic Guidance: No   Arthrogram: No   Medications:  80 mg methylPREDNISolone acetate 40 MG/ML; 2 mL bupivacaine 0.5 %; 3 mL lidocaine 1 % Aspiration Attempted: No   Patient tolerance:  Patient tolerated the procedure well with no immediate complications     Clinical Data: No additional findings.   Subjective: Chief Complaint  Patient  presents with  . Right Shoulder - Pain    Eric Gill is a 66 y o that presents with chronic R shoulder pain x 6 months.Limited ROM laterally, overhead arm raise hurts but can perform. Hx of DVT in leg, takes coumadin    Eric Gill is a very pleasant 66 year old white male who is seen today for evaluation of his right shoulder. He does have a history of right shoulder problems dating back to July 2016. Apparently he was at a yard sale and was testing out a bike when he fell off landing on his elbow and jamming the humeral head up into the shoulder. He was having pain discomfort and noted loss of range of motion. He was working with a Clinical research associate and they noticed weakness in the shoulder secondary to his pain and discomfort. He did back off on some of his exercises. Was told to continue to have symptoms especially at nighttime. A subacromial injection was given and he did not return until this office visit.  Today he was complaining of pain in the right shoulder as well as difficulty with the range of motion and strength. He denies any recent history of injury or trauma. Pain is centered around the shoulder. Seen today for evaluation.    Review  of Systems  Constitutional: Negative for fatigue.  HENT: Negative for hearing loss.   Respiratory: Negative for apnea, chest tightness and shortness of breath.   Cardiovascular: Positive for leg swelling. Negative for chest pain and palpitations.  Gastrointestinal: Negative for blood in stool, constipation and diarrhea.  Genitourinary: Negative for difficulty urinating.  Musculoskeletal: Positive for back pain, neck pain and neck stiffness. Negative for arthralgias, joint swelling and myalgias.  Neurological: Negative for weakness, numbness and headaches.  Hematological: Bruises/bleeds easily.  Psychiatric/Behavioral: Positive for sleep disturbance. The patient is not nervous/anxious.      Objective: Vital Signs: BP (!) 154/66   Pulse 80   Resp 14   Ht 5'  8" (1.727 m)   Wt 145 lb (65.8 kg)   BMI 22.05 kg/m   Physical Exam  Constitutional: He is oriented to person, place, and time. He appears well-developed and well-nourished.  HENT:  Head: Normocephalic and atraumatic.  Eyes: Pupils are equal, round, and reactive to light. EOM are normal.  Pulmonary/Chest: Effort normal.  Neurological: He is alert and oriented to person, place, and time.  Skin: Skin is warm and dry.  Psychiatric: He has a normal mood and affect. His behavior is normal. Judgment and thought content normal.    Ortho Exam  Today he has a painful arc. He has abduction to about 120. Forward flexion to about 135.  Specialty Comments:  No specialty comments available.  Imaging: Xr Shoulder Right  Result Date: 09/27/2016 4 x-ray of the right shoulder reveals elevation of the humeral head with a type 2-3 acromion maintaining a joint space on the axillary lateral that there is some periarticular spurring noted on the glenoid. Acromioclavicular arthritis is noted    PMFS History: Patient Active Problem List   Diagnosis Date Noted  . GI bleed 02/23/2015  . Acute blood loss anemia 02/23/2015   Past Medical History:  Diagnosis Date  . Anemia   . Antiphospholipid antibody syndrome ([place])   . Arthritis   . DVT (deep venous thrombosis) ([place])   . Essential hypertension   . Gastric ulcer   . GERD (gastroesophageal reflux disease)   . History of CMV   . Hyperlipidemia   . Hyperparathyroidism ([place])   . Pedal edema   . Peripheral vascular disease ([place])   . Rosacea   . Status post kidney transplant 2001  . TIA (transient ischemic attack)   . 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 No pertinent family history.  Past Surgical History:  Procedure Laterality Date  . COLONOSCOPY WITH PROPOFOL N/A 03/06/2015   Procedure: COLONOSCOPY WITH PROPOFOL;  Surgeon: Josefine Class, MD;  Location: [facility] ENDOSCOPY;  Service: Endoscopy;  Laterality:  N/A;  . ESOPHAGOGASTRODUODENOSCOPY Left 03/01/2015   Procedure: ESOPHAGOGASTRODUODENOSCOPY (EGD);  Surgeon: Hulen Luster, MD;  Location: [facility] ENDOSCOPY;  Service: Endoscopy;  Laterality: Left;  . ESOPHAGOGASTRODUODENOSCOPY (EGD) WITH PROPOFOL  03/06/2015   Procedure: ESOPHAGOGASTRODUODENOSCOPY (EGD) WITH PROPOFOL;  Surgeon: Josefine Class, MD;  Location: [facility] ENDOSCOPY;  Service: Endoscopy;;  . ESOPHAGOGASTRODUODENOSCOPY (EGD) WITH PROPOFOL N/A 05/25/2015   Procedure: ESOPHAGOGASTRODUODENOSCOPY (EGD) WITH PROPOFOL;  Surgeon: Josefine Class, MD;  Location: [facility] ENDOSCOPY;  Service: Endoscopy;  Laterality: N/A;  . KIDNEY TRANSPLANT     Social History   Occupational History  . Not on file.   Social History Main Topics  . Smoking status: Former Research scientist (life sciences)  . Smokeless tobacco: Never Used  . Alcohol use No  . Drug use: No  .  Sexual activity: Not on file

## 2016-11-08 ENCOUNTER — Ambulatory Visit
Admission: RE | Admit: 2016-11-08 | Discharge: 2016-11-08 | Disposition: A | Discharge status: Home / Self Care | Payer: Medicare Other | Source: Ambulatory Visit | Attending: Specialist | Admitting: Specialist

## 2016-11-08 DIAGNOSIS — J432 Centrilobular emphysema: Secondary | ICD-10-CM | POA: Diagnosis not present

## 2016-11-08 DIAGNOSIS — R918 Other nonspecific abnormal finding of lung field: Secondary | ICD-10-CM | POA: Insufficient documentation

## 2016-11-08 DIAGNOSIS — R911 Solitary pulmonary nodule: Secondary | ICD-10-CM

## 2016-11-13 ENCOUNTER — Emergency Department
Admission: EM | Admit: 2016-11-13 | Discharge: 2016-11-13 | Discharge status: Against Medical Advice | Payer: Medicare Other | Attending: Emergency Medicine | Admitting: Emergency Medicine

## 2016-11-13 ENCOUNTER — Emergency Department: Payer: Medicare Other

## 2016-11-13 DIAGNOSIS — H539 Unspecified visual disturbance: Secondary | ICD-10-CM | POA: Insufficient documentation

## 2016-11-13 DIAGNOSIS — G459 Transient cerebral ischemic attack, unspecified: Secondary | ICD-10-CM | POA: Insufficient documentation

## 2016-11-13 DIAGNOSIS — R410 Disorientation, unspecified: Secondary | ICD-10-CM

## 2016-11-13 DIAGNOSIS — R4182 Altered mental status, unspecified: Secondary | ICD-10-CM | POA: Diagnosis present

## 2016-11-13 LAB — URINALYSIS, ROUTINE W REFLEX MICROSCOPIC
Bacteria, UA: NONE SEEN
Bilirubin Urine: NEGATIVE
Glucose, UA: NEGATIVE mg/dL
Hgb urine dipstick: NEGATIVE
Ketones, ur: NEGATIVE mg/dL
Nitrite: NEGATIVE
PH: 5 (ref 5.0–8.0)
Protein, ur: 30 mg/dL — AB
SPECIFIC GRAVITY, URINE: 1.015 (ref 1.005–1.030)

## 2016-11-13 LAB — COMPREHENSIVE METABOLIC PANEL
ALBUMIN: 3.7 g/dL (ref 3.5–5.0)
ALK PHOS: 80 U/L (ref 38–126)
ALT: 17 U/L (ref 17–63)
ANION GAP: 11 (ref 5–15)
AST: 23 U/L (ref 15–41)
BUN: 32 mg/dL — ABNORMAL HIGH (ref 6–20)
CALCIUM: 9.6 mg/dL (ref 8.9–10.3)
CO2: 23 mmol/L (ref 22–32)
Chloride: 108 mmol/L (ref 101–111)
Creatinine, Ser: 3.02 mg/dL — ABNORMAL HIGH (ref 0.61–1.24)
GFR calc Af Amer: 23 mL/min — ABNORMAL LOW (ref >60)
GFR calc non Af Amer: 20 mL/min — ABNORMAL LOW (ref >60)
GLUCOSE: 92 mg/dL (ref 65–99)
Potassium: 3.7 mmol/L (ref 3.5–5.1)
SODIUM: 142 mmol/L (ref 135–145)
Total Bilirubin: 0.5 mg/dL (ref 0.3–1.2)
Total Protein: 7 g/dL (ref 6.5–8.1)

## 2016-11-13 LAB — URINE DRUG SCREEN, QUALITATIVE (ARMC ONLY)
AMPHETAMINES, UR SCREEN: NOT DETECTED
Barbiturates, Ur Screen: NOT DETECTED
Benzodiazepine, Ur Scrn: NOT DETECTED
Cannabinoid 50 Ng, Ur ~~LOC~~: NOT DETECTED
Cocaine Metabolite,Ur ~~LOC~~: NOT DETECTED
MDMA (ECSTASY) UR SCREEN: NOT DETECTED
Methadone Scn, Ur: NOT DETECTED
OPIATE, UR SCREEN: NOT DETECTED
PHENCYCLIDINE (PCP) UR S: NOT DETECTED
Tricyclic, Ur Screen: NOT DETECTED

## 2016-11-13 LAB — APTT: aPTT: 39 seconds — ABNORMAL HIGH (ref 24–36)

## 2016-11-13 LAB — CBC WITH DIFFERENTIAL/PLATELET
BASOS PCT: 1 %
Basophils Absolute: 0 10*3/uL (ref 0–0.1)
Eosinophils Absolute: 0 10*3/uL (ref 0–0.7)
Eosinophils Relative: 0 %
HEMATOCRIT: 34 % — AB (ref 40.0–52.0)
Hemoglobin: 11.4 g/dL — ABNORMAL LOW (ref 13.0–18.0)
LYMPHS ABS: 1.2 10*3/uL (ref 1.0–3.6)
Lymphocytes Relative: 18 %
MCH: 29.5 pg (ref 26.0–34.0)
MCHC: 33.5 g/dL (ref 32.0–36.0)
MCV: 88 fL (ref 80.0–100.0)
MONO ABS: 0.8 10*3/uL (ref 0.2–1.0)
MONOS PCT: 12 %
NEUTROS ABS: 4.5 10*3/uL (ref 1.4–6.5)
Neutrophils Relative %: 69 %
Platelets: 165 10*3/uL (ref 150–440)
RBC: 3.86 MIL/uL — ABNORMAL LOW (ref 4.40–5.90)
RDW: 14.3 % (ref 11.5–14.5)
WBC: 6.5 10*3/uL (ref 3.8–10.6)

## 2016-11-13 LAB — PROTIME-INR
INR: 1.95
Prothrombin Time: 22.1 seconds — ABNORMAL HIGH (ref 11.4–15.2)

## 2016-11-13 LAB — ETHANOL

## 2016-11-13 NOTE — ED Notes (Signed)
PO challenge performed per MD Karma Greaser.  No issues/cough with fluid/cracker. No changes to lung sounds heard. MD Karma Greaser informed.

## 2016-11-13 NOTE — Progress Notes (Signed)
Chaplain responded to a Code Stroke Page for pt in ED04. [place] met pt and his family bedside. Pt was alert and coherent. Pt talked about his health struggles, his marriage, his family, and other things that he valued. [place] listened to pt, offered prayer and pastoral support to pt and his family. [place] will follow up pt as needed.   11/13/16 0200  Clinical Encounter Type  Visited With Patient and family together  Visit Type Initial;Spiritual support;Code;ED;Trauma  Referral From Nurse  Consult/Referral To Chaplain  Spiritual Encounters  Spiritual Needs Prayer;Other (Comment)

## 2016-11-13 NOTE — ED Notes (Signed)
CODE STROKE CALLED TO 333 

## 2016-11-13 NOTE — ED Notes (Addendum)
Patient c/o vision right eye, confusion, and tongue deviation.  Symptoms started within the hour. Patient was just recently discharged from [place], having been seen for sepsis.

## 2016-11-13 NOTE — ED Provider Notes (Signed)
[facility] Emergency Department Provider Note  ____________________________________________   First MD Initiated Contact with Patient 11/13/16 0028     (approximate)  I have reviewed the triage vital signs and the nursing notes.   HISTORY  Chief Complaint Altered Mental Status    HPI Eric Gill is a 66 y.o. 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  He is still on antibiotics.  He presents tonight approximately one hour and 15 minutes after acute onset of confusion (he did not know where his bathroom was) and acute right sided visual loss.  His wife also reports that his tongue was deviated when she asked him to stick it out straight.  His symptoms are already improving; he reports that he still has some visual impairment in the right field of vision, but he is no longer confused and has no weakness nor numbness nor word finding issues.  He has certainly been through a lot recently but does not report any specific symptoms at this time other than those described above.  He denies fever/chills, chest pain, shortness of breath, nausea, vomiting, and abdominal pain.  Even when his symptoms were at their worst, he was still ambulatory without any balance difficulties.  His wife and daughter report that he does have some mild vascular dementia but he is not typically this confused about simple things like the location of the bathroom in his house.   Past Medical History:  Diagnosis Date  . Anemia   . Antiphospholipid antibody syndrome ([place])   . Arthritis   . DVT (deep venous thrombosis) ([place])   . Essential hypertension   . Gastric ulcer   . GERD (gastroesophageal reflux disease)   . History of CMV   . Hyperlipidemia    . Hyperparathyroidism ([place])   . Pedal edema   . Peripheral vascular disease ([place])   . Rosacea   . Status post kidney transplant 2001  . TIA (transient ischemic attack)   . Vascular dementia without behavioral disturbance     Patient Active Problem List   Diagnosis Date Noted  . GI bleed 02/23/2015  . Acute blood loss anemia 02/23/2015    Past Surgical History:  Procedure Laterality Date  . COLONOSCOPY WITH PROPOFOL N/A 03/06/2015   Procedure: COLONOSCOPY WITH PROPOFOL;  Surgeon: Josefine Class, MD;  Location: [facility] ENDOSCOPY;  Service: Endoscopy;  Laterality: N/A;  . ESOPHAGOGASTRODUODENOSCOPY Left 03/01/2015   Procedure: ESOPHAGOGASTRODUODENOSCOPY (EGD);  Surgeon: Hulen Luster, MD;  Location: [facility] ENDOSCOPY;  Service: Endoscopy;  Laterality: Left;  . ESOPHAGOGASTRODUODENOSCOPY (EGD) WITH PROPOFOL  03/06/2015   Procedure: ESOPHAGOGASTRODUODENOSCOPY (EGD) WITH PROPOFOL;  Surgeon: Josefine Class, MD;  Location: [facility] ENDOSCOPY;  Service: Endoscopy;;  . ESOPHAGOGASTRODUODENOSCOPY (EGD) WITH PROPOFOL N/A 05/25/2015   Procedure: ESOPHAGOGASTRODUODENOSCOPY (EGD) WITH PROPOFOL;  Surgeon: Josefine Class, MD;  Location: [facility] ENDOSCOPY;  Service: Endoscopy;  Laterality: N/A;  . KIDNEY TRANSPLANT      Prior to Admission medications   Medication Sig Start Date End Date Taking? Authorizing Provider  amLODipine (NORVASC) 10 MG tablet Take 10 mg by mouth daily.   Yes [provider]  budesonide-formoterol (SYMBICORT) 80-4.5 MCG/ACT inhaler Inhale 2 puffs into the lungs 2 (two) times daily.   Yes [provider]  buPROPion (WELLBUTRIN XL) 150  MG 24 hr tablet TAKE ONE TABLET BY MOUTH EVERY DAY 06/16/16  Yes [provider]  calcitRIOL (ROCALTROL) 0.25 MCG capsule Take 0.25 mcg by mouth at bedtime.   Yes [provider]  citalopram (CELEXA) 20 MG tablet Take 20 mg by mouth daily.   Yes [provider]  diphenoxylate-atropine (LOMOTIL) 2.5-0.025 MG  tablet Take 1 tablet by mouth 4 (four) times daily as needed for diarrhea or loose stools.   Yes [provider]  donepezil (ARICEPT) 10 MG tablet Take 10 mg by mouth at bedtime.   Yes [provider]  labetalol (NORMODYNE) 200 MG tablet Take 200 mg by mouth 2 (two) times daily.    Yes [provider]  magnesium oxide (MAG-OX) 400 MG tablet Take 400 mg by mouth daily.  02/11/16 02/10/17 Yes [provider]  Multiple Vitamin (MULTIVITAMIN WITH MINERALS) TABS tablet Take 1 tablet by mouth daily.   Yes [provider]  mycophenolate (CELLCEPT) 250 MG capsule Take 250 mg by mouth 2 (two) times daily.   Yes [provider]  pantoprazole (PROTONIX) 40 MG tablet Take 1 tablet (40 mg total) by mouth 2 (two) times daily before a meal. 03/06/15  Yes Epifanio Lesches, MD  predniSONE (DELTASONE) 20 MG tablet Take 1 tablet (20 mg total) by mouth daily with breakfast. Patient taking differently: Take 5 mg by mouth daily with breakfast.  03/08/15  Yes Epifanio Lesches, MD  sulfamethoxazole-trimethoprim (BACTRIM DS,SEPTRA DS) 800-160 MG tablet Take 1 tablet by mouth daily.   Yes [provider]  tacrolimus (PROGRAF) 0.5 MG capsule TAKE ONE CAPSULE BY MOUTH TWICE A DAY. 12/27/15 12/26/16 Yes [provider]  tamsulosin (FLOMAX) 0.4 MG CAPS capsule Take 0.4 mg by mouth daily.   Yes [provider]  warfarin (COUMADIN) 2 MG tablet Take 2 mg by mouth daily at 6 PM.   Yes [provider]  diclofenac sodium (VOLTAREN) 1 % GEL Apply 2 g topically 4 (four) times daily. Patient not taking: Reported on 11/13/2016 09/27/16   Biagio Borg D, PA-C  sodium bicarbonate 650 MG tablet Take 1 tablet (650 mg total) by mouth 3 (three) times daily. Patient not taking: Reported on 09/27/2016 03/08/15   Epifanio Lesches, MD  sucralfate (CARAFATE) 1 GM/10ML suspension Take 10 mLs (1 g total) by mouth every 6 (six) hours. Patient not taking:  Reported on 09/27/2016 03/07/15   Epifanio Lesches, MD  warfarin (COUMADIN) 3 MG tablet Take 1 tablet (3 mg total) by mouth daily at 6 PM. Patient not taking: Reported on 11/13/2016 03/08/15   Epifanio Lesches, MD    Allergies Statins  No family history on file.  Social History Social History  Substance Use Topics  . Smoking status: Former Research scientist (life sciences)  . Smokeless tobacco: Never Used  . Alcohol use No    Review of Systems Constitutional: No fever/chills Eyes: Acute loss of right field of vision about 1 hour and 15 minutes prior to arrival ENT: No sore throat. Cardiovascular: Denies chest pain. Respiratory: Denies shortness of breath. Gastrointestinal: No abdominal pain.  No nausea, no vomiting.  No diarrhea.  No constipation. Genitourinary: Negative for dysuria. Musculoskeletal: Negative for neck pain.  Negative for back pain. Integumentary: Negative for rash. Neurological: Acute onset about 75 minutes ago of loss of right field of vision, confusion, and tongue deviation to the right   ____________________________________________   PHYSICAL EXAM:  VITAL SIGNS: ED Triage Vitals  Enc Vitals Group     BP 11/13/16 0036 [name])  174/86     Pulse Rate 11/13/16 0036 64     Resp 11/13/16 0036 18     Temp 11/13/16 0038 98.2 F (36.8 C)     Temp Source 11/13/16 0038 Oral     SpO2 11/13/16 0036 98 %     Weight --      Height --      Head Circumference --      Peak Flow --      Pain Score --      Pain Loc --      Pain Edu? --      Excl. in [place]? --     Constitutional: Alert and oriented. Well appearing and in no acute distress. Eyes: Conjunctivae are normal. PERRL. EOMI. Head: Atraumatic. Nose: No congestion/rhinnorhea. Mouth/Throat: Mucous membranes are moist. Neck: No stridor.  No meningeal signs.   Cardiovascular: Normal rate, regular rhythm. Good peripheral circulation. Grossly normal heart sounds. Respiratory: Normal respiratory effort.  No retractions. Lungs  CTAB. Gastrointestinal: Soft and nontender. No distention.  Musculoskeletal: No lower extremity tenderness nor edema. No gross deformities of extremities. Neurologic:  Normal speech and language. No gross focal neurologic deficits are appreciated.  The patient self reports decreased vision in the right field of vision but still is able to appreciate extremes of visual fields.  See below for NIH stroke scale Skin:  Skin is warm, dry and intact. No rash noted. Psychiatric: Mood and affect are normal. Speech and behavior are normal.  ____________________________________________   LABS (all labs ordered are listed, but only abnormal results are displayed)  Labs Reviewed  COMPREHENSIVE METABOLIC PANEL - Abnormal; Notable for the following:       Result Value   BUN 32 (*)    Creatinine, Ser 3.02 (*)    GFR calc non Af Amer 20 (*)    GFR calc Af Amer 23 (*)    All other components within normal limits  CBC WITH DIFFERENTIAL/PLATELET - Abnormal; Notable for the following:    RBC 3.86 (*)    Hemoglobin 11.4 (*)    HCT 34.0 (*)    All other components within normal limits  PROTIME-INR - Abnormal; Notable for the following:    Prothrombin Time 22.1 (*)    All other components within normal limits  APTT - Abnormal; Notable for the following:    aPTT 39 (*)    All other components within normal limits  URINALYSIS, ROUTINE W REFLEX MICROSCOPIC - Abnormal; Notable for the following:    Color, Urine YELLOW (*)    APPearance HAZY (*)    Protein, ur 30 (*)    Leukocytes, UA SMALL (*)    Squamous Epithelial / LPF 0-5 (*)    All other components within normal limits  URINE DRUG SCREEN, QUALITATIVE ([facility] ONLY)  ETHANOL   ____________________________________________  EKG  ED ECG REPORT I, Asusena Sigley, the attending physician, personally viewed and interpreted this ECG.  Date: 11/13/2016 EKG Time: 00:25 Rate: 62 Rhythm: Sinus versus junctional rhythm QRS Axis: normal Intervals:  normal ST/T Wave abnormalities: Non-specific ST segment / T-wave changes, but no evidence of acute ischemia. Narrative Interpretation: no evidence of acute ischemia   ____________________________________________  RADIOLOGY   Ct Head Wo Contrast  Result Date: 11/13/2016 CLINICAL DATA:  LEFT-sided facial weakness and LEFT side facial droop for 30 minutes, history of stroke, essential hypertension, renal transplant EXAM: CT HEAD WITHOUT CONTRAST TECHNIQUE: Contiguous axial images were obtained from the base of the skull through the vertex  without intravenous contrast. COMPARISON:  10/29/2014 FINDINGS: Brain: Generalized atrophy. Normal ventricular morphology. No midline shift or mass effect. Small vessel chronic ischemic changes of deep cerebral white matter. Large old LEFT external capsule infarct. Old infarct at anterior limb RIGHT internal capsule. Small old lacunar infarct at LEFT caudate head. Questionable tiny old thalamic infarcts. No intracranial hemorrhage, mass lesion, evidence of acute infarction, or extra-axial fluid collection. Vascular: Minimal atherosclerotic calcifications at skullbase Skull: Osseous demineralization.  Calvaria intact. Sinuses/Orbits: Partial opacification of LEFT frontal sinus and sphenoid sinus. Other: N/A IMPRESSION: Atrophy with small vessel chronic ischemic changes of deep cerebral white matter. Multiple old lacunar infarcts. No acute intracranial abnormalities. Electronically Signed   By: Lavonia Dana M.D.   On: 11/13/2016 00:26    ____________________________________________   PROCEDURES  Critical Care performed: Yes, see critical care procedure note(s)   Procedure(s) performed:   .Critical Care Performed by: Hinda Kehr Authorized by: Hinda Kehr   Critical care provider statement:    Critical care time (minutes):  30   Critical care time was exclusive of:  Separately billable procedures and treating other patients   Critical care was necessary  to treat or prevent imminent or life-threatening deterioration of the following conditions:  CNS failure or compromise   Critical care was time spent personally by me on the following activities:  Development of treatment plan with patient or surrogate, discussions with consultants, evaluation of patient's response to treatment, examination of patient, obtaining history from patient or surrogate, ordering and performing treatments and interventions, ordering and review of laboratory studies, ordering and review of radiographic studies, pulse oximetry, re-evaluation of patient's condition and review of old charts      ____________________________________________   INITIAL IMPRESSION / [place] / ED COURSE  As part of my medical decision making, I reviewed the following data within the electronic MEDICAL RECORD NUMBER History obtained from family, Nursing notes reviewed and incorporated, Labs reviewed, EKG interpreted, Old chart reviewed and A consult was requested and obtained from this/these consultant(s) tele-neurology    NIH Stroke Scale  Interval: Baseline Time: 00:30 Person Administering Scale: Zayana Salvador  Administer stroke scale items in the order listed. Record performance in each category after each subscale exam. Do not go back and change scores. Follow directions provided for each exam technique. Scores should reflect what the patient does, not what the clinician thinks the patient can do. The clinician should record answers while administering the exam and work quickly. Except where indicated, the patient should not be coached (i.e., repeated requests to patient to make a special effort).   1a  Level of consciousness: 0=alert; keenly responsive  1b. LOC questions:  0=Performs both tasks correctly  1c. LOC commands: 0=Performs both tasks correctly  2.  Best Gaze: 0=normal  3.  Visual: 1=Partial hemianopia  4. Facial Palsy: 0=Normal symmetric movement  5a.  Motor left  arm: 0=No drift, limb holds 90 (or 45) degrees for full 10 seconds  5b.  Motor right arm: 0=No drift, limb holds 90 (or 45) degrees for full 10 seconds  6a. motor left leg: 0=No drift, limb holds 90 (or 45) degrees for full 10 seconds  6b  Motor right leg:  0=No drift, limb holds 90 (or 45) degrees for full 10 seconds  7. Limb Ataxia: 0=Absent  8.  Sensory: 0=Normal; no sensory loss  9. Best Language:  0=No aphasia, normal  10. Dysarthria: 0=Normal  11. Extinction and Inattention: 0=No abnormality  12. Distal motor function:  0=Normal   Total:   1     Clinical Course as of Nov 14 422  Nancy Fetter Nov 13, 2016  0043 Asked secretary to call for urgent possible stroke teleneuro consult  [CF]  0133 Because the patient is not a candidate for TPA and I did not originally called this a code stroke.  However I was just informed that in order to obtain a timely neurology consultation, it needs to be called a code stroke, so I have asked the secretary to do so.  [CF]  0153 of note, the patient's symptoms have completely resolved and he is reporting a normal visual field at this time.  His labs are notable for an INR of 1.95 which is slightly below his goal.  no leukocytosis, metabolic panel is unremarkable except for a creatinine of 3 which is right around his baseline according to old lab reports from [facility] that I reviewed.his head CT report indicates he has multiple old ischemic changes but no evidence of acute abnormality.  [CF]  [id number] I spoke by phone with the specialist on-call neurologist and I reviewed his written report.  He believes the patient had a TIA and he recommends admission, MRI, and the typical stroke workup.  I then discussed the case extensively with the patient and his family.  They are very clear that they do not want him to stand the hospital and the patient agrees.  His symptoms have resolved and he states that he feels fine.  He has been through this in the past with CVA/TIA and he is already  anticoagulated on warfarin, although I pointed out that he is subtherapeutic.  We discussed the morbidity and mortality associated with having another CVA which could lead to permanent disability or even death, and they understand this.  I trust that they all have good insight and judgment into this situation, but given all that the patient has been through they do not want admission tonight.  I did also offer to admit him here or to call [facility] for a transfer, but they are still comfortable with the plan to go home [place].  Because of the fact that he coughed after the initial swallow screen, we are going to try a PO challenge.  If he is able to eat and drink without coughing or choking, I will discharge him [place].  If not, he will need to be admitted.  The patient and family agree with this plan.  [CF]  0423 The patient passed a by mouth challenge without difficulty.  I once again discussed with the patient and his wife and daughter, and they all agree that they would prefer that he go home [place].  This interaction was not at all contentious, and again, I believe that they do have good insight and judgment into the situation.  I gave strict return precautions.  [CF]    Clinical Course User Index [CF] Hinda Kehr, MD    ____________________________________________  FINAL CLINICAL IMPRESSION(S) / ED DIAGNOSES  Final diagnoses:  TIA (transient ischemic attack)  Change in vision  Confusion      Note:  This document was prepared using Dragon voice recognition software and may include unintentional dictation errors.    Hinda Kehr, MD 11/13/16 [phone]

## 2016-11-13 NOTE — ED Notes (Signed)
RN and MD reviewed with patient and family that patient is leaving against medical advice.  Patient and family verbalized understanding of potential complications and dangers of leaving AMA.   Reviewed follow-up care and signs of stroke and changes to neurological function with patient and family. Patient and family verbalized understanding.

## 2016-11-13 NOTE — ED Notes (Signed)
Patient reports visual changes have resolved. Aphasia has resolved upon RN exam. MD Karma Greaser informed.

## 2016-11-13 NOTE — Discharge Instructions (Signed)
As we discussed, we believe that your symptoms today are the result of a TIA, or many stroke.  We discussed at length the best plan of care, and I explained that I am concerned you may have another bigger stroke that could lead to permanent disability or even death.  My medical recommendation, and that of the neurologist, is that you stay in the hospital.  We understand that you prefer to go home and follow-up as an outpatient, but please understand that this is considered leaving against medical advice.  Please continue taking your regular medications as prescribed at the time of your recent discharge from [facility].  Follow up as soon as possible with your regular doctors.  Return to the emergency department if you develop new or worsening symptoms that concern you.

## 2016-11-13 NOTE — ED Notes (Signed)
RN to bedside for neurologist consult

## 2016-11-16 ENCOUNTER — Other Ambulatory Visit: Payer: Self-pay | Admitting: Internal Medicine

## 2016-11-16 ENCOUNTER — Ambulatory Visit
Admission: RE | Admit: 2016-11-16 | Discharge: 2016-11-16 | Disposition: A | Discharge status: Home / Self Care | Payer: Medicare Other | Source: Ambulatory Visit | Attending: Internal Medicine | Admitting: Internal Medicine

## 2016-11-16 DIAGNOSIS — G459 Transient cerebral ischemic attack, unspecified: Secondary | ICD-10-CM | POA: Insufficient documentation

## 2016-11-16 DIAGNOSIS — H5340 Unspecified visual field defects: Secondary | ICD-10-CM | POA: Diagnosis present

## 2016-11-16 DIAGNOSIS — I7389 Other specified peripheral vascular diseases: Secondary | ICD-10-CM | POA: Diagnosis not present

## 2016-11-18 ENCOUNTER — Other Ambulatory Visit: Payer: Self-pay | Admitting: Internal Medicine

## 2016-11-18 DIAGNOSIS — I639 Cerebral infarction, unspecified: Secondary | ICD-10-CM

## 2017-03-20 ENCOUNTER — Ambulatory Visit (INDEPENDENT_AMBULATORY_CARE_PROVIDER_SITE_OTHER): Payer: Medicare Other | Admitting: Orthopaedic Surgery

## 2017-03-20 ENCOUNTER — Encounter (INDEPENDENT_AMBULATORY_CARE_PROVIDER_SITE_OTHER): Payer: Self-pay | Admitting: Orthopaedic Surgery

## 2017-03-20 VITALS — BP 125/58 | HR 65 | Resp 14 | Ht 63.0 in | Wt 120.0 lb

## 2017-03-20 DIAGNOSIS — M25511 Pain in right shoulder: Secondary | ICD-10-CM

## 2017-03-20 DIAGNOSIS — G8929 Other chronic pain: Secondary | ICD-10-CM | POA: Diagnosis not present

## 2017-03-20 NOTE — Progress Notes (Signed)
Office Visit Note   Patient: Eric Gill           Date of Birth: 14-Feb-1950           MRN: [id number] Visit Date: 03/20/2017              Requested by: Idelle Crouch, MD [facility] [address], [place] [address] PCP: Idelle Crouch, MD   Assessment & Plan: Visit Diagnoses:  1. Chronic right shoulder pain     Plan: Persistent pain right shoulder was I rotator cuff tear. Had some relief with cortisone injection only to have recurrence over about a 3-4 week period. I think it's with obtaining an MRI scan at this point to rule out rotator cuff tear  Follow-Up Instructions: Return after MRI  right shoulder.   Orders:  Orders Placed This Encounter  Procedures  . MR SHOULDER RIGHT WO CONTRAST   No orders of the defined types were placed in this encounter.     Procedures: No procedures performed   Clinical Data: No additional findings.   Subjective: Chief Complaint  Patient presents with  . Right Shoulder - Pain  . Shoulder Pain    Right shoulder pain x 2 years, limited range of motion, getting MRI  Mr. pillow was list recently  seen in 2018 for evaluation of the right shoulder pain. Cortisone injection provided some relief for about 3-4 weeks only to have recurrence. He is experiencing pain with overhead motion to the point of compromise.  HPI  Review of Systems  Constitutional: Positive for activity change and fatigue.  HENT: Negative for trouble swallowing.   Eyes: Negative for pain.  Respiratory: Negative for shortness of breath.   Cardiovascular: Positive for leg swelling.  Gastrointestinal: Positive for diarrhea.  Endocrine: Positive for cold intolerance.  Genitourinary: Negative for difficulty urinating.  Musculoskeletal: Positive for neck pain and neck stiffness.  Skin: Negative for rash.  Allergic/Immunologic: Negative for food allergies.  Neurological: Positive for weakness.  Psychiatric/Behavioral: Positive for sleep  disturbance.     Objective: Vital Signs: BP (!) 125/58 (BP Location: Right Arm, Patient Position: Sitting, Cuff Size: Normal)   Pulse 65   Resp 14   Ht 5\' 3"  (1.6 m)   Wt 120 lb (54.4 kg)   BMI 21.26 kg/m   Physical Exam  Ortho Exam awake alert and oriented 3. Able to raise her right arm overhead is secured his motion. Some impingement with internal/external rotation the impingement position. Appears to have good strength with internal and external rotation. Biceps appears to be intact. Some pain along the anterior subacromial region and at the acromioclavicular joint. Pain in the subacromial region laterally with abduction. Skin intact although there is considerable bruising in the hand probably consistent with his anti  coagulation therapy  Specialty Comments:  No specialty comments available.  Imaging: No results found.   PMFS History: Patient Active Problem List   Diagnosis Date Noted  . GI bleed 02/23/2015  . Acute blood loss anemia 02/23/2015   Past Medical History:  Diagnosis Date  . Anemia   . Antiphospholipid antibody syndrome ([place])   . Arthritis   . DVT (deep venous thrombosis) ([place])   . Essential hypertension   . Gastric ulcer   . GERD (gastroesophageal reflux disease)   . History of CMV   . Hyperlipidemia   . Hyperparathyroidism ([place])   . Pedal edema   . Peripheral vascular disease ([place])   . Rosacea   .  Status post kidney transplant 2001  . Stroke ([place])   . TIA (transient ischemic attack)   . 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 No pertinent family history.  Past Surgical History:  Procedure Laterality Date  . CHOLECYSTECTOMY    . COLONOSCOPY WITH PROPOFOL N/A 03/06/2015   Procedure: COLONOSCOPY WITH PROPOFOL;  Surgeon: Josefine Class, MD;  Location: [facility] ENDOSCOPY;  Service: Endoscopy;  Laterality: N/A;  . ELBOW SURGERY    . ESOPHAGOGASTRODUODENOSCOPY Left 03/01/2015   Procedure: ESOPHAGOGASTRODUODENOSCOPY  (EGD);  Surgeon: Hulen Luster, MD;  Location: [facility] ENDOSCOPY;  Service: Endoscopy;  Laterality: Left;  . ESOPHAGOGASTRODUODENOSCOPY (EGD) WITH PROPOFOL  03/06/2015   Procedure: ESOPHAGOGASTRODUODENOSCOPY (EGD) WITH PROPOFOL;  Surgeon: Josefine Class, MD;  Location: [facility] ENDOSCOPY;  Service: Endoscopy;;  . ESOPHAGOGASTRODUODENOSCOPY (EGD) WITH PROPOFOL N/A 05/25/2015   Procedure: ESOPHAGOGASTRODUODENOSCOPY (EGD) WITH PROPOFOL;  Surgeon: Josefine Class, MD;  Location: [facility] ENDOSCOPY;  Service: Endoscopy;  Laterality: N/A;  . KIDNEY TRANSPLANT    . KNEE ARTHROSCOPY     Social History   Occupational History  . Not on file  Tobacco Use  . Smoking status: Former Smoker    Packs/day: 1.00    Years: 30.00    Pack years: 30.00    Types: Cigarettes    Last attempt to quit: 1999    Years since quitting: 20.1  . Smokeless tobacco: Never Used  Substance and Sexual Activity  . Alcohol use: No  . Drug use: No  . Sexual activity: Not on file

## 2017-04-04 ENCOUNTER — Ambulatory Visit (INDEPENDENT_AMBULATORY_CARE_PROVIDER_SITE_OTHER): Payer: Self-pay | Admitting: Orthopaedic Surgery

## 2017-04-07 ENCOUNTER — Ambulatory Visit
Admission: RE | Admit: 2017-04-07 | Discharge: 2017-04-07 | Disposition: A | Discharge status: Home / Self Care | Payer: Medicare Other | Source: Ambulatory Visit | Attending: Orthopaedic Surgery | Admitting: Orthopaedic Surgery

## 2017-04-07 DIAGNOSIS — G8929 Other chronic pain: Secondary | ICD-10-CM

## 2017-04-07 DIAGNOSIS — M25511 Pain in right shoulder: Principal | ICD-10-CM

## 2017-04-10 ENCOUNTER — Encounter (INDEPENDENT_AMBULATORY_CARE_PROVIDER_SITE_OTHER): Payer: Self-pay | Admitting: Orthopaedic Surgery

## 2017-04-10 ENCOUNTER — Ambulatory Visit (INDEPENDENT_AMBULATORY_CARE_PROVIDER_SITE_OTHER): Payer: Medicare Other | Admitting: Orthopaedic Surgery

## 2017-04-10 DIAGNOSIS — M25511 Pain in right shoulder: Secondary | ICD-10-CM

## 2017-04-10 DIAGNOSIS — G8929 Other chronic pain: Secondary | ICD-10-CM

## 2017-04-10 NOTE — Progress Notes (Signed)
Office Visit Note   Patient: Eric Gill           Date of Birth: 25-Aug-1950           MRN: [id number] Visit Date: 04/10/2017              Requested by: Idelle Crouch, MD [facility] [place], [place] [address] PCP: Idelle Crouch, MD   Assessment & Plan: Visit Diagnoses:  1. Chronic right shoulder pain     Plan: MRI scan right shoulder demonstrates full-thickness retracted rotator cuff tear involving the supra spinatus and subscapularis tendons. There is significant tendinopathy of the infraspinatus but no full-thickness tear. There is fatty atrophy of the supraspinatus and subscapularis muscles. Biceps long head is not identified and likely torn. There are moderate degenerative changes at the acromioclavicular joint with a type 2-3 acromion. Moderate degenerative changes in a small joint effusion identified the glenohumeral joint. Long discussion regarding the above findings. Definitive treatment would be arthroscopic distal clavicle resection, acromioplasty and mini open rotator cuff tear repair. I discussed this in detail with Mr. and Eric Gill. I'm concerned about the atrophy. He is able to raise his arm over his head also concerned as he has a number of medical comorbidities. He is being followed by Dr. Doy Hutching in [facility] for his primary medical care and will need clearance. 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 He is being followed at [facility]. He'll need a clearance form from them as well. He does have a kidney transplant and has to remain on those medicines which certainly might effect is healing with a rotator cuff tear. Nonetheless after much discussion they like to proceed. They know that there are potential complications an incomplete healing. I'm not sure how much retraction he has but the fact that he can raise his arm over his head is certainly a good sign that it might be repair  Follow-Up Instructions: Return will  schedule RCT repair right shoulder.   Orders:  No orders of the defined types were placed in this encounter.  No orders of the defined types were placed in this encounter.     Procedures: No procedures performed   Clinical Data: No additional findings.   Subjective: No chief complaint on file. Persistent right shoulder pain as previously identified. MRI scan has been performed and is here for review. Results are as above. Does have some trouble sleeping at night. No numbness or tingling.  HPI  Review of Systems   Objective: Vital Signs: There were no vitals taken for this visit.  Physical Exam  Ortho Exam awake alert and oriented 3. Comfortable sitting. Able to actively place his right arm fully over his head. I didn't appreciate any significant weakness. He can abduct 90. Positive impingement and empty can testing. Biceps appears to be distal position consistent with a rupture of the biceps tendon on MRI scan. Skin intact. Good grip and good release neurologically intact some pain over the acromioclavicular joint consistent with the arthritis noted on MRI scan crossarm test is negative. No pain with range of motion of cervical spine  Specialty Comments:  No specialty comments available.  Imaging: No results found.   PMFS History: Patient Active Problem List   Diagnosis Date Noted  . GI bleed 02/23/2015  . Acute blood loss anemia 02/23/2015   Past Medical History:  Diagnosis Date  . Anemia   . Antiphospholipid antibody syndrome ([place])   .  Arthritis   . DVT (deep venous thrombosis) ([place])   . Essential hypertension   . Gastric ulcer   . GERD (gastroesophageal reflux disease)   . History of CMV   . Hyperlipidemia   . Hyperparathyroidism ([place])   . Pedal edema   . Peripheral vascular disease ([place])   . Rosacea   . Status post kidney transplant 2001  . Stroke ([place])   . TIA (transient ischemic attack)   . Vascular dementia without behavioral disturbance       History reviewed. No pertinent family history.  Past Surgical History:  Procedure Laterality Date  . CHOLECYSTECTOMY    . COLONOSCOPY WITH PROPOFOL N/A 03/06/2015   Procedure: COLONOSCOPY WITH PROPOFOL;  Surgeon: Josefine Class, MD;  Location: [facility] ENDOSCOPY;  Service: Endoscopy;  Laterality: N/A;  . ELBOW SURGERY    . ESOPHAGOGASTRODUODENOSCOPY Left 03/01/2015   Procedure: ESOPHAGOGASTRODUODENOSCOPY (EGD);  Surgeon: Hulen Luster, MD;  Location: [facility] ENDOSCOPY;  Service: Endoscopy;  Laterality: Left;  . ESOPHAGOGASTRODUODENOSCOPY (EGD) WITH PROPOFOL  03/06/2015   Procedure: ESOPHAGOGASTRODUODENOSCOPY (EGD) WITH PROPOFOL;  Surgeon: Josefine Class, MD;  Location: [facility] ENDOSCOPY;  Service: Endoscopy;;  . ESOPHAGOGASTRODUODENOSCOPY (EGD) WITH PROPOFOL N/A 05/25/2015   Procedure: ESOPHAGOGASTRODUODENOSCOPY (EGD) WITH PROPOFOL;  Surgeon: Josefine Class, MD;  Location: [facility] ENDOSCOPY;  Service: Endoscopy;  Laterality: N/A;  . KIDNEY TRANSPLANT    . KNEE ARTHROSCOPY     Social History   Occupational History  . Not on file  Tobacco Use  . Smoking status: Former Smoker    Packs/day: 1.00    Years: 30.00    Pack years: 30.00    Types: Cigarettes    Last attempt to quit: 1999    Years since quitting: 20.1  . Smokeless tobacco: Never Used  Substance and Sexual Activity  . Alcohol use: No  . Drug use: No  . Sexual activity: Not on file     Garald Balding, MD   Note - This record has been created using [employer].  Chart creation errors have been sought, but may not always  have been located. Such creation errors do not reflect on  the standard of medical care.

## 2017-09-22 ENCOUNTER — Other Ambulatory Visit: Payer: Self-pay | Admitting: Internal Medicine

## 2017-09-22 DIAGNOSIS — R1031 Right lower quadrant pain: Secondary | ICD-10-CM

## 2017-09-22 DIAGNOSIS — R1032 Left lower quadrant pain: Principal | ICD-10-CM

## 2017-09-29 ENCOUNTER — Ambulatory Visit
Admission: RE | Admit: 2017-09-29 | Discharge: 2017-09-29 | Disposition: A | Discharge status: Home / Self Care | Payer: Medicare Other | Source: Ambulatory Visit | Attending: Internal Medicine | Admitting: Internal Medicine

## 2017-09-29 DIAGNOSIS — I714 Abdominal aortic aneurysm, without rupture: Secondary | ICD-10-CM | POA: Insufficient documentation

## 2017-09-29 DIAGNOSIS — R1031 Right lower quadrant pain: Secondary | ICD-10-CM

## 2017-09-29 DIAGNOSIS — R1032 Left lower quadrant pain: Secondary | ICD-10-CM | POA: Insufficient documentation

## 2017-09-29 DIAGNOSIS — Z9049 Acquired absence of other specified parts of digestive tract: Secondary | ICD-10-CM | POA: Insufficient documentation

## 2018-01-08 ENCOUNTER — Ambulatory Visit (INDEPENDENT_AMBULATORY_CARE_PROVIDER_SITE_OTHER): Payer: Medicare Other

## 2018-01-08 ENCOUNTER — Encounter (INDEPENDENT_AMBULATORY_CARE_PROVIDER_SITE_OTHER): Payer: Self-pay | Admitting: Orthopaedic Surgery

## 2018-01-08 ENCOUNTER — Ambulatory Visit (INDEPENDENT_AMBULATORY_CARE_PROVIDER_SITE_OTHER): Payer: Medicare Other | Admitting: Orthopaedic Surgery

## 2018-01-08 VITALS — BP 134/73 | HR 66 | Ht 63.0 in | Wt 131.0 lb

## 2018-01-08 DIAGNOSIS — M25511 Pain in right shoulder: Secondary | ICD-10-CM

## 2018-01-08 NOTE — Progress Notes (Signed)
Office Visit Note   Patient: Eric Gill           Date of Birth: 11/08/1950           MRN: [id number] Visit Date: 01/08/2018              Requested by: Idelle Crouch, MD [facility] [place], [place] [address] PCP: Idelle Crouch, MD   Assessment & Plan: Visit Diagnoses:  1. Acute pain of right shoulder     Plan: No acute changes on right shoulder films.  I think he is probably had an exacerbation of his prior rotator cuff tear with possible further tearing involving other tendons.  Will apply a sling for comfort and reevaluate over the next several weeks.  He actually was doing well until this fall yesterday. I suspect that he has an extensive tearing of 2 and possibly 3 of the rotator cuff tendons definitive procedure probably would be a reverse shoulder arthroplasty.  With all of his medical comorbidities would like to avoid that Follow-Up Instructions: No follow-ups on file.   Orders:  Orders Placed This Encounter  Procedures  . XR Shoulder Right   No orders of the defined types were placed in this encounter.     Procedures: No procedures performed   Clinical Data: No additional findings.   Subjective: Chief Complaint  Patient presents with  . Right Shoulder - Pain    DOI 01/07/18  Patient presents with right shoulder pain after a fall yesterday morning. He states that he fell while trying to get back into the bed. He was trying to miss the dog who was lying in the floor by the bed. He tried to catch himself when he fell onto an outstretched right arm.  has bruising all up and down the right arm, but takes a blood thinner daily.  Pain is worse with any type of movement. He is unable to use it for anything. He has taken tylenol with some relief. Eric Gill was seen earlier in the year for evaluation of right shoulder pain.  An MRI scan demonstrated tearing of the supraspinatus and subscapularis with tearing of the biceps tendon.  He  did well with exercises until he fell yesterday.  HPI  Review of Systems   Objective: Vital Signs: BP 134/73   Pulse 66   Ht 5\' 3"  (1.6 m)   Wt 131 lb (59.4 kg)   BMI 23.21 kg/m   Physical Exam  Constitutional: He is oriented to person, place, and time. He appears well-developed and well-nourished.  HENT:  Mouth/Throat: Oropharynx is clear and moist.  Eyes: Pupils are equal, round, and reactive to light. EOM are normal.  Pulmonary/Chest: Effort normal.  Neurological: He is alert and oriented to person, place, and time.  Skin: Skin is warm and dry.  Psychiatric: He has a normal mood and affect. His behavior is normal.    Ortho Exam awake alert and oriented x3.  Thin.  Having difficulty raising right arm overhead related to weakness and pain.    Positive impingement.  No obvious swelling.  No crepitation  Specialty Comments:  No specialty comments available.  Imaging: No results found.   PMFS History: Patient Active Problem List   Diagnosis Date Noted  . GI bleed 02/23/2015  . Acute blood loss anemia 02/23/2015   Past Medical History:  Diagnosis Date  . Anemia   . Antiphospholipid antibody syndrome ([place])   . Arthritis   .  DVT (deep venous thrombosis) ([place])   . Essential hypertension   . Gastric ulcer   . GERD (gastroesophageal reflux disease)   . History of CMV   . Hyperlipidemia   . Hyperparathyroidism ([place])   . Pedal edema   . Peripheral vascular disease ([place])   . Rosacea   . Status post kidney transplant 2001  . Stroke ([place])   . TIA (transient ischemic attack)   . Vascular dementia without behavioral disturbance ([place])     History reviewed. No pertinent family history.  Past Surgical History:  Procedure Laterality Date  . CHOLECYSTECTOMY    . COLONOSCOPY WITH PROPOFOL N/A 03/06/2015   Procedure: COLONOSCOPY WITH PROPOFOL;  Surgeon: Josefine Class, MD;  Location: [facility] ENDOSCOPY;  Service: Endoscopy;  Laterality: N/A;  . ELBOW SURGERY    .  ESOPHAGOGASTRODUODENOSCOPY Left 03/01/2015   Procedure: ESOPHAGOGASTRODUODENOSCOPY (EGD);  Surgeon: Hulen Luster, MD;  Location[facility] ENDOSCOPY;  Service: Endoscopy;  Laterality: Left;  . ESOPHAGOGASTRODUODENOSCOPY (EGD) WITH PROPOFOL  03/06/2015   Procedure: ESOPHAGOGASTRODUODENOSCOPY (EGD) WITH PROPOFOL;  Surgeon: Josefine Class, MD;  Location: [facility] ENDOSCOPY;  Service: Endoscopy;;  . ESOPHAGOGASTRODUODENOSCOPY (EGD) WITH PROPOFOL N/A 05/25/2015   Procedure: ESOPHAGOGASTRODUODENOSCOPY (EGD) WITH PROPOFOL;  Surgeon: Josefine Class, MD;  Location: [facility] ENDOSCOPY;  Service: Endoscopy;  Laterality: N/A;  . KIDNEY TRANSPLANT    . KNEE ARTHROSCOPY     Social History   Occupational History  . Not on file  Tobacco Use  . Smoking status: Former Smoker    Packs/day: 1.00    Years: 30.00    Pack years: 30.00    Types: Cigarettes    Last attempt to quit: 1999    Years since quitting: 20.9  . Smokeless tobacco: Never Used  Substance and Sexual Activity  . Alcohol use: No  . Drug use: No  . Sexual activity: Not on file

## 2018-01-08 NOTE — Progress Notes (Signed)
   Procedure Note  Patient: Eric Gill             Date of Birth: 1950-04-05           MRN: [id number]             Visit Date: 01/08/2018  Procedures: Visit Diagnoses: Acute pain of right shoulder - Plan: XR Shoulder Right  No procedures performed

## 2018-01-10 ENCOUNTER — Other Ambulatory Visit: Payer: Self-pay | Admitting: Physician Assistant

## 2018-01-10 ENCOUNTER — Ambulatory Visit
Admission: RE | Admit: 2018-01-10 | Discharge: 2018-01-10 | Disposition: A | Discharge status: Home / Self Care | Payer: Medicare Other | Source: Ambulatory Visit | Attending: Physician Assistant | Admitting: Physician Assistant

## 2018-01-10 DIAGNOSIS — R600 Localized edema: Secondary | ICD-10-CM

## 2018-01-22 ENCOUNTER — Encounter (INDEPENDENT_AMBULATORY_CARE_PROVIDER_SITE_OTHER): Payer: Self-pay | Admitting: Orthopaedic Surgery

## 2018-01-22 ENCOUNTER — Ambulatory Visit (INDEPENDENT_AMBULATORY_CARE_PROVIDER_SITE_OTHER): Payer: Medicare Other | Admitting: Orthopaedic Surgery

## 2018-01-22 VITALS — BP 151/81 | HR 60 | Ht 63.0 in | Wt 126.0 lb

## 2018-01-22 DIAGNOSIS — M25511 Pain in right shoulder: Secondary | ICD-10-CM | POA: Diagnosis not present

## 2018-01-22 NOTE — Progress Notes (Deleted)
   Office Visit Note   Patient: Eric Gill           Date of Birth: [birth year]           MRN: [id number] Visit Date: 01/22/2018              Requested by: Idelle Crouch, MD [facility] [place], [place] [address] PCP: Idelle Crouch, MD   Assessment & Plan: Visit Diagnoses: No diagnosis found.  Plan: ***  Follow-Up Instructions: No follow-ups on file.   Orders:  No orders of the defined types were placed in this encounter.  No orders of the defined types were placed in this encounter.     Procedures: No procedures performed   Clinical Data: No additional findings.   Subjective: Chief Complaint  Patient presents with  . Right Shoulder - Follow-up  . Shoulder Pain    pain has gotten better, still not able to rise it     HPI  Review of Systems  Skin: Positive for rash.  Neurological: Positive for weakness.     Objective: Vital Signs: There were no vitals taken for this visit.  Physical Exam  Ortho Exam  Specialty Comments:  No specialty comments available.  Imaging: No results found.   PMFS History: Patient Active Problem List   Diagnosis Date Noted  . GI bleed 02/23/2015  . Acute blood loss anemia 02/23/2015   Past Medical History:  Diagnosis Date  . Anemia   . Antiphospholipid antibody syndrome ([place])   . Arthritis   . DVT (deep venous thrombosis) ([place])   . Essential hypertension   . Gastric ulcer   . GERD (gastroesophageal reflux disease)   . History of CMV   . Hyperlipidemia   . Hyperparathyroidism ([place])   . Pedal edema   . Peripheral vascular disease ([place])   . Rosacea   . Status post kidney transplant 2001  . Stroke ([place])   . TIA (transient ischemic attack)   . Vascular dementia without behavioral disturbance ([place])     No family history on file.  Past Surgical History:  Procedure Laterality Date  . CHOLECYSTECTOMY    . COLONOSCOPY WITH PROPOFOL N/A 03/06/2015   Procedure: COLONOSCOPY  WITH PROPOFOL;  Surgeon: Josefine Class, MD;  Location: [facility] ENDOSCOPY;  Service: Endoscopy;  Laterality: N/A;  . ELBOW SURGERY    . ESOPHAGOGASTRODUODENOSCOPY Left 03/01/2015   Procedure: ESOPHAGOGASTRODUODENOSCOPY (EGD);  Surgeon: Hulen Luster, MD;  Location: [facility] ENDOSCOPY;  Service: Endoscopy;  Laterality: Left;  . ESOPHAGOGASTRODUODENOSCOPY (EGD) WITH PROPOFOL  03/06/2015   Procedure: ESOPHAGOGASTRODUODENOSCOPY (EGD) WITH PROPOFOL;  Surgeon: Josefine Class, MD;  Location: [facility] ENDOSCOPY;  Service: Endoscopy;;  . ESOPHAGOGASTRODUODENOSCOPY (EGD) WITH PROPOFOL N/A 05/25/2015   Procedure: ESOPHAGOGASTRODUODENOSCOPY (EGD) WITH PROPOFOL;  Surgeon: Josefine Class, MD;  Location: [facility] ENDOSCOPY;  Service: Endoscopy;  Laterality: N/A;  . KIDNEY TRANSPLANT    . KNEE ARTHROSCOPY     Social History   Occupational History  . Not on file  Tobacco Use  . Smoking status: Former Smoker    Packs/day: 1.00    Years: 30.00    Pack years: 30.00    Types: Cigarettes    Last attempt to quit: 1999    Years since quitting: 20.9  . Smokeless tobacco: Never Used  Substance and Sexual Activity  . Alcohol use: No  . Drug use: No  . Sexual activity: Not on file

## 2018-01-22 NOTE — Progress Notes (Signed)
Office Visit Note   Patient: Eric Gill           Date of Birth: [birth year]           MRN: [id number] Visit Date: 01/22/2018              Requested by: Idelle Crouch, MD [facility] [place], [place] [address] PCP: Idelle Crouch, MD   Assessment & Plan: Visit Diagnoses:  1. Acute pain of right shoulder     Plan: Acute injury right shoulder several weeks ago with an exacerbation of a prior shoulder issue.  Feeling better with less pain and has stopped using the sling but still unable to raise his right arm over his head discussion with Mr. and Mrs. navis regarding the present problem.  I suspect he has had further insult to the rotator cuff with inability to raise his arm over his head.  The big issue is whether or not he has a repairable lesion.  Would not know that unless he had an MRI scan.  Many medical comorbidities which might preclude any definitive procedure.  Mr. Mrs. Casanova  would like to wait till after  Christmas before they decide on obtaining the MRI scan  Follow-Up Instructions: Return in about 3 weeks (around 02/12/2018).   Orders:  No orders of the defined types were placed in this encounter.  No orders of the defined types were placed in this encounter.     Procedures: No procedures performed   Clinical Data: No additional findings.   Subjective: Chief Complaint  Patient presents with  . Right Shoulder - Follow-up  . Shoulder Pain    pain has gotten better, still not able to rise it     HPI  Review of Systems   Objective: Vital Signs: BP (!) 151/81   Pulse 60   Ht 5\' 3"  (1.6 m)   Wt 126 lb (57.2 kg)   BMI 22.32 kg/m   Physical Exam Constitutional:      Appearance: He is well-developed.  Eyes:     Pupils: Pupils are equal, round, and reactive to light.  Pulmonary:     Effort: Pulmonary effort is normal.  Skin:    General: Skin is warm and dry.  Neurological:     Mental Status: He is alert and  oriented to person, place, and time.  Psychiatric:        Behavior: Behavior normal.     Ortho Exam awake alert and oriented x3.  Comfortable sitting.  I can passively abduct his right shoulder to 90 degrees.  I can flex to about 50 degrees at which point he was uncomfortable.  Arm is quite thin.  Biceps appears to be a distal position but unchanged from previous evaluations prior to this injury no ecchymosis.  Good grip and release.  Is right hand.  Specialty Comments:  No specialty comments available.  Imaging: No results found.   PMFS History: Patient Active Problem List   Diagnosis Date Noted  . GI bleed 02/23/2015  . Acute blood loss anemia 02/23/2015   Past Medical History:  Diagnosis Date  . Anemia   . Antiphospholipid antibody syndrome ([place])   . Arthritis   . DVT (deep venous thrombosis) ([place])   . Essential hypertension   . Gastric ulcer   . GERD (gastroesophageal reflux disease)   . History of CMV   . Hyperlipidemia   . Hyperparathyroidism ([place])   . Pedal edema   .  Peripheral vascular disease ([place])   . Rosacea   . Status post kidney transplant 2001  . Stroke ([place])   . TIA (transient ischemic attack)   . Vascular dementia without behavioral disturbance ([place])     History reviewed. No pertinent family history.  Past Surgical History:  Procedure Laterality Date  . CHOLECYSTECTOMY    . COLONOSCOPY WITH PROPOFOL N/A 03/06/2015   Procedure: COLONOSCOPY WITH PROPOFOL;  Surgeon: Josefine Class, MD;  Location: [facility] ENDOSCOPY;  Service: Endoscopy;  Laterality: N/A;  . ELBOW SURGERY    . ESOPHAGOGASTRODUODENOSCOPY Left 03/01/2015   Procedure: ESOPHAGOGASTRODUODENOSCOPY (EGD);  Surgeon: Hulen Luster, MD;  Location: [facility] ENDOSCOPY;  Service: Endoscopy;  Laterality: Left;  . ESOPHAGOGASTRODUODENOSCOPY (EGD) WITH PROPOFOL  03/06/2015   Procedure: ESOPHAGOGASTRODUODENOSCOPY (EGD) WITH PROPOFOL;  Surgeon: Josefine Class, MD;  Location: [facility] ENDOSCOPY;  Service:  Endoscopy;;  . ESOPHAGOGASTRODUODENOSCOPY (EGD) WITH PROPOFOL N/A 05/25/2015   Procedure: ESOPHAGOGASTRODUODENOSCOPY (EGD) WITH PROPOFOL;  Surgeon: Josefine Class, MD;  Location: [facility] ENDOSCOPY;  Service: Endoscopy;  Laterality: N/A;  . KIDNEY TRANSPLANT    . KNEE ARTHROSCOPY     Social History   Occupational History  . Not on file  Tobacco Use  . Smoking status: Former Smoker    Packs/day: 1.00    Years: 30.00    Pack years: 30.00    Types: Cigarettes    Last attempt to quit: 1999    Years since quitting: 20.9  . Smokeless tobacco: Never Used  Substance and Sexual Activity  . Alcohol use: No  . Drug use: No  . Sexual activity: Not on file     Garald Balding, MD   Note - This record has been created using [employer].  Chart creation errors have been sought, but may not always  have been located. Such creation errors do not reflect on  the standard of medical care.

## 2018-02-12 ENCOUNTER — Encounter (INDEPENDENT_AMBULATORY_CARE_PROVIDER_SITE_OTHER): Payer: Self-pay | Admitting: Orthopaedic Surgery

## 2018-08-13 ENCOUNTER — Encounter: Payer: Self-pay | Admitting: Emergency Medicine

## 2018-08-13 ENCOUNTER — Other Ambulatory Visit: Payer: Self-pay

## 2018-08-13 DIAGNOSIS — M1990 Unspecified osteoarthritis, unspecified site: Secondary | ICD-10-CM | POA: Diagnosis not present

## 2018-08-13 DIAGNOSIS — Z79899 Other long term (current) drug therapy: Secondary | ICD-10-CM | POA: Diagnosis not present

## 2018-08-13 DIAGNOSIS — Z940 Kidney transplant status: Secondary | ICD-10-CM | POA: Diagnosis not present

## 2018-08-13 DIAGNOSIS — D631 Anemia in chronic kidney disease: Secondary | ICD-10-CM | POA: Diagnosis not present

## 2018-08-13 DIAGNOSIS — J449 Chronic obstructive pulmonary disease, unspecified: Secondary | ICD-10-CM | POA: Diagnosis not present

## 2018-08-13 DIAGNOSIS — F0390 Unspecified dementia without behavioral disturbance: Secondary | ICD-10-CM | POA: Insufficient documentation

## 2018-08-13 DIAGNOSIS — T8619 Other complication of kidney transplant: Secondary | ICD-10-CM | POA: Insufficient documentation

## 2018-08-13 DIAGNOSIS — Z79890 Hormone replacement therapy (postmenopausal): Secondary | ICD-10-CM | POA: Insufficient documentation

## 2018-08-13 DIAGNOSIS — N2581 Secondary hyperparathyroidism of renal origin: Secondary | ICD-10-CM | POA: Diagnosis not present

## 2018-08-13 DIAGNOSIS — Y830 Surgical operation with transplant of whole organ as the cause of abnormal reaction of the patient, or of later complication, without mention of misadventure at the time of the procedure: Secondary | ICD-10-CM | POA: Diagnosis not present

## 2018-08-13 DIAGNOSIS — E785 Hyperlipidemia, unspecified: Secondary | ICD-10-CM | POA: Insufficient documentation

## 2018-08-13 DIAGNOSIS — K625 Hemorrhage of anus and rectum: Secondary | ICD-10-CM | POA: Diagnosis present

## 2018-08-13 DIAGNOSIS — Z7951 Long term (current) use of inhaled steroids: Secondary | ICD-10-CM | POA: Insufficient documentation

## 2018-08-13 DIAGNOSIS — Z8711 Personal history of peptic ulcer disease: Secondary | ICD-10-CM | POA: Insufficient documentation

## 2018-08-13 DIAGNOSIS — K219 Gastro-esophageal reflux disease without esophagitis: Secondary | ICD-10-CM | POA: Insufficient documentation

## 2018-08-13 DIAGNOSIS — Z87891 Personal history of nicotine dependence: Secondary | ICD-10-CM | POA: Diagnosis not present

## 2018-08-13 DIAGNOSIS — Z86718 Personal history of other venous thrombosis and embolism: Secondary | ICD-10-CM | POA: Diagnosis not present

## 2018-08-13 DIAGNOSIS — Z1159 Encounter for screening for other viral diseases: Secondary | ICD-10-CM | POA: Diagnosis not present

## 2018-08-13 DIAGNOSIS — Z7901 Long term (current) use of anticoagulants: Secondary | ICD-10-CM | POA: Insufficient documentation

## 2018-08-13 DIAGNOSIS — N179 Acute kidney failure, unspecified: Secondary | ICD-10-CM | POA: Diagnosis not present

## 2018-08-13 DIAGNOSIS — I739 Peripheral vascular disease, unspecified: Secondary | ICD-10-CM | POA: Diagnosis not present

## 2018-08-13 DIAGNOSIS — Z8673 Personal history of transient ischemic attack (TIA), and cerebral infarction without residual deficits: Secondary | ICD-10-CM | POA: Insufficient documentation

## 2018-08-13 DIAGNOSIS — I120 Hypertensive chronic kidney disease with stage 5 chronic kidney disease or end stage renal disease: Secondary | ICD-10-CM | POA: Insufficient documentation

## 2018-08-13 DIAGNOSIS — D6861 Antiphospholipid syndrome: Secondary | ICD-10-CM | POA: Insufficient documentation

## 2018-08-13 LAB — CBC
HCT: 36 % — ABNORMAL LOW (ref 39.0–52.0)
Hemoglobin: 11.1 g/dL — ABNORMAL LOW (ref 13.0–17.0)
MCH: 28.4 pg (ref 26.0–34.0)
MCHC: 30.8 g/dL (ref 30.0–36.0)
MCV: 92.1 fL (ref 80.0–100.0)
Platelets: 147 10*3/uL — ABNORMAL LOW (ref 150–400)
RBC: 3.91 MIL/uL — ABNORMAL LOW (ref 4.22–5.81)
RDW: 13.7 % (ref 11.5–15.5)
WBC: 5.8 10*3/uL (ref 4.0–10.5)
nRBC: 0 % (ref 0.0–0.2)

## 2018-08-13 LAB — COMPREHENSIVE METABOLIC PANEL
ALT: 13 U/L (ref 0–44)
AST: 17 U/L (ref 15–41)
Albumin: 3.8 g/dL (ref 3.5–5.0)
Alkaline Phosphatase: 84 U/L (ref 38–126)
Anion gap: 9 (ref 5–15)
BUN: 33 mg/dL — ABNORMAL HIGH (ref 8–23)
CO2: 19 mmol/L — ABNORMAL LOW (ref 22–32)
Calcium: 9 mg/dL (ref 8.9–10.3)
Chloride: 111 mmol/L (ref 98–111)
Creatinine, Ser: 3.02 mg/dL — ABNORMAL HIGH (ref 0.61–1.24)
GFR calc Af Amer: 23 mL/min — ABNORMAL LOW (ref >60)
GFR calc non Af Amer: 20 mL/min — ABNORMAL LOW (ref >60)
Glucose, Bld: 112 mg/dL — ABNORMAL HIGH (ref 70–99)
Potassium: 3.9 mmol/L (ref 3.5–5.1)
Sodium: 139 mmol/L (ref 135–145)
Total Bilirubin: 0.3 mg/dL (ref 0.3–1.2)
Total Protein: 6.1 g/dL — ABNORMAL LOW (ref 6.5–8.1)

## 2018-08-13 LAB — TYPE AND SCREEN
ABO/RH(D): A NEG
Antibody Screen: NEGATIVE
DAT, IgG: NEGATIVE
Weak D: POSITIVE

## 2018-08-13 NOTE — ED Triage Notes (Signed)
Pt presents to ED with bright red blood noted from his rectum today. Pt has a hx of the same but states last time the blood was dark and due to bleeding ulcer. Pt does have hemorrhoids currently. Denies recent constipation.  Pt does report having cramping pain in his abd.

## 2018-08-14 ENCOUNTER — Observation Stay
Admission: EM | Admit: 2018-08-14 | Discharge: 2018-08-15 | Disposition: A | Discharge status: Home / Self Care | Payer: Medicare Other | Attending: Internal Medicine | Admitting: Internal Medicine

## 2018-08-14 ENCOUNTER — Other Ambulatory Visit: Payer: Self-pay

## 2018-08-14 DIAGNOSIS — D649 Anemia, unspecified: Secondary | ICD-10-CM | POA: Diagnosis not present

## 2018-08-14 DIAGNOSIS — D6861 Antiphospholipid syndrome: Secondary | ICD-10-CM | POA: Diagnosis not present

## 2018-08-14 DIAGNOSIS — Z7901 Long term (current) use of anticoagulants: Secondary | ICD-10-CM

## 2018-08-14 DIAGNOSIS — K625 Hemorrhage of anus and rectum: Secondary | ICD-10-CM | POA: Diagnosis not present

## 2018-08-14 DIAGNOSIS — K922 Gastrointestinal hemorrhage, unspecified: Secondary | ICD-10-CM | POA: Diagnosis not present

## 2018-08-14 LAB — BASIC METABOLIC PANEL
Anion gap: 8 (ref 5–15)
BUN: 31 mg/dL — ABNORMAL HIGH (ref 8–23)
CO2: 22 mmol/L (ref 22–32)
Calcium: 9.2 mg/dL (ref 8.9–10.3)
Chloride: 112 mmol/L — ABNORMAL HIGH (ref 98–111)
Creatinine, Ser: 3.1 mg/dL — ABNORMAL HIGH (ref 0.61–1.24)
GFR calc Af Amer: 22 mL/min — ABNORMAL LOW (ref >60)
GFR calc non Af Amer: 19 mL/min — ABNORMAL LOW (ref >60)
Glucose, Bld: 86 mg/dL (ref 70–99)
Potassium: 3.5 mmol/L (ref 3.5–5.1)
Sodium: 142 mmol/L (ref 135–145)

## 2018-08-14 LAB — APTT: aPTT: 57 seconds — ABNORMAL HIGH (ref 24–36)

## 2018-08-14 LAB — CBC
HCT: 35.1 % — ABNORMAL LOW (ref 39.0–52.0)
Hemoglobin: 10.8 g/dL — ABNORMAL LOW (ref 13.0–17.0)
MCH: 28.8 pg (ref 26.0–34.0)
MCHC: 30.8 g/dL (ref 30.0–36.0)
MCV: 93.6 fL (ref 80.0–100.0)
Platelets: 140 10*3/uL — ABNORMAL LOW (ref 150–400)
RBC: 3.75 MIL/uL — ABNORMAL LOW (ref 4.22–5.81)
RDW: 13.7 % (ref 11.5–15.5)
WBC: 6.1 10*3/uL (ref 4.0–10.5)
nRBC: 0 % (ref 0.0–0.2)

## 2018-08-14 LAB — HEMOGLOBIN AND HEMATOCRIT, BLOOD
HCT: 36.1 % — ABNORMAL LOW (ref 39.0–52.0)
HCT: 35 % — ABNORMAL LOW (ref 39.0–52.0)
Hemoglobin: 11.2 g/dL — ABNORMAL LOW (ref 13.0–17.0)
Hemoglobin: 10.7 g/dL — ABNORMAL LOW (ref 13.0–17.0)

## 2018-08-14 LAB — PROTIME-INR
INR: 2.2 — ABNORMAL HIGH (ref 0.8–1.2)
INR: 2.2 — ABNORMAL HIGH (ref 0.8–1.2)
Prothrombin Time: 23.8 seconds — ABNORMAL HIGH (ref 11.4–15.2)
Prothrombin Time: 23.8 seconds — ABNORMAL HIGH (ref 11.4–15.2)

## 2018-08-14 LAB — SARS CORONAVIRUS 2 BY RT PCR (HOSPITAL ORDER, PERFORMED IN ~~LOC~~ HOSPITAL LAB): SARS Coronavirus 2: NEGATIVE

## 2018-08-14 LAB — HEMOGLOBIN: Hemoglobin: 10.9 g/dL — ABNORMAL LOW (ref 13.0–17.0)

## 2018-08-14 MED ORDER — LABETALOL HCL 5 MG/ML IV SOLN
10.0000 mg | Freq: Once | INTRAVENOUS | Status: AC
  Administered 2018-08-14: 05:00:00 10 mg via INTRAVENOUS

## 2018-08-14 MED ORDER — LABETALOL HCL 200 MG PO TABS
200.0000 mg | ORAL_TABLET | Freq: Two times a day (BID) | ORAL | Status: DC
  Administered 2018-08-14 – 2018-08-15 (×3): 200 mg via ORAL
  Filled 2018-08-14 (×4): qty 1

## 2018-08-14 MED ORDER — TACROLIMUS 0.5 MG PO CAPS
0.5000 mg | ORAL_CAPSULE | Freq: Two times a day (BID) | ORAL | Status: DC
  Administered 2018-08-14 – 2018-08-15 (×3): 0.5 mg via ORAL
  Filled 2018-08-14 (×4): qty 1

## 2018-08-14 MED ORDER — IPRATROPIUM-ALBUTEROL 0.5-2.5 (3) MG/3ML IN SOLN: 3.0000 mL | Freq: Four times a day (QID) | RESPIRATORY_TRACT | Status: DC | PRN

## 2018-08-14 MED ORDER — AMLODIPINE BESYLATE 10 MG PO TABS
10.0000 mg | ORAL_TABLET | Freq: Every day | ORAL | Status: DC
  Administered 2018-08-14 – 2018-08-15 (×2): 10 mg via ORAL
  Filled 2018-08-14 (×2): qty 1

## 2018-08-14 MED ORDER — WARFARIN - PHYSICIAN DOSING INPATIENT: Freq: Every day | Status: DC

## 2018-08-14 MED ORDER — CITALOPRAM HYDROBROMIDE 20 MG PO TABS
20.0000 mg | ORAL_TABLET | Freq: Every day | ORAL | Status: DC
  Administered 2018-08-14 – 2018-08-15 (×2): 20 mg via ORAL
  Filled 2018-08-14 (×2): qty 1

## 2018-08-14 MED ORDER — MOMETASONE FURO-FORMOTEROL FUM 100-5 MCG/ACT IN AERO
2.0000 | INHALATION_SPRAY | Freq: Two times a day (BID) | RESPIRATORY_TRACT | Status: DC
  Administered 2018-08-14 – 2018-08-15 (×3): 2 via RESPIRATORY_TRACT
  Filled 2018-08-14: qty 8.8

## 2018-08-14 MED ORDER — PANTOPRAZOLE SODIUM 40 MG IV SOLR
40.0000 mg | Freq: Two times a day (BID) | INTRAVENOUS | Status: DC
  Administered 2018-08-14 – 2018-08-15 (×3): 40 mg via INTRAVENOUS
  Filled 2018-08-14 (×3): qty 40

## 2018-08-14 MED ORDER — HYDRALAZINE HCL 20 MG/ML IJ SOLN
10.0000 mg | Freq: Four times a day (QID) | INTRAMUSCULAR | Status: DC | PRN
  Filled 2018-08-14: qty 1

## 2018-08-14 MED ORDER — SODIUM CHLORIDE 0.9 % IV SOLN: 250.0000 mL | INTRAVENOUS | Status: DC | PRN

## 2018-08-14 MED ORDER — LABETALOL HCL 5 MG/ML IV SOLN
INTRAVENOUS | Status: AC
  Filled 2018-08-14: qty 4

## 2018-08-14 MED ORDER — MYCOPHENOLATE MOFETIL 250 MG PO CAPS: 250.0000 mg | ORAL_CAPSULE | Freq: Two times a day (BID) | ORAL | Status: DC

## 2018-08-14 MED ORDER — PREDNISONE 5 MG PO TABS
5.0000 mg | ORAL_TABLET | Freq: Every day | ORAL | Status: DC
  Administered 2018-08-14 – 2018-08-15 (×2): 5 mg via ORAL
  Filled 2018-08-14 (×2): qty 1

## 2018-08-14 MED ORDER — BUPROPION HCL ER (XL) 150 MG PO TB24
300.0000 mg | ORAL_TABLET | Freq: Every day | ORAL | Status: DC
  Administered 2018-08-14 – 2018-08-15 (×2): 300 mg via ORAL
  Filled 2018-08-14 (×2): qty 2

## 2018-08-14 MED ORDER — WARFARIN SODIUM 2 MG PO TABS: 2.0000 mg | ORAL_TABLET | ORAL | Status: DC

## 2018-08-14 MED ORDER — DICYCLOMINE HCL 10 MG PO CAPS
10.0000 mg | ORAL_CAPSULE | Freq: Three times a day (TID) | ORAL | Status: DC
  Administered 2018-08-14 – 2018-08-15 (×4): 10 mg via ORAL
  Filled 2018-08-14 (×6): qty 1

## 2018-08-14 MED ORDER — MYCOPHENOLATE MOFETIL 250 MG PO CAPS
500.0000 mg | ORAL_CAPSULE | Freq: Every day | ORAL | Status: DC
  Administered 2018-08-14: 500 mg via ORAL
  Filled 2018-08-14 (×2): qty 2

## 2018-08-14 MED ORDER — MYCOPHENOLATE MOFETIL 250 MG PO CAPS
250.0000 mg | ORAL_CAPSULE | Freq: Every morning | ORAL | Status: DC
  Administered 2018-08-14 – 2018-08-15 (×2): 250 mg via ORAL
  Filled 2018-08-14 (×2): qty 1

## 2018-08-14 MED ORDER — POLYETHYLENE GLYCOL 3350 17 G PO PACK: 17.0000 g | PACK | Freq: Every day | ORAL | Status: DC | PRN

## 2018-08-14 MED ORDER — ONDANSETRON HCL 4 MG/2ML IJ SOLN: 4.0000 mg | Freq: Four times a day (QID) | INTRAMUSCULAR | Status: DC | PRN

## 2018-08-14 MED ORDER — SODIUM CHLORIDE 0.9% FLUSH: 3.0000 mL | INTRAVENOUS | Status: DC | PRN

## 2018-08-14 MED ORDER — SODIUM CHLORIDE 0.9% FLUSH
3.0000 mL | Freq: Two times a day (BID) | INTRAVENOUS | Status: DC
  Administered 2018-08-14 – 2018-08-15 (×3): 3 mL via INTRAVENOUS

## 2018-08-14 MED ORDER — DONEPEZIL HCL 5 MG PO TABS
10.0000 mg | ORAL_TABLET | Freq: Every day | ORAL | Status: DC
  Administered 2018-08-14: 10 mg via ORAL
  Filled 2018-08-14 (×2): qty 2

## 2018-08-14 MED ORDER — CALCITRIOL 0.25 MCG PO CAPS
0.2500 ug | ORAL_CAPSULE | Freq: Every day | ORAL | Status: DC
  Administered 2018-08-14: 0.25 ug via ORAL
  Filled 2018-08-14 (×2): qty 1

## 2018-08-14 MED ORDER — ONDANSETRON HCL 4 MG PO TABS: 4.0000 mg | ORAL_TABLET | Freq: Four times a day (QID) | ORAL | Status: DC | PRN

## 2018-08-14 MED ORDER — ACETAMINOPHEN 325 MG PO TABS: 650.0000 mg | ORAL_TABLET | Freq: Four times a day (QID) | ORAL | Status: DC | PRN

## 2018-08-14 MED ORDER — ACETAMINOPHEN 650 MG RE SUPP: 650.0000 mg | Freq: Four times a day (QID) | RECTAL | Status: DC | PRN

## 2018-08-14 MED ORDER — WARFARIN SODIUM 2 MG PO TABS
2.0000 mg | ORAL_TABLET | ORAL | Status: DC
  Administered 2018-08-14: 2 mg via ORAL
  Filled 2018-08-14: qty 1

## 2018-08-14 MED ORDER — MAGNESIUM OXIDE 400 (241.3 MG) MG PO TABS
400.0000 mg | ORAL_TABLET | Freq: Two times a day (BID) | ORAL | Status: DC
  Administered 2018-08-14 – 2018-08-15 (×3): 400 mg via ORAL
  Filled 2018-08-14 (×3): qty 1

## 2018-08-14 MED ORDER — WARFARIN SODIUM 3 MG PO TABS
3.0000 mg | ORAL_TABLET | ORAL | Status: DC
  Filled 2018-08-14: qty 1

## 2018-08-14 MED ORDER — SODIUM CHLORIDE 0.9 % IV SOLN
INTRAVENOUS | Status: DC
  Administered 2018-08-14: 11:00:00 via INTRAVENOUS

## 2018-08-14 NOTE — Consult Note (Signed)
Eric Lame, MD [facility]  [address], [place], [place] [address] Phone: [phone] Fax : [phone]  Consultation  Referring Provider:     Gardiner Barefoot, NP Primary Care Physician:  Idelle Crouch, MD Primary Gastroenterologist:  Dr. Vira Agar         Reason for Consultation:     Hematochezia  Date of Admission:  08/14/2018 Date of Consultation:  08/14/2018         HPI:   Eric Gill is a 68 y.o. male who has a history of antiphospholipid syndrome and multiple DVTs and strokes.  The patient wife had reported in the ER that if the patient's INR was to go below 2 he has had strokes in the past.  The patient was admitted to the hospital because of bright red blood per rectum.  The patient states that he has had 2 bowel movements with blood in it.  The first 1 was a large amount and then he reports that the second was of lesser amount.  There is a report in the chart that the patient has early dementia.  The patient has a history of a GI bleed in 2017 and underwent an endoscopy at that time and had a history of gastric ulcer.  The patient was seen by Dr. Rayann Heman and most recently by Dr. Vira Agar for diarrhea.  The patient denies any abdominal pain associated with his rectal bleeding.  The patient's hemoglobin on admission was 11.1 with the patient's previous hemoglobin back in October 2018 was 11.4 and prior to that in January 2017 was between 7.8 and 6.5.  The patient's hemoglobin this morning increased to 11.2.  The patient was seen by hematology who recommended keeping the patient on Coumadin.  Past Medical History:  Diagnosis Date  . Anemia   . Antiphospholipid antibody syndrome ([place])   . Arthritis   . DVT (deep venous thrombosis) ([place])   . Essential hypertension   . Gastric ulcer   . GERD (gastroesophageal reflux disease)   . History of CMV   . Hyperlipidemia   . Hyperparathyroidism ([place])   . Pedal edema   . Peripheral vascular disease ([place])   . Rosacea   . Status post  kidney transplant 2001  . Stroke ([place])   . TIA (transient ischemic attack)   . Vascular dementia without behavioral disturbance [facility])     Past Surgical History:  Procedure Laterality Date  . CHOLECYSTECTOMY    . COLONOSCOPY WITH PROPOFOL N/A 03/06/2015   Procedure: COLONOSCOPY WITH PROPOFOL;  Surgeon: Josefine Class, MD;  Location: [facility] ENDOSCOPY;  Service: Endoscopy;  Laterality: N/A;  . ELBOW SURGERY    . ESOPHAGOGASTRODUODENOSCOPY Left 03/01/2015   Procedure: ESOPHAGOGASTRODUODENOSCOPY (EGD);  Surgeon: Hulen Luster, MD;  Location: [facility] ENDOSCOPY;  Service: Endoscopy;  Laterality: Left;  . ESOPHAGOGASTRODUODENOSCOPY (EGD) WITH PROPOFOL  03/06/2015   Procedure: ESOPHAGOGASTRODUODENOSCOPY (EGD) WITH PROPOFOL;  Surgeon: Josefine Class, MD;  Location: [facility] ENDOSCOPY;  Service: Endoscopy;;  . ESOPHAGOGASTRODUODENOSCOPY (EGD) WITH PROPOFOL N/A 05/25/2015   Procedure: ESOPHAGOGASTRODUODENOSCOPY (EGD) WITH PROPOFOL;  Surgeon: Josefine Class, MD;  Location: [facility] ENDOSCOPY;  Service: Endoscopy;  Laterality: N/A;  . KIDNEY TRANSPLANT    . KNEE ARTHROSCOPY      Prior to Admission medications   Medication Sig Start Date End Date Taking? Authorizing Provider  albuterol (PROVENTIL HFA) 108 (90 Base) MCG/ACT inhaler Inhale into the lungs. 06/01/16 08/14/18 Yes [provider]  amLODipine (NORVASC) 10 MG tablet Take 10 mg  by mouth daily.   Yes [provider]  budesonide-formoterol (SYMBICORT) 80-4.5 MCG/ACT inhaler Inhale 2 puffs into the lungs 2 (two) times daily.   Yes [provider]  buPROPion (WELLBUTRIN XL) 150 MG 24 hr tablet Take 300 mg by mouth daily.  06/16/16  Yes [provider]  calcitRIOL (ROCALTROL) 0.25 MCG capsule Take 0.25 mcg by mouth at bedtime.   Yes [provider]  citalopram (CELEXA) 20 MG tablet Take 20 mg by mouth daily.   Yes [provider]  COUMADIN 1 MG tablet Take 2-3 mg by mouth as directed. 3 mg Monday, Weds,  Friday. 2 mg Tues, Thursday, Sat and Sun 02/22/17  Yes [provider]  dicyclomine (BENTYL) 10 MG capsule Take 10 mg by mouth 3 (three) times daily before meals.  01/01/18  Yes [provider]  diphenoxylate-atropine (LOMOTIL) 2.5-0.025 MG tablet Take 1 tablet by mouth 4 (four) times daily as needed for diarrhea or loose stools.   Yes [provider]  donepezil (ARICEPT) 10 MG tablet Take 10 mg by mouth at bedtime.   Yes [provider]  furosemide (LASIX) 40 MG tablet Take 40 mg by mouth daily as needed.  02/08/17  Yes [provider]  labetalol (NORMODYNE) 200 MG tablet Take 200 mg by mouth 2 (two) times daily.    Yes [provider]  magnesium oxide (MAG-OX) 400 MG tablet TAKE ONE TABLET BY MOUTH TWICE DAILY 02/22/17  Yes [provider]  Multiple Vitamin (MULTI-VITAMINS) TABS Take 1 tablet by mouth daily.    Yes [provider]  mycophenolate (CELLCEPT) 250 MG capsule Take 250-500 mg by mouth 2 (two) times daily. 250 mg in the am and 500 mg at bedtime   Yes [provider]  pantoprazole (PROTONIX) 40 MG tablet Take 1 tablet (40 mg total) by mouth 2 (two) times daily before a meal. Patient taking differently: Take 40 mg by mouth daily.  03/06/15  Yes Epifanio Lesches, MD  predniSONE (DELTASONE) 20 MG tablet Take 1 tablet (20 mg total) by mouth daily with breakfast. Patient taking differently: Take 5 mg by mouth daily with breakfast.  03/08/15  Yes Epifanio Lesches, MD  tacrolimus (PROGRAF) 0.5 MG capsule Take 1 capsule by mouth 2 (two) times a day. 04/28/18 04/28/19 Yes [provider]  fluorouracil (EFUDEX) 5 % cream  02/23/17   [provider]    No family history on file.   Social History   Tobacco Use  . Smoking status: Former Smoker    Packs/day: 1.00    Years: 30.00    Pack years: 30.00    Types: Cigarettes    Quit date: 1999    Years since quitting: 21.5  . Smokeless tobacco:  Never Used  Substance Use Topics  . Alcohol use: No  . Drug use: No    Allergies as of 08/13/2018 - Review Complete 08/13/2018  Allergen Reaction Noted  . Losartan Cough 06/08/2015  . Statins Nausea And Vomiting and Other (See Comments) 10/01/2014    Review of Systems:    All systems reviewed and negative except where noted in HPI.   Physical Exam:  Vital signs in last 24 hours: Temp:  [98.3 F (36.8 C)-99.1 F (37.3 C)] 98.3 F (36.8 C) (07/07 1231) Pulse Rate:  [62-84] 62 (07/07 1231) Resp:  [13-20] 16 (07/07 1231) BP: (143-197)/(74-92) 143/77 (07/07 1231) SpO2:  [94 %-98 %] 97 % (07/07 1231) Weight:  [57.2 kg] 57.2 kg (07/06 2118) Last  BM Date: 08/13/18 General:   Pleasant, cooperative in NAD Head:  Normocephalic and atraumatic. Eyes:   No icterus.   Conjunctiva pink. PERRLA. Ears:  Normal auditory acuity. Neck:  Supple; no masses or thyroidomegaly Lungs: Respirations even and unlabored. Lungs clear to auscultation bilaterally.   No wheezes, crackles, or rhonchi.  Heart:  Regular rate and rhythm;  Without murmur, clicks, rubs or gallops Abdomen:  Soft, nondistended, nontender. Normal bowel sounds. No appreciable masses or hepatomegaly.  No rebound or guarding.  Rectal:  Not performed. Msk:  Symmetrical without gross deformities.   Extremities:  Without edema, cyanosis or clubbing. Neurologic:  Alert and oriented x3;  grossly normal neurologically. Skin:  Intact without significant lesions or rashes. Cervical Nodes:  No significant cervical adenopathy. Psych:  Alert and cooperative. Normal affect.  LAB RESULTS: Recent Labs    08/13/18 2124 08/14/18 0600 08/14/18 0936 08/14/18 1246  WBC 5.8 6.1  --   --   HGB 11.1* 10.8* 10.9* 11.2*  HCT 36.0* 35.1*  --  36.1*  PLT 147* 140*  --   --    BMET Recent Labs    08/13/18 2124 08/14/18 0600  NA 139 142  K 3.9 3.5  CL 111 112*  CO2 19* 22  GLUCOSE 112* 86  BUN 33* 31*  CREATININE 3.02* 3.10*  CALCIUM 9.0  9.2   LFT Recent Labs    08/13/18 2124  PROT 6.1*  ALBUMIN 3.8  AST 17  ALT 13  ALKPHOS 84  BILITOT 0.3   PT/INR Recent Labs    08/14/18 0315 08/14/18 0600  LABPROT 23.8* 23.8*  INR 2.2* 2.2*    STUDIES: No results found.    Impression / Plan:   Assessment: Active Problems:   GI bleed   Eric Gill is a 68 y.o. y/o male with a lower GI bleed and a history of antiphospholipid syndrome.  It was recommended that the patient stay on his Coumadin by hematology.  There is no utility of trying to stop any GI bleeding while the patient is on Coumadin and the bleeding could be made worse.  Since the patient is stable from a GI point of view with his hemoglobin being higher now than on admission.  Plan:  The patient will be monitored for any drop in his hemoglobin and any further sign of GI bleeding.  I have explained to the patient that doing a colonoscopy while he is on Coumadin can be dangerous and unlikely to stop any active bleeding.  The patient has been explained the plan and agrees with it.  Thank you for involving me in the care of this patient.      LOS: 0 days   Eric Lame, MD  08/14/2018, 6:47 PM    Note: This dictation was prepared with Dragon dictation along with smaller phrase technology. Any transcriptional errors that result from this process are unintentional.

## 2018-08-14 NOTE — Progress Notes (Signed)
I have paged the hospitalist several times and sent Gardiner Barefoot, NP a message about patient's BP and no fluid orders since he is NPO. As of now no one has responded to my message or pages.

## 2018-08-14 NOTE — Consult Note (Signed)
[facility]  CONSULT NOTE    Date: 08/14/2018                  Patient Name:  Eric Gill  MRN: [id number]  DOB: 03/04/1950  Age / Sex: 68 y.o., male         PCP: Idelle Crouch, MD                 Service Requesting Consult: Dr. 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  Continues to take warfarin for APA syndrome.   Medications: Outpatient medications: Medications Prior to Admission  Medication Sig Dispense Refill Last Dose  . albuterol (PROVENTIL HFA) 108 (90 Base) MCG/ACT inhaler Inhale into the lungs.   prn at prn  . amLODipine (NORVASC) 10 MG tablet Take 10 mg by mouth daily.   08/13/2018 at Unknown time  . budesonide-formoterol (SYMBICORT) 80-4.5 MCG/ACT inhaler Inhale 2 puffs into the lungs 2 (two) times daily.   08/13/2018 at Unknown time  . buPROPion (WELLBUTRIN XL) 150 MG 24 hr tablet Take 300 mg by mouth daily.    08/13/2018 at Unknown time  . calcitRIOL (ROCALTROL) 0.25 MCG capsule Take 0.25 mcg by mouth at bedtime.   Past Week at Unknown time  . citalopram (CELEXA) 20 MG tablet Take 20 mg by mouth daily.   08/13/2018 at Unknown time  . COUMADIN 1 MG tablet Take 2-3 mg by mouth as directed. 3 mg Monday, Weds, Friday. 2 mg Tues, Thursday, Sat and Sun   Past Week at Unknown time  . dicyclomine (BENTYL) 10 MG capsule Take 10 mg by mouth 3 (three) times daily before meals.    08/13/2018 at Unknown time  . diphenoxylate-atropine (LOMOTIL) 2.5-0.025 MG tablet Take 1 tablet by mouth 4 (four) times daily as needed for diarrhea or loose stools.   prn at prn  . donepezil (ARICEPT) 10 MG tablet Take 10 mg by mouth at bedtime.   Past Week at Unknown time  . furosemide  (LASIX) 40 MG tablet Take 40 mg by mouth daily as needed.    prn at prn  . labetalol (NORMODYNE) 200 MG tablet Take 200 mg by mouth 2 (two) times daily.    08/13/2018 at Unknown time  . magnesium oxide (MAG-OX) 400 MG tablet TAKE ONE TABLET BY MOUTH TWICE DAILY   08/13/2018 at Unknown time  . Multiple Vitamin (MULTI-VITAMINS) TABS Take 1 tablet by mouth daily.    08/13/2018 at Unknown time  . mycophenolate (CELLCEPT) 250 MG capsule Take 250-500 mg by mouth 2 (two) times daily. 250 mg in the am and 500 mg at bedtime   08/13/2018 at Unknown time  . pantoprazole (PROTONIX) 40 MG tablet Take 1 tablet (40 mg total) by mouth 2 (two) times daily before a meal. (Patient taking differently: Take 40 mg by mouth daily. ) 60 tablet 0 08/13/2018 at Unknown time  . predniSONE (DELTASONE) 20 MG tablet Take 1 tablet (20 mg total) by mouth daily with breakfast. (Patient taking differently: Take 5 mg by mouth daily with breakfast. ) 18 tablet 0 08/13/2018 at Unknown time  .  tacrolimus (PROGRAF) 0.5 MG capsule Take 1 capsule by mouth 2 (two) times a day.   08/13/2018 at Unknown time  . fluorouracil (EFUDEX) 5 % cream    Not Taking at Unknown time    Current medications: Current Facility-Administered Medications  Medication Dose Route Frequency Provider Last Rate Last Dose  . 0.9 %  sodium chloride infusion  250 mL Intravenous PRN Seals, Levada Dy H, NP      . 0.9 %  sodium chloride infusion   Intravenous Continuous Fritzi Mandes, MD 50 mL/hr at 08/14/18 1058    . acetaminophen (TYLENOL) tablet 650 mg  650 mg Oral Q6H PRN Seals, Theo Dills, NP       Or  . acetaminophen (TYLENOL) suppository 650 mg  650 mg Rectal Q6H PRN Seals, Theo Dills, NP      . amLODipine (NORVASC) tablet 10 mg  10 mg Oral Daily Seals, Angela H, NP   10 mg at 08/14/18 0915  . buPROPion (WELLBUTRIN XL) 24 hr tablet 300 mg  300 mg Oral Daily Seals, Angela H, NP   300 mg at 08/14/18 0914  . calcitRIOL (ROCALTROL) capsule 0.25 mcg  0.25 mcg Oral QHS Seals, Angela H, NP       . citalopram (CELEXA) tablet 20 mg  20 mg Oral Daily Seals, Angela H, NP   20 mg at 08/14/18 0916  . dicyclomine (BENTYL) capsule 10 mg  10 mg Oral TID AC Seals, Theo Dills, NP   10 mg at 08/14/18 0915  . donepezil (ARICEPT) tablet 10 mg  10 mg Oral QHS Seals, Angela H, NP      . hydrALAZINE (APRESOLINE) injection 10 mg  10 mg Intravenous Q6H PRN Seals, Angela H, NP      . ipratropium-albuterol (DUONEB) 0.5-2.5 (3) MG/3ML nebulizer solution 3 mL  3 mL Nebulization Q6H PRN Seals, Angela H, NP      . labetalol (NORMODYNE) tablet 200 mg  200 mg Oral BID Seals, Angela H, NP   200 mg at 08/14/18 0915  . magnesium oxide (MAG-OX) tablet 400 mg  400 mg Oral BID Gardiner Barefoot H, NP   400 mg at 08/14/18 0915  . mometasone-formoterol (DULERA) 100-5 MCG/ACT inhaler 2 puff  2 puff Inhalation BID Gardiner Barefoot H, NP   2 puff at 08/14/18 0917  . mycophenolate (CELLCEPT) capsule 250 mg  250 mg Oral q morning - 10a Hallaji, Dani Gobble, RPH   250 mg at 08/14/18 0915   And  . mycophenolate (CELLCEPT) capsule 500 mg  500 mg Oral QHS Hallaji, Sheema M, RPH      . ondansetron (ZOFRAN) tablet 4 mg  4 mg Oral Q6H PRN Seals, Theo Dills, NP       Or  . ondansetron (ZOFRAN) injection 4 mg  4 mg Intravenous Q6H PRN Seals, Angela H, NP      . pantoprazole (PROTONIX) injection 40 mg  40 mg Intravenous Q12H Seals, Angela H, NP   40 mg at 08/14/18 0914  . polyethylene glycol (MIRALAX / GLYCOLAX) packet 17 g  17 g Oral Daily PRN Seals, Levada Dy H, NP      . predniSONE (DELTASONE) tablet 5 mg  5 mg Oral Q breakfast Seals, Theo Dills, NP   5 mg at 08/14/18 0859  . sodium chloride flush (NS) 0.9 % injection 3 mL  3 mL Intravenous Q12H Seals, Angela H, NP   3 mL at 08/14/18 0917  . sodium chloride flush (NS) 0.9 % injection 3 mL  3 mL Intravenous PRN Seals, Levada Dy H, NP      . tacrolimus (PROGRAF) capsule 0.5 mg  0.5 mg Oral BID Gardiner Barefoot H, NP   0.5 mg at 08/14/18 0914  . [START ON 08/15/2018] warfarin (COUMADIN) tablet 3 mg  3 mg Oral  Once per day on Mon Wed Fri [place], Sheema M, [facility]       And  . warfarin (COUMADIN) tablet 2 mg  2 mg Oral Once per day on Sun Tue Thu Sat Nancy Fetter [place], [place]      . Warfarin - Physician Dosing Inpatient   Does not apply q1800 Pernell Dupre, RPH          Allergies: Allergies  Allergen Reactions  . Losartan Cough  . Statins Nausea And Vomiting and Other (See Comments)    Reaction:  Muscle pain       Past Medical History: Past Medical History:  Diagnosis Date  . Anemia   . Antiphospholipid antibody syndrome ([place])   . Arthritis   . DVT (deep venous thrombosis) ([place])   . Essential hypertension   . Gastric ulcer   . GERD (gastroesophageal reflux disease)   . History of CMV   . Hyperlipidemia   . Hyperparathyroidism ([place])   . Pedal edema   . Peripheral vascular disease ([place])   . Rosacea   . Status post kidney transplant 2001  . Stroke ([place])   . TIA (transient ischemic attack)   . Vascular dementia without behavioral disturbance [facility])      Past Surgical History: Past Surgical History:  Procedure Laterality Date  . CHOLECYSTECTOMY    . COLONOSCOPY WITH PROPOFOL N/A 03/06/2015   Procedure: COLONOSCOPY WITH PROPOFOL;  Surgeon: Josefine Class, MD;  Location[facility] ENDOSCOPY;  Service: Endoscopy;  Laterality: N/A;  . ELBOW SURGERY    . ESOPHAGOGASTRODUODENOSCOPY Left 03/01/2015   Procedure: ESOPHAGOGASTRODUODENOSCOPY (EGD);  Surgeon: Hulen Luster, MD;  Location: [facility] ENDOSCOPY;  Service: Endoscopy;  Laterality: Left;  . ESOPHAGOGASTRODUODENOSCOPY (EGD) WITH PROPOFOL  03/06/2015   Procedure: ESOPHAGOGASTRODUODENOSCOPY (EGD) WITH PROPOFOL;  Surgeon: Josefine Class, MD;  Location: [facility] ENDOSCOPY;  Service: Endoscopy;;  . ESOPHAGOGASTRODUODENOSCOPY (EGD) WITH PROPOFOL N/A 05/25/2015   Procedure: ESOPHAGOGASTRODUODENOSCOPY (EGD) WITH PROPOFOL;  Surgeon: Josefine Class, MD;  Location: [facility] ENDOSCOPY;  Service: Endoscopy;  Laterality: N/A;  . KIDNEY TRANSPLANT    .  KNEE ARTHROSCOPY       Family History: No family history on file.   Social History: Social History   Socioeconomic History  . Marital status: Married    Spouse name: Not on file  . Number of children: Not on file  . Years of education: Not on file  . Highest education level: Not on file  Occupational History  . Not on file  Social Needs  . Financial resource strain: Not on file  . Food insecurity    Worry: Not on file    Inability: Not on file  . Transportation needs    Medical: Not on file    Non-medical: Not on file  Tobacco Use  . Smoking status: Former Smoker    Packs/day: 1.00    Years: 30.00    Pack years: 30.00    Types: Cigarettes    Quit date: 1999    Years since quitting: 21.5  . Smokeless tobacco: Never Used  Substance and Sexual Activity  . Alcohol use: No  . Drug use: No  . Sexual activity: Not on file  Lifestyle  .  Physical activity    Days per week: Not on file    Minutes per session: Not on file  . Stress: Not on file  Relationships  . Social Herbalist on phone: Not on file    Gets together: Not on file    Attends religious service: Not on file    Active member of club or organization: Not on file    Attends meetings of clubs or organizations: Not on file    Relationship status: Not on file  . Intimate partner violence    Fear of current or ex partner: Not on file    Emotionally abused: Not on file    Physically abused: Not on file    Forced sexual activity: Not on file  Other Topics Concern  . Not on file  Social History Narrative  . Not on file     Review of Systems: Review of Systems  Constitutional: Negative.  Negative for chills, diaphoresis, fever, malaise/fatigue and weight loss.  HENT: Negative.  Negative for congestion, ear discharge, ear pain, hearing loss, nosebleeds, sinus pain, sore throat and tinnitus.   Eyes: Negative.  Negative for blurred vision, double vision, photophobia, pain, discharge and redness.   Respiratory: Negative.  Negative for cough, hemoptysis, sputum production, shortness of breath, wheezing and stridor.   Cardiovascular: Negative.  Negative for chest pain, palpitations, orthopnea, claudication, leg swelling and PND.  Gastrointestinal: Positive for blood in stool and constipation. Negative for abdominal pain, diarrhea, heartburn, melena, nausea and vomiting.  Genitourinary: Negative.  Negative for dysuria, flank pain, frequency, hematuria and urgency.  Musculoskeletal: Negative.  Negative for back pain, falls, joint pain, myalgias and neck pain.  Skin: Negative.  Negative for itching and rash.  Neurological: Negative.  Negative for dizziness, tingling, tremors, sensory change, speech change, focal weakness, seizures, loss of consciousness, weakness and headaches.  Endo/Heme/Allergies: Negative for environmental allergies and polydipsia. Bruises/bleeds easily.  Psychiatric/Behavioral: Negative.  Negative for depression, hallucinations, memory loss, substance abuse and suicidal ideas. The patient is not nervous/anxious and does not have insomnia.     Vital Signs: Blood pressure (!) 143/77, pulse 62, temperature 98.3 F (36.8 C), temperature source Oral, resp. rate 16, height 5\' 3"  (1.6 m), weight 57.2 kg, SpO2 97 %.  Weight trends: Filed Weights   08/13/18 2118  Weight: 57.2 kg    Physical Exam: General: NAD,   Head: Normocephalic, atraumatic. Moist oral mucosal membranes  Eyes: Anicteric, PERRL  Neck: Supple, trachea midline  Lungs:  Clear to auscultation  Heart: Regular rate and rhythm  Abdomen:  Soft, nontender, left lower quadrant kidney - nontender  Extremities: no peripheral edema.  Neurologic: Nonfocal, moving all four extremities  Skin: No lesions  Access: none     Lab results: Basic Metabolic Panel: Recent Labs  Lab 08/13/18 2124 08/14/18 0600  NA 139 142  K 3.9 3.5  CL 111 112*  CO2 19* 22  GLUCOSE 112* 86  BUN 33* 31*  CREATININE 3.02* 3.10*   CALCIUM 9.0 9.2    Liver Function Tests: Recent Labs  Lab 08/13/18 2124  AST 17  ALT 13  ALKPHOS 84  BILITOT 0.3  PROT 6.1*  ALBUMIN 3.8   No results for input(s): LIPASE, AMYLASE in the last 168 hours. No results for input(s): AMMONIA in the last 168 hours.  CBC: Recent Labs  Lab 08/13/18 2124 08/14/18 0600 08/14/18 0936 08/14/18 1246  WBC 5.8 6.1  --   --   HGB 11.1* 10.8*  10.9* 11.2*  HCT 36.0* 35.1*  --  36.1*  MCV 92.1 93.6  --   --   PLT 147* 140*  --   --     Cardiac Enzymes: No results for input(s): CKTOTAL, CKMB, CKMBINDEX, TROPONINI in the last 168 hours.  BNP: Invalid input(s): POCBNP  CBG: No results for input(s): GLUCAP in the last 168 hours.  Microbiology: Results for orders placed or performed during the hospital encounter of 08/14/18  SARS Coronavirus 2 (CEPHEID - Performed in [place] hospital lab), Hosp Order     Status: None   Collection Time: 08/14/18  3:15 AM   Specimen: Nasopharyngeal Swab  Result Value Ref Range Status   SARS Coronavirus 2 NEGATIVE NEGATIVE Final    Comment: (NOTE) If result is NEGATIVE SARS-CoV-2 target nucleic acids are NOT DETECTED. The SARS-CoV-2 RNA is generally detectable in upper and lower  respiratory specimens during the acute phase of infection. The lowest  concentration of SARS-CoV-2 viral copies this assay can detect is 250  copies / mL. A negative result does not preclude SARS-CoV-2 infection  and should not be used as the sole basis for treatment or other  patient management decisions.  A negative result may occur with  improper specimen collection / handling, submission of specimen other  than nasopharyngeal swab, presence of viral mutation(s) within the  areas targeted by this assay, and inadequate number of viral copies  (<250 copies / mL). A negative result must be combined with clinical  observations, patient history, and epidemiological information. If result is POSITIVE SARS-CoV-2 target  nucleic acids are DETECTED. The SARS-CoV-2 RNA is generally detectable in upper and lower  respiratory specimens dur ing the acute phase of infection.  Positive  results are indicative of active infection with SARS-CoV-2.  Clinical  correlation with patient history and other diagnostic information is  necessary to determine patient infection status.  Positive results do  not rule out bacterial infection or co-infection with other viruses. If result is PRESUMPTIVE POSTIVE SARS-CoV-2 nucleic acids MAY BE PRESENT.   A presumptive positive result was obtained on the submitted specimen  and confirmed on repeat testing.  While 2019 novel coronavirus  (SARS-CoV-2) nucleic acids may be present in the submitted sample  additional confirmatory testing may be necessary for epidemiological  and / or clinical management purposes  to differentiate between  SARS-CoV-2 and other Sarbecovirus currently known to infect humans.  If clinically indicated additional testing with an alternate test  methodology [phone]) is advised. The SARS-CoV-2 RNA is generally  detectable in upper and lower respiratory sp ecimens during the acute  phase of infection. The expected result is Negative. Fact Sheet for Patients:  StrictlyIdeas.no Fact Sheet for Healthcare Providers: BankingDealers.co.za This test is not yet approved or cleared by the [place] FDA and has been authorized for detection and/or diagnosis of SARS-CoV-2 by FDA under an Emergency Use Authorization (EUA).  This EUA will remain in effect (meaning this test can be used) for the duration of the COVID-19 declaration under Section 564(b)(1) of the Act, 21 U.S.C. section 360bbb-3(b)(1), unless the authorization is terminated or revoked sooner. Performed at [facility], [address] [place], [place] [address]     Coagulation Studies: Recent Labs    08/14/18 0315 08/14/18 0600   LABPROT 23.8* 23.8*  INR 2.2* 2.2*    Urinalysis: No results for input(s): COLORURINE, LABSPEC, PHURINE, GLUCOSEU, HGBUR, BILIRUBINUR, KETONESUR, PROTEINUR, UROBILINOGEN, NITRITE, LEUKOCYTESUR in the last 72 hours.  Invalid input(s):  APPERANCEUR    Imaging:  No results found.   Assessment & Plan: Mr. REEGAN BOUFFARD is a 68 y.o. 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 Acute renal failure on Chronic kidney disease stage IV T Status post renal transplantation in 2001. His baseline creatinine is 2.8, GFR of 23 on 03/27/18. Creatinine close to baseline.  Followed by Dr. Alger Simons, [facility].  - Continue mycophenolate, tacrolimus and prednisone.  - Holding furosemide. .   2. 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  - appreciate GI input. - Do not stop anticoagulation without nephrology and hematology approval due to Anti-phospholipid antibody syndrome.   3. Hypertension.  Blood pressure at goal. 143/77 - continue amlodipine, labetalol - holding furosemide.   4.  Secondary hyperparathyroidism.   - Continue calcitriol   LOS: 0 Araminta Zorn 7/7/20201:32 PM

## 2018-08-14 NOTE — Consult Note (Addendum)
[place]  [phone] [phone]  ID: Eric Gill OB: Sep 14, 1950  MR#: [id number]  LOV#:[id number]  Patient Care Team: Idelle Crouch, MD as PCP - General (Internal Medicine)  CHIEF COMPLAINT: Antiphospholipid syndrome with GI bleed.  INTERVAL HISTORY: [name] a 68 year old male with a longstanding history of antiphospholipid syndrome on Coumadin.  He has had multiple strokes and DVTs particularly when his INR falls below 2.0.  He was recently noted to have bright red blood per rectum and is admitted for evaluation and consideration of colonoscopy.  He otherwise feels well.  He has no neurologic complaints.  He denies any recent fevers or illnesses.  He has good appetite and denies weight loss.  He has no chest pain, shortness of breath, cough, or hemoptysis.  He denies any nausea, vomiting, constipation, or diarrhea.  He has no urinary complaints.  Patient otherwise feels well and offers no further specific complaints today.  REVIEW OF SYSTEMS:   Review of Systems  Constitutional: Negative.  Negative for fever, malaise/fatigue and weight loss.  Respiratory: Negative.  Negative for cough, hemoptysis and shortness of breath.   Cardiovascular: Negative.  Negative for chest pain and leg swelling.  Gastrointestinal: Positive for blood in stool. Negative for abdominal pain and melena.  Genitourinary: Negative.  Negative for hematuria.  Musculoskeletal: Negative.  Negative for back pain.  Skin: Negative.  Negative for rash.  Neurological: Negative.  Negative for dizziness, focal weakness, weakness and headaches.  Psychiatric/Behavioral: Negative.  The patient is not nervous/anxious.     As per HPI. Otherwise, a complete review of systems is negative.  PAST MEDICAL HISTORY: Past Medical History:  Diagnosis Date  . Anemia   . Antiphospholipid antibody syndrome ([place])   . Arthritis   . DVT (deep venous thrombosis) ([place])   . Essential  hypertension   . Gastric ulcer   . GERD (gastroesophageal reflux disease)   . History of CMV   . Hyperlipidemia   . Hyperparathyroidism ([place])   . Pedal edema   . Peripheral vascular disease ([place])   . Rosacea   . Status post kidney transplant 2001  . Stroke ([place])   . TIA (transient ischemic attack)   . Vascular dementia without behavioral disturbance ([place])     PAST SURGICAL HISTORY: Past Surgical History:  Procedure Laterality Date  . CHOLECYSTECTOMY    . COLONOSCOPY WITH PROPOFOL N/A 03/06/2015   Procedure: COLONOSCOPY WITH PROPOFOL;  Surgeon: Josefine Class, MD;  Location: [facility] ENDOSCOPY;  Service: Endoscopy;  Laterality: N/A;  . ELBOW SURGERY    . ESOPHAGOGASTRODUODENOSCOPY Left 03/01/2015   Procedure: ESOPHAGOGASTRODUODENOSCOPY (EGD);  Surgeon: Hulen Luster, MD;  Location: [facility] ENDOSCOPY;  Service: Endoscopy;  Laterality: Left;  . ESOPHAGOGASTRODUODENOSCOPY (EGD) WITH PROPOFOL  03/06/2015   Procedure: ESOPHAGOGASTRODUODENOSCOPY (EGD) WITH PROPOFOL;  Surgeon: Josefine Class, MD;  Location: [facility] ENDOSCOPY;  Service: Endoscopy;;  . ESOPHAGOGASTRODUODENOSCOPY (EGD) WITH PROPOFOL N/A 05/25/2015   Procedure: ESOPHAGOGASTRODUODENOSCOPY (EGD) WITH PROPOFOL;  Surgeon: Josefine Class, MD;  Location: [facility] ENDOSCOPY;  Service: Endoscopy;  Laterality: N/A;  . KIDNEY TRANSPLANT    . KNEE ARTHROSCOPY      FAMILY HISTORY: No family history on file.  ADVANCED DIRECTIVES (Y/N):  @ADVDIR @  HEALTH MAINTENANCE: Social History   Tobacco Use  . Smoking status: Former Smoker    Packs/day: 1.00    Years: 30.00    Pack years: 30.00    Types: Cigarettes    Quit date: 1999    Years  since quitting: 21.5  . Smokeless tobacco: Never Used  Substance Use Topics  . Alcohol use: No  . Drug use: No     Colonoscopy:  PAP:  Bone density:  Lipid panel:  Allergies  Allergen Reactions  . Losartan Cough  . Statins Nausea And Vomiting and Other (See Comments)    Reaction:  Muscle pain      Current Facility-Administered Medications  Medication Dose Route Frequency Provider Last Rate Last Dose  . 0.9 %  sodium chloride infusion  250 mL Intravenous PRN Seals, Levada Dy H, NP      . acetaminophen (TYLENOL) tablet 650 mg  650 mg Oral Q6H PRN Seals, Theo Dills, NP       Or  . acetaminophen (TYLENOL) suppository 650 mg  650 mg Rectal Q6H PRN Seals, Theo Dills, NP      . amLODipine (NORVASC) tablet 10 mg  10 mg Oral Daily Seals, Levada Dy H, NP   10 mg at 08/14/18 0915  . buPROPion (WELLBUTRIN XL) 24 hr tablet 300 mg  300 mg Oral Daily Seals, Angela H, NP   300 mg at 08/14/18 0914  . calcitRIOL (ROCALTROL) capsule 0.25 mcg  0.25 mcg Oral QHS Seals, Angela H, NP      . citalopram (CELEXA) tablet 20 mg  20 mg Oral Daily Seals, Angela H, NP   20 mg at 08/14/18 0916  . dicyclomine (BENTYL) capsule 10 mg  10 mg Oral TID AC Seals, Theo Dills, NP   10 mg at 08/14/18 1659  . donepezil (ARICEPT) tablet 10 mg  10 mg Oral QHS Seals, Angela H, NP      . hydrALAZINE (APRESOLINE) injection 10 mg  10 mg Intravenous Q6H PRN Seals, Angela H, NP      . ipratropium-albuterol (DUONEB) 0.5-2.5 (3) MG/3ML nebulizer solution 3 mL  3 mL Nebulization Q6H PRN Seals, Angela H, NP      . labetalol (NORMODYNE) tablet 200 mg  200 mg Oral BID Seals, Angela H, NP   200 mg at 08/14/18 0915  . magnesium oxide (MAG-OX) tablet 400 mg  400 mg Oral BID Gardiner Barefoot H, NP   400 mg at 08/14/18 0915  . mometasone-formoterol (DULERA) 100-5 MCG/ACT inhaler 2 puff  2 puff Inhalation BID Gardiner Barefoot H, NP   2 puff at 08/14/18 0917  . mycophenolate (CELLCEPT) capsule 250 mg  250 mg Oral q morning - 10a Hallaji, Dani Gobble, RPH   250 mg at 08/14/18 0915   And  . mycophenolate (CELLCEPT) capsule 500 mg  500 mg Oral QHS Hallaji, Sheema M, RPH      . ondansetron (ZOFRAN) tablet 4 mg  4 mg Oral Q6H PRN Seals, Theo Dills, NP       Or  . ondansetron (ZOFRAN) injection 4 mg  4 mg Intravenous Q6H PRN Seals, Angela H, NP      . pantoprazole  (PROTONIX) injection 40 mg  40 mg Intravenous Q12H Seals, Angela H, NP   40 mg at 08/14/18 0914  . polyethylene glycol (MIRALAX / GLYCOLAX) packet 17 g  17 g Oral Daily PRN Seals, Levada Dy H, NP      . predniSONE (DELTASONE) tablet 5 mg  5 mg Oral Q breakfast Seals, Theo Dills, NP   5 mg at 08/14/18 0859  . sodium chloride flush (NS) 0.9 % injection 3 mL  3 mL Intravenous Q12H Seals, Angela H, NP   3 mL at 08/14/18 0917  . sodium chloride flush (NS) 0.9 %  injection 3 mL  3 mL Intravenous PRN Seals, Levada Dy H, NP      . tacrolimus (PROGRAF) capsule 0.5 mg  0.5 mg Oral BID Gardiner Barefoot H, NP   0.5 mg at 08/14/18 0914  . [START ON 08/15/2018] warfarin (COUMADIN) tablet 3 mg  3 mg Oral Once per day on Mon Wed Fri [place], Sheema M, [facility]       And  . warfarin (COUMADIN) tablet 2 mg  2 mg Oral Once per day on Sun Tue Thu Sat Nancy Fetter M, RPH   2 mg at 08/14/18 1700  . Warfarin - Physician Dosing Inpatient   Does not apply q1800 Pernell Dupre, RPH        OBJECTIVE: Vitals:   08/14/18 1000 08/14/18 1231  BP: (!) 154/78 (!) 143/77  Pulse:  62  Resp:  16  Temp:  98.3 F (36.8 C)  SpO2:  97%     Body mass index is 22.32 kg/m.    ECOG FS:1 - Symptomatic but completely ambulatory  General: Well-developed, well-nourished, no acute distress. Eyes: Pink conjunctiva, anicteric sclera. HEENT: Normocephalic, moist mucous membranes, clear oropharnyx. Lungs: Clear to auscultation bilaterally. Heart: Regular rate and rhythm. No rubs, murmurs, or gallops. Abdomen: Soft, nontender, nondistended. No organomegaly noted, normoactive bowel sounds. Musculoskeletal: No edema, cyanosis, or clubbing. Neuro: Alert, answering all questions appropriately. Cranial nerves grossly intact. Skin: No rashes or petechiae noted. Psych: Normal affect. Lymphatics: No cervical, calvicular, axillary or inguinal LAD.   LAB RESULTS:  Lab Results  Component Value Date   NA 142 08/14/2018   K 3.5 08/14/2018   CL 112 (H)  08/14/2018   CO2 22 08/14/2018   GLUCOSE 86 08/14/2018   BUN 31 (H) 08/14/2018   CREATININE 3.10 (H) 08/14/2018   CALCIUM 9.2 08/14/2018   PROT 6.1 (L) 08/13/2018   ALBUMIN 3.8 08/13/2018   AST 17 08/13/2018   ALT 13 08/13/2018   ALKPHOS 84 08/13/2018   BILITOT 0.3 08/13/2018   GFRNONAA 19 (L) 08/14/2018   GFRAA 22 (L) 08/14/2018    Lab Results  Component Value Date   WBC 6.1 08/14/2018   NEUTROABS 4.5 11/13/2016   HGB 10.7 (L) 08/14/2018   HCT 35.0 (L) 08/14/2018   MCV 93.6 08/14/2018   PLT 140 (L) 08/14/2018     STUDIES: No results found.  ASSESSMENT: Antiphospholipid syndrome with GI bleed.  PLAN:    1. Antiphospholipid syndrome: [name] followed by Dr. Dionne Milo at [facility].  He is on chronic Coumadin and by report if his INR falls below 2.0 he is at high risk for repeat DVT or stroke.  Have recommended patient stay on his current dose of Coumadin and monitor daily INR. Appreciate GI input.  There is no plan to proceed with luminal evaluation at this time since his hemoglobin remains stable.   Please ensure patient has close follow-up with his primary hematologist upon discharge. 2.  GI bleed: Appreciate gastroenterology input.  No colonoscopy planned at this time.  If patient's hemoglobin trends down, will re-discuss options with GI. 3.  Anemia: [name] hemoglobin is decreased, but relatively stable since discharge at 10.7.  Continue to monitor hemoglobin closely, every 6-12 hours. 4.  Disposition: Okay to discharge if patient's hemoglobin remains stable.  Appreciate consult, will follow.  Lloyd Huger, MD   08/14/2018 8:41 PM

## 2018-08-14 NOTE — Plan of Care (Signed)
  Problem: Clinical Measurements: Goal: Diagnostic test results will improve Outcome: Progressing Patient with GIB, hgb is at patients baseline and increasing despite patient actively bleeding.

## 2018-08-14 NOTE — ED Provider Notes (Signed)
[facility] Emergency Department Provider Note  ____________________________________________   First MD Initiated Contact with Patient 08/14/18 [phone]     (approximate)  I have reviewed the triage vital signs and the nursing notes.   HISTORY  Chief Complaint Rectal Bleeding    HPI Eric Gill is a 68 y.o. male with medical history as listed below which notably includes antiphospholipid antibody syndrome and multiple strokes and DVTs in the past when his INR is not therapeutic on warfarin.  He presents with his wife tonight for evaluation of bright red blood per rectum.  They report that he had a little bit of blood in the stool over the last day or so but it was a very small amount and he did not even mention it to her.  However this evening he had a relatively large volume of bright red blood in the stool that she also witnessed.  He had a little bit of cramping abdominal pain earlier tonight but that has resolved.  He has not had any additional bloody bowel movement since coming to the emergency department about 5 hours ago.  Nothing in particular made the symptoms better or worse.  He is scheduled for an INR check in a little over 24 hours and is not certain what his current INR is.  He is not having any other bleeding.  He is in no distress at this time although his wife reports that he has a little bit of dementia but she is little bit more confused over the last couple of days that he has in the past.  He is not having any focal neurological deficits.  He is awake and alert for me and making jokes and responding appropriately to my questions.  He has COPD but has not had any increased respiratory difficulty.  He and his wife deny any headache, sore throat, chest pain, shortness of breath, cough greater than baseline, exposure to COVID-19 patients, and dysuria.  The onset of the bloody stool was acute and severe but he has not had any ongoing issues.          Past Medical History:  Diagnosis Date  . Anemia   . Antiphospholipid antibody syndrome ([place])   . Arthritis   . DVT (deep venous thrombosis) ([place])   . Essential hypertension   . Gastric ulcer   . GERD (gastroesophageal reflux disease)   . History of CMV   . Hyperlipidemia   . Hyperparathyroidism ([place])   . Pedal edema   . Peripheral vascular disease ([place])   . Rosacea   . Status post kidney transplant 2001  . Stroke ([place])   . TIA (transient ischemic attack)   . Vascular dementia without behavioral disturbance [facility])     Patient Active Problem List   Diagnosis Date Noted  . GI bleed 02/23/2015  . Acute blood loss anemia 02/23/2015    Past Surgical History:  Procedure Laterality Date  . CHOLECYSTECTOMY    . COLONOSCOPY WITH PROPOFOL N/A 03/06/2015   Procedure: COLONOSCOPY WITH PROPOFOL;  Surgeon: Josefine Class, MD;  Location[facility] ENDOSCOPY;  Service: Endoscopy;  Laterality: N/A;  . ELBOW SURGERY    . ESOPHAGOGASTRODUODENOSCOPY Left 03/01/2015   Procedure: ESOPHAGOGASTRODUODENOSCOPY (EGD);  Surgeon: Hulen Luster, MD;  Location[facility] ENDOSCOPY;  Service: Endoscopy;  Laterality: Left;  . ESOPHAGOGASTRODUODENOSCOPY (EGD) WITH PROPOFOL  03/06/2015   Procedure: ESOPHAGOGASTRODUODENOSCOPY (EGD) WITH PROPOFOL;  Surgeon: Josefine Class, MD;  Location: [facility] ENDOSCOPY;  Service: Endoscopy;;  .  ESOPHAGOGASTRODUODENOSCOPY (EGD) WITH PROPOFOL N/A 05/25/2015   Procedure: ESOPHAGOGASTRODUODENOSCOPY (EGD) WITH PROPOFOL;  Surgeon: Josefine Class, MD;  Location: [facility] ENDOSCOPY;  Service: Endoscopy;  Laterality: N/A;  . KIDNEY TRANSPLANT    . KNEE ARTHROSCOPY      Prior to Admission medications   Medication Sig Start Date End Date Taking? Authorizing Provider  albuterol (PROVENTIL HFA) 108 (90 Base) MCG/ACT inhaler Inhale into the lungs. 06/01/16 08/14/18 Yes [provider]  amLODipine (NORVASC) 10 MG tablet Take 10 mg by mouth daily.   Yes [provider]   budesonide-formoterol (SYMBICORT) 80-4.5 MCG/ACT inhaler Inhale 2 puffs into the lungs 2 (two) times daily.   Yes [provider]  buPROPion (WELLBUTRIN XL) 150 MG 24 hr tablet Take 300 mg by mouth daily.  06/16/16  Yes [provider]  calcitRIOL (ROCALTROL) 0.25 MCG capsule Take 0.25 mcg by mouth at bedtime.   Yes [provider]  citalopram (CELEXA) 20 MG tablet Take 20 mg by mouth daily.   Yes [provider]  COUMADIN 1 MG tablet Take 2-3 mg by mouth as directed. 3 mg Monday, Weds, Friday. 2 mg Tues, Thursday, Sat and Sun 02/22/17  Yes [provider]  dicyclomine (BENTYL) 10 MG capsule Take 10 mg by mouth 3 (three) times daily before meals.  01/01/18  Yes [provider]  diphenoxylate-atropine (LOMOTIL) 2.5-0.025 MG tablet Take 1 tablet by mouth 4 (four) times daily as needed for diarrhea or loose stools.   Yes [provider]  donepezil (ARICEPT) 10 MG tablet Take 10 mg by mouth at bedtime.   Yes [provider]  furosemide (LASIX) 40 MG tablet Take 40 mg by mouth daily as needed.  02/08/17  Yes [provider]  labetalol (NORMODYNE) 200 MG tablet Take 200 mg by mouth 2 (two) times daily.    Yes [provider]  magnesium oxide (MAG-OX) 400 MG tablet TAKE ONE TABLET BY MOUTH TWICE DAILY 02/22/17  Yes [provider]  Multiple Vitamin (MULTI-VITAMINS) TABS Take 1 tablet by mouth daily.    Yes [provider]  mycophenolate (CELLCEPT) 250 MG capsule Take 250-500 mg by mouth 2 (two) times daily. 250 mg in the am and 500 mg at bedtime   Yes [provider]  pantoprazole (PROTONIX) 40 MG tablet Take 1 tablet (40 mg total) by mouth 2 (two) times daily before a meal. Patient taking differently: Take 40 mg by mouth daily.  03/06/15  Yes Epifanio Lesches, MD  predniSONE (DELTASONE) 20 MG tablet Take 1 tablet (20 mg total) by mouth daily with breakfast. Patient taking differently: Take 5  mg by mouth daily with breakfast.  03/08/15  Yes Epifanio Lesches, MD  tacrolimus (PROGRAF) 0.5 MG capsule Take 1 capsule by mouth 2 (two) times a day. 04/28/18 04/28/19 Yes [provider]  fluorouracil (EFUDEX) 5 % cream  02/23/17   [provider]    Allergies Losartan and Statins  No family history on file.  Social History Social History   Tobacco Use  . Smoking status: Former Smoker    Packs/day: 1.00    Years: 30.00    Pack years: 30.00    Types: Cigarettes    Quit date: 1999    Years since quitting: 21.5  . Smokeless tobacco: Never Used  Substance Use Topics  . Alcohol use: No  . Drug use: No    Review of Systems Constitutional: No fever/chills Eyes: No visual changes. ENT: No sore throat. Cardiovascular: Denies  chest pain. Respiratory: Denies shortness of breath. Gastrointestinal: Bright red blood per rectum as described above with some cramping abdominal pain that has resolved. Genitourinary: Negative for dysuria. Musculoskeletal: Negative for neck pain.  Negative for back pain. Integumentary: Negative for rash. Neurological: Negative for headaches, focal weakness or numbness.   ____________________________________________   PHYSICAL EXAM:  VITAL SIGNS: ED Triage Vitals  Enc Vitals Group     BP 08/13/18 2053 (!) 160/74     Pulse Rate 08/13/18 2053 68     Resp 08/13/18 2053 18     Temp 08/13/18 2053 99.1 F (37.3 C)     Temp Source 08/13/18 2053 Oral     SpO2 08/13/18 2053 97 %     Weight 08/13/18 2118 57.2 kg (126 lb)     Height 08/13/18 2118 1.6 m (5\' 3" )     Head Circumference --      Peak Flow --      Pain Score 08/13/18 2118 1     Pain Loc --      Pain Edu? --      Excl. in [place]? --     Constitutional: Alert and oriented.  Elderly but generally well appearing and in no acute distress. Eyes: Conjunctivae are normal.  Head: Atraumatic. Nose: No congestion/rhinnorhea. Mouth/Throat: Mucous membranes are moist. Neck: No  stridor.  No meningeal signs.   Cardiovascular: Normal rate, regular rhythm. Good peripheral circulation. Grossly normal heart sounds. Respiratory: Normal respiratory effort.  No retractions. No audible wheezing. Gastrointestinal: Soft and nontender. No distention.  Rectal: There is some crusted and dried blood around the patient's anus.  Exam was notable for some brown stool but also a little bit of bright red blood on my finger and strongly Hemoccult positive test.  ED nurse was present with me throughout the exam.  The patient does have some external hemorrhoids that do not appear to be bleeding. Musculoskeletal: No lower extremity tenderness nor edema. No gross deformities of extremities. Neurologic:  Normal speech and language. No gross focal neurologic deficits are appreciated.  Skin:  Skin is warm, dry and intact. No rash noted.   ____________________________________________   LABS (all labs ordered are listed, but only abnormal results are displayed)  Labs Reviewed  COMPREHENSIVE METABOLIC PANEL - Abnormal; Notable for the following components:      Result Value   CO2 19 (*)    Glucose, Bld 112 (*)    BUN 33 (*)    Creatinine, Ser 3.02 (*)    Total Protein 6.1 (*)    GFR calc non Af Amer 20 (*)    GFR calc Af Amer 23 (*)    All other components within normal limits  CBC - Abnormal; Notable for the following components:   RBC 3.91 (*)    Hemoglobin 11.1 (*)    HCT 36.0 (*)    Platelets 147 (*)    All other components within normal limits  PROTIME-INR - Abnormal; Notable for the following components:   Prothrombin Time 23.8 (*)    INR 2.2 (*)    All other components within normal limits  APTT - Abnormal; Notable for the following components:   aPTT 57 (*)    All other components within normal limits  SARS CORONAVIRUS 2 (HOSPITAL ORDER, [place] LAB)  HIV ANTIBODY (ROUTINE TESTING W REFLEX)  BASIC METABOLIC PANEL  CBC  PROTIME-INR  HEMOGLOBIN  AND HEMATOCRIT, BLOOD  HEMOGLOBIN AND HEMATOCRIT, BLOOD  HEMOGLOBIN AND HEMATOCRIT, BLOOD  HEMOGLOBIN AND HEMATOCRIT, BLOOD  OCCULT BLOOD X 1 CARD TO LAB, STOOL  POC OCCULT BLOOD, ED  TYPE AND SCREEN   ____________________________________________  EKG  ED ECG REPORT I, Hinda Kehr, the attending physician, personally viewed and interpreted this ECG.  Date: 08/14/2018 EKG Time: 3:30 AM Rate: 69 Rhythm: normal sinus rhythm QRS Axis: normal Intervals: normal ST/T Wave abnormalities: normal Narrative Interpretation: no evidence of acute ischemia  ____________________________________________  RADIOLOGY   ED MD interpretation: No indication for emergent imaging  Official radiology report(s): No results found.  ____________________________________________   PROCEDURES   Procedure(s) performed (including Critical Care):  Procedures   ____________________________________________   INITIAL IMPRESSION / MDM / ASSESSMENT AND PLAN / ED COURSE  As part of my medical decision making, I reviewed the following data within the electronic MEDICAL RECORD NUMBER History obtained from family, Nursing notes reviewed and incorporated, Labs reviewed , EKG interpreted , Old chart reviewed, Discussed with admitting physician (Dr. Sidney Ace) and Notes from prior ED visits      Differential diagnosis includes, but is not limited to, nonspecific bleeding secondary to anticoagulation on warfarin, diverticular bleed, AV malformation, neoplasm, diverticulosis.  [name] in no acute distress at this time and has no tenderness to palpation of his abdomen.  There is no indication for emergent imaging or blood transfusion as his hemoglobin is 11.1.  He does have bright red blood per rectum on digital rectal exam and a strongly Hemoccult positive guaiac sample.  He has chronic kidney disease and his creatinine is consistent with his prior results within the [facility] system.  No acute or gross  metabolic or electrolyte abnormalities.  [name] stable for admission.  I am sending a rapid coronavirus swab in case GI needs to perform any sort of procedure within the next couple of days.  I have also checking coagulation studies.  I have discussed the case with Dr. Sidney Ace with the hospitalist service who will admit after the results of the coagulation studies and coronavirus swab are back.  Patient and wife are comfortable with the plan.  Clinical Course as of Aug 14 435  Tue Aug 14, 2018  0401 INR(!): 2.2 [CF]  0437 SARS Coronavirus 2: NEGATIVE [CF]    Clinical Course User Index [CF] Hinda Kehr, MD     ____________________________________________  FINAL CLINICAL IMPRESSION(S) / ED DIAGNOSES  Final diagnoses:  Rectal bleeding  Warfarin anticoagulation  Antiphospholipid antibody syndrome (HCC)     MEDICATIONS GIVEN DURING THIS VISIT:  Medications  pantoprazole (PROTONIX) injection 40 mg (has no administration in time range)  predniSONE (DELTASONE) tablet 5 mg (has no administration in time range)  amLODipine (NORVASC) tablet 10 mg (has no administration in time range)  mometasone-formoterol (DULERA) 100-5 MCG/ACT inhaler 2 puff (has no administration in time range)  buPROPion (WELLBUTRIN XL) 24 hr tablet 300 mg (has no administration in time range)  calcitRIOL (ROCALTROL) capsule 0.25 mcg (has no administration in time range)  citalopram (CELEXA) tablet 20 mg (has no administration in time range)  warfarin (COUMADIN) tablet 2-3 mg (has no administration in time range)  dicyclomine (BENTYL) capsule 10 mg (has no administration in time range)  donepezil (ARICEPT) tablet 10 mg (has no administration in time range)  labetalol (NORMODYNE) tablet 200 mg (has no administration in time range)  magnesium oxide (MAG-OX) tablet 400 mg (has no administration in time range)  mycophenolate (CELLCEPT) capsule 250-500 mg (has no administration in time range)  tacrolimus (PROGRAF)  capsule 0.5 mg (has  no administration in time range)  sodium chloride flush (NS) 0.9 % injection 3 mL (has no administration in time range)  sodium chloride flush (NS) 0.9 % injection 3 mL (has no administration in time range)  0.9 %  sodium chloride infusion (has no administration in time range)  acetaminophen (TYLENOL) tablet 650 mg (has no administration in time range)    Or  acetaminophen (TYLENOL) suppository 650 mg (has no administration in time range)  polyethylene glycol (MIRALAX / GLYCOLAX) packet 17 g (has no administration in time range)  ondansetron (ZOFRAN) tablet 4 mg (has no administration in time range)    Or  ondansetron (ZOFRAN) injection 4 mg (has no administration in time range)  ipratropium-albuterol (DUONEB) 0.5-2.5 (3) MG/3ML nebulizer solution 3 mL (has no administration in time range)  hydrALAZINE (APRESOLINE) injection 10 mg (has no administration in time range)  labetalol (NORMODYNE) injection 10 mg (has no administration in time range)     ED Discharge Orders    None      *Please note:  LENELL LAMA was evaluated in Emergency Department on 08/14/2018 for the symptoms described in the history of present illness. He was evaluated in the context of the global COVID-19 pandemic, which necessitated consideration that the patient might be at risk for infection with the SARS-CoV-2 virus that causes COVID-19. Institutional protocols and algorithms that pertain to the evaluation of patients at risk for COVID-19 are in a state of rapid change based on information released by regulatory bodies including the CDC and federal and state organizations. These policies and algorithms were followed during the patient's care in the ED.  Some ED evaluations and interventions may be delayed as a result of limited staffing during the pandemic.*  Note:  This document was prepared using Dragon voice recognition software and may include unintentional dictation errors.   Hinda Kehr, MD  08/14/18 [phone]

## 2018-08-14 NOTE — Progress Notes (Signed)
[place] [facility] [place] NAME: Ceejay Kegley    MR#:  [id number]  DATE OF BIRTH:  [birth year]  SUBJECTIVE:  Pt  came in with bleeding per rectum. Denies any abdominal pain. He just returned from the bathroom had some streaks of blood on the toilet paper. Denies any abdominal pain.  Patient had colonoscopy in 2017 at [facility] polyp was removed. Some diverticulosis was noted  REVIEW OF SYSTEMS:   Review of Systems  Constitutional: Negative for chills, fever and weight loss.  HENT: Negative for ear discharge, ear pain and nosebleeds.   Eyes: Negative for blurred vision, pain and discharge.  Respiratory: Negative for sputum production, shortness of breath, wheezing and stridor.   Cardiovascular: Negative for chest pain, palpitations, orthopnea and PND.  Gastrointestinal: Positive for blood in stool. Negative for abdominal pain, diarrhea, nausea and vomiting.  Genitourinary: Negative for frequency and urgency.  Musculoskeletal: Negative for back pain and joint pain.  Neurological: Positive for weakness. Negative for sensory change, speech change and focal weakness.  Psychiatric/Behavioral: Negative for depression and hallucinations. The patient is not nervous/anxious.    Tolerating Diet:clear Tolerating PT: ambulatory  DRUG ALLERGIES:   Allergies  Allergen Reactions  . Losartan Cough  . Statins Nausea And Vomiting and Other (See Comments)    Reaction:  Muscle pain     VITALS:  Blood pressure (!) 143/77, pulse 62, temperature 98.3 F (36.8 C), temperature source Oral, resp. rate 16, height 5\' 3"  (1.6 m), weight 57.2 kg, SpO2 97 %.  PHYSICAL EXAMINATION:   Physical Exam  GENERAL:  68 y.o.-year-old patient lying in the bed with no acute distress. weak EYES: Pupils equal, round, reactive to light and accommodation. No scleral icterus. Extraocular muscles intact.  HEENT: Head atraumatic, normocephalic. Oropharynx and nasopharynx clear.   NECK:  Supple, no jugular venous distention. No thyroid enlargement, no tenderness.  LUNGS: Normal breath sounds bilaterally, no wheezing, rales, rhonchi. No use of accessory muscles of respiration.  CARDIOVASCULAR: S1, S2 normal. No murmurs, rubs, or gallops.  ABDOMEN: Soft, nontender, nondistended. Bowel sounds present. No organomegaly or mass.  EXTREMITIES: No cyanosis, clubbing or edema b/l.    NEUROLOGIC: Cranial nerves II through XII are intact. No focal Motor or sensory deficits b/l.   PSYCHIATRIC:  patient is alert and oriented x 3.  SKIN: No obvious rash, lesion, or ulcer.   LABORATORY PANEL:  CBC Recent Labs  Lab 08/14/18 0600  08/14/18 1246  WBC 6.1  --   --   HGB 10.8*   < > 11.2*  HCT 35.1*  --  36.1*  PLT 140*  --   --    < > = values in this interval not displayed.    Chemistries  Recent Labs  Lab 08/13/18 2124 08/14/18 0600  NA 139 142  K 3.9 3.5  CL 111 112*  CO2 19* 22  GLUCOSE 112* 86  BUN 33* 31*  CREATININE 3.02* 3.10*  CALCIUM 9.0 9.2  AST 17  --   ALT 13  --   ALKPHOS 84  --   BILITOT 0.3  --    Cardiac Enzymes No results for input(s): TROPONINI in the last 168 hours. RADIOLOGY:  No results found. ASSESSMENT AND PLAN:  Timur Nibert  is a 68 y.o. male with a known history of antiphospholipid antibody syndrome, multiple strokes and DVTs when INR became subtherapeutic on Coumadin.  He presented to the emergency room with his wife at bedside for  evaluation of bright red blood per rectum.   1.  GI bleed appears lower ?diverticular - On anticoagulation therapy with Coumadin for history of stroke and DVT with antiphospholipid antibody - We will continue Coumadin at this time as hemoglobin and hematocrit are stable given patient's extensive history of multiple strokes and DVTs when Coumadin becomes subtherapeutic. -  consult Dr. Allen Norris -- no active bleeding. Hemoglobin stable. Start clear liquid. No G.I. evaluation plan for now. - hgb 11.2 - PPI  therapy initiated  2. antiphospholipid antibody syndrome - continue Coumadin given patient's history of multiple strokes and DVTs -  discussed with Dr. [name] who recommends continue Coumadin.  3.  Hypertension - Labetalol and Norvasc continued - treat persistent hypertension with IV hydralazine  4.  History of renal transplant - CellCept and Prograf continued - BUN is 33 with creatinine 3.02.   -Dr. Juleen China to see patient  5.  Dementia - Continue Aricept  6.  COPD - Long-acting corticosteroid therapy restarted - P.o. prednisone continue - DuoNeb every 6 hours as needed for shortness of breath and wheezing   Case discussed with Care Management/Social Worker. Management plans discussed with the patient  Spoke with Mrs. michelsen on the phone and updated her  CODE STATUS: Full  DVT Prophylaxis: coumadin  TOTAL TIME TAKING CARE OF THIS PATIENT: *30* minutes.  >50% time spent on counselling and coordination of care  POSSIBLE D/C IN *1-2* DAYS, DEPENDING ON CLINICAL CONDITION.  Note: This dictation was prepared with Dragon dictation along with smaller phrase technology. Any transcriptional errors that result from this process are unintentional.  Fritzi Mandes M.D on 08/14/2018 at 2:48 PM  Between 7am to 6pm - Pager - [phone]  After 6pm go to [email or web address] - password [email or web address] [facility]  Office  [phone]  CC: Primary care physician; Idelle Crouch, MDPatient ID: Ocie Doyne, male   DOB: 1950-09-19, 68 y.o.   MRN: [id number]

## 2018-08-14 NOTE — H&P (Signed)
[facility] NAME: Eric Gill    MR#:  [id number]  DATE OF BIRTH:  1950/10/21  DATE OF ADMISSION:  08/14/2018  PRIMARY CARE PHYSICIAN: Idelle Crouch, MD   REQUESTING/REFERRING PHYSICIAN: Hinda Kehr, MD  CHIEF COMPLAINT:   Chief Complaint  Patient presents with   Rectal Bleeding    HISTORY OF PRESENT ILLNESS:  Eric Gill  is a 68 y.o. male with a known history of antiphospholipid antibody syndrome, multiple strokes and DVTs when INR became subtherapeutic on Coumadin.  He presented to the emergency room with his wife at bedside for evaluation of bright red blood per rectum.  She reports he had a small amount of blood in stool over the last 1 to 2 days however earlier in the evening on 08/13/2018 he had a large amount of bright red blood in his stool.  He has not had subsequent episodes of bloody stool.  He currently denies experiencing abdominal pain.  However his wife reports he did complain of mild cramping earlier in the evening.  He denies having nausea or vomiting.  No hematemesis.  He denies shortness of breath or weakness.  He denies dizziness on standing.  He denies chest pain.  No recent fevers or chills.  He denies diarrhea.  [name] wife is at the bedside and reports he does have early stages of dementia and becomes more confused at night.  She has noted he is more confused over the last couple of days.  However, during my exam of the patient, he is awake, alert, and oriented x4.  [name] wife reports he had a previous GI bleed in 2017 and underwent endoscopy at that time and was diagnosed with gastric ulcer.  Currently his hemoglobin is 11.1 with hematocrit 36.  Platelet count is 147.  PT is 23.8 and INR is 2.2.  We will admit him to the hospitalist service for further management with consultation to gastroenterology.  PAST MEDICAL HISTORY:   Past Medical History:  Diagnosis Date   Anemia    Antiphospholipid  antibody syndrome (HCC)    Arthritis    DVT (deep venous thrombosis) (HCC)    Essential hypertension    Gastric ulcer    GERD (gastroesophageal reflux disease)    History of CMV    Hyperlipidemia    Hyperparathyroidism (HCC)    Pedal edema    Peripheral vascular disease (HCC)    Rosacea    Status post kidney transplant 2001   Stroke [facility])    TIA (transient ischemic attack)    Vascular dementia without behavioral disturbance ([place])     PAST SURGICAL HISTORY:   Past Surgical History:  Procedure Laterality Date   CHOLECYSTECTOMY     COLONOSCOPY WITH PROPOFOL N/A 03/06/2015   Procedure: COLONOSCOPY WITH PROPOFOL;  Surgeon: Josefine Class, MD;  Location: [facility] ENDOSCOPY;  Service: Endoscopy;  Laterality: N/A;   ELBOW SURGERY     ESOPHAGOGASTRODUODENOSCOPY Left 03/01/2015   Procedure: ESOPHAGOGASTRODUODENOSCOPY (EGD);  Surgeon: Hulen Luster, MD;  Location: [facility] ENDOSCOPY;  Service: Endoscopy;  Laterality: Left;   ESOPHAGOGASTRODUODENOSCOPY (EGD) WITH PROPOFOL  03/06/2015   Procedure: ESOPHAGOGASTRODUODENOSCOPY (EGD) WITH PROPOFOL;  Surgeon: Josefine Class, MD;  Location: [facility] ENDOSCOPY;  Service: Endoscopy;;   ESOPHAGOGASTRODUODENOSCOPY (EGD) WITH PROPOFOL N/A 05/25/2015   Procedure: ESOPHAGOGASTRODUODENOSCOPY (EGD) WITH PROPOFOL;  Surgeon: Josefine Class, MD;  Location: [facility] ENDOSCOPY;  Service: Endoscopy;  Laterality: N/A;   KIDNEY TRANSPLANT     KNEE  ARTHROSCOPY      SOCIAL HISTORY:   Social History   Tobacco Use   Smoking status: Former Smoker    Packs/day: 1.00    Years: 30.00    Pack years: 30.00    Types: Cigarettes    Quit date: 1999    Years since quitting: 21.5   Smokeless tobacco: Never Used  Substance Use Topics   Alcohol use: No    FAMILY HISTORY:  No family history on file.  DRUG ALLERGIES:   Allergies  Allergen Reactions   Losartan Cough   Statins Nausea And Vomiting and Other (See Comments)    Reaction:  Muscle  pain     REVIEW OF SYSTEMS:   Review of Systems  Constitutional: Negative for chills, fever, malaise/fatigue and weight loss.  HENT: Negative for congestion, nosebleeds, sinus pain and sore throat.   Eyes: Negative for blurred vision and double vision.  Respiratory: Negative for cough, sputum production, shortness of breath and wheezing.   Cardiovascular: Negative for chest pain, palpitations and leg swelling.  Gastrointestinal: Positive for blood in stool. Negative for abdominal pain, constipation, diarrhea, heartburn, nausea and vomiting.  Genitourinary: Negative for dysuria, flank pain, frequency, hematuria and urgency.  Musculoskeletal: Negative for falls, joint pain and myalgias.  Skin: Negative.  Negative for itching and rash.  Neurological: Negative for dizziness, tingling, tremors, loss of consciousness, weakness and headaches.  Psychiatric/Behavioral: Negative.  Negative for depression.     MEDICATIONS AT HOME:   Prior to Admission medications   Medication Sig Start Date End Date Taking? Authorizing Provider  albuterol (PROVENTIL HFA) 108 (90 Base) MCG/ACT inhaler Inhale into the lungs. 06/01/16 08/14/18 Yes [provider]  amLODipine (NORVASC) 10 MG tablet Take 10 mg by mouth daily.   Yes [provider]  budesonide-formoterol (SYMBICORT) 80-4.5 MCG/ACT inhaler Inhale 2 puffs into the lungs 2 (two) times daily.   Yes [provider]  buPROPion (WELLBUTRIN XL) 150 MG 24 hr tablet Take 300 mg by mouth daily.  06/16/16  Yes [provider]  calcitRIOL (ROCALTROL) 0.25 MCG capsule Take 0.25 mcg by mouth at bedtime.   Yes [provider]  citalopram (CELEXA) 20 MG tablet Take 20 mg by mouth daily.   Yes [provider]  COUMADIN 1 MG tablet Take 2-3 mg by mouth as directed. 3 mg Monday, Weds, Friday. 2 mg Tues, Thursday, Sat and Sun 02/22/17  Yes [provider]  dicyclomine (BENTYL) 10 MG capsule Take 10 mg by mouth 3  (three) times daily before meals.  01/01/18  Yes [provider]  diphenoxylate-atropine (LOMOTIL) 2.5-0.025 MG tablet Take 1 tablet by mouth 4 (four) times daily as needed for diarrhea or loose stools.   Yes [provider]  donepezil (ARICEPT) 10 MG tablet Take 10 mg by mouth at bedtime.   Yes [provider]  furosemide (LASIX) 40 MG tablet Take 40 mg by mouth daily as needed.  02/08/17  Yes [provider]  labetalol (NORMODYNE) 200 MG tablet Take 200 mg by mouth 2 (two) times daily.    Yes [provider]  magnesium oxide (MAG-OX) 400 MG tablet TAKE ONE TABLET BY MOUTH TWICE DAILY 02/22/17  Yes [provider]  Multiple Vitamin (MULTI-VITAMINS) TABS Take 1 tablet by mouth daily.    Yes [provider]  mycophenolate (CELLCEPT) 250 MG capsule Take 250-500 mg by mouth 2 (two) times daily. 250 mg in the am and 500 mg at bedtime   Yes [provider]  pantoprazole (PROTONIX) 40 MG tablet Take 1 tablet (40 mg total) by mouth 2 (two) times daily before a meal. Patient taking differently: Take 40 mg by mouth daily.  03/06/15  Yes Epifanio Lesches, MD  predniSONE (DELTASONE) 20 MG tablet Take 1 tablet (20 mg total) by mouth daily with breakfast. Patient taking differently: Take 5 mg by mouth daily with breakfast.  03/08/15  Yes Epifanio Lesches, MD  tacrolimus (PROGRAF) 0.5 MG capsule Take 1 capsule by mouth 2 (two) times a day. 04/28/18 04/28/19 Yes [provider]  fluorouracil (EFUDEX) 5 % cream  02/23/17   [provider]      VITAL SIGNS:  Blood pressure (!) 191/87, pulse 78, temperature 99.1 F (37.3 C), temperature source Oral, resp. rate 16, height 5\' 3"  (1.6 m), weight 57.2 kg, SpO2 96 %.  PHYSICAL EXAMINATION:  Physical Exam  GENERAL:  68 y.o.-year-old patient lying in the bed with no acute distress.  EYES: Pupils equal, round, reactive to light and accommodation. No scleral icterus. Extraocular  muscles intact.  HEENT: Head atraumatic, normocephalic. Oropharynx and nasopharynx clear.  NECK:  Supple, no jugular venous distention. No thyroid enlargement, no tenderness.  LUNGS: Normal breath sounds bilaterally, no wheezing, rales,rhonchi. No use of accessory muscles of respiration.  CARDIOVASCULAR: Regular rate and rhythm, S1, S2 normal. No murmurs, rubs, or gallops.  ABDOMEN: Soft, nondistended, nontender. Bowel sounds present. No organomegaly or mass.  EXTREMITIES: No pedal edema, cyanosis, or clubbing.  NEUROLOGIC: Cranial nerves II through XII are intact. Muscle strength 5/5 in all extremities. Sensation intact. Gait not checked.  PSYCHIATRIC: The patient is alert and oriented x 3.  Normal affect and good eye contact. SKIN: No obvious rash, lesion, or ulcer.   LABORATORY PANEL:   CBC Recent Labs  Lab 08/13/18 2124  WBC 5.8  HGB 11.1*  HCT 36.0*  PLT 147*   ------------------------------------------------------------------------------------------------------------------  Chemistries  Recent Labs  Lab 08/13/18 2124  NA 139  K 3.9  CL 111  CO2 19*  GLUCOSE 112*  BUN 33*  CREATININE 3.02*  CALCIUM 9.0  AST 17  ALT 13  ALKPHOS 84  BILITOT 0.3   ------------------------------------------------------------------------------------------------------------------  Cardiac Enzymes No results for input(s): TROPONINI in the last 168 hours. ------------------------------------------------------------------------------------------------------------------  RADIOLOGY:  No results found.    IMPRESSION AND PLAN:   1.  GI bleed - On anticoagulation therapy with Coumadin for history of stroke and DVT with antiphospholipid antibody - We will continue Coumadin at this time as hemoglobin and hematocrit are stable given patient's extensive history of multiple strokes and DVTs when Coumadin becomes subtherapeutic. - We will consult Dr. Vicente Males with gastroenterology for further  recommendations and guidance. - We will monitor hemoglobin and hematocrit closely with labs every 6 hours. -We will get stool for occult blood - PPI therapy initiated  2. antiphospholipid antibody syndrome - We will continue Coumadin given patient's history of multiple strokes and DVTs - Gastroenterology has been consulted for further evaluation and recommendations regarding his admitting diagnosis of GI bleed and continued Coumadin therapy.  May need to consult hematology.  Patient sees hematologist at [facility].  3.  Hypertension - Labetalol and Norvasc continued - We will treat persistent hypertension with IV hydralazine  4.  History of renal transplant - CellCept and Prograf continued - BUN is 33 with creatinine 3.02.  Will consult nephrology for acute renal failure.  5.  Dementia - Continue Aricept  6.  COPD - Long-acting corticosteroid therapy restarted - P.o. prednisone  continue - DuoNeb every 6 hours as needed for shortness of breath and wheezing  DVT prophylaxis with SCDs and PPI therapy have been initiated    All the records are reviewed and case discussed with ED provider. The plan of care was discussed in details with the patient (and family). I answered all questions. The patient agreed to proceed with the above mentioned plan. Further management will depend upon hospital course.   CODE STATUS: Full code  TOTAL TIME TAKING CARE OF THIS PATIENT: 45 minutes.    [place] on 08/14/2018 at 4:10 AM  Pager - [phone]  After 6pm go to [email or web address] - Proofreader  [facility]  Office  [phone]  CC: Primary care physician; Idelle Crouch, MD   Note: This dictation was prepared with Dragon dictation along with smaller phrase technology. Any transcriptional errors that result from this process are unintentional.

## 2018-08-14 NOTE — ED Notes (Signed)
ED TO INPATIENT HANDOFF REPORT  ED Nurse Name and Phone #:  Dorothyann Gibbs [phone]  S Name/Age/Gender Eric Gill 69 y.o. male Room/Bed: ED01A/ED01A  Code Status   Code Status: Full Code  Home/SNF/Other Home Patient oriented to: self, place and situation Is this baseline? Yes   Triage Complete: Triage complete  Chief Complaint Rectal Bleed  Triage Note Pt presents to ED with bright red blood noted from his rectum today. Pt has a hx of the same but states last time the blood was dark and due to bleeding ulcer. Pt does have hemorrhoids currently. Denies recent constipation.  Pt does report having cramping pain in his abd.     Allergies Allergies  Allergen Reactions  . Losartan Cough  . Statins Nausea And Vomiting and Other (See Comments)    Reaction:  Muscle pain     Level of Care/Admitting Diagnosis ED Disposition    ED Disposition Condition [facility] Area: [place] [[id number]]  Level of Care: Med-Surg [16]  Covid Evaluation: Confirmed COVID Negative  Diagnosis: GI bleed [[id number]]  Admitting Physician: Mayer Camel [[id number]]  Attending Physician: Mayer Camel [[id number]]  Estimated length of stay: past midnight tomorrow  Certification:: I certify this patient will need inpatient services for at least 2 midnights  PT Class (Do Not Modify): Inpatient [101]  PT Acc Code (Do Not Modify): Private [1]       B Medical/Surgery History Past Medical History:  Diagnosis Date  . Anemia   . Antiphospholipid antibody syndrome ([place])   . Arthritis   . DVT (deep venous thrombosis) ([place])   . Essential hypertension   . Gastric ulcer   . GERD (gastroesophageal reflux disease)   . History of CMV   . Hyperlipidemia   . Hyperparathyroidism ([place])   . Pedal edema   . Peripheral vascular disease ([place])   . Rosacea   . Status post kidney transplant 2001  . Stroke ([place])   . TIA (transient ischemic attack)   . Vascular dementia without  behavioral disturbance [facility])    Past Surgical History:  Procedure Laterality Date  . CHOLECYSTECTOMY    . COLONOSCOPY WITH PROPOFOL N/A 03/06/2015   Procedure: COLONOSCOPY WITH PROPOFOL;  Surgeon: Josefine Class, MD;  Location: [facility] ENDOSCOPY;  Service: Endoscopy;  Laterality: N/A;  . ELBOW SURGERY    . ESOPHAGOGASTRODUODENOSCOPY Left 03/01/2015   Procedure: ESOPHAGOGASTRODUODENOSCOPY (EGD);  Surgeon: Hulen Luster, MD;  Location: [facility] ENDOSCOPY;  Service: Endoscopy;  Laterality: Left;  . ESOPHAGOGASTRODUODENOSCOPY (EGD) WITH PROPOFOL  03/06/2015   Procedure: ESOPHAGOGASTRODUODENOSCOPY (EGD) WITH PROPOFOL;  Surgeon: Josefine Class, MD;  Location: [facility] ENDOSCOPY;  Service: Endoscopy;;  . ESOPHAGOGASTRODUODENOSCOPY (EGD) WITH PROPOFOL N/A 05/25/2015   Procedure: ESOPHAGOGASTRODUODENOSCOPY (EGD) WITH PROPOFOL;  Surgeon: Josefine Class, MD;  Location: [facility] ENDOSCOPY;  Service: Endoscopy;  Laterality: N/A;  . KIDNEY TRANSPLANT    . KNEE ARTHROSCOPY       A IV Location/Drains/Wounds Patient Lines/Drains/Airways Status   Active Line/Drains/Airways    Name:   Placement date:   Placement time:   Site:   Days:   Peripheral IV 08/14/18 Left Forearm   08/14/18    0313    Forearm   less than 1          Intake/Output Last 24 hours No intake or output data in the 24 hours ending 08/14/18 0453  Labs/Imaging Results for orders placed or performed during the hospital encounter of 08/14/18 (from the  past 48 hour(s))  Comprehensive metabolic panel     Status: Abnormal   Collection Time: 08/13/18  9:24 PM  Result Value Ref Range   Sodium 139 135 - 145 mmol/L   Potassium 3.9 3.5 - 5.1 mmol/L   Chloride 111 98 - 111 mmol/L   CO2 19 (L) 22 - 32 mmol/L   Glucose, Bld 112 (H) 70 - 99 mg/dL   BUN 33 (H) 8 - 23 mg/dL   Creatinine, Ser 3.02 (H) 0.61 - 1.24 mg/dL   Calcium 9.0 8.9 - 10.3 mg/dL   Total Protein 6.1 (L) 6.5 - 8.1 g/dL   Albumin 3.8 3.5 - 5.0 g/dL   AST 17 15 - 41 U/L   ALT 13 0  - 44 U/L   Alkaline Phosphatase 84 38 - 126 U/L   Total Bilirubin 0.3 0.3 - 1.2 mg/dL   GFR calc non Af Amer 20 (L) >60 mL/min   GFR calc Af Amer 23 (L) >60 mL/min   Anion gap 9 5 - 15    Comment: Performed at [facility], [address] [place], [place] [address]  CBC     Status: Abnormal   Collection Time: 08/13/18  9:24 PM  Result Value Ref Range   WBC 5.8 4.0 - 10.5 K/uL   RBC 3.91 (L) 4.22 - 5.81 MIL/uL   Hemoglobin 11.1 (L) 13.0 - 17.0 g/dL   HCT 36.0 (L) 39.0 - 52.0 %   MCV 92.1 80.0 - 100.0 fL   MCH 28.4 26.0 - 34.0 pg   MCHC 30.8 30.0 - 36.0 g/dL   RDW 13.7 11.5 - 15.5 %   Platelets 147 (L) 150 - 400 K/uL    Comment: REPEATED TO VERIFY SPECIMEN CHECKED FOR CLOTS    nRBC 0.0 0.0 - 0.2 %    Comment: Performed at [facility], [address] [place], [place] [address]  Type and screen [place]     Status: None   Collection Time: 08/13/18  9:24 PM  Result Value Ref Range   ABO/RH(D) A NEG    Antibody Screen NEG    Sample Expiration 08/16/2018,2359    DAT, IgG NEG    Weak D      POS Performed at [facility], [address] [place], [place] [address]   Protime-INR     Status: Abnormal   Collection Time: 08/14/18  3:15 AM  Result Value Ref Range   Prothrombin Time 23.8 (H) 11.4 - 15.2 seconds   INR 2.2 (H) 0.8 - 1.2    Comment: (NOTE) INR goal varies based on device and disease states. Performed at [facility], [address] [place], [place] [address]   APTT     Status: Abnormal   Collection Time: 08/14/18  3:15 AM  Result Value Ref Range   aPTT 57 (H) 24 - 36 seconds    Comment:        IF BASELINE aPTT IS ELEVATED, SUGGEST PATIENT RISK ASSESSMENT BE USED TO DETERMINE APPROPRIATE ANTICOAGULANT THERAPY. Performed at [facility], [address] [place],  [address]   SARS Coronavirus 2 (CEPHEID - Performed in [facility] hospital lab), Hosp Order     Status: None    Collection Time: 08/14/18  3:15 AM   Specimen: Nasopharyngeal Swab  Result Value Ref Range   SARS Coronavirus 2 NEGATIVE NEGATIVE    Comment: (NOTE) If result is NEGATIVE SARS-CoV-2 target nucleic acids are NOT DETECTED. The  SARS-CoV-2 RNA is generally detectable in upper and lower  respiratory specimens during the acute phase of infection. The lowest  concentration of SARS-CoV-2 viral copies this assay can detect is 250  copies / mL. A negative result does not preclude SARS-CoV-2 infection  and should not be used as the sole basis for treatment or other  patient management decisions.  A negative result may occur with  improper specimen collection / handling, submission of specimen other  than nasopharyngeal swab, presence of viral mutation(s) within the  areas targeted by this assay, and inadequate number of viral copies  (<250 copies / mL). A negative result must be combined with clinical  observations, patient history, and epidemiological information. If result is POSITIVE SARS-CoV-2 target nucleic acids are DETECTED. The SARS-CoV-2 RNA is generally detectable in upper and lower  respiratory specimens dur ing the acute phase of infection.  Positive  results are indicative of active infection with SARS-CoV-2.  Clinical  correlation with patient history and other diagnostic information is  necessary to determine patient infection status.  Positive results do  not rule out bacterial infection or co-infection with other viruses. If result is PRESUMPTIVE POSTIVE SARS-CoV-2 nucleic acids MAY BE PRESENT.   A presumptive positive result was obtained on the submitted specimen  and confirmed on repeat testing.  While 2019 novel coronavirus  (SARS-CoV-2) nucleic acids may be present in the submitted sample  additional confirmatory testing may be necessary for epidemiological  and / or clinical management purposes  to differentiate between  SARS-CoV-2 and other Sarbecovirus currently known  to infect humans.  If clinically indicated additional testing with an alternate test  methodology [phone]) is advised. The SARS-CoV-2 RNA is generally  detectable in upper and lower respiratory sp ecimens during the acute  phase of infection. The expected result is Negative. Fact Sheet for Patients:  StrictlyIdeas.no Fact Sheet for Healthcare Providers: BankingDealers.co.za This test is not yet approved or cleared by the [place] FDA and has been authorized for detection and/or diagnosis of SARS-CoV-2 by FDA under an Emergency Use Authorization (EUA).  This EUA will remain in effect (meaning this test can be used) for the duration of the COVID-19 declaration under Section 564(b)(1) of the Act, 21 U.S.C. section 360bbb-3(b)(1), unless the authorization is terminated or revoked sooner. Performed at [facility], [address] [place], [place] [address]    No results found.  Pending Labs Unresulted Labs (From admission, onward)    Start     Ordered   08/14/18 5366  Basic metabolic panel  Tomorrow morning,   STAT     08/14/18 0409   08/14/18 0500  CBC  Tomorrow morning,   STAT     08/14/18 0409   08/14/18 0500  Protime-INR  Tomorrow morning,   STAT     08/14/18 0409   08/14/18 0500  Protime-INR  Daily,   STAT     08/14/18 0409   08/14/18 0410  HIV antibody (Routine Testing)  Once,   STAT     08/14/18 0409   08/14/18 0410  Hemoglobin and hematocrit, blood  Now then every 6 hours,   STAT     08/14/18 0409   08/14/18 0410  Occult blood card to lab, stool RN will collect  Once,   STAT    Question:  Specimen to be collected by?  Answer:  RN will collect   08/14/18 0409          Vitals/Pain Today's Vitals   08/14/18  9432 08/14/18 0345 08/14/18 0400 08/14/18 0430  BP: (!) 191/87  (!) 188/88 (!) 197/92  Pulse: 69 78 64 65  Resp: 13 16 19 17   Temp:      TempSrc:      SpO2: 95% 96% 98% 96%  Weight:      Height:       PainSc:        Isolation Precautions No active isolations  Medications Medications  pantoprazole (PROTONIX) injection 40 mg (has no administration in time range)  predniSONE (DELTASONE) tablet 5 mg (has no administration in time range)  amLODipine (NORVASC) tablet 10 mg (has no administration in time range)  mometasone-formoterol (DULERA) 100-5 MCG/ACT inhaler 2 puff (has no administration in time range)  buPROPion (WELLBUTRIN XL) 24 hr tablet 300 mg (has no administration in time range)  calcitRIOL (ROCALTROL) capsule 0.25 mcg (has no administration in time range)  citalopram (CELEXA) tablet 20 mg (has no administration in time range)  warfarin (COUMADIN) tablet 2-3 mg (has no administration in time range)  dicyclomine (BENTYL) capsule 10 mg (has no administration in time range)  donepezil (ARICEPT) tablet 10 mg (has no administration in time range)  labetalol (NORMODYNE) tablet 200 mg (has no administration in time range)  magnesium oxide (MAG-OX) tablet 400 mg (has no administration in time range)  mycophenolate (CELLCEPT) capsule 250-500 mg (has no administration in time range)  tacrolimus (PROGRAF) capsule 0.5 mg (has no administration in time range)  sodium chloride flush (NS) 0.9 % injection 3 mL (has no administration in time range)  sodium chloride flush (NS) 0.9 % injection 3 mL (has no administration in time range)  0.9 %  sodium chloride infusion (has no administration in time range)  acetaminophen (TYLENOL) tablet 650 mg (has no administration in time range)    Or  acetaminophen (TYLENOL) suppository 650 mg (has no administration in time range)  polyethylene glycol (MIRALAX / GLYCOLAX) packet 17 g (has no administration in time range)  ondansetron (ZOFRAN) tablet 4 mg (has no administration in time range)    Or  ondansetron (ZOFRAN) injection 4 mg (has no administration in time range)  ipratropium-albuterol (DUONEB) 0.5-2.5 (3) MG/3ML nebulizer solution 3 mL (has no  administration in time range)  hydrALAZINE (APRESOLINE) injection 10 mg (has no administration in time range)  labetalol (NORMODYNE) 5 MG/ML injection (has no administration in time range)  labetalol (NORMODYNE) injection 10 mg (10 mg Intravenous Given 08/14/18 0450)  walks  Mobility  Low fall risk   Focused Assessments n/a   R Recommendations: See Admitting Provider Note  Report given to:   Additional Notes:  n/a

## 2018-08-14 NOTE — Progress Notes (Signed)
[name] npo status and has no fluids infusing. Patient has been hypertensive. Per MD will infuse IF at 34ml/hr for gentle hydration. Will monitor VS frequently and discont if BP becomes significantly elevated.

## 2018-08-15 DIAGNOSIS — K625 Hemorrhage of anus and rectum: Secondary | ICD-10-CM | POA: Diagnosis not present

## 2018-08-15 LAB — HIV ANTIBODY (ROUTINE TESTING W REFLEX): HIV Screen 4th Generation wRfx: NONREACTIVE

## 2018-08-15 LAB — PROTIME-INR
INR: 1.9 — ABNORMAL HIGH (ref 0.8–1.2)
Prothrombin Time: 21.2 seconds — ABNORMAL HIGH (ref 11.4–15.2)

## 2018-08-15 LAB — HEMOGLOBIN AND HEMATOCRIT, BLOOD
HCT: 34.4 % — ABNORMAL LOW (ref 39.0–52.0)
Hemoglobin: 10.8 g/dL — ABNORMAL LOW (ref 13.0–17.0)

## 2018-08-15 MED ORDER — PREDNISONE 5 MG PO TABS: 5.0000 mg | ORAL_TABLET | Freq: Every day | ORAL | 0 refills | Status: DC

## 2018-08-15 NOTE — Care Management Obs Status (Signed)
[place] NOTIFICATION   Patient Details  Name: Eric Gill MRN: [id number] Date of Birth: [birth year]   Medicare Observation Status Notification Given:  Yes    Beverly Sessions, RN 08/15/2018, 1:29 PM

## 2018-08-15 NOTE — Plan of Care (Signed)
Care plan complete. Adequate for discharge.

## 2018-08-15 NOTE — Plan of Care (Signed)

## 2018-08-15 NOTE — Progress Notes (Signed)
Patient discharged to home. D/C instructions discussed with the patient and with his wife, with whom I spoke over the phone. IV removed. No medication changes for the discharge. [name] wheelchaired down to his daughter's car for discharge.

## 2018-08-15 NOTE — Progress Notes (Signed)
Shift summary:  - No bloody BMs overnight. - H+H stable, no longer trending. - Possible discharge to home today.

## 2018-08-15 NOTE — Progress Notes (Signed)
[facility]  ROUNDING NOTE   Subjective:   Endoscopy held as patient's hemoglobin remains stable.   Objective:  Vital signs in last 24 hours:  Temp:  [98.3 F (36.8 C)-98.5 F (36.9 C)] 98.4 F (36.9 C) (07/08 1202) Pulse Rate:  [56-62] 56 (07/08 1202) Resp:  [16-20] 16 (07/08 0426) BP: (143-155)/(72-79) 143/79 (07/08 1202) SpO2:  [96 %-99 %] 97 % (07/08 1202)  Weight change:  Filed Weights   08/13/18 2118  Weight: 57.2 kg    Intake/Output: I/O last 3 completed shifts: In: 575.9 [P.O.:360; I.V.:215.9] Out: 1300 [Urine:1300]   Intake/Output this shift:  Total I/O In: 370 [P.O.:360; I.V.:10] Out: 301 [Urine:300; Stool:1]  Physical Exam: General: NAD,   Head: Normocephalic, atraumatic. Moist oral mucosal membranes  Eyes: Anicteric, PERRL  Neck: Supple, trachea midline  Lungs:  Clear to auscultation  Heart: Regular rate and rhythm  Abdomen:  Soft, nontender, left lower quadrant allograft kidney  Extremities:  no peripheral edema.  Neurologic: Nonfocal, moving all four extremities  Skin: No lesions  Access: none    Basic Metabolic Panel: Recent Labs  Lab 08/13/18 2124 08/14/18 0600  NA 139 142  K 3.9 3.5  CL 111 112*  CO2 19* 22  GLUCOSE 112* 86  BUN 33* 31*  CREATININE 3.02* 3.10*  CALCIUM 9.0 9.2    Liver Function Tests: Recent Labs  Lab 08/13/18 2124  AST 17  ALT 13  ALKPHOS 84  BILITOT 0.3  PROT 6.1*  ALBUMIN 3.8   No results for input(s): LIPASE, AMYLASE in the last 168 hours. No results for input(s): AMMONIA in the last 168 hours.  CBC: Recent Labs  Lab 08/13/18 2124 08/14/18 0600 08/14/18 0936 08/14/18 1246 08/14/18 2001 08/15/18 0413  WBC 5.8 6.1  --   --   --   --   HGB 11.1* 10.8* 10.9* 11.2* 10.7* 10.8*  HCT 36.0* 35.1*  --  36.1* 35.0* 34.4*  MCV 92.1 93.6  --   --   --   --   PLT 147* 140*  --   --   --   --     Cardiac Enzymes: No results for input(s): CKTOTAL, CKMB, CKMBINDEX, TROPONINI in the last  168 hours.  BNP: Invalid input(s): POCBNP  CBG: No results for input(s): GLUCAP in the last 168 hours.  Microbiology: Results for orders placed or performed during the hospital encounter of 08/14/18  SARS Coronavirus 2 (CEPHEID - Performed in [place] hospital lab), Hosp Order     Status: None   Collection Time: 08/14/18  3:15 AM   Specimen: Nasopharyngeal Swab  Result Value Ref Range Status   SARS Coronavirus 2 NEGATIVE NEGATIVE Final    Comment: (NOTE) If result is NEGATIVE SARS-CoV-2 target nucleic acids are NOT DETECTED. The SARS-CoV-2 RNA is generally detectable in upper and lower  respiratory specimens during the acute phase of infection. The lowest  concentration of SARS-CoV-2 viral copies this assay can detect is 250  copies / mL. A negative result does not preclude SARS-CoV-2 infection  and should not be used as the sole basis for treatment or other  patient management decisions.  A negative result may occur with  improper specimen collection / handling, submission of specimen other  than nasopharyngeal swab, presence of viral mutation(s) within the  areas targeted by this assay, and inadequate number of viral copies  (<250 copies / mL). A negative result must be combined with clinical  observations, patient history, and epidemiological information.  If result is POSITIVE SARS-CoV-2 target nucleic acids are DETECTED. The SARS-CoV-2 RNA is generally detectable in upper and lower  respiratory specimens dur ing the acute phase of infection.  Positive  results are indicative of active infection with SARS-CoV-2.  Clinical  correlation with patient history and other diagnostic information is  necessary to determine patient infection status.  Positive results do  not rule out bacterial infection or co-infection with other viruses. If result is PRESUMPTIVE POSTIVE SARS-CoV-2 nucleic acids MAY BE PRESENT.   A presumptive positive result was obtained on the submitted specimen   and confirmed on repeat testing.  While 2019 novel coronavirus  (SARS-CoV-2) nucleic acids may be present in the submitted sample  additional confirmatory testing may be necessary for epidemiological  and / or clinical management purposes  to differentiate between  SARS-CoV-2 and other Sarbecovirus currently known to infect humans.  If clinically indicated additional testing with an alternate test  methodology [phone]) is advised. The SARS-CoV-2 RNA is generally  detectable in upper and lower respiratory sp ecimens during the acute  phase of infection. The expected result is Negative. Fact Sheet for Patients:  StrictlyIdeas.no Fact Sheet for Healthcare Providers: BankingDealers.co.za This test is not yet approved or cleared by the [place] FDA and has been authorized for detection and/or diagnosis of SARS-CoV-2 by FDA under an Emergency Use Authorization (EUA).  This EUA will remain in effect (meaning this test can be used) for the duration of the COVID-19 declaration under Section 564(b)(1) of the Act, 21 U.S.C. section 360bbb-3(b)(1), unless the authorization is terminated or revoked sooner. Performed at [facility], [address] [place], [place] [address]     Coagulation Studies: Recent Labs    08/14/18 0315 08/14/18 0600 08/15/18 0413  LABPROT 23.8* 23.8* 21.2*  INR 2.2* 2.2* 1.9*    Urinalysis: No results for input(s): COLORURINE, LABSPEC, PHURINE, GLUCOSEU, HGBUR, BILIRUBINUR, KETONESUR, PROTEINUR, UROBILINOGEN, NITRITE, LEUKOCYTESUR in the last 72 hours.  Invalid input(s): APPERANCEUR    Imaging: No results found.   Medications:   . sodium chloride     . amLODipine  10 mg Oral Daily  . buPROPion  300 mg Oral Daily  . calcitRIOL  0.25 mcg Oral QHS  . citalopram  20 mg Oral Daily  . dicyclomine  10 mg Oral TID AC  . donepezil  10 mg Oral QHS  . labetalol  200 mg Oral BID  . magnesium  oxide  400 mg Oral BID  . mometasone-formoterol  2 puff Inhalation BID  . mycophenolate  250 mg Oral q morning - 10a   And  . mycophenolate  500 mg Oral QHS  . pantoprazole (PROTONIX) IV  40 mg Intravenous Q12H  . predniSONE  5 mg Oral Q breakfast  . sodium chloride flush  3 mL Intravenous Q12H  . tacrolimus  0.5 mg Oral BID  . warfarin  3 mg Oral Once per day on Mon Wed Fri   And  . warfarin  2 mg Oral Once per day on Sun Tue Thu Sat  . Warfarin - Physician Dosing Inpatient   Does not apply q1800   sodium chloride, acetaminophen **OR** acetaminophen, hydrALAZINE, ipratropium-albuterol, ondansetron **OR** ondansetron (ZOFRAN) IV, polyethylene glycol, sodium chloride flush  Assessment/ Plan:   Mr. EMAAD NANNA is a 68 y.o. 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 Acute renal failure on Chronic kidney disease stage IV T Status post renal transplantation in 2001. His baseline creatinine is 2.8, GFR of 23 on 03/27/18. Creatinine close to baseline.  Followed by Dr. Alger Simons, [facility].  - Continue mycophenolate, tacrolimus and prednisone.  - May restart furosemide on discharge  2. 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  - appreciate GI input. - Do not stop anticoagulation due to Anti-phospholipid antibody syndrome.   3. Hypertension. Blood pressure at goal.   - continue amlodipine, labetalol - restart furosemide.   4. Secondary hyperparathyroidism.   - Continue calcitriol    LOS: 1 Chisa Kushner 7/8/202012:31 PM

## 2018-08-15 NOTE — Discharge Summary (Signed)
Eric Gill at [place] NAME: Eric Gill    MR#:  [id number]  DATE OF BIRTH:  [birth year]  DATE OF ADMISSION:  08/14/2018 ADMITTING PHYSICIAN: Christel Mormon, MD  DATE OF DISCHARGE: 08/15/2018  PRIMARY CARE PHYSICIAN: Idelle Crouch, MD    ADMISSION DIAGNOSIS:  Rectal bleeding [K62.5] Antiphospholipid antibody syndrome ([place]) [D68.61] Warfarin anticoagulation [Z79.01]  DISCHARGE DIAGNOSIS:  Lower GI bleed  SECONDARY DIAGNOSIS:   Past Medical History:  Diagnosis Date  . Anemia   . Antiphospholipid antibody syndrome ([place])   . Arthritis   . DVT (deep venous thrombosis) ([place])   . Essential hypertension   . Gastric ulcer   . GERD (gastroesophageal reflux disease)   . History of CMV   . Hyperlipidemia   . Hyperparathyroidism ([place])   . Pedal edema   . Peripheral vascular disease ([place])   . Rosacea   . Status post kidney transplant 2001  . Stroke ([place])   . TIA (transient ischemic attack)   . Vascular dementia without behavioral disturbance [facility])     HOSPITAL COURSE:  [name] a68 y.o.malewith a known history of antiphospholipid antibody syndrome, multiple strokes and DVTs when INR became subtherapeutic on Coumadin. He presented to the emergency room with his wife at bedside for evaluation of bright red blood per rectum.   1.GI bleed appears lower ?diverticular -On anticoagulation therapy with Coumadin for history of stroke and DVT with antiphospholipid antibody -We will continue Coumadin at this time as hemoglobin and hematocrit are stable given patient's extensive history of multiple strokes and DVTs when Coumadin becomes subtherapeutic. - consult Dr. Allen Norris -- no active bleeding. Hemoglobin stable. Advanced to soft diet -pt has had no bloody stool - No G.I. evaluation plan for now. -hgb 10.8 -PPI therapy in  2.antiphospholipid antibody syndrome -continue Coumadin given patient's history of multiple  strokes and DVTs - discussed with Dr. [name] who recommends continue Coumadin.  3. Hypertension -Labetalol and Norvasc continued -treat persistent hypertension with IV hydralazine  4. History of renal transplant -CellCept, prednisone and Prograf continued -BUN is 33 with creatinine 3.02.  -Dr. Juleen China 's input noted  5. Dementia -Continue Aricept  Overall at basleine d/c home D/w wife on the phone and agreeable with plan   CONSULTS OBTAINED:  Treatment Team:  Lucilla Lame, MD Lloyd Huger, MD  DRUG ALLERGIES:   Allergies  Allergen Reactions  . Losartan Cough  . Statins Nausea And Vomiting and Other (See Comments)    Reaction:  Muscle pain     DISCHARGE MEDICATIONS:   Allergies as of 08/15/2018      Reactions   Losartan Cough   Statins Nausea And Vomiting, Other (See Comments)   Reaction:  Muscle pain       Medication List    TAKE these medications   amLODipine 10 MG tablet Commonly known as: NORVASC Take 10 mg by mouth daily.   budesonide-formoterol 80-4.5 MCG/ACT inhaler Commonly known as: SYMBICORT Inhale 2 puffs into the lungs 2 (two) times daily.   buPROPion 150 MG 24 hr tablet Commonly known as: WELLBUTRIN XL Take 300 mg by mouth daily.   calcitRIOL 0.25 MCG capsule Commonly known as: ROCALTROL Take 0.25 mcg by mouth at bedtime.   citalopram 20 MG tablet Commonly known as: CELEXA Take 20 mg by mouth daily.   Coumadin 1 MG tablet Generic drug: warfarin Take 2-3 mg by mouth as directed. 3 mg Monday, Weds, Friday. 2 mg Tues, Thursday, Sat  and Sun   dicyclomine 10 MG capsule Commonly known as: BENTYL Take 10 mg by mouth 3 (three) times daily before meals.   diphenoxylate-atropine 2.5-0.025 MG tablet Commonly known as: LOMOTIL Take 1 tablet by mouth 4 (four) times daily as needed for diarrhea or loose stools.   donepezil 10 MG tablet Commonly known as: ARICEPT Take 10 mg by mouth at bedtime.   fluorouracil 5 %  cream Commonly known as: EFUDEX   furosemide 40 MG tablet Commonly known as: LASIX Take 40 mg by mouth daily as needed.   labetalol 200 MG tablet Commonly known as: NORMODYNE Take 200 mg by mouth 2 (two) times daily.   magnesium oxide 400 MG tablet Commonly known as: MAG-OX TAKE ONE TABLET BY MOUTH TWICE DAILY   Multi-Vitamins Tabs Take 1 tablet by mouth daily.   mycophenolate 250 MG capsule Commonly known as: CELLCEPT Take 250-500 mg by mouth 2 (two) times daily. 250 mg in the am and 500 mg at bedtime   pantoprazole 40 MG tablet Commonly known as: PROTONIX Take 1 tablet (40 mg total) by mouth 2 (two) times daily before a meal. What changed: when to take this   predniSONE 5 MG tablet Commonly known as: Deltasone Take 1 tablet (5 mg total) by mouth daily with breakfast. What changed:   medication strength  how much to take   Proventil HFA 108 (90 Base) MCG/ACT inhaler Generic drug: albuterol Inhale into the lungs.   tacrolimus 0.5 MG capsule Commonly known as: PROGRAF Take 1 capsule by mouth 2 (two) times a day.       If you experience worsening of your admission symptoms, develop shortness of breath, life threatening emergency, suicidal or homicidal thoughts you must seek medical attention immediately by calling 911 or calling your MD immediately  if symptoms less severe.  You Must read complete instructions/literature along with all the possible adverse reactions/side effects for all the Medicines you take and that have been prescribed to you. Take any new Medicines after you have completely understood and accept all the possible adverse reactions/side effects.   Please note  You were cared for by a hospitalist during your hospital stay. If you have any questions about your discharge medications or the care you received while you were in the hospital after you are discharged, you can call the unit and asked to speak with the hospitalist on call if the hospitalist  that took care of you is not available. Once you are discharged, your primary care physician will handle any further medical issues. Please note that NO REFILLS for any discharge medications will be authorized once you are discharged, as it is imperative that you return to your primary care physician (or establish a relationship with a primary care physician if you do not have one) for your aftercare needs so that they can reassess your need for medications and monitor your lab values. Today   SUBJECTIVE    No new complaints. VITAL SIGNS:  Blood pressure (!) 148/72, pulse 60, temperature 98.5 F (36.9 C), temperature source Oral, resp. rate 16, height 5\' 3"  (1.6 m), weight 57.2 kg, SpO2 96 %.  I/O:    Intake/Output Summary (Last 24 hours) at 08/15/2018 1155 Last data filed at 08/15/2018 0929 Gross per 24 hour  Intake 945.9 ml  Output 1501 ml  Net -555.1 ml    PHYSICAL EXAMINATION:  GENERAL:  68 y.o.-year-old patient lying in the bed with no acute distress.  EYES: Pupils equal, round, reactive  to light and accommodation. No scleral icterus. Extraocular muscles intact.  HEENT: Head atraumatic, normocephalic. Oropharynx and nasopharynx clear.  NECK:  Supple, no jugular venous distention. No thyroid enlargement, no tenderness.  LUNGS: Normal breath sounds bilaterally, no wheezing, rales,rhonchi or crepitation. No use of accessory muscles of respiration.  CARDIOVASCULAR: S1, S2 normal. No murmurs, rubs, or gallops.  ABDOMEN: Soft, non-tender, non-distended. Bowel sounds present. No organomegaly or mass.  EXTREMITIES: No pedal edema, cyanosis, or clubbing.  NEUROLOGIC: Cranial nerves II through XII are intact. Muscle strength 5/5 in all extremities. Sensation intact. Gait not checked.  PSYCHIATRIC: The patient is alert and oriented x 3.  SKIN: No obvious rash, lesion, or ulcer.   DATA REVIEW:   CBC  Recent Labs  Lab 08/14/18 0600  08/15/18 0413  WBC 6.1  --   --   HGB 10.8*   < >  10.8*  HCT 35.1*   < > 34.4*  PLT 140*  --   --    < > = values in this interval not displayed.    Chemistries  Recent Labs  Lab 08/13/18 2124 08/14/18 0600  NA 139 142  K 3.9 3.5  CL 111 112*  CO2 19* 22  GLUCOSE 112* 86  BUN 33* 31*  CREATININE 3.02* 3.10*  CALCIUM 9.0 9.2  AST 17  --   ALT 13  --   ALKPHOS 84  --   BILITOT 0.3  --     Microbiology Results   Recent Results (from the past 240 hour(s))  SARS Coronavirus 2 (CEPHEID - Performed in [place] hospital lab), Hosp Order     Status: None   Collection Time: 08/14/18  3:15 AM   Specimen: Nasopharyngeal Swab  Result Value Ref Range Status   SARS Coronavirus 2 NEGATIVE NEGATIVE Final    Comment: (NOTE) If result is NEGATIVE SARS-CoV-2 target nucleic acids are NOT DETECTED. The SARS-CoV-2 RNA is generally detectable in upper and lower  respiratory specimens during the acute phase of infection. The lowest  concentration of SARS-CoV-2 viral copies this assay can detect is 250  copies / mL. A negative result does not preclude SARS-CoV-2 infection  and should not be used as the sole basis for treatment or other  patient management decisions.  A negative result may occur with  improper specimen collection / handling, submission of specimen other  than nasopharyngeal swab, presence of viral mutation(s) within the  areas targeted by this assay, and inadequate number of viral copies  (<250 copies / mL). A negative result must be combined with clinical  observations, patient history, and epidemiological information. If result is POSITIVE SARS-CoV-2 target nucleic acids are DETECTED. The SARS-CoV-2 RNA is generally detectable in upper and lower  respiratory specimens dur ing the acute phase of infection.  Positive  results are indicative of active infection with SARS-CoV-2.  Clinical  correlation with patient history and other diagnostic information is  necessary to determine patient infection status.  Positive  results do  not rule out bacterial infection or co-infection with other viruses. If result is PRESUMPTIVE POSTIVE SARS-CoV-2 nucleic acids MAY BE PRESENT.   A presumptive positive result was obtained on the submitted specimen  and confirmed on repeat testing.  While 2019 novel coronavirus  (SARS-CoV-2) nucleic acids may be present in the submitted sample  additional confirmatory testing may be necessary for epidemiological  and / or clinical management purposes  to differentiate between  SARS-CoV-2 and other Sarbecovirus currently known to infect humans.  If clinically indicated additional testing with an alternate test  methodology [id number]) is advised. The SARS-CoV-2 RNA is generally  detectable in upper and lower respiratory sp ecimens during the acute  phase of infection. The expected result is Negative. Fact Sheet for Patients:  StrictlyIdeas.no Fact Sheet for Healthcare Providers: BankingDealers.co.za This test is not yet approved or cleared by the [place] FDA and has been authorized for detection and/or diagnosis of SARS-CoV-2 by FDA under an Emergency Use Authorization (EUA).  This EUA will remain in effect (meaning this test can be used) for the duration of the COVID-19 declaration under Section 564(b)(1) of the Act, 21 U.S.C. section 360bbb-3(b)(1), unless the authorization is terminated or revoked sooner. Performed at [facility], [address] [place], [place] [address]     RADIOLOGY:  No results found.   CODE STATUS:     Code Status Orders  (From admission, onward)         Start     Ordered   08/14/18 0410  Full code  Continuous     08/14/18 0409        Code Status History    Date Active Date Inactive Code Status Order ID Comments User Context   02/23/2015 2004 03/08/2015 1850 Full Code [id number]  Vaughan Basta, MD Inpatient   Advance Care Planning Activity    Advance Directive  Documentation     Most Recent Value  Type of Advance Directive  Healthcare Power of Attorney, Living will  Pre-existing out of facility DNR order (yellow form or pink MOST form)  -  "MOST" Form in Place?  -      TOTAL TIME TAKING CARE OF THIS PATIENT: 40  minutes.    Fritzi Mandes M.D on 08/15/2018 at 11:55 AM  Between 7am to 6pm - Pager - [phone] After 6pm go to [email or web address] - password [email or web address] [place] [facility]  Office  [phone]  CC: Primary care physician; Idelle Crouch, MD

## 2018-08-15 NOTE — Care Management CC44 (Signed)
Condition Code 44 Documentation Completed  Patient Details  Name: Eric Gill MRN: [id number] Date of Birth: 12-20-1950   Condition Code 44 given:  Yes Patient signature on Condition Code 44 notice:  Yes Documentation of 2 MD's agreement:  Yes Code 44 added to claim:  Yes    Beverly Sessions, RN 08/15/2018, 1:29 PM

## 2018-10-15 ENCOUNTER — Encounter: Payer: Self-pay | Admitting: Emergency Medicine

## 2018-10-15 ENCOUNTER — Other Ambulatory Visit: Payer: Self-pay

## 2018-10-15 DIAGNOSIS — Z7901 Long term (current) use of anticoagulants: Secondary | ICD-10-CM

## 2018-10-15 DIAGNOSIS — I161 Hypertensive emergency: Secondary | ICD-10-CM | POA: Diagnosis present

## 2018-10-15 DIAGNOSIS — A419 Sepsis, unspecified organism: Secondary | ICD-10-CM | POA: Diagnosis not present

## 2018-10-15 DIAGNOSIS — Y92019 Unspecified place in single-family (private) house as the place of occurrence of the external cause: Secondary | ICD-10-CM

## 2018-10-15 DIAGNOSIS — J441 Chronic obstructive pulmonary disease with (acute) exacerbation: Secondary | ICD-10-CM | POA: Diagnosis not present

## 2018-10-15 DIAGNOSIS — Z809 Family history of malignant neoplasm, unspecified: Secondary | ICD-10-CM

## 2018-10-15 DIAGNOSIS — Z8249 Family history of ischemic heart disease and other diseases of the circulatory system: Secondary | ICD-10-CM

## 2018-10-15 DIAGNOSIS — N179 Acute kidney failure, unspecified: Secondary | ICD-10-CM | POA: Diagnosis present

## 2018-10-15 DIAGNOSIS — N390 Urinary tract infection, site not specified: Secondary | ICD-10-CM | POA: Diagnosis not present

## 2018-10-15 DIAGNOSIS — Z79899 Other long term (current) drug therapy: Secondary | ICD-10-CM

## 2018-10-15 DIAGNOSIS — D6861 Antiphospholipid syndrome: Secondary | ICD-10-CM | POA: Diagnosis present

## 2018-10-15 DIAGNOSIS — N184 Chronic kidney disease, stage 4 (severe): Secondary | ICD-10-CM | POA: Diagnosis present

## 2018-10-15 DIAGNOSIS — E872 Acidosis: Secondary | ICD-10-CM | POA: Diagnosis not present

## 2018-10-15 DIAGNOSIS — W228XXA Striking against or struck by other objects, initial encounter: Secondary | ICD-10-CM | POA: Diagnosis present

## 2018-10-15 DIAGNOSIS — I130 Hypertensive heart and chronic kidney disease with heart failure and stage 1 through stage 4 chronic kidney disease, or unspecified chronic kidney disease: Secondary | ICD-10-CM | POA: Diagnosis present

## 2018-10-15 DIAGNOSIS — I739 Peripheral vascular disease, unspecified: Secondary | ICD-10-CM | POA: Diagnosis present

## 2018-10-15 DIAGNOSIS — T8613 Kidney transplant infection: Secondary | ICD-10-CM | POA: Diagnosis present

## 2018-10-15 DIAGNOSIS — T8619 Other complication of kidney transplant: Secondary | ICD-10-CM | POA: Diagnosis not present

## 2018-10-15 DIAGNOSIS — E785 Hyperlipidemia, unspecified: Secondary | ICD-10-CM | POA: Diagnosis present

## 2018-10-15 DIAGNOSIS — G9341 Metabolic encephalopathy: Secondary | ICD-10-CM | POA: Diagnosis not present

## 2018-10-15 DIAGNOSIS — Z7951 Long term (current) use of inhaled steroids: Secondary | ICD-10-CM

## 2018-10-15 DIAGNOSIS — E876 Hypokalemia: Secondary | ICD-10-CM | POA: Diagnosis not present

## 2018-10-15 DIAGNOSIS — Z8711 Personal history of peptic ulcer disease: Secondary | ICD-10-CM

## 2018-10-15 DIAGNOSIS — D631 Anemia in chronic kidney disease: Secondary | ICD-10-CM | POA: Diagnosis present

## 2018-10-15 DIAGNOSIS — Z20828 Contact with and (suspected) exposure to other viral communicable diseases: Secondary | ICD-10-CM | POA: Diagnosis present

## 2018-10-15 DIAGNOSIS — Z87891 Personal history of nicotine dependence: Secondary | ICD-10-CM

## 2018-10-15 DIAGNOSIS — F0150 Vascular dementia without behavioral disturbance: Secondary | ICD-10-CM | POA: Diagnosis present

## 2018-10-15 DIAGNOSIS — I5033 Acute on chronic diastolic (congestive) heart failure: Secondary | ICD-10-CM | POA: Diagnosis present

## 2018-10-15 DIAGNOSIS — K219 Gastro-esophageal reflux disease without esophagitis: Secondary | ICD-10-CM | POA: Diagnosis present

## 2018-10-15 DIAGNOSIS — Z8673 Personal history of transient ischemic attack (TIA), and cerebral infarction without residual deficits: Secondary | ICD-10-CM

## 2018-10-15 DIAGNOSIS — J9601 Acute respiratory failure with hypoxia: Secondary | ICD-10-CM | POA: Diagnosis not present

## 2018-10-15 DIAGNOSIS — M1990 Unspecified osteoarthritis, unspecified site: Secondary | ICD-10-CM | POA: Diagnosis present

## 2018-10-15 DIAGNOSIS — R6520 Severe sepsis without septic shock: Secondary | ICD-10-CM | POA: Diagnosis not present

## 2018-10-15 DIAGNOSIS — Z86718 Personal history of other venous thrombosis and embolism: Secondary | ICD-10-CM

## 2018-10-15 DIAGNOSIS — B961 Klebsiella pneumoniae [K. pneumoniae] as the cause of diseases classified elsewhere: Secondary | ICD-10-CM | POA: Diagnosis present

## 2018-10-15 DIAGNOSIS — N2581 Secondary hyperparathyroidism of renal origin: Secondary | ICD-10-CM | POA: Diagnosis present

## 2018-10-15 DIAGNOSIS — S93514A Sprain of interphalangeal joint of right lesser toe(s), initial encounter: Secondary | ICD-10-CM | POA: Diagnosis present

## 2018-10-15 DIAGNOSIS — Z888 Allergy status to other drugs, medicaments and biological substances status: Secondary | ICD-10-CM

## 2018-10-15 DIAGNOSIS — Z841 Family history of disorders of kidney and ureter: Secondary | ICD-10-CM

## 2018-10-15 LAB — CBC
HCT: 36.8 % — ABNORMAL LOW (ref 39.0–52.0)
Hemoglobin: 11.5 g/dL — ABNORMAL LOW (ref 13.0–17.0)
MCH: 29 pg (ref 26.0–34.0)
MCHC: 31.3 g/dL (ref 30.0–36.0)
MCV: 92.9 fL (ref 80.0–100.0)
Platelets: 146 10*3/uL — ABNORMAL LOW (ref 150–400)
RBC: 3.96 MIL/uL — ABNORMAL LOW (ref 4.22–5.81)
RDW: 13.9 % (ref 11.5–15.5)
WBC: 8.7 10*3/uL (ref 4.0–10.5)
nRBC: 0 % (ref 0.0–0.2)

## 2018-10-15 NOTE — ED Triage Notes (Signed)
Pt presents to ED with urinary frequency and weakness today. Pt denies pain with urination. Hx of the same.

## 2018-10-16 ENCOUNTER — Other Ambulatory Visit: Payer: Self-pay

## 2018-10-16 ENCOUNTER — Emergency Department: Payer: Medicare Other

## 2018-10-16 ENCOUNTER — Inpatient Hospital Stay
Admission: EM | Admit: 2018-10-16 | Discharge: 2018-10-30 | DRG: 698 | Disposition: A | Discharge status: Home Health | Payer: Medicare Other | Attending: Internal Medicine | Admitting: Internal Medicine

## 2018-10-16 DIAGNOSIS — E872 Acidosis: Secondary | ICD-10-CM | POA: Diagnosis not present

## 2018-10-16 DIAGNOSIS — G9340 Encephalopathy, unspecified: Secondary | ICD-10-CM | POA: Diagnosis not present

## 2018-10-16 DIAGNOSIS — R0600 Dyspnea, unspecified: Secondary | ICD-10-CM

## 2018-10-16 DIAGNOSIS — E785 Hyperlipidemia, unspecified: Secondary | ICD-10-CM | POA: Diagnosis present

## 2018-10-16 DIAGNOSIS — I739 Peripheral vascular disease, unspecified: Secondary | ICD-10-CM | POA: Diagnosis present

## 2018-10-16 DIAGNOSIS — N390 Urinary tract infection, site not specified: Secondary | ICD-10-CM | POA: Diagnosis present

## 2018-10-16 DIAGNOSIS — T8619 Other complication of kidney transplant: Secondary | ICD-10-CM | POA: Diagnosis present

## 2018-10-16 DIAGNOSIS — Z8673 Personal history of transient ischemic attack (TIA), and cerebral infarction without residual deficits: Secondary | ICD-10-CM | POA: Diagnosis not present

## 2018-10-16 DIAGNOSIS — Y92019 Unspecified place in single-family (private) house as the place of occurrence of the external cause: Secondary | ICD-10-CM | POA: Diagnosis not present

## 2018-10-16 DIAGNOSIS — I129 Hypertensive chronic kidney disease with stage 1 through stage 4 chronic kidney disease, or unspecified chronic kidney disease: Secondary | ICD-10-CM | POA: Diagnosis not present

## 2018-10-16 DIAGNOSIS — D638 Anemia in other chronic diseases classified elsewhere: Secondary | ICD-10-CM | POA: Diagnosis not present

## 2018-10-16 DIAGNOSIS — N179 Acute kidney failure, unspecified: Secondary | ICD-10-CM | POA: Diagnosis present

## 2018-10-16 DIAGNOSIS — N189 Chronic kidney disease, unspecified: Secondary | ICD-10-CM | POA: Diagnosis present

## 2018-10-16 DIAGNOSIS — R4182 Altered mental status, unspecified: Secondary | ICD-10-CM | POA: Diagnosis not present

## 2018-10-16 DIAGNOSIS — Z20828 Contact with and (suspected) exposure to other viral communicable diseases: Secondary | ICD-10-CM | POA: Diagnosis present

## 2018-10-16 DIAGNOSIS — G9341 Metabolic encephalopathy: Secondary | ICD-10-CM | POA: Diagnosis not present

## 2018-10-16 DIAGNOSIS — N183 Chronic kidney disease, stage 3 (moderate): Secondary | ICD-10-CM | POA: Diagnosis not present

## 2018-10-16 DIAGNOSIS — R7881 Bacteremia: Secondary | ICD-10-CM | POA: Diagnosis not present

## 2018-10-16 DIAGNOSIS — Z7901 Long term (current) use of anticoagulants: Secondary | ICD-10-CM | POA: Diagnosis not present

## 2018-10-16 DIAGNOSIS — Z86718 Personal history of other venous thrombosis and embolism: Secondary | ICD-10-CM | POA: Diagnosis not present

## 2018-10-16 DIAGNOSIS — I130 Hypertensive heart and chronic kidney disease with heart failure and stage 1 through stage 4 chronic kidney disease, or unspecified chronic kidney disease: Secondary | ICD-10-CM | POA: Diagnosis present

## 2018-10-16 DIAGNOSIS — I161 Hypertensive emergency: Secondary | ICD-10-CM | POA: Diagnosis present

## 2018-10-16 DIAGNOSIS — R0602 Shortness of breath: Secondary | ICD-10-CM | POA: Diagnosis not present

## 2018-10-16 DIAGNOSIS — Z87442 Personal history of urinary calculi: Secondary | ICD-10-CM | POA: Diagnosis not present

## 2018-10-16 DIAGNOSIS — Z940 Kidney transplant status: Secondary | ICD-10-CM | POA: Diagnosis not present

## 2018-10-16 DIAGNOSIS — T8613 Kidney transplant infection: Secondary | ICD-10-CM | POA: Diagnosis present

## 2018-10-16 DIAGNOSIS — Z79899 Other long term (current) drug therapy: Secondary | ICD-10-CM | POA: Diagnosis not present

## 2018-10-16 DIAGNOSIS — A419 Sepsis, unspecified organism: Secondary | ICD-10-CM | POA: Diagnosis not present

## 2018-10-16 DIAGNOSIS — S93514A Sprain of interphalangeal joint of right lesser toe(s), initial encounter: Secondary | ICD-10-CM

## 2018-10-16 DIAGNOSIS — B9689 Other specified bacterial agents as the cause of diseases classified elsewhere: Secondary | ICD-10-CM | POA: Diagnosis not present

## 2018-10-16 DIAGNOSIS — F0150 Vascular dementia without behavioral disturbance: Secondary | ICD-10-CM | POA: Diagnosis present

## 2018-10-16 DIAGNOSIS — M1990 Unspecified osteoarthritis, unspecified site: Secondary | ICD-10-CM | POA: Diagnosis present

## 2018-10-16 DIAGNOSIS — N12 Tubulo-interstitial nephritis, not specified as acute or chronic: Secondary | ICD-10-CM | POA: Diagnosis not present

## 2018-10-16 DIAGNOSIS — N2581 Secondary hyperparathyroidism of renal origin: Secondary | ICD-10-CM | POA: Diagnosis present

## 2018-10-16 DIAGNOSIS — Z87440 Personal history of urinary (tract) infections: Secondary | ICD-10-CM | POA: Diagnosis not present

## 2018-10-16 DIAGNOSIS — J811 Chronic pulmonary edema: Secondary | ICD-10-CM | POA: Diagnosis not present

## 2018-10-16 DIAGNOSIS — R0902 Hypoxemia: Secondary | ICD-10-CM

## 2018-10-16 DIAGNOSIS — D6861 Antiphospholipid syndrome: Secondary | ICD-10-CM | POA: Diagnosis present

## 2018-10-16 DIAGNOSIS — K219 Gastro-esophageal reflux disease without esophagitis: Secondary | ICD-10-CM | POA: Diagnosis present

## 2018-10-16 DIAGNOSIS — Z7952 Long term (current) use of systemic steroids: Secondary | ICD-10-CM | POA: Diagnosis not present

## 2018-10-16 DIAGNOSIS — J9601 Acute respiratory failure with hypoxia: Secondary | ICD-10-CM | POA: Diagnosis not present

## 2018-10-16 DIAGNOSIS — I340 Nonrheumatic mitral (valve) insufficiency: Secondary | ICD-10-CM | POA: Diagnosis not present

## 2018-10-16 DIAGNOSIS — N184 Chronic kidney disease, stage 4 (severe): Secondary | ICD-10-CM | POA: Diagnosis present

## 2018-10-16 DIAGNOSIS — W228XXA Striking against or struck by other objects, initial encounter: Secondary | ICD-10-CM | POA: Diagnosis present

## 2018-10-16 DIAGNOSIS — R410 Disorientation, unspecified: Secondary | ICD-10-CM | POA: Diagnosis not present

## 2018-10-16 DIAGNOSIS — J441 Chronic obstructive pulmonary disease with (acute) exacerbation: Secondary | ICD-10-CM | POA: Diagnosis not present

## 2018-10-16 DIAGNOSIS — I5033 Acute on chronic diastolic (congestive) heart failure: Secondary | ICD-10-CM | POA: Diagnosis present

## 2018-10-16 DIAGNOSIS — I10 Essential (primary) hypertension: Secondary | ICD-10-CM | POA: Diagnosis not present

## 2018-10-16 DIAGNOSIS — J449 Chronic obstructive pulmonary disease, unspecified: Secondary | ICD-10-CM | POA: Diagnosis not present

## 2018-10-16 DIAGNOSIS — H5347 Heteronymous bilateral field defects: Secondary | ICD-10-CM | POA: Diagnosis not present

## 2018-10-16 DIAGNOSIS — R6520 Severe sepsis without septic shock: Secondary | ICD-10-CM | POA: Diagnosis not present

## 2018-10-16 DIAGNOSIS — B961 Klebsiella pneumoniae [K. pneumoniae] as the cause of diseases classified elsewhere: Secondary | ICD-10-CM | POA: Diagnosis not present

## 2018-10-16 LAB — URINALYSIS, COMPLETE (UACMP) WITH MICROSCOPIC
Bilirubin Urine: NEGATIVE
Glucose, UA: NEGATIVE mg/dL
Ketones, ur: NEGATIVE mg/dL
Nitrite: NEGATIVE
Protein, ur: 30 mg/dL — AB
RBC / HPF: >50 RBC/hpf — ABNORMAL HIGH (ref 0–5)
Specific Gravity, Urine: 1.011 (ref 1.005–1.030)
WBC, UA: >50 WBC/hpf — ABNORMAL HIGH (ref 0–5)
pH: 5 (ref 5.0–8.0)

## 2018-10-16 LAB — BLOOD CULTURE ID PANEL (REFLEXED)
Acinetobacter baumannii: NOT DETECTED
Candida albicans: NOT DETECTED
Candida glabrata: NOT DETECTED
Candida krusei: NOT DETECTED
Candida parapsilosis: NOT DETECTED
Candida tropicalis: NOT DETECTED
Carbapenem resistance: NOT DETECTED
Enterobacter cloacae complex: NOT DETECTED
Enterobacteriaceae species: DETECTED — AB
Enterococcus species: NOT DETECTED
Escherichia coli: NOT DETECTED
Haemophilus influenzae: NOT DETECTED
Klebsiella oxytoca: DETECTED — AB
Klebsiella pneumoniae: NOT DETECTED
Listeria monocytogenes: NOT DETECTED
Neisseria meningitidis: NOT DETECTED
Proteus species: NOT DETECTED
Pseudomonas aeruginosa: NOT DETECTED
Serratia marcescens: NOT DETECTED
Staphylococcus aureus (BCID): NOT DETECTED
Staphylococcus species: NOT DETECTED
Streptococcus agalactiae: NOT DETECTED
Streptococcus pneumoniae: NOT DETECTED
Streptococcus pyogenes: NOT DETECTED
Streptococcus species: NOT DETECTED

## 2018-10-16 LAB — BASIC METABOLIC PANEL
Anion gap: 8 (ref 5–15)
BUN: 33 mg/dL — ABNORMAL HIGH (ref 8–23)
CO2: 21 mmol/L — ABNORMAL LOW (ref 22–32)
Calcium: 9.4 mg/dL (ref 8.9–10.3)
Chloride: 112 mmol/L — ABNORMAL HIGH (ref 98–111)
Creatinine, Ser: 3.51 mg/dL — ABNORMAL HIGH (ref 0.61–1.24)
GFR calc Af Amer: 19 mL/min — ABNORMAL LOW (ref >60)
GFR calc non Af Amer: 17 mL/min — ABNORMAL LOW (ref >60)
Glucose, Bld: 107 mg/dL — ABNORMAL HIGH (ref 70–99)
Potassium: 3.9 mmol/L (ref 3.5–5.1)
Sodium: 141 mmol/L (ref 135–145)

## 2018-10-16 LAB — PROTIME-INR
INR: 2.4 — ABNORMAL HIGH (ref 0.8–1.2)
Prothrombin Time: 25.8 seconds — ABNORMAL HIGH (ref 11.4–15.2)

## 2018-10-16 LAB — LACTIC ACID, PLASMA: Lactic Acid, Venous: 0.9 mmol/L (ref 0.5–1.9)

## 2018-10-16 LAB — SARS CORONAVIRUS 2 BY RT PCR (HOSPITAL ORDER, PERFORMED IN ~~LOC~~ HOSPITAL LAB): SARS Coronavirus 2: NEGATIVE

## 2018-10-16 LAB — TROPONIN I (HIGH SENSITIVITY): Troponin I (High Sensitivity): 22 ng/L — ABNORMAL HIGH (ref <18)

## 2018-10-16 MED ORDER — CALCITRIOL 0.25 MCG PO CAPS
0.2500 ug | ORAL_CAPSULE | Freq: Every day | ORAL | Status: DC
  Administered 2018-10-16 – 2018-10-29 (×14): 0.25 ug via ORAL
  Filled 2018-10-16 (×15): qty 1

## 2018-10-16 MED ORDER — BUPIVACAINE HCL (PF) 0.5 % IJ SOLN
10.0000 mL | Freq: Once | INTRAMUSCULAR | Status: AC
  Administered 2018-10-16: 03:00:00 10 mL
  Filled 2018-10-16: qty 30

## 2018-10-16 MED ORDER — DICYCLOMINE HCL 10 MG PO CAPS
10.0000 mg | ORAL_CAPSULE | Freq: Three times a day (TID) | ORAL | Status: DC
  Administered 2018-10-16 – 2018-10-30 (×40): 10 mg via ORAL
  Filled 2018-10-16 (×45): qty 1

## 2018-10-16 MED ORDER — MAGNESIUM OXIDE 400 (241.3 MG) MG PO TABS
400.0000 mg | ORAL_TABLET | Freq: Two times a day (BID) | ORAL | Status: DC
  Administered 2018-10-16 – 2018-10-30 (×29): 400 mg via ORAL
  Filled 2018-10-16 (×29): qty 1

## 2018-10-16 MED ORDER — ONDANSETRON HCL 4 MG PO TABS: 4.0000 mg | ORAL_TABLET | Freq: Four times a day (QID) | ORAL | Status: DC | PRN

## 2018-10-16 MED ORDER — BUPIVACAINE HCL 0.5 % IJ SOLN
5.0000 mL | Freq: Once | INTRAMUSCULAR | Status: DC
  Filled 2018-10-16 (×2): qty 5

## 2018-10-16 MED ORDER — MYCOPHENOLATE MOFETIL 250 MG PO CAPS
500.0000 mg | ORAL_CAPSULE | Freq: Every day | ORAL | Status: DC
  Administered 2018-10-16 – 2018-10-29 (×14): 500 mg via ORAL
  Filled 2018-10-16 (×16): qty 2

## 2018-10-16 MED ORDER — ALBUTEROL SULFATE (2.5 MG/3ML) 0.083% IN NEBU
2.5000 mg | INHALATION_SOLUTION | RESPIRATORY_TRACT | Status: DC | PRN
  Administered 2018-10-20 – 2018-10-21 (×7): 2.5 mg via RESPIRATORY_TRACT
  Filled 2018-10-16 (×7): qty 3

## 2018-10-16 MED ORDER — WARFARIN SODIUM 2 MG PO TABS
2.0000 mg | ORAL_TABLET | Freq: Once | ORAL | Status: AC
  Administered 2018-10-16: 2 mg via ORAL
  Filled 2018-10-16: qty 1

## 2018-10-16 MED ORDER — TACROLIMUS 0.5 MG PO CAPS
0.5000 mg | ORAL_CAPSULE | Freq: Two times a day (BID) | ORAL | Status: DC
  Administered 2018-10-16 – 2018-10-30 (×29): 0.5 mg via ORAL
  Filled 2018-10-16 (×31): qty 1

## 2018-10-16 MED ORDER — MYCOPHENOLATE MOFETIL 250 MG PO CAPS
250.0000 mg | ORAL_CAPSULE | Freq: Every morning | ORAL | Status: DC
  Administered 2018-10-16 – 2018-10-30 (×15): 250 mg via ORAL
  Filled 2018-10-16 (×15): qty 1

## 2018-10-16 MED ORDER — ACETAMINOPHEN 650 MG RE SUPP
650.0000 mg | Freq: Four times a day (QID) | RECTAL | Status: DC | PRN
  Administered 2018-10-16: 14:00:00 650 mg via RECTAL
  Filled 2018-10-16: qty 1

## 2018-10-16 MED ORDER — SODIUM CHLORIDE 0.9 % IV SOLN
2.0000 g | Freq: Once | INTRAVENOUS | Status: AC
  Administered 2018-10-16: 2 g via INTRAVENOUS
  Filled 2018-10-16: qty 2

## 2018-10-16 MED ORDER — LABETALOL HCL 200 MG PO TABS
200.0000 mg | ORAL_TABLET | Freq: Two times a day (BID) | ORAL | Status: DC
  Administered 2018-10-16 – 2018-10-30 (×29): 200 mg via ORAL
  Filled 2018-10-16 (×31): qty 1

## 2018-10-16 MED ORDER — MYCOPHENOLATE MOFETIL 250 MG PO CAPS: 250.0000 mg | ORAL_CAPSULE | Freq: Two times a day (BID) | ORAL | Status: DC

## 2018-10-16 MED ORDER — CITALOPRAM HYDROBROMIDE 20 MG PO TABS
20.0000 mg | ORAL_TABLET | Freq: Every day | ORAL | Status: DC
  Administered 2018-10-16 – 2018-10-29 (×14): 20 mg via ORAL
  Filled 2018-10-16 (×14): qty 1

## 2018-10-16 MED ORDER — SODIUM CHLORIDE 0.9 % IV SOLN
INTRAVENOUS | Status: DC
  Administered 2018-10-16 – 2018-10-18 (×3): via INTRAVENOUS

## 2018-10-16 MED ORDER — WARFARIN - PHARMACIST DOSING INPATIENT
Freq: Every day | Status: DC
  Administered 2018-10-16 – 2018-10-24 (×3)
  Filled 2018-10-16: qty 1

## 2018-10-16 MED ORDER — ONDANSETRON HCL 4 MG/2ML IJ SOLN
4.0000 mg | Freq: Four times a day (QID) | INTRAMUSCULAR | Status: DC | PRN
  Administered 2018-10-16 – 2018-10-19 (×2): 4 mg via INTRAVENOUS
  Filled 2018-10-16 (×2): qty 2

## 2018-10-16 MED ORDER — HYDRALAZINE HCL 20 MG/ML IJ SOLN
10.0000 mg | Freq: Four times a day (QID) | INTRAMUSCULAR | Status: DC | PRN
  Administered 2018-10-16: 10 mg via INTRAVENOUS
  Filled 2018-10-16: qty 1
  Filled 2018-10-16: qty 0.5

## 2018-10-16 MED ORDER — MOMETASONE FURO-FORMOTEROL FUM 100-5 MCG/ACT IN AERO
2.0000 | INHALATION_SPRAY | Freq: Two times a day (BID) | RESPIRATORY_TRACT | Status: DC
  Administered 2018-10-16 – 2018-10-30 (×27): 2 via RESPIRATORY_TRACT
  Filled 2018-10-16: qty 8.8

## 2018-10-16 MED ORDER — DIPHENOXYLATE-ATROPINE 2.5-0.025 MG PO TABS: 1.0000 | ORAL_TABLET | Freq: Four times a day (QID) | ORAL | Status: DC | PRN

## 2018-10-16 MED ORDER — SODIUM CHLORIDE 0.9 % IV SOLN
1.0000 g | INTRAVENOUS | Status: DC
  Filled 2018-10-16: qty 1

## 2018-10-16 MED ORDER — FLEET ENEMA 7-19 GM/118ML RE ENEM: 1.0000 | ENEMA | Freq: Once | RECTAL | Status: DC | PRN

## 2018-10-16 MED ORDER — PANTOPRAZOLE SODIUM 40 MG PO TBEC
40.0000 mg | DELAYED_RELEASE_TABLET | Freq: Every day | ORAL | Status: DC
  Administered 2018-10-16 – 2018-10-30 (×15): 40 mg via ORAL
  Filled 2018-10-16 (×15): qty 1

## 2018-10-16 MED ORDER — SODIUM CHLORIDE 0.9 % IV SOLN
Freq: Once | INTRAVENOUS | Status: AC
  Administered 2018-10-16: 10:00:00 via INTRAVENOUS

## 2018-10-16 MED ORDER — DONEPEZIL HCL 5 MG PO TABS
10.0000 mg | ORAL_TABLET | Freq: Every day | ORAL | Status: DC
  Administered 2018-10-16 – 2018-10-29 (×14): 10 mg via ORAL
  Filled 2018-10-16 (×14): qty 2

## 2018-10-16 MED ORDER — AMLODIPINE BESYLATE 10 MG PO TABS
10.0000 mg | ORAL_TABLET | Freq: Every day | ORAL | Status: DC
  Administered 2018-10-16 – 2018-10-30 (×15): 10 mg via ORAL
  Filled 2018-10-16: qty 2
  Filled 2018-10-16 (×14): qty 1

## 2018-10-16 MED ORDER — ACETAMINOPHEN 325 MG PO TABS
650.0000 mg | ORAL_TABLET | Freq: Four times a day (QID) | ORAL | Status: DC | PRN
  Administered 2018-10-19 – 2018-10-22 (×3): 650 mg via ORAL
  Filled 2018-10-16 (×3): qty 2

## 2018-10-16 MED ORDER — BUPROPION HCL ER (XL) 150 MG PO TB24
300.0000 mg | ORAL_TABLET | Freq: Every day | ORAL | Status: DC
  Administered 2018-10-16 – 2018-10-30 (×15): 300 mg via ORAL
  Filled 2018-10-16 (×15): qty 2

## 2018-10-16 MED ORDER — SODIUM CHLORIDE 0.9 % IV SOLN
2.0000 g | INTRAVENOUS | Status: DC
  Administered 2018-10-16: 2 g via INTRAVENOUS
  Filled 2018-10-16 (×2): qty 20

## 2018-10-16 MED ORDER — BISACODYL 5 MG PO TBEC: 5.0000 mg | DELAYED_RELEASE_TABLET | Freq: Every day | ORAL | Status: DC | PRN

## 2018-10-16 MED ORDER — HYDROCODONE-ACETAMINOPHEN 5-325 MG PO TABS: 1.0000 | ORAL_TABLET | ORAL | Status: DC | PRN

## 2018-10-16 MED ORDER — SODIUM CHLORIDE 0.9 % IV BOLUS
1000.0000 mL | Freq: Once | INTRAVENOUS | Status: AC
  Administered 2018-10-16: 1000 mL via INTRAVENOUS

## 2018-10-16 NOTE — Consult Note (Signed)
Pharmacy Antibiotic Note  Eric Gill is a 68 y.o. male admitted on 10/16/2018 with UTI.  Pharmacy has been consulted for Cefepime dosing. Patient has acute renal failure on CKD. H/O of kidney transplant.   Patient received Cefepime 2g IV x 1 dose in ED.  Plan: Start Cefepime 1g IV every 24 hours based on current CrCl < 50mL/min.   Pharmacy will continue to monitor and adjust doses as needed if renal function improves.   Height: 5\' 3"  (160 cm) Weight: 128 lb (58.1 kg) IBW/kg (Calculated) : 56.9  Temp (24hrs), Avg:99.2 F (37.3 C), Min:99.2 F (37.3 C), Max:99.2 F (37.3 C)  Recent Labs  Lab 10/15/18 2324 10/16/18 0226  WBC 8.7  --   CREATININE 3.51*  --   LATICACIDVEN  --  0.9    Estimated Creatinine Clearance: 15.8 mL/min (A) (by C-G formula based on SCr of 3.51 mg/dL (H)).    Allergies  Allergen Reactions  . Losartan Cough  . Statins Nausea And Vomiting and Other (See Comments)    Reaction:  Muscle pain     Antimicrobials this admission: 9/8 cefepime >>   Microbiology results: 9/8 BCx: pending 9/8 UCx: pending  Thank you for allowing pharmacy to be a part of this patient's care.  Pernell Dupre, PharmD, BCPS Clinical Pharmacist 10/16/2018 7:58 AM

## 2018-10-16 NOTE — ED Notes (Addendum)
Patient incontinent of stool and with episode of vomiting. Patient given IV zofran. Cleaned of stool and clean sheets and bed pads placed.

## 2018-10-16 NOTE — H&P (Addendum)
[facility] [place] NAME: Eric Gill    MR#:  [id number]  DATE OF BIRTH:  [birth year]  DATE OF ADMISSION:  10/16/2018  PRIMARY CARE PHYSICIAN: Idelle Crouch, MD   REQUESTING/REFERRING PHYSICIAN: Duffy Bruce, MD  CHIEF COMPLAINT:   Chief Complaint  Patient presents with  . Urinary Frequency  . Weakness   Urinary Frequency and Weakness HISTORY OF PRESENT ILLNESS:  Eric Gill  is a 68 y.o. male with a known history of  . Anemia  . Antiphospholipid antibody syndrome ([place])  . Arthritis  . DVT (deep venous thrombosis) ([place])  . Essential hypertension  . Gastric ulcer  . GERD (gastroesophageal reflux disease)  . History of CMV  . Hyperlipidemia  . Hyperparathyroidism ([place])  . Pedal edema  . Peripheral vascular disease ([place])  . Rosacea  . Status post kidney transplant  . Stroke ([place])  . TIA (transient ischemic attack)  . Vascular dementia without behavioral disturbance [facility])  The patient presented in ED with above chief complains. He first noticed lower suprapubic abd pain along with sensation of burning with urination. He also has weakness, fever and chills. He stubbed his toe, and has some bruising associated on right second toe. He is found renal failure and UTI, treated with abx and IVF. PAST MEDICAL HISTORY:   Past Medical History:  Diagnosis Date  . Anemia   . Antiphospholipid antibody syndrome ([place])   . Arthritis   . DVT (deep venous thrombosis) ([place])   . Essential hypertension   . Gastric ulcer   . GERD (gastroesophageal reflux disease)   . History of CMV   . Hyperlipidemia   . Hyperparathyroidism ([place])   . Pedal edema   . Peripheral vascular disease ([place])   . Rosacea   . Status post kidney transplant 2001  . Stroke ([place])   . TIA (transient ischemic attack)   . Vascular dementia without behavioral disturbance ([place])     PAST SURGICAL HISTORY:   Past Surgical History:  Procedure Laterality Date   . CHOLECYSTECTOMY    . COLONOSCOPY WITH PROPOFOL N/A 03/06/2015   Procedure: COLONOSCOPY WITH PROPOFOL;  Surgeon: Josefine Class, MD;  Location: [facility] ENDOSCOPY;  Service: Endoscopy;  Laterality: N/A;  . ELBOW SURGERY    . ESOPHAGOGASTRODUODENOSCOPY Left 03/01/2015   Procedure: ESOPHAGOGASTRODUODENOSCOPY (EGD);  Surgeon: Hulen Luster, MD;  Location: [facility] ENDOSCOPY;  Service: Endoscopy;  Laterality: Left;  . ESOPHAGOGASTRODUODENOSCOPY (EGD) WITH PROPOFOL  03/06/2015   Procedure: ESOPHAGOGASTRODUODENOSCOPY (EGD) WITH PROPOFOL;  Surgeon: Josefine Class, MD;  Location: [facility] ENDOSCOPY;  Service: Endoscopy;;  . ESOPHAGOGASTRODUODENOSCOPY (EGD) WITH PROPOFOL N/A 05/25/2015   Procedure: ESOPHAGOGASTRODUODENOSCOPY (EGD) WITH PROPOFOL;  Surgeon: Josefine Class, MD;  Location: [facility] ENDOSCOPY;  Service: Endoscopy;  Laterality: N/A;  . KIDNEY TRANSPLANT    . KNEE ARTHROSCOPY      SOCIAL HISTORY:   Social History   Tobacco Use  . Smoking status: Former Smoker    Packs/day: 1.00    Years: 30.00    Pack years: 30.00    Types: Cigarettes    Quit date: 1999    Years since quitting: 21.7  . Smokeless tobacco: Never Used  Substance Use Topics  . Alcohol use: No    FAMILY HISTORY:  No family history on file.  DRUG ALLERGIES:   Allergies  Allergen Reactions  . Losartan Cough  . Statins Nausea And Vomiting and Other (See Comments)    Reaction:  Muscle pain  REVIEW OF SYSTEMS:   Review of Systems  Constitutional: Positive for malaise/fatigue. Negative for chills and fever.  HENT: Negative for sore throat.   Eyes: Negative for blurred vision and double vision.  Respiratory: Negative for cough, hemoptysis, shortness of breath, wheezing and stridor.   Cardiovascular: Negative for chest pain, palpitations, orthopnea and leg swelling.  Gastrointestinal: Negative for abdominal pain, blood in stool, diarrhea, melena, nausea and vomiting.  Genitourinary: Positive for dysuria, frequency  and urgency. Negative for flank pain and hematuria.  Musculoskeletal: Negative for back pain and joint pain.  Skin: Negative for rash.  Neurological: Negative for dizziness, sensory change, focal weakness, seizures, loss of consciousness, weakness and headaches.  Endo/Heme/Allergies: Negative for polydipsia.  Psychiatric/Behavioral: Negative for depression. The patient is not nervous/anxious.     MEDICATIONS AT HOME:   Prior to Admission medications   Medication Sig Start Date End Date Taking? Authorizing Provider  amLODipine (NORVASC) 10 MG tablet Take 10 mg by mouth daily.    [provider]  budesonide-formoterol (SYMBICORT) 80-4.5 MCG/ACT inhaler Inhale 2 puffs into the lungs 2 (two) times daily.    [provider]  buPROPion (WELLBUTRIN XL) 150 MG 24 hr tablet Take 300 mg by mouth daily.  06/16/16   [provider]  calcitRIOL (ROCALTROL) 0.25 MCG capsule Take 0.25 mcg by mouth at bedtime.    [provider]  citalopram (CELEXA) 20 MG tablet Take 20 mg by mouth daily.    [provider]  COUMADIN 1 MG tablet Take 2-3 mg by mouth as directed. 3 mg Monday, Weds, Friday. 2 mg Tues, Thursday, Sat and Sun 02/22/17   [provider]  dicyclomine (BENTYL) 10 MG capsule Take 10 mg by mouth 3 (three) times daily before meals.  01/01/18   [provider]  diphenoxylate-atropine (LOMOTIL) 2.5-0.025 MG tablet Take 1 tablet by mouth 4 (four) times daily as needed for diarrhea or loose stools.    [provider]  donepezil (ARICEPT) 10 MG tablet Take 10 mg by mouth at bedtime.    [provider]  fluorouracil (EFUDEX) 5 % cream  02/23/17   [provider]  furosemide (LASIX) 40 MG tablet Take 40 mg by mouth daily as needed.  02/08/17   [provider]  labetalol (NORMODYNE) 200 MG tablet Take 200 mg by mouth 2 (two) times daily.     [provider]  magnesium oxide (MAG-OX) 400 MG tablet TAKE ONE  TABLET BY MOUTH TWICE DAILY 02/22/17   [provider]  Multiple Vitamin (MULTI-VITAMINS) TABS Take 1 tablet by mouth daily.     [provider]  mycophenolate (CELLCEPT) 250 MG capsule Take 250-500 mg by mouth 2 (two) times daily. 250 mg in the am and 500 mg at bedtime    [provider]  pantoprazole (PROTONIX) 40 MG tablet Take 1 tablet (40 mg total) by mouth 2 (two) times daily before a meal. Patient taking differently: Take 40 mg by mouth daily.  03/06/15   Epifanio Lesches, MD  predniSONE (DELTASONE) 5 MG tablet Take 1 tablet (5 mg total) by mouth daily with breakfast. Patient not taking: Reported on 10/16/2018 08/15/18   Fritzi Mandes, MD  tacrolimus (PROGRAF) 0.5 MG capsule Take 1 capsule by mouth 2 (two) times a day. 04/28/18 04/28/19  [provider]      VITAL SIGNS:  Blood pressure (!) 124/111, pulse 95, temperature 99.2 F (37.3 C), temperature source Oral, resp. rate (!) 21, height 5\' 3"  (1.6  m), weight 58.1 kg, SpO2 93 %.  PHYSICAL EXAMINATION:  Physical Exam  GENERAL:  68 y.o.-year-old patient lying in the bed with no acute distress.  EYES: Pupils equal, round, reactive to light and accommodation. No scleral icterus. Extraocular muscles intact.  HEENT: Head atraumatic, normocephalic. NECK:  Supple, no jugular venous distention. No thyroid enlargement, no tenderness.  LUNGS: Normal breath sounds bilaterally, no wheezing, rales,rhonchi or crepitation. No use of accessory muscles of respiration.  CARDIOVASCULAR: S1, S2 normal. No murmurs, rubs, or gallops.  ABDOMEN: Soft, nontender, nondistended. Bowel sounds present. No organomegaly or mass.  EXTREMITIES: No pedal edema, cyanosis, or clubbing.  NEUROLOGIC: Cranial nerves II through XII are intact. Muscle strength 4/5 in all extremities. Sensation intact. Gait not checked.  PSYCHIATRIC: The patient is alert and oriented x 2-3.  SKIN: No obvious rash, lesion, or ulcer.   LABORATORY PANEL:    CBC Recent Labs  Lab 10/15/18 2324  WBC 8.7  HGB 11.5*  HCT 36.8*  PLT 146*   ------------------------------------------------------------------------------------------------------------------  Chemistries  Recent Labs  Lab 10/15/18 2324  NA 141  K 3.9  CL 112*  CO2 21*  GLUCOSE 107*  BUN 33*  CREATININE 3.51*  CALCIUM 9.4   ------------------------------------------------------------------------------------------------------------------  Cardiac Enzymes No results for input(s): TROPONINI in the last 168 hours. ------------------------------------------------------------------------------------------------------------------  RADIOLOGY:  Dg Foot Complete Right  Result Date: 10/16/2018 CLINICAL DATA:  Fall, foot pain and bruising EXAM: RIGHT FOOT COMPLETE - 3+ VIEW COMPARISON:  None. FINDINGS: No fracture or dislocation. There is diffuse osteopenia. Surgical clips seen overlying the medial aspect of the ankle. Scattered dense vascular calcifications. IMPRESSION: No acute osseous abnormality. Electronically Signed   By: Prudencio Pair M.D.   On: 10/16/2018 02:32      IMPRESSION AND PLAN:   ARF on CKD, h/o kidney transplant. The patient is being admitted to medical floor. Hold lasix, IVF and f/u BMP.  HTN emergency Continue HTN meds except lasix. Iv hydralazine prn.  UTI. Continue cefepime, f/u U/C. H/O DVT, PVD, CVA, coumadin PTD.  All the records are reviewed and case discussed with ED provider. Management plans discussed with the patient, his wife and they are in agreement.  CODE STATUS: Full code.  TOTAL TIME TAKING CARE OF THIS PATIENT:  48 minutes.    Demetrios Loll M.D on 10/16/2018 at 7:46 AM  Between 7am to 6pm - Pager - [phone]  After 6pm go to [email or web address] - Proofreader  [facility] [place] [facility]  Office  [phone]  CC: Primary care physician; Idelle Crouch, MD   Note: This dictation was prepared with Dragon  dictation along with smaller phrase technology. Any transcriptional errors that result from this process are unin

## 2018-10-16 NOTE — Consult Note (Signed)
ANTICOAGULATION CONSULT NOTE - Initial Consult  Pharmacy Consult for Warfarin Dosing and Monitoring Indication: VTE Treatment   Allergies  Allergen Reactions  . Losartan Cough  . Statins Nausea And Vomiting and Other (See Comments)    Reaction:  Muscle pain    Patient Measurements: Height: 5\' 3"  (160 cm) Weight: 128 lb (58.1 kg) IBW/kg (Calculated) : 56.9  Vital Signs: Temp: 99.2 F (37.3 C) (09/07 2308) Temp Source: Oral (09/07 2308) BP: 124/111 (09/08 0700) Pulse Rate: 95 (09/08 0700)  Labs: Recent Labs    10/15/18 2324 10/15/18 2329 10/16/18 0226  HGB 11.5*  --   --   HCT 36.8*  --   --   PLT 146*  --   --   LABPROT  --   --  25.8*  INR  --   --  2.4*  CREATININE 3.51*  --   --   TROPONINIHS  --  22*  --     Estimated Creatinine Clearance: 15.8 mL/min (A) (by C-G formula based on SCr of 3.51 mg/dL (H)).   Medical History: Past Medical History:  Diagnosis Date  . Anemia   . Antiphospholipid antibody syndrome ([place])   . Arthritis   . DVT (deep venous thrombosis) ([place])   . Essential hypertension   . Gastric ulcer   . GERD (gastroesophageal reflux disease)   . History of CMV   . Hyperlipidemia   . Hyperparathyroidism ([place])   . Pedal edema   . Peripheral vascular disease ([place])   . Rosacea   . Status post kidney transplant 2001  . Stroke ([place])   . TIA (transient ischemic attack)   . Vascular dementia without behavioral disturbance [facility])    Assessment: Pharmacy consulted for warfarin dosing and monitoring in 68 yo male with PMH of DVT. Patient has been admitted with UTI and Acute Renal Failure on CKD w/ PMH of kidney transplant. INR therapeutic on admission.   Home Regimen: Warfarin 3mg : Mon, Wed, Fri                              Warfarin 2mg : [place], Sat, Sun  DATE INR DOSE 9/8 2.4  Goal of Therapy:  INR 2-3 Monitor platelets by anticoagulation protocol: Yes   Plan:  9/8 Will order patient's home dose of Warfarin 2mg  tonight. INR ordered  with AM labs.   Plan for INRs daily per protocol while receiving antibiotics. CBC at least every 3 days per protocol.  Pharmacy will continue to follow and order dose based on levels.   Pernell Dupre, PharmD, BCPS Clinical Pharmacist 10/16/2018 8:06 AM

## 2018-10-16 NOTE — ED Notes (Signed)
Pt checked and dry at this time. Pt resting comfortably at this time

## 2018-10-16 NOTE — Progress Notes (Signed)
PHARMACY - PHYSICIAN COMMUNICATION CRITICAL VALUE ALERT - BLOOD CULTURE IDENTIFICATION (BCID)  Eric Gill is an 67 y.o. male who presented to [facility] on 10/16/2018  Assessment: 1/4 bottles (anaerobic) Klebsiella oxytoca, K[facility] -  Name of physician (or Provider) Contacted: Dr. Bridgett Larsson  Current antibiotics: cefepime  Changes to prescribed antibiotics recommended: ceftriaxone 2 g q24h  Results for orders placed or performed during the hospital encounter of 10/16/18  Blood Culture ID Panel (Reflexed) (Collected: 10/16/2018  2:26 AM)  Result Value Ref Range   Enterococcus species NOT DETECTED NOT DETECTED   Listeria monocytogenes NOT DETECTED NOT DETECTED   Staphylococcus species NOT DETECTED NOT DETECTED   Staphylococcus aureus (BCID) NOT DETECTED NOT DETECTED   Streptococcus species NOT DETECTED NOT DETECTED   Streptococcus agalactiae NOT DETECTED NOT DETECTED   Streptococcus pneumoniae NOT DETECTED NOT DETECTED   Streptococcus pyogenes NOT DETECTED NOT DETECTED   Acinetobacter baumannii NOT DETECTED NOT DETECTED   Enterobacteriaceae species DETECTED (A) NOT DETECTED   Enterobacter cloacae complex NOT DETECTED NOT DETECTED   Escherichia coli NOT DETECTED NOT DETECTED   Klebsiella oxytoca DETECTED (A) NOT DETECTED   Klebsiella pneumoniae NOT DETECTED NOT DETECTED   Proteus species NOT DETECTED NOT DETECTED   Serratia marcescens NOT DETECTED NOT DETECTED   Carbapenem resistance NOT DETECTED NOT DETECTED   Haemophilus influenzae NOT DETECTED NOT DETECTED   Neisseria meningitidis NOT DETECTED NOT DETECTED   Pseudomonas aeruginosa NOT DETECTED NOT DETECTED   Candida albicans NOT DETECTED NOT DETECTED   Candida glabrata NOT DETECTED NOT DETECTED   Candida krusei NOT DETECTED NOT DETECTED   Candida parapsilosis NOT DETECTED NOT DETECTED   Candida tropicalis NOT DETECTED NOT DETECTED    Tawnya Crook, PharmD 10/16/2018  6:19 PM

## 2018-10-16 NOTE — Progress Notes (Signed)
Advanced Care Plan.  Purpose of Encounter: CODE STATUS. Parties in Attendance: The patient and me. [name] Decisional Capacity: Yes. Medical Story: Eric Gill  is a 68 y.o. male with a known history of  . Anemia  . Antiphospholipid antibody syndrome ([place])  . Arthritis  . DVT (deep venous thrombosis) ([place])  . Essential hypertension  . Gastric ulcer  . GERD (gastroesophageal reflux disease)  . History of CMV  . Hyperlipidemia  . Hyperparathyroidism ([place])  . Pedal edema  . Peripheral vascular disease ([place])  . Rosacea  . Status post kidney transplant  . Stroke ([place])  . TIA (transient ischemic attack)  . Vascular dementia without behavioral disturbance [facility])  The patient is being admitted for acute renal failure on CKD stage III, hypertension emergency and UTI.  I discussed with patient about his current condition, prognosis and CODE STATUS.  The patient wants to be resuscitated and intubated if he has cardiopulmonary arrest..  Plan:  Code Status: Full code. Time spent discussing advance care planning: 17 minutes.

## 2018-10-16 NOTE — Progress Notes (Signed)
PHARMACY -  BRIEF ANTIBIOTIC NOTE   Pharmacy has received consult(s) for Cefepime from an ED provider.  The patient's profile has been reviewed for ht/wt/allergies/indication/available labs.    One time order(s) placed for Cefepime 2g  Further antibiotics/pharmacy consults should be ordered by admitting physician if indicated.                       Thank you, Vira Blanco 10/16/2018  2:27 AM

## 2018-10-16 NOTE — ED Notes (Signed)
Pt provided gingerale upon request.

## 2018-10-16 NOTE — ED Provider Notes (Addendum)
[facility] Emergency Department Provider Note  ____________________________________________   First MD Initiated Contact with Patient 10/16/18 0143     (approximate)  I have reviewed the triage vital signs and the nursing notes.   HISTORY  Chief Complaint Urinary Frequency and Weakness    HPI Eric Gill is a 68 y.o. male  With h/o antiphospholipid syndrome, PVD, vascular dementia, CKD s/p kidney transplant (baseline Cr around 3.1-3.3) here with fever,chills, rigors. Pt sx started over the past 24 hours. He first noticed lower suprapubic abd pain along with sensation of burning with urination. He then developed fatigue, nausea, and uncontrollable rigors with temp up to 101F at home. He has a h/o sepsis 2/2 UTi with similar sx. No abd pain or pain over his transplant site. No cough, SOB. No known sick contacts. While getting ready, pt felt weak on his legs and actually stubbed his toe, and has some bruising associated with it (right second toe). No head injury.        Past Medical History:  Diagnosis Date  . Anemia   . Antiphospholipid antibody syndrome ([place])   . Arthritis   . DVT (deep venous thrombosis) ([place])   . Essential hypertension   . Gastric ulcer   . GERD (gastroesophageal reflux disease)   . History of CMV   . Hyperlipidemia   . Hyperparathyroidism ([place])   . Pedal edema   . Peripheral vascular disease ([place])   . Rosacea   . Status post kidney transplant 2001  . Stroke ([place])   . TIA (transient ischemic attack)   . Vascular dementia without behavioral disturbance [facility])     Patient Active Problem List   Diagnosis Date Noted  . Renal failure (ARF), acute on chronic (HCC) 10/16/2018  . Rectal bleeding   . GI bleed 02/23/2015  . Acute blood loss anemia 02/23/2015    Past Surgical History:  Procedure Laterality Date  . CHOLECYSTECTOMY    . COLONOSCOPY WITH PROPOFOL N/A 03/06/2015   Procedure: COLONOSCOPY WITH PROPOFOL;  Surgeon:  Josefine Class, MD;  Location: [facility] ENDOSCOPY;  Service: Endoscopy;  Laterality: N/A;  . ELBOW SURGERY    . ESOPHAGOGASTRODUODENOSCOPY Left 03/01/2015   Procedure: ESOPHAGOGASTRODUODENOSCOPY (EGD);  Surgeon: Hulen Luster, MD;  Location: [facility] ENDOSCOPY;  Service: Endoscopy;  Laterality: Left;  . ESOPHAGOGASTRODUODENOSCOPY (EGD) WITH PROPOFOL  03/06/2015   Procedure: ESOPHAGOGASTRODUODENOSCOPY (EGD) WITH PROPOFOL;  Surgeon: Josefine Class, MD;  Location: [facility] ENDOSCOPY;  Service: Endoscopy;;  . ESOPHAGOGASTRODUODENOSCOPY (EGD) WITH PROPOFOL N/A 05/25/2015   Procedure: ESOPHAGOGASTRODUODENOSCOPY (EGD) WITH PROPOFOL;  Surgeon: Josefine Class, MD;  Location: [facility] ENDOSCOPY;  Service: Endoscopy;  Laterality: N/A;  . KIDNEY TRANSPLANT    . KNEE ARTHROSCOPY      Prior to Admission medications   Medication Sig Start Date End Date Taking? Authorizing Provider  amLODipine (NORVASC) 10 MG tablet Take 10 mg by mouth daily.   Yes [provider]  budesonide-formoterol (SYMBICORT) 80-4.5 MCG/ACT inhaler Inhale 2 puffs into the lungs 2 (two) times daily.   Yes [provider]  buPROPion (WELLBUTRIN XL) 300 MG 24 hr tablet Take 300 mg by mouth daily. 08/17/18  Yes [provider]  calcitRIOL (ROCALTROL) 0.25 MCG capsule Take 0.25 mcg by mouth at bedtime.   Yes [provider]  citalopram (CELEXA) 20 MG tablet Take 20 mg by mouth daily.   Yes [provider]  donepezil (ARICEPT) 10 MG tablet Take 10 mg by mouth at bedtime.  Yes [provider]  furosemide (LASIX) 40 MG tablet Take 40 mg by mouth daily as needed.  02/08/17  Yes [provider]  labetalol (NORMODYNE) 200 MG tablet Take 200 mg by mouth 2 (two) times daily.    Yes [provider]  magnesium oxide (MAG-OX) 400 MG tablet TAKE ONE TABLET BY MOUTH TWICE DAILY 02/22/17  Yes [provider]  Multiple Vitamin (MULTI-VITAMINS) TABS Take 1 tablet by mouth daily.    Yes  [provider]  mycophenolate (CELLCEPT) 250 MG capsule Take 250-500 mg by mouth 2 (two) times daily. 250 mg in the am and 500 mg at bedtime   Yes [provider]  pantoprazole (PROTONIX) 40 MG tablet Take 1 tablet (40 mg total) by mouth 2 (two) times daily before a meal. Patient taking differently: Take 40 mg by mouth daily.  03/06/15  Yes Epifanio Lesches, MD  potassium chloride (K-DUR) 10 MEQ tablet Take 10 mEq by mouth 2 (two) times daily. 10/01/18  Yes [provider]  tacrolimus (PROGRAF) 0.5 MG capsule Take 1 capsule by mouth 2 (two) times a day. 04/28/18 04/28/19 Yes [provider]  warfarin (JANTOVEN) 1 MG tablet Take 2-3 mg by mouth daily. Take two tablets (2mg ) on Sunday, Tuesday, Thursday and Saturday. Take three tablets (3 mg) on Monday, Wednesday and Friday. 08/28/18  Yes [provider]  dicyclomine (BENTYL) 10 MG capsule Take 10 mg by mouth 3 (three) times daily before meals.  01/01/18   [provider]  diphenoxylate-atropine (LOMOTIL) 2.5-0.025 MG tablet Take 1 tablet by mouth 4 (four) times daily as needed for diarrhea or loose stools.    [provider]  fluorouracil (EFUDEX) 5 % cream  02/23/17   [provider]  predniSONE (DELTASONE) 5 MG tablet Take 1 tablet (5 mg total) by mouth daily with breakfast. Patient not taking: Reported on 10/16/2018 08/15/18   Fritzi Mandes, MD    Allergies Losartan and Statins  No family history on file.  Social History Social History   Tobacco Use  . Smoking status: Former Smoker    Packs/day: 1.00    Years: 30.00    Pack years: 30.00    Types: Cigarettes    Quit date: 1999    Years since quitting: 21.7  . Smokeless tobacco: Never Used  Substance Use Topics  . Alcohol use: No  . Drug use: No    Review of Systems  Review of Systems  Constitutional: Positive for chills, fatigue and fever.  Gastrointestinal: Positive for nausea.  Genitourinary: Positive for  dysuria, flank pain and frequency.  Musculoskeletal: Positive for arthralgias.  All other systems reviewed and are negative.    ____________________________________________  PHYSICAL EXAM:      VITAL SIGNS: ED Triage Vitals  Enc Vitals Group     BP 10/15/18 2308 (!) 180/86     Pulse Rate 10/15/18 2308 86     Resp 10/15/18 2308 18     Temp 10/15/18 2308 99.2 F (37.3 C)     Temp Source 10/15/18 2308 Oral     SpO2 10/15/18 2308 97 %     Weight 10/15/18 2309 128 lb (58.1 kg)     Height 10/15/18 2309 5\' 3"  (1.6 m)     Head Circumference --      [place] Flow --      Pain Score 10/15/18 2308 0     Pain Loc --      Pain Edu? --  Excl. in [place]? --      Physical Exam Vitals signs and nursing note reviewed.  Constitutional:      General: He is not in acute distress.    Appearance: He is well-developed.  HENT:     Head: Normocephalic and atraumatic.     Mouth/Throat:     Mouth: Mucous membranes are dry.  Eyes:     Conjunctiva/sclera: Conjunctivae normal.  Neck:     Musculoskeletal: Neck supple.  Cardiovascular:     Rate and Rhythm: Regular rhythm. Tachycardia present.     Heart sounds: Normal heart sounds. No murmur. No friction rub.  Pulmonary:     Effort: Pulmonary effort is normal. No respiratory distress.     Breath sounds: Normal breath sounds. No wheezing or rales.  Abdominal:     General: There is no distension.     Palpations: Abdomen is soft.     Tenderness: There is no abdominal tenderness.     Comments: No TTP over LLQ transplant site  Musculoskeletal:     Comments: Right second toe with medial deviation and diffuse ecchymoses. No open wounds.  Skin:    General: Skin is warm.     Capillary Refill: Capillary refill takes less than 2 seconds.  Neurological:     Mental Status: He is alert and oriented to person, place, and time.     Motor: No abnormal muscle tone.       ____________________________________________   LABS (all labs ordered are listed,  but only abnormal results are displayed)  Labs Reviewed  CULTURE, BLOOD (ROUTINE X 2) - Abnormal; Notable for the following components:      Result Value   Culture KLEBSIELLA OXYTOCA (*)    All other components within normal limits  BLOOD CULTURE ID PANEL (REFLEXED) - Abnormal; Notable for the following components:   Enterobacteriaceae species DETECTED (*)    Klebsiella oxytoca DETECTED (*)    All other components within normal limits  BASIC METABOLIC PANEL - Abnormal; Notable for the following components:   Chloride 112 (*)    CO2 21 (*)    Glucose, Bld 107 (*)    BUN 33 (*)    Creatinine, Ser 3.51 (*)    GFR calc non Af Amer 17 (*)    GFR calc Af Amer 19 (*)    All other components within normal limits  CBC - Abnormal; Notable for the following components:   RBC 3.96 (*)    Hemoglobin 11.5 (*)    HCT 36.8 (*)    Platelets 146 (*)    All other components within normal limits  URINALYSIS, COMPLETE (UACMP) WITH MICROSCOPIC - Abnormal; Notable for the following components:   Color, Urine YELLOW (*)    APPearance HAZY (*)    Hgb urine dipstick MODERATE (*)    Protein, ur 30 (*)    Leukocytes,Ua MODERATE (*)    RBC / HPF >50 (*)    WBC, UA >50 (*)    Bacteria, UA RARE (*)    All other components within normal limits  TACROLIMUS LEVEL - Abnormal; Notable for the following components:   Tacrolimus (FK506) - [facility] <1.0 (*)    All other components within normal limits  PROTIME-INR - Abnormal; Notable for the following components:   Prothrombin Time 25.8 (*)    INR 2.4 (*)    All other components within normal limits  BASIC METABOLIC PANEL - Abnormal; Notable for the following components:   Chloride 114 (*)  CO2 19 (*)    BUN 38 (*)    Creatinine, Ser 3.71 (*)    Calcium 8.4 (*)    GFR calc non Af Amer 16 (*)    GFR calc Af Amer 18 (*)    All other components within normal limits  CBC - Abnormal; Notable for the following components:   WBC 11.2 (*)    RBC 3.39 (*)     Hemoglobin 9.6 (*)    HCT 32.1 (*)    MCHC 29.9 (*)    Platelets 108 (*)    All other components within normal limits  PROTIME-INR - Abnormal; Notable for the following components:   Prothrombin Time 26.9 (*)    INR 2.5 (*)    All other components within normal limits  PROTIME-INR - Abnormal; Notable for the following components:   Prothrombin Time 26.9 (*)    INR 2.5 (*)    All other components within normal limits  BASIC METABOLIC PANEL - Abnormal; Notable for the following components:   Chloride 117 (*)    CO2 17 (*)    BUN 34 (*)    Creatinine, Ser 3.86 (*)    Calcium 8.7 (*)    GFR calc non Af Amer 15 (*)    GFR calc Af Amer 17 (*)    All other components within normal limits  TROPONIN I (HIGH SENSITIVITY) - Abnormal; Notable for the following components:   Troponin I (High Sensitivity) 22 (*)    All other components within normal limits  URINE CULTURE  CULTURE, BLOOD (ROUTINE X 2)  SARS CORONAVIRUS 2 (HOSPITAL ORDER, [place] [facility])  LACTIC ACID, PLASMA  PROTIME-INR  CBC  RENAL FUNCTION PANEL    ____________________________________________  EKG: Normal sinus rhythm, VR 77. Normal intervals. No acute St-t segment changes. No ischemia or infarct. ________________________________________  RADIOLOGY All imaging, including plain films, CT scans, and ultrasounds, independently reviewed by me, and interpretations confirmed via formal radiology reads.  ED MD interpretation:   Plain film of the foot: No acute abnormality, though on my review, there appears to be possible mildly displaced fracture of the second proximal phalanx  Official radiology report(s): No results found.  ____________________________________________  PROCEDURES   Procedure(s) performed (including Critical Care):  Procedures  ____________________________________________  INITIAL IMPRESSION / MDM / [place] / ED COURSE  As part of my medical decision  making, I reviewed the following data within the electronic MEDICAL RECORD NUMBER Notes from prior ED visits and [place] Controlled Substance Database      *Eric Gill was evaluated in Emergency Department on 10/19/2018 for the symptoms described in the history of present illness. He was evaluated in the context of the global COVID-19 pandemic, which necessitated consideration that the patient might be at risk for infection with the SARS-CoV-2 virus that causes COVID-19. Institutional protocols and algorithms that pertain to the evaluation of patients at risk for COVID-19 are in a state of rapid change based on information released by regulatory bodies including the CDC and federal and state organizations. These policies and algorithms were followed during the patient's care in the ED.  Some ED evaluations and interventions may be delayed as a result of limited staffing during the pandemic.*   Clinical Course as of Oct 18 536  Tue Oct 15, 5385  8463 68 year old male here with fever, generalized weakness.  History of renal transplant.  Lab work shows significant UTI.  He has also has a mild acute  on chronic kidney injury.  Given his immunosuppressed status, will start cefepime and admit.  Cultures have been sent.  Patient also has a significant sprain versus occult fracture of the right second toe.  Following nerve block, this was straightened and placed in buddy tape.  Tolerated well.  Can be weightbearing as tolerated with outpatient follow-up within the next 1 to 2 weeks for this.   [CI]  1359 Specific Gravity, Urine: 1.011 [RK]    Clinical Course User Index [CI] Duffy Bruce, MD [RK] Lavonia Drafts, MD    Medical Decision Making: As above.  ____________________________________________  FINAL CLINICAL IMPRESSION(S) / ED DIAGNOSES  Final diagnoses:  Lower urinary tract infectious disease  Sprain of interphalangeal joint of right lesser toe(s), init     MEDICATIONS GIVEN DURING THIS  VISIT:  Medications  acetaminophen (TYLENOL) tablet 650 mg ( Oral See Alternative 10/16/18 1339)    Or  acetaminophen (TYLENOL) suppository 650 mg (650 mg Rectal Given 10/16/18 1339)  ondansetron (ZOFRAN) tablet 4 mg ( Oral See Alternative 10/16/18 1339)    Or  ondansetron (ZOFRAN) injection 4 mg (4 mg Intravenous Given 10/16/18 1339)  albuterol (PROVENTIL) (2.5 MG/3ML) 0.083% nebulizer solution 2.5 mg (has no administration in time range)  HYDROcodone-acetaminophen (NORCO/VICODIN) 5-325 MG per tablet 1-2 tablet (has no administration in time range)  bisacodyl (DULCOLAX) EC tablet 5 mg (has no administration in time range)  amLODipine (NORVASC) tablet 10 mg (10 mg Oral Given 10/18/18 0919)  labetalol (NORMODYNE) tablet 200 mg (200 mg Oral Given 10/18/18 2208)  buPROPion (WELLBUTRIN XL) 24 hr tablet 300 mg (300 mg Oral Given 10/18/18 0920)  citalopram (CELEXA) tablet 20 mg (20 mg Oral Given 10/18/18 0920)  donepezil (ARICEPT) tablet 10 mg (10 mg Oral Given 10/18/18 2208)  calcitRIOL (ROCALTROL) capsule 0.25 mcg (0.25 mcg Oral Given 10/18/18 2216)  dicyclomine (BENTYL) capsule 10 mg (10 mg Oral Given 10/18/18 1618)  diphenoxylate-atropine (LOMOTIL) 2.5-0.025 MG per tablet 1 tablet (has no administration in time range)  magnesium oxide (MAG-OX) tablet 400 mg (400 mg Oral Given 10/18/18 2208)  pantoprazole (PROTONIX) EC tablet 40 mg (40 mg Oral Given 10/18/18 0920)  tacrolimus (PROGRAF) capsule 0.5 mg (0.5 mg Oral Given 10/18/18 2217)  mometasone-formoterol (DULERA) 100-5 MCG/ACT inhaler 2 puff (2 puffs Inhalation Given 10/18/18 2209)  hydrALAZINE (APRESOLINE) injection 10 mg (10 mg Intravenous Given 10/16/18 1017)  Warfarin - Pharmacist Dosing Inpatient ( Does not apply Given 10/17/18 1800)  mycophenolate (CELLCEPT) capsule 250 mg (250 mg Oral Given 10/18/18 0922)    And  mycophenolate (CELLCEPT) capsule 500 mg (500 mg Oral Given 10/18/18 2216)  ceFEPIme (MAXIPIME) 2 g in sodium chloride 0.9 % 100 mL IVPB (2 g  Intravenous New Bag/Given 10/18/18 1709)  sodium bicarbonate 150 mEq in dextrose 5% 1000 mL infusion (150 mEq Intravenous Incomplete 10/19/18 0212)  sodium chloride 0.9 % bolus 1,000 mL (0 mLs Intravenous Stopped 10/16/18 0452)  0.9 %  sodium chloride infusion ( Intravenous Stopped 10/16/18 1217)  ceFEPIme (MAXIPIME) 2 g in sodium chloride 0.9 % 100 mL IVPB (0 g Intravenous Stopped 10/16/18 0352)  bupivacaine (MARCAINE) 0.5 % injection 10 mL (10 mLs Infiltration Given 10/16/18 0329)  warfarin (COUMADIN) tablet 2 mg (2 mg Oral Given 10/16/18 1845)  warfarin (COUMADIN) tablet 3 mg (3 mg Oral Given 10/17/18 1600)  warfarin (COUMADIN) tablet 2 mg (2 mg Oral Given 10/18/18 1707)     ED Discharge Orders    None       Note:  This  document was prepared using Systems analyst and may include unintentional dictation errors.   Duffy Bruce, MD 10/16/18 [id number]    Duffy Bruce, MD 10/19/18 [phone]

## 2018-10-16 NOTE — ED Notes (Addendum)
ED TO INPATIENT HANDOFF REPORT  ED Nurse Name and Phone #: Metta Clines [phone]  S Name/Age/Gender Eric Gill 68 y.o. male Room/Bed: ED51A/ED51A  Code Status   Code Status: Full Code  Home/SNF/Other Home Patient oriented to: self, place, time and situation Is this baseline? Yes   Triage Complete: Triage complete  Chief Complaint Urinary frequency  Triage Note Pt presents to ED with urinary frequency and weakness today. Pt denies pain with urination. Hx of the same.    Allergies Allergies  Allergen Reactions  . Losartan Cough  . Statins Nausea And Vomiting and Other (See Comments)    Reaction:  Muscle pain     Level of Care/Admitting Diagnosis ED Disposition    ED Disposition Condition Comment   Admit  Hospital Area: [place] [[id number]]  Level of Care: Med-Surg [16]  Covid Evaluation: Confirmed COVID Negative  Diagnosis: Renal failure (ARF), acute on chronic [facility]) [id number]  Admitting Physician: Demetrios Loll [phone]  Attending Physician: Demetrios Loll [phone]  Estimated length of stay: past midnight tomorrow  Certification:: I certify this patient will need inpatient services for at least 2 midnights  PT Class (Do Not Modify): Inpatient [101]  PT Acc Code (Do Not Modify): Private [1]       B Medical/Surgery History Past Medical History:  Diagnosis Date  . Anemia   . Antiphospholipid antibody syndrome ([place])   . Arthritis   . DVT (deep venous thrombosis) ([place])   . Essential hypertension   . Gastric ulcer   . GERD (gastroesophageal reflux disease)   . History of CMV   . Hyperlipidemia   . Hyperparathyroidism ([place])   . Pedal edema   . Peripheral vascular disease ([place])   . Rosacea   . Status post kidney transplant 2001  . Stroke ([place])   . TIA (transient ischemic attack)   . Vascular dementia without behavioral disturbance [facility])    Past Surgical History:  Procedure Laterality Date  . CHOLECYSTECTOMY    . COLONOSCOPY WITH PROPOFOL  N/A 03/06/2015   Procedure: COLONOSCOPY WITH PROPOFOL;  Surgeon: Josefine Class, MD;  Location[facility] ENDOSCOPY;  Service: Endoscopy;  Laterality: N/A;  . ELBOW SURGERY    . ESOPHAGOGASTRODUODENOSCOPY Left 03/01/2015   Procedure: ESOPHAGOGASTRODUODENOSCOPY (EGD);  Surgeon: Hulen Luster, MD;  Location: [facility] ENDOSCOPY;  Service: Endoscopy;  Laterality: Left;  . ESOPHAGOGASTRODUODENOSCOPY (EGD) WITH PROPOFOL  03/06/2015   Procedure: ESOPHAGOGASTRODUODENOSCOPY (EGD) WITH PROPOFOL;  Surgeon: Josefine Class, MD;  Location: [facility] ENDOSCOPY;  Service: Endoscopy;;  . ESOPHAGOGASTRODUODENOSCOPY (EGD) WITH PROPOFOL N/A 05/25/2015   Procedure: ESOPHAGOGASTRODUODENOSCOPY (EGD) WITH PROPOFOL;  Surgeon: Josefine Class, MD;  Location: [facility] ENDOSCOPY;  Service: Endoscopy;  Laterality: N/A;  . KIDNEY TRANSPLANT    . KNEE ARTHROSCOPY       A IV Location/Drains/Wounds Patient Lines/Drains/Airways Status   Active Line/Drains/Airways    Name:   Placement date:   Placement time:   Site:   Days:   Peripheral IV 10/16/18 Left Forearm   10/16/18    0300    Forearm   less than 1          Intake/Output Last 24 hours  Intake/Output Summary (Last 24 hours) at 10/16/2018 1458 Last data filed at 10/16/2018 1357 Gross per 24 hour  Intake 1200 ml  Output 600 ml  Net 600 ml    Labs/Imaging Results for orders placed or performed during the hospital encounter of 10/16/18 (from the past 48 hour(s))  Basic metabolic panel  Status: Abnormal   Collection Time: 10/15/18 11:24 PM  Result Value Ref Range   Sodium 141 135 - 145 mmol/L   Potassium 3.9 3.5 - 5.1 mmol/L   Chloride 112 (H) 98 - 111 mmol/L   CO2 21 (L) 22 - 32 mmol/L   Glucose, Bld 107 (H) 70 - 99 mg/dL   BUN 33 (H) 8 - 23 mg/dL   Creatinine, Ser 3.51 (H) 0.61 - 1.24 mg/dL   Calcium 9.4 8.9 - 10.3 mg/dL   GFR calc non Af Amer 17 (L) >60 mL/min   GFR calc Af Amer 19 (L) >60 mL/min   Anion gap 8 5 - 15    Comment: Performed at [facility], [address] [place], [place] [address]  CBC     Status: Abnormal   Collection Time: 10/15/18 11:24 PM  Result Value Ref Range   WBC 8.7 4.0 - 10.5 K/uL   RBC 3.96 (L) 4.22 - 5.81 MIL/uL   Hemoglobin 11.5 (L) 13.0 - 17.0 g/dL   HCT 36.8 (L) 39.0 - 52.0 %   MCV 92.9 80.0 - 100.0 fL   MCH 29.0 26.0 - 34.0 pg   MCHC 31.3 30.0 - 36.0 g/dL   RDW 13.9 11.5 - 15.5 %   Platelets 146 (L) 150 - 400 K/uL   nRBC 0.0 0.0 - 0.2 %    Comment: Performed at [facility], [place], [place] [address]  Troponin I (High Sensitivity)     Status: Abnormal   Collection Time: 10/15/18 11:29 PM  Result Value Ref Range   Troponin I (High Sensitivity) 22 (H) <18 ng/L    Comment: (NOTE) Elevated high sensitivity troponin I (hsTnI) values and significant  changes across serial measurements may suggest ACS but many other  chronic and acute conditions are known to elevate hsTnI results.  Refer to the "Links" section for chest pain algorithms and additional  guidance. Performed at [facility], [address] [place], [place] [address]   Urinalysis, Complete w Microscopic     Status: Abnormal   Collection Time: 10/16/18  2:26 AM  Result Value Ref Range   Color, Urine YELLOW (A) YELLOW   APPearance HAZY (A) CLEAR   Specific Gravity, Urine 1.011 1.005 - 1.030   pH 5.0 5.0 - 8.0   Glucose, UA NEGATIVE NEGATIVE mg/dL   Hgb urine dipstick MODERATE (A) NEGATIVE   Bilirubin Urine NEGATIVE NEGATIVE   Ketones, ur NEGATIVE NEGATIVE mg/dL   Protein, ur 30 (A) NEGATIVE mg/dL   Nitrite NEGATIVE NEGATIVE   Leukocytes,Ua MODERATE (A) NEGATIVE   RBC / HPF >50 (H) 0 - 5 RBC/hpf   WBC, UA >50 (H) 0 - 5 WBC/hpf   Bacteria, UA RARE (A) NONE SEEN   Squamous Epithelial / LPF 0-5 0 - 5   Mucus PRESENT     Comment: Performed at [facility], [address] [place], [place] [address]  Lactic acid, plasma     Status: None   Collection Time: 10/16/18  2:26 AM  Result  Value Ref Range   Lactic Acid, Venous 0.9 0.5 - 1.9 mmol/L    Comment: Performed at [facility], [address] [place], [place] [address]  Blood culture (routine x 2)     Status: None (Preliminary result)   Collection Time: 10/16/18  2:26 AM   Specimen: BLOOD  Result Value Ref Range   Specimen Description BLOOD LEFT FA    Special Requests  BOTTLES DRAWN AEROBIC AND ANAEROBIC Blood Culture adequate volume   Culture      NO GROWTH < 12 HOURS Performed at [facility], [address] [place], [place] [address]    Report Status PENDING   Blood culture (routine x 2)     Status: None (Preliminary result)   Collection Time: 10/16/18  2:26 AM   Specimen: BLOOD  Result Value Ref Range   Specimen Description BLOOD LEFT FA    Special Requests      BOTTLES DRAWN AEROBIC AND ANAEROBIC Blood Culture adequate volume   Culture      NO GROWTH < 12 HOURS Performed at [facility], [address], [place], [place] [address]    Report Status PENDING   SARS Coronavirus 2 [facility] order, Performed in [facility] hospital lab) Nasopharyngeal Nasopharyngeal Swab     Status: None   Collection Time: 10/16/18  2:26 AM   Specimen: Nasopharyngeal Swab  Result Value Ref Range   SARS Coronavirus 2 NEGATIVE NEGATIVE    Comment: (NOTE) If result is NEGATIVE SARS-CoV-2 target nucleic acids are NOT DETECTED. The SARS-CoV-2 RNA is generally detectable in upper and lower  respiratory specimens during the acute phase of infection. The lowest  concentration of SARS-CoV-2 viral copies this assay can detect is 250  copies / mL. A negative result does not preclude SARS-CoV-2 infection  and should not be used as the sole basis for treatment or other  patient management decisions.  A negative result may occur with  improper specimen collection / handling, submission of specimen other  than nasopharyngeal swab, presence of viral mutation(s) within the  areas targeted by this  assay, and inadequate number of viral copies  (<250 copies / mL). A negative result must be combined with clinical  observations, patient history, and epidemiological information. If result is POSITIVE SARS-CoV-2 target nucleic acids are DETECTED. The SARS-CoV-2 RNA is generally detectable in upper and lower  respiratory specimens dur ing the acute phase of infection.  Positive  results are indicative of active infection with SARS-CoV-2.  Clinical  correlation with patient history and other diagnostic information is  necessary to determine patient infection status.  Positive results do  not rule out bacterial infection or co-infection with other viruses. If result is PRESUMPTIVE POSTIVE SARS-CoV-2 nucleic acids MAY BE PRESENT.   A presumptive positive result was obtained on the submitted specimen  and confirmed on repeat testing.  While 2019 novel coronavirus  (SARS-CoV-2) nucleic acids may be present in the submitted sample  additional confirmatory testing may be necessary for epidemiological  and / or clinical management purposes  to differentiate between  SARS-CoV-2 and other Sarbecovirus currently known to infect humans.  If clinically indicated additional testing with an alternate test  methodology [phone]) is advised. The SARS-CoV-2 RNA is generally  detectable in upper and lower respiratory sp ecimens during the acute  phase of infection. The expected result is Negative. Fact Sheet for Patients:  StrictlyIdeas.no Fact Sheet for Healthcare Providers: BankingDealers.co.za This test is not yet approved or cleared by the [place] FDA and has been authorized for detection and/or diagnosis of SARS-CoV-2 by FDA under an Emergency Use Authorization (EUA).  This EUA will remain in effect (meaning this test can be used) for the duration of the COVID-19 declaration under Section 564(b)(1) of the Act, 21 U.S.C. section 360bbb-3(b)(1),  unless the authorization is terminated or revoked sooner. Performed at [facility], [address] [place],  [address]  Protime-INR     Status: Abnormal   Collection Time: 10/16/18  2:26 AM  Result Value Ref Range   Prothrombin Time 25.8 (H) 11.4 - 15.2 seconds   INR 2.4 (H) 0.8 - 1.2    Comment: (NOTE) INR goal varies based on device and disease states. Performed at [facility], [address] [place], [place] [address]    Dg Foot Complete Right  Result Date: 10/16/2018 CLINICAL DATA:  Fall, foot pain and bruising EXAM: RIGHT FOOT COMPLETE - 3+ VIEW COMPARISON:  None. FINDINGS: No fracture or dislocation. There is diffuse osteopenia. Surgical clips seen overlying the medial aspect of the ankle. Scattered dense vascular calcifications. IMPRESSION: No acute osseous abnormality. Electronically Signed   By: Prudencio Pair M.D.   On: 10/16/2018 02:32    Pending Labs Unresulted Labs (From admission, onward)    Start     Ordered   10/17/18 [id number]  Basic metabolic panel  Tomorrow morning,   STAT     10/16/18 1136   10/17/18 0500  CBC  Tomorrow morning,   STAT     10/16/18 1136   10/17/18 0500  Protime-INR  Daily,   STAT     10/16/18 0800   10/16/18 0212  Urine culture  ONCE - STAT,   STAT     10/16/18 0211   10/16/18 0212  Tacrolimus level  Once,   STAT     10/16/18 0211          Vitals/Pain Today's Vitals   10/16/18 1130 10/16/18 1218 10/16/18 1330 10/16/18 1438  BP: (!) 177/92 (!) 162/82    Pulse: (!) 102 96    Resp: 20     Temp:   (!) 100.5 F (38.1 C)   TempSrc:   Axillary   SpO2: 93%     Weight:      Height:      PainSc:    0-No pain    Isolation Precautions No active isolations  Medications Medications  acetaminophen (TYLENOL) tablet 650 mg ( Oral See Alternative 10/16/18 1339)    Or  acetaminophen (TYLENOL) suppository 650 mg (650 mg Rectal Given 10/16/18 1339)  ondansetron (ZOFRAN) tablet 4 mg ( Oral See Alternative 10/16/18 1339)     Or  ondansetron (ZOFRAN) injection 4 mg (4 mg Intravenous Given 10/16/18 1339)  albuterol (PROVENTIL) (2.5 MG/3ML) 0.083% nebulizer solution 2.5 mg (has no administration in time range)  0.9 %  sodium chloride infusion ( Intravenous New Bag/Given 10/16/18 1218)  HYDROcodone-acetaminophen (NORCO/VICODIN) 5-325 MG per tablet 1-2 tablet (has no administration in time range)  bisacodyl (DULCOLAX) EC tablet 5 mg (has no administration in time range)  sodium phosphate (FLEET) 7-19 GM/118ML enema 1 enema (has no administration in time range)  amLODipine (NORVASC) tablet 10 mg (10 mg Oral Given 10/16/18 1220)  labetalol (NORMODYNE) tablet 200 mg (200 mg Oral Given 10/16/18 1218)  buPROPion (WELLBUTRIN XL) 24 hr tablet 300 mg (300 mg Oral Given 10/16/18 1220)  citalopram (CELEXA) tablet 20 mg (20 mg Oral Given 10/16/18 1220)  donepezil (ARICEPT) tablet 10 mg (has no administration in time range)  calcitRIOL (ROCALTROL) capsule 0.25 mcg (has no administration in time range)  dicyclomine (BENTYL) capsule 10 mg (10 mg Oral Given 10/16/18 1220)  diphenoxylate-atropine (LOMOTIL) 2.5-0.025 MG per tablet 1 tablet (has no administration in time range)  magnesium oxide (MAG-OX) tablet 400 mg (400 mg Oral Given 10/16/18 1220)  pantoprazole (PROTONIX) EC tablet 40 mg (40 mg Oral Given 10/16/18 1220)  tacrolimus (PROGRAF) capsule 0.5 mg (0.5 mg Oral Given 10/16/18 1219)  mometasone-formoterol (DULERA) 100-5 MCG/ACT inhaler 2 puff (has no administration in time range)  hydrALAZINE (APRESOLINE) injection 10 mg (10 mg Intravenous Given 10/16/18 1017)  ceFEPIme (MAXIPIME) 1 g in sodium chloride 0.9 % 100 mL IVPB (has no administration in time range)  warfarin (COUMADIN) tablet 2 mg (has no administration in time range)  Warfarin - Pharmacist Dosing Inpatient (has no administration in time range)  mycophenolate (CELLCEPT) capsule 250 mg (250 mg Oral Given 10/16/18 1220)    And  mycophenolate (CELLCEPT) capsule 500 mg (has no administration  in time range)  sodium chloride 0.9 % bolus 1,000 mL (0 mLs Intravenous Stopped 10/16/18 0452)  0.9 %  sodium chloride infusion ( Intravenous Stopped 10/16/18 1217)  ceFEPIme (MAXIPIME) 2 g in sodium chloride 0.9 % 100 mL IVPB (0 g Intravenous Stopped 10/16/18 0352)  bupivacaine (MARCAINE) 0.5 % injection 10 mL (10 mLs Infiltration Given 10/16/18 0329)    Mobility walks Low fall risk   Focused Assessments Urinary frequency; weakness; HTN   R Recommendations: See Admitting Provider Note  Report given to:   Additional Notes:    Condom cath in place; incontinent of stool

## 2018-10-16 NOTE — Plan of Care (Signed)
  Problem: Activity: Goal: Risk for activity intolerance will decrease Outcome: Progressing   Problem: Safety: Goal: Ability to remain free from injury will improve Outcome: Progressing   

## 2018-10-17 ENCOUNTER — Inpatient Hospital Stay: Payer: Medicare Other

## 2018-10-17 DIAGNOSIS — N390 Urinary tract infection, site not specified: Secondary | ICD-10-CM

## 2018-10-17 DIAGNOSIS — B961 Klebsiella pneumoniae [K. pneumoniae] as the cause of diseases classified elsewhere: Secondary | ICD-10-CM

## 2018-10-17 DIAGNOSIS — R7881 Bacteremia: Secondary | ICD-10-CM

## 2018-10-17 DIAGNOSIS — Z87442 Personal history of urinary calculi: Secondary | ICD-10-CM

## 2018-10-17 DIAGNOSIS — Z87440 Personal history of urinary (tract) infections: Secondary | ICD-10-CM

## 2018-10-17 DIAGNOSIS — H5347 Heteronymous bilateral field defects: Secondary | ICD-10-CM

## 2018-10-17 DIAGNOSIS — D6861 Antiphospholipid syndrome: Secondary | ICD-10-CM

## 2018-10-17 DIAGNOSIS — Z8673 Personal history of transient ischemic attack (TIA), and cerebral infarction without residual deficits: Secondary | ICD-10-CM

## 2018-10-17 DIAGNOSIS — Z7901 Long term (current) use of anticoagulants: Secondary | ICD-10-CM

## 2018-10-17 DIAGNOSIS — Z86718 Personal history of other venous thrombosis and embolism: Secondary | ICD-10-CM

## 2018-10-17 DIAGNOSIS — Z940 Kidney transplant status: Secondary | ICD-10-CM

## 2018-10-17 DIAGNOSIS — Z888 Allergy status to other drugs, medicaments and biological substances status: Secondary | ICD-10-CM

## 2018-10-17 DIAGNOSIS — N183 Chronic kidney disease, stage 3 (moderate): Secondary | ICD-10-CM

## 2018-10-17 DIAGNOSIS — Z7952 Long term (current) use of systemic steroids: Secondary | ICD-10-CM

## 2018-10-17 LAB — URINE CULTURE: Culture: NO GROWTH

## 2018-10-17 LAB — CBC
HCT: 32.1 % — ABNORMAL LOW (ref 39.0–52.0)
Hemoglobin: 9.6 g/dL — ABNORMAL LOW (ref 13.0–17.0)
MCH: 28.3 pg (ref 26.0–34.0)
MCHC: 29.9 g/dL — ABNORMAL LOW (ref 30.0–36.0)
MCV: 94.7 fL (ref 80.0–100.0)
Platelets: 108 10*3/uL — ABNORMAL LOW (ref 150–400)
RBC: 3.39 MIL/uL — ABNORMAL LOW (ref 4.22–5.81)
RDW: 14 % (ref 11.5–15.5)
WBC: 11.2 10*3/uL — ABNORMAL HIGH (ref 4.0–10.5)
nRBC: 0 % (ref 0.0–0.2)

## 2018-10-17 LAB — BASIC METABOLIC PANEL
Anion gap: 7 (ref 5–15)
BUN: 38 mg/dL — ABNORMAL HIGH (ref 8–23)
CO2: 19 mmol/L — ABNORMAL LOW (ref 22–32)
Calcium: 8.4 mg/dL — ABNORMAL LOW (ref 8.9–10.3)
Chloride: 114 mmol/L — ABNORMAL HIGH (ref 98–111)
Creatinine, Ser: 3.71 mg/dL — ABNORMAL HIGH (ref 0.61–1.24)
GFR calc Af Amer: 18 mL/min — ABNORMAL LOW (ref >60)
GFR calc non Af Amer: 16 mL/min — ABNORMAL LOW (ref >60)
Glucose, Bld: 92 mg/dL (ref 70–99)
Potassium: 3.5 mmol/L (ref 3.5–5.1)
Sodium: 140 mmol/L (ref 135–145)

## 2018-10-17 LAB — PROTIME-INR
INR: 2.5 — ABNORMAL HIGH (ref 0.8–1.2)
Prothrombin Time: 26.9 seconds — ABNORMAL HIGH (ref 11.4–15.2)

## 2018-10-17 MED ORDER — SODIUM CHLORIDE 0.9 % IV SOLN
2.0000 g | Freq: Every day | INTRAVENOUS | Status: DC
  Administered 2018-10-17 – 2018-10-18 (×2): 2 g via INTRAVENOUS
  Filled 2018-10-17 (×3): qty 2

## 2018-10-17 MED ORDER — WARFARIN SODIUM 3 MG PO TABS
3.0000 mg | ORAL_TABLET | Freq: Once | ORAL | Status: AC
  Administered 2018-10-17: 3 mg via ORAL
  Filled 2018-10-17: qty 1

## 2018-10-17 NOTE — Consult Note (Signed)
NAME: Eric Gill  DOB: [birth year]  MRN: [id number]  Date/Time: 10/17/2018 11:01 AM  REQUESTING PROVIDER Dr. Bridgett Larsson Subjective:  REASON FOR CONSULT: UTI and bacteremia ?History from chart and spoke to wife.  Patient not a reliable historian Eric Gill is a 68 y.o. 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  He had noted some lower suprapubic abdominal pain along with sensation of burning with urination 24 hours before.  He then developed fatigue and nausea .this was followed by uncontrollable Rigor and temperature went up to 101 at home.  Before he came to the hospital he fell and stubbed his toes of his right foot.  In the ED his vitals where 99.2, blood pressure 180/86, respiratory rate of 18, and pulse of 86. Blood culture and urine culture was sent.  He was started on IV cefepime . Blood culture has come back positive for Klebsiella.  But urine culture is negative.  But UA shows more than 50 WBC.  I am asked to see the patient for the bacteremia.  Patient had a kidney transplant in 2001 at [facility].Eric Gill  His wife is the  donor.  In 2018 he had a urinary tract infection with Klebsiella and was present in blood as well as urine.  It was a pansensitive Klebsiella.  He was admitted to [facility] between 10/18/2016 to 10/23/2016.  He was initially treated with IV antibiotics and then discharged on p.o. Keflex.  Quinolone was avoided because of QTC prolonging medication and him being on warfarin .  He was readmitted in October 2018 with recurrent UTI due to Klebsiella with allograft pyelo and cystitis. The Klebsiella this time was resistant to ceftriaxone.   He responded well to Bactrim during that episode.  He also had a stone in his kidney.  He was followed by transplant ID Dr. Ralene Ok at [facility]  Past medical history Antiphospholipid antibody  syndrome DVT Pyelonephritis of transplanted kidney Hypertension Renal transplant from living related donor Iron deficiency anemia due to chronic blood loss CKD CVA Vascular dementia Gastric ulcer Acute cholecystitis due to gallstones Arthritis Rosacea Hyperparathyroidism Hyperlipidemia CMV colitis  Past surgical history Kidney transplant 2001 Repair insert biceps tendon at the elbow Quadriceps muscle repair Inguinal hernia repair right Multiple EGD and colonoscopy Knee arthroscopy   Social history  Former smoker No alcohol  Family history Coronary artery disease father High blood pressure father Hypertension mother Kidney disease mother ALS paternal grandfather Cancer maternal grandmother Allergies  Allergen Reactions  . Losartan Cough  . Statins Nausea And Vomiting and Other (See Comments)    Reaction:  Muscle pain     ? Current Facility-Administered Medications  Medication Dose Route Frequency Provider Last Rate Last Dose  . 0.9 %  sodium chloride infusion   Intravenous Continuous Demetrios Loll, MD 75 mL/hr at 10/16/18 2230    . acetaminophen (TYLENOL) tablet 650 mg  650 mg Oral Q6H PRN Demetrios Loll, MD       Or  . acetaminophen (TYLENOL) suppository 650 mg  650 mg Rectal Q6H PRN Demetrios Loll, MD   650 mg at 10/16/18 1339  . albuterol (PROVENTIL) (2.5 MG/3ML) 0.083% nebulizer solution 2.5 mg  2.5 mg Nebulization Q2H PRN Demetrios Loll, MD      . amLODipine (NORVASC) tablet 10 mg  10 mg Oral Daily Demetrios Loll, MD   10 mg at 10/17/18 0939  . bisacodyl (DULCOLAX)  EC tablet 5 mg  5 mg Oral Daily PRN Demetrios Loll, MD      . buPROPion (WELLBUTRIN XL) 24 hr tablet 300 mg  300 mg Oral Daily Demetrios Loll, MD   300 mg at 10/17/18 [id number]  . calcitRIOL (ROCALTROL) capsule 0.25 mcg  0.25 mcg Oral QHS Demetrios Loll, MD   0.25 mcg at 10/16/18 2236  . cefTRIAXone (ROCEPHIN) 2 g in sodium chloride 0.9 % 100 mL IVPB  2 g Intravenous Q24H Demetrios Loll, MD 200 mL/hr at 10/16/18 2236 2 g at 10/16/18  2236  . citalopram (CELEXA) tablet 20 mg  20 mg Oral Daily Demetrios Loll, MD   20 mg at 10/17/18 0939  . dicyclomine (BENTYL) capsule 10 mg  10 mg Oral TID Otho Perl, MD   10 mg at 10/17/18 0939  . diphenoxylate-atropine (LOMOTIL) 2.5-0.025 MG per tablet 1 tablet  1 tablet Oral QID PRN Demetrios Loll, MD      . donepezil (ARICEPT) tablet 10 mg  10 mg Oral QHS Demetrios Loll, MD   10 mg at 10/16/18 2236  . hydrALAZINE (APRESOLINE) injection 10 mg  10 mg Intravenous Q6H PRN Demetrios Loll, MD   10 mg at 10/16/18 1017  . HYDROcodone-acetaminophen (NORCO/VICODIN) 5-325 MG per tablet 1-2 tablet  1-2 tablet Oral Q4H PRN Demetrios Loll, MD      . labetalol (NORMODYNE) tablet 200 mg  200 mg Oral BID Demetrios Loll, MD   200 mg at 10/17/18 0939  . magnesium oxide (MAG-OX) tablet 400 mg  400 mg Oral BID Demetrios Loll, MD   400 mg at 10/17/18 0939  . mometasone-formoterol (DULERA) 100-5 MCG/ACT inhaler 2 puff  2 puff Inhalation BID Demetrios Loll, MD   2 puff at 10/17/18 0940  . mycophenolate (CELLCEPT) capsule 250 mg  250 mg Oral q morning - 10a Hallaji, Dani Gobble, RPH   250 mg at 10/16/18 1220   And  . mycophenolate (CELLCEPT) capsule 500 mg  500 mg Oral QHS Hallaji, Sheema M, RPH   500 mg at 10/16/18 2351  . ondansetron (ZOFRAN) tablet 4 mg  4 mg Oral Q6H PRN Demetrios Loll, MD       Or  . ondansetron [facility]) injection 4 mg  4 mg Intravenous Q6H PRN Demetrios Loll, MD   4 mg at 10/16/18 1339  . pantoprazole (PROTONIX) EC tablet 40 mg  40 mg Oral Daily Demetrios Loll, MD   40 mg at 10/17/18 0939  . sodium phosphate (FLEET) 7-19 GM/118ML enema 1 enema  1 enema Rectal Once PRN Demetrios Loll, MD      . tacrolimus (PROGRAF) capsule 0.5 mg  0.5 mg Oral BID Demetrios Loll, MD   0.5 mg at 10/17/18 0939  . Warfarin - Pharmacist Dosing Inpatient   Does not apply q1800 Pernell Dupre, RPH         Abtx:  Anti-infectives (From admission, onward)   Start     Dose/Rate Route Frequency Ordered Stop   10/17/18 0600  ceFEPIme (MAXIPIME) 1 g in sodium  chloride 0.9 % 100 mL IVPB  Status:  Discontinued     1 g 200 mL/hr over 30 Minutes Intravenous Every 24 hours 10/16/18 0755 10/16/18 1819   10/16/18 2000  cefTRIAXone (ROCEPHIN) 2 g in sodium chloride 0.9 % 100 mL IVPB     2 g 200 mL/hr over 30 Minutes Intravenous Every 24 hours 10/16/18 1819     10/16/18 0230  ceFEPIme (MAXIPIME) 2 g in sodium chloride  0.9 % 100 mL IVPB     2 g 200 mL/hr over 30 Minutes Intravenous  Once 10/16/18 0226 10/16/18 0352      REVIEW OF SYSTEMS:  Const:  fever, chills, negative weight loss Eyes: negative diplopia  Has b/l hemianopsia, negative eye pain ENT: negative coryza, negative sore throat Resp: negative cough, hemoptysis, dyspnea Cards: negative for chest pain, palpitations, lower extremity edema GU:  frequency, dysuria ,no hematuria, c/o left flank pain has baseline creatinine of 3.2 GI: lower abdominal pain, diarrhea, vomiting Skin: negative for rash and pruritus Heme: easy bruising    [id number], arthralgias, back pain and muscle weakness Neurolo:negative for headaches, has dizziness, vertigo, has memory problems  Psych: negative for feelings of anxiety, depression  Endocrine: negative for thyroid, diabetes Allergy/Immunology- negative for any medication or food allergies ? Pertinent Positives include : Objective:  VITALS:  BP 139/64 (BP Location: Left Arm)   Pulse 74   Temp 98.4 F (36.9 C) (Oral)   Resp 17   Ht 5\' 3"  (1.6 m)   Wt 58.1 kg   SpO2 95%   BMI 22.67 kg/m  PHYSICAL EXAM:  General: Alert, cooperative, no distress, chronically ill looking ,thin Head: Normocephalic, without obvious abnormality, atraumatic. Eyes: Conjunctivae clear, anicteric sclerae. Pupils are equal ENT Nares normal. No drainage or sinus tenderness. Lips, mucosa, and tongue normal. No Thrush Neck: Supple, symmetrical, no adenopathy, thyroid: non tender no carotid bruit and no JVD. Back: No CVA tenderness. Lungs: b/l air entry Heart: s1s2 Abdomen:  Soft, non-tender,not distended. Scar over lower abdomen. Bowel sounds normal. No masses Extremities: tenderness 2rd and 3rd toe of rt foot -taped together Skin: skin thin and excessive bruising  Lymph: Cervical, supraclavicular normal. Neurologic: Grossly non-focal Pertinent Labs Lab Results CBC    Component Value Date/Time   WBC 11.2 (H) 10/17/2018 0328   RBC 3.39 (L) 10/17/2018 0328   HGB 9.6 (L) 10/17/2018 0328   HCT 32.1 (L) 10/17/2018 0328   PLT 108 (L) 10/17/2018 0328   MCV 94.7 10/17/2018 0328   MCH 28.3 10/17/2018 0328   MCHC 29.9 (L) 10/17/2018 0328   RDW 14.0 10/17/2018 0328   LYMPHSABS 1.2 11/13/2016 0033   MONOABS 0.8 11/13/2016 0033   EOSABS 0.0 11/13/2016 0033   BASOSABS 0.0 11/13/2016 0033    CMP Latest Ref Rng & Units 10/17/2018 10/15/2018 08/14/2018  Glucose 70 - 99 mg/dL 92 107(H) 86  BUN 8 - 23 mg/dL 38(H) 33(H) 31(H)  Creatinine 0.61 - 1.24 mg/dL 3.71(H) 3.51(H) 3.10(H)  Sodium 135 - 145 mmol/L 140 141 142  Potassium 3.5 - 5.1 mmol/L 3.5 3.9 3.5  Chloride 98 - 111 mmol/L 114(H) 112(H) 112(H)  CO2 22 - 32 mmol/L 19(L) 21(L) 22  Calcium 8.9 - 10.3 mg/dL 8.4(L) 9.4 9.2  Total Protein 6.5 - 8.1 g/dL - - -  Total Bilirubin 0.3 - 1.2 mg/dL - - -  Alkaline Phos 38 - 126 U/L - - -  AST 15 - 41 U/L - - -  ALT 0 - 44 U/L - - -      Microbiology: Recent Results (from the past 240 hour(s))  Urine culture     Status: None   Collection Time: 10/16/18  2:26 AM   Specimen: Urine, Random  Result Value Ref Range Status   Specimen Description   Final    URINE, RANDOM Performed at [facility], [address], [place], [place] [address]    Special Requests   Final  NONE Performed at [facility], [address] [place], [place] [address]    Culture   Final    NO GROWTH Performed at [facility], [place] [address] [place], [place] [address]    Report Status 10/17/2018 FINAL  Final  Blood culture (routine x 2)     Status: None  (Preliminary result)   Collection Time: 10/16/18  2:26 AM   Specimen: BLOOD  Result Value Ref Range Status   Specimen Description BLOOD LEFT FA  Final   Special Requests   Final    BOTTLES DRAWN AEROBIC AND ANAEROBIC Blood Culture adequate volume   Culture   Final    NO GROWTH 1 DAY Performed at [facility], [address] [place], [place] [address]    Report Status PENDING  Incomplete  Blood culture (routine x 2)     Status: Abnormal (Preliminary result)   Collection Time: 10/16/18  2:26 AM   Specimen: BLOOD  Result Value Ref Range Status   Specimen Description   Final    BLOOD LEFT FA Performed at [facility], [address] [place], [place] [address]    Special Requests   Final    BOTTLES DRAWN AEROBIC AND ANAEROBIC Blood Culture adequate volume Performed at [facility], [address] [place], [place] [address]    Culture  Setup Time   Final    ANAEROBIC BOTTLE ONLY GRAM NEGATIVE RODS CRITICAL RESULT CALLED TO, READ BACK BY AND VERIFIED WITH: ELLINGON,A AT [id number] ON 10/16/2018 BY JPM    Culture (A)  Final    KLEBSIELLA OXYTOCA SUSCEPTIBILITIES TO FOLLOW Performed at [facility], [place] [address] [place], [place] [address]    Report Status PENDING  Incomplete  SARS Coronavirus 2 [facility] order, Performed in [facility] hospital lab) Nasopharyngeal Nasopharyngeal Swab     Status: None   Collection Time: 10/16/18  2:26 AM   Specimen: Nasopharyngeal Swab  Result Value Ref Range Status   SARS Coronavirus 2 NEGATIVE NEGATIVE Final    Comment: (NOTE) If result is NEGATIVE SARS-CoV-2 target nucleic acids are NOT DETECTED. The SARS-CoV-2 RNA is generally detectable in upper and lower  respiratory specimens during the acute phase of infection. The lowest  concentration of SARS-CoV-2 viral copies this assay can detect is 250  copies / mL. A negative result does not preclude SARS-CoV-2 infection  and should not be used as the sole basis  for treatment or other  patient management decisions.  A negative result may occur with  improper specimen collection / handling, submission of specimen other  than nasopharyngeal swab, presence of viral mutation(s) within the  areas targeted by this assay, and inadequate number of viral copies  (<250 copies / mL). A negative result must be combined with clinical  observations, patient history, and epidemiological information. If result is POSITIVE SARS-CoV-2 target nucleic acids are DETECTED. The SARS-CoV-2 RNA is generally detectable in upper and lower  respiratory specimens dur ing the acute phase of infection.  Positive  results are indicative of active infection with SARS-CoV-2.  Clinical  correlation with patient history and other diagnostic information is  necessary to determine patient infection status.  Positive results do  not rule out bacterial infection or co-infection with other viruses. If result is PRESUMPTIVE POSTIVE SARS-CoV-2 nucleic acids MAY BE PRESENT.   A presumptive positive result was obtained on the submitted specimen  and confirmed on repeat testing.  While 2019 novel coronavirus  (SARS-CoV-2)  nucleic acids may be present in the submitted sample  additional confirmatory testing may be necessary for epidemiological  and / or clinical management purposes  to differentiate between  SARS-CoV-2 and other Sarbecovirus currently known to infect humans.  If clinically indicated additional testing with an alternate test  methodology [phone]) is advised. The SARS-CoV-2 RNA is generally  detectable in upper and lower respiratory sp ecimens during the acute  phase of infection. The expected result is Negative. Fact Sheet for Patients:  StrictlyIdeas.no Fact Sheet for Healthcare Providers: BankingDealers.co.za This test is not yet approved or cleared by the [place] FDA and has been authorized for detection and/or  diagnosis of SARS-CoV-2 by FDA under an Emergency Use Authorization (EUA).  This EUA will remain in effect (meaning this test can be used) for the duration of the COVID-19 declaration under Section 564(b)(1) of the Act, 21 U.S.C. section 360bbb-3(b)(1), unless the authorization is terminated or revoked sooner. Performed at [facility], [address] [place], [place] [address]   Blood Culture ID Panel (Reflexed)     Status: Abnormal   Collection Time: 10/16/18  2:26 AM  Result Value Ref Range Status   Enterococcus species NOT DETECTED NOT DETECTED Final   Listeria monocytogenes NOT DETECTED NOT DETECTED Final   Staphylococcus species NOT DETECTED NOT DETECTED Final   Staphylococcus aureus (BCID) NOT DETECTED NOT DETECTED Final   Streptococcus species NOT DETECTED NOT DETECTED Final   Streptococcus agalactiae NOT DETECTED NOT DETECTED Final   Streptococcus pneumoniae NOT DETECTED NOT DETECTED Final   Streptococcus pyogenes NOT DETECTED NOT DETECTED Final   Acinetobacter baumannii NOT DETECTED NOT DETECTED Final   Enterobacteriaceae species DETECTED (A) NOT DETECTED Final    Comment: Enterobacteriaceae represent a large family of gram-negative bacteria, not a single organism. CRITICAL RESULT CALLED TO, READ BACK BY AND VERIFIED WITH: ELLINGTON,A AT [id number] ON 10/16/2018 BY JPM    Enterobacter cloacae complex NOT DETECTED NOT DETECTED Final   Escherichia coli NOT DETECTED NOT DETECTED Final   Klebsiella oxytoca DETECTED (A) NOT DETECTED Final    Comment: CRITICAL RESULT CALLED TO, READ BACK BY AND VERIFIED WITH: ELLINGTON,A AT 1747 ON 10/16/2018 BY JPM    Klebsiella pneumoniae NOT DETECTED NOT DETECTED Final   Proteus species NOT DETECTED NOT DETECTED Final   Serratia marcescens NOT DETECTED NOT DETECTED Final   Carbapenem resistance NOT DETECTED NOT DETECTED Final   Haemophilus influenzae NOT DETECTED NOT DETECTED Final   Neisseria meningitidis NOT DETECTED NOT DETECTED Final    Pseudomonas aeruginosa NOT DETECTED NOT DETECTED Final   Candida albicans NOT DETECTED NOT DETECTED Final   Candida glabrata NOT DETECTED NOT DETECTED Final   Candida krusei NOT DETECTED NOT DETECTED Final   Candida parapsilosis NOT DETECTED NOT DETECTED Final   Candida tropicalis NOT DETECTED NOT DETECTED Final    Comment: Performed at [facility], [address] [place], [place] [address]    IMAGING RESULTS: X-ray of the foot right no evidence of fracture or I have personally reviewed the films ? Impression/Recommendation 68 year old male with history of renal transplant on mycophenolate, tacrolimus and prednisone is admitted with sudden onset fever and chills and Reiger and few hours history of weakness abdominal pain frequent urination.  Klebsiella bacteremia: Even though urine culture is negative, the urine shows more than 50 WBC and urosepsis/ left kidney ( allograft)  pyelonephritis is the source for this Klebsiella. Currently on ceftriaxone- change to cefepime a 4th Gen cephalosporin  H/o klebsiella bacteremia  and urosepsis in 2018 ( the kleb which initially was pansensitive became resistant to ceftriaxone on keflex)  Recommend ultrasound of the kidneys to look for any hydronephrosis.  Also to check for prostate enlargement.  CKD with cr around 3 at baseline - is  worse with  infection and dehydration  Status post renal transplant for IgA nephropathy.  On mycophenolate tacrolimus and prednisone  Antiphospholipid antibody syndrome- with Tias and Strokes in the past- on coumadin -INR is 2.5-3 range wife was concerned about INR becoming subtherapeutic as happened before.  ? ? ___________________________________________________ Discussed with patient and his wife. Discussed with primary team Note:  This document was prepared using Dragon voice recognition software and may include unintentional dictation errors.

## 2018-10-17 NOTE — Progress Notes (Signed)
[facility]  ROUNDING NOTE   Subjective:   [name] admitted to [facility] on 10/16/2018 for Lower urinary tract infectious disease [N39.0] Sprain of interphalangeal joint of right lesser toe(s), init [S93.514A]  Patient found to have urinary tract infection and bacteremia. Klebsiella species. Placed on Cefepime.   Objective:  Vital signs in last 24 hours:  Temp:  [98.2 F (36.8 C)-100.5 F (38.1 C)] 98.4 F (36.9 C) (09/09 0749) Pulse Rate:  [66-96] 74 (09/09 0749) Resp:  [17-19] 17 (09/09 0749) BP: (128-162)/(59-82) 139/64 (09/09 0749) SpO2:  [94 %-96 %] 95 % (09/09 0749)  Weight change:  Filed Weights   10/15/18 2309  Weight: 58.1 kg    Intake/Output: I/O last 3 completed shifts: In: 1200 [I.V.:200; IV Piggyback:1000] Out: 1050 [Urine:1050]   Intake/Output this shift:  Total I/O In: 180 [P.O.:180] Out: -   Physical Exam: General: NAD,   Head: Normocephalic, atraumatic. Moist oral mucosal membranes  Eyes: Anicteric, PERRL  Neck: Supple, trachea midline  Lungs:  Clear to auscultation  Heart: Regular rate and rhythm  Abdomen:  Soft, nontender,   Extremities: no peripheral edema.  Neurologic: Nonfocal, moving all four extremities  Skin: No lesions   GU Condom catheter with yellow urine    Basic Metabolic Panel: Recent Labs  Lab 10/15/18 2324 10/17/18 0328  NA 141 140  K 3.9 3.5  CL 112* 114*  CO2 21* 19*  GLUCOSE 107* 92  BUN 33* 38*  CREATININE 3.51* 3.71*  CALCIUM 9.4 8.4*    Liver Function Tests: No results for input(s): AST, ALT, ALKPHOS, BILITOT, PROT, ALBUMIN in the last 168 hours. No results for input(s): LIPASE, AMYLASE in the last 168 hours. No results for input(s): AMMONIA in the last 168 hours.  CBC: Recent Labs  Lab 10/15/18 2324 10/17/18 0328  WBC 8.7 11.2*  HGB 11.5* 9.6*  HCT 36.8* 32.1*  MCV 92.9 94.7  PLT 146* 108*    Cardiac Enzymes: No results for input(s): CKTOTAL, CKMB, CKMBINDEX, TROPONINI in the  last 168 hours.  BNP: Invalid input(s): POCBNP  CBG: No results for input(s): GLUCAP in the last 168 hours.  Microbiology: Results for orders placed or performed during the hospital encounter of 10/16/18  Urine culture     Status: None   Collection Time: 10/16/18  2:26 AM   Specimen: Urine, Random  Result Value Ref Range Status   Specimen Description   Final    URINE, RANDOM Performed at [facility], [address] [place], [place] [address]    Special Requests   Final    NONE Performed at [facility], [address] [place], [place] [address]    Culture   Final    NO GROWTH Performed at [facility], [place] [address], [place], [place] [address]    Report Status 10/17/2018 FINAL  Final  Blood culture (routine x 2)     Status: None (Preliminary result)   Collection Time: 10/16/18  2:26 AM   Specimen: BLOOD  Result Value Ref Range Status   Specimen Description BLOOD LEFT FA  Final   Special Requests   Final    BOTTLES DRAWN AEROBIC AND ANAEROBIC Blood Culture adequate volume   Culture   Final    NO GROWTH 1 DAY Performed at [facility], [address] [place], [place] [address]    Report Status PENDING  Incomplete  Blood culture (routine x 2)     Status: Abnormal (Preliminary result)  Collection Time: 10/16/18  2:26 AM   Specimen: BLOOD  Result Value Ref Range Status   Specimen Description   Final    BLOOD LEFT FA Performed at [facility], [address], [place], [place] [address]    Special Requests   Final    BOTTLES DRAWN AEROBIC AND ANAEROBIC Blood Culture adequate volume Performed at [facility], [address] [place], [place] [address]    Culture  Setup Time   Final    ANAEROBIC BOTTLE ONLY GRAM NEGATIVE RODS CRITICAL RESULT CALLED TO, READ BACK BY AND VERIFIED WITH: ELLINGON,A AT [id number] ON 10/16/2018 BY JPM    Culture (A)  Final    KLEBSIELLA OXYTOCA SUSCEPTIBILITIES TO FOLLOW Performed at  [facility], [place] [address], [place], [place] [address]    Report Status PENDING  Incomplete  SARS Coronavirus 2 [facility] order, Performed in [facility] hospital lab) Nasopharyngeal Nasopharyngeal Swab     Status: None   Collection Time: 10/16/18  2:26 AM   Specimen: Nasopharyngeal Swab  Result Value Ref Range Status   SARS Coronavirus 2 NEGATIVE NEGATIVE Final    Comment: (NOTE) If result is NEGATIVE SARS-CoV-2 target nucleic acids are NOT DETECTED. The SARS-CoV-2 RNA is generally detectable in upper and lower  respiratory specimens during the acute phase of infection. The lowest  concentration of SARS-CoV-2 viral copies this assay can detect is 250  copies / mL. A negative result does not preclude SARS-CoV-2 infection  and should not be used as the sole basis for treatment or other  patient management decisions.  A negative result may occur with  improper specimen collection / handling, submission of specimen other  than nasopharyngeal swab, presence of viral mutation(s) within the  areas targeted by this assay, and inadequate number of viral copies  (<250 copies / mL). A negative result must be combined with clinical  observations, patient history, and epidemiological information. If result is POSITIVE SARS-CoV-2 target nucleic acids are DETECTED. The SARS-CoV-2 RNA is generally detectable in upper and lower  respiratory specimens dur ing the acute phase of infection.  Positive  results are indicative of active infection with SARS-CoV-2.  Clinical  correlation with patient history and other diagnostic information is  necessary to determine patient infection status.  Positive results do  not rule out bacterial infection or co-infection with other viruses. If result is PRESUMPTIVE POSTIVE SARS-CoV-2 nucleic acids MAY BE PRESENT.   A presumptive positive result was obtained on the submitted specimen  and confirmed on repeat testing.  While 2019 novel coronavirus   (SARS-CoV-2) nucleic acids may be present in the submitted sample  additional confirmatory testing may be necessary for epidemiological  and / or clinical management purposes  to differentiate between  SARS-CoV-2 and other Sarbecovirus currently known to infect humans.  If clinically indicated additional testing with an alternate test  methodology [phone]) is advised. The SARS-CoV-2 RNA is generally  detectable in upper and lower respiratory sp ecimens during the acute  phase of infection. The expected result is Negative. Fact Sheet for Patients:  StrictlyIdeas.no Fact Sheet for Healthcare Providers: BankingDealers.co.za This test is not yet approved or cleared by the [place] FDA and has been authorized for detection and/or diagnosis of SARS-CoV-2 by FDA under an Emergency Use Authorization (EUA).  This EUA will remain in effect (meaning this test can be used) for the duration of the COVID-19 declaration under Section 564(b)(1) of the Act, 21 U.S.C. section 360bbb-3(b)(1), unless the authorization  is terminated or revoked sooner. Performed at [facility], [address] [place], [place] [address]   Blood Culture ID Panel (Reflexed)     Status: Abnormal   Collection Time: 10/16/18  2:26 AM  Result Value Ref Range Status   Enterococcus species NOT DETECTED NOT DETECTED Final   Listeria monocytogenes NOT DETECTED NOT DETECTED Final   Staphylococcus species NOT DETECTED NOT DETECTED Final   Staphylococcus aureus (BCID) NOT DETECTED NOT DETECTED Final   Streptococcus species NOT DETECTED NOT DETECTED Final   Streptococcus agalactiae NOT DETECTED NOT DETECTED Final   Streptococcus pneumoniae NOT DETECTED NOT DETECTED Final   Streptococcus pyogenes NOT DETECTED NOT DETECTED Final   Acinetobacter baumannii NOT DETECTED NOT DETECTED Final   Enterobacteriaceae species DETECTED (A) NOT DETECTED Final    Comment:  Enterobacteriaceae represent a large family of gram-negative bacteria, not a single organism. CRITICAL RESULT CALLED TO, READ BACK BY AND VERIFIED WITH: ELLINGTON,A AT [id number] ON 10/16/2018 BY JPM    Enterobacter cloacae complex NOT DETECTED NOT DETECTED Final   Escherichia coli NOT DETECTED NOT DETECTED Final   Klebsiella oxytoca DETECTED (A) NOT DETECTED Final    Comment: CRITICAL RESULT CALLED TO, READ BACK BY AND VERIFIED WITH: ELLINGTON,A AT 1747 ON 10/16/2018 BY JPM    Klebsiella pneumoniae NOT DETECTED NOT DETECTED Final   Proteus species NOT DETECTED NOT DETECTED Final   Serratia marcescens NOT DETECTED NOT DETECTED Final   Carbapenem resistance NOT DETECTED NOT DETECTED Final   Haemophilus influenzae NOT DETECTED NOT DETECTED Final   Neisseria meningitidis NOT DETECTED NOT DETECTED Final   Pseudomonas aeruginosa NOT DETECTED NOT DETECTED Final   Candida albicans NOT DETECTED NOT DETECTED Final   Candida glabrata NOT DETECTED NOT DETECTED Final   Candida krusei NOT DETECTED NOT DETECTED Final   Candida parapsilosis NOT DETECTED NOT DETECTED Final   Candida tropicalis NOT DETECTED NOT DETECTED Final    Comment: Performed at [facility], [address] [place], [place] [address]    Coagulation Studies: Recent Labs    10/16/18 0226 10/17/18 0328  LABPROT 25.8* 26.9*  INR 2.4* 2.5*    Urinalysis: Recent Labs    10/16/18 0226  COLORURINE YELLOW*  LABSPEC 1.011  PHURINE 5.0  GLUCOSEU NEGATIVE  HGBUR MODERATE*  BILIRUBINUR NEGATIVE  KETONESUR NEGATIVE  PROTEINUR 30*  NITRITE NEGATIVE  LEUKOCYTESUR MODERATE*      Imaging: Dg Foot Complete Right  Result Date: 10/16/2018 CLINICAL DATA:  Fall, foot pain and bruising EXAM: RIGHT FOOT COMPLETE - 3+ VIEW COMPARISON:  None. FINDINGS: No fracture or dislocation. There is diffuse osteopenia. Surgical clips seen overlying the medial aspect of the ankle. Scattered dense vascular calcifications. IMPRESSION: No acute  osseous abnormality. Electronically Signed   By: Prudencio Pair M.D.   On: 10/16/2018 02:32     Medications:   . sodium chloride 75 mL/hr at 10/16/18 2230  . cefTRIAXone (ROCEPHIN)  IV 2 g (10/16/18 2236)   . amLODipine  10 mg Oral Daily  . buPROPion  300 mg Oral Daily  . calcitRIOL  0.25 mcg Oral QHS  . citalopram  20 mg Oral Daily  . dicyclomine  10 mg Oral TID AC  . donepezil  10 mg Oral QHS  . labetalol  200 mg Oral BID  . magnesium oxide  400 mg Oral BID  . mometasone-formoterol  2 puff Inhalation BID  . mycophenolate  250 mg Oral q morning - 10a   And  . mycophenolate  500 mg Oral QHS  . pantoprazole  40 mg Oral Daily  . tacrolimus  0.5 mg Oral BID  . warfarin  3 mg Oral ONCE-1800  . Warfarin - Pharmacist Dosing Inpatient   Does not apply q1800   acetaminophen **OR** acetaminophen, albuterol, bisacodyl, diphenoxylate-atropine, hydrALAZINE, HYDROcodone-acetaminophen, ondansetron **OR** ondansetron (ZOFRAN) IV  Assessment/ Plan:  Eric Gill is a 68 y.o. 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 Acute renal failure on Chronic kidney disease stage IVT Status post renal transplantation in 2001. His baseline creatinine is 2.8, GFR of 23 on 03/27/18. Acute renal failure seems secondary to prerenal azotemia and current infection.  Followed by Dr. Alger Simons, [facility]. - Continue mycophenolate, tacrolimus and prednisone. - holding furosemide.  - Continue IV fluids  2. Urinary tract infection: Klebsiella oxytoca. Received  ceftriaxone in the ED.  - empric cefepime  2. Anemia of chronic kidney disease: hemoglobin 9.6  3. Hypertension. Blood pressure at goal.   - continueamlodipine, labetalol  4. Secondary hyperparathyroidism.  -Continuecalcitriol   LOS: 1 Odessia Asleson 9/9/202012:17 PM

## 2018-10-17 NOTE — TOC Initial Note (Signed)
Transition of Care [facility]) - Initial/Assessment Note    Patient Details  Name: Eric Gill MRN: [id number] Date of Birth: 22-Dec-1950  Transition [facility] CM/SW Contact:    Anik Wesch, Lenice Llamas Phone Number: [phone]  10/17/2018, 4:49 PM  Clinical Narrative: Clinical Social Worker ([place]) received call from [facility] representative stating they have been trying to see patient at home however the patient and his wife have not allowed them to start services yet. CSW met with patient alone at bedside today and made him aware of above. Patient reported that he wants to use [facility] for home health however it has not been a good time to let them come. CSW explained that [facility] can help patient at home with medical needs and prevent him from coming back to the hospital. Patient verbalized his understanding. Patient reported that he plans on going home after hospital stay. Patient reported that he lives in [place] with his wife Rodena Piety. CSW will continue to follow and assist as needed.                  Expected Discharge Plan: [place] Barriers to Discharge: Continued Medical Work up   Patient Goals and CMS Choice Patient states their goals for this hospitalization and ongoing recovery are:: To go home.      Expected Discharge Plan and Services Expected Discharge Plan: [place] In-house Referral: Clinical Social Work     Living arrangements for the past 2 months: [place]: PT [place] Agency: [place] ([place]) Date [place]: 10/17/18   Representative spoke with at [place]: Corene Cornea  Prior Living Arrangements/Services Living arrangements for the past 2 months: Single Family Home Lives with:: Spouse Patient language and need for interpreter reviewed:: No Do you feel safe going back to the place where you live?: Yes      Need for Family  Participation in Patient Care: Yes (Comment) Care giver support system in place?: Yes (comment) Current home services: Home PT Criminal Activity/Legal Involvement Pertinent to Current Situation/Hospitalization: No - Comment as needed  Activities of Daily Living Home Assistive Devices/Equipment: None ADL Screening (condition at time of admission) [name] cognitive ability adequate to safely complete daily activities?: Yes Is the patient deaf or have difficulty hearing?: No Does the patient have difficulty seeing, even when wearing glasses/contacts?: No Does the patient have difficulty concentrating, remembering, or making decisions?: No Patient able to express need for assistance with ADLs?: Yes Does the patient have difficulty dressing or bathing?: No Independently performs ADLs?: No Communication: Independent Dressing (OT): Needs assistance Is this a change from baseline?: Pre-admission baseline Feeding: Independent with device (comment) Bathing: Independent with device (comment) Toileting: Independent with device (comment) In/Out Bed: Independent with device (comment) Walks in Home: Independent Does the patient have difficulty walking or climbing stairs?: Yes Weakness of Legs: Both Weakness of Arms/Hands: None  Permission Sought/Granted Permission sought to share information with : Other (comment)(Home Health agency) Permission granted to share information with : Yes, Verbal Permission Granted              Emotional Assessment Appearance:: Appears stated age Attitude/Demeanor/Rapport: Engaged Affect (typically observed): Calm, Pleasant Orientation: : Oriented to Self, Oriented to Place, Oriented to  Time, Oriented to Situation Alcohol / Substance  Use: Not Applicable Psych Involvement: No (comment)  Admission diagnosis:  Lower urinary tract infectious disease [N39.0] Sprain of interphalangeal joint of right lesser toe(s), init [S93.514A] Patient Active Problem List    Diagnosis Date Noted  . Renal failure (ARF), acute on chronic (HCC) 10/16/2018  . Rectal bleeding   . GI bleed 02/23/2015  . Acute blood loss anemia 02/23/2015   PCP:  Idelle Crouch, MD Pharmacy:   [place], [place] [address] Phone: [phone] Fax: [phone]  [place], [place] [address] Phone: [phone] Fax: [phone]     Social Determinants of Health (SDOH) Interventions    Readmission Risk Interventions No flowsheet data found.

## 2018-10-17 NOTE — Consult Note (Signed)
ANTICOAGULATION CONSULT NOTE - Initial Consult  Pharmacy Consult for Warfarin Dosing and Monitoring Indication: VTE Treatment   Allergies  Allergen Reactions  . Losartan Cough  . Statins Nausea And Vomiting and Other (See Comments)    Reaction:  Muscle pain    Patient Measurements: Height: 5\' 3"  (160 cm) Weight: 128 lb (58.1 kg) IBW/kg (Calculated) : 56.9  Vital Signs: Temp: 98.4 F (36.9 C) (09/09 0749) Temp Source: Oral (09/09 0749) BP: 139/64 (09/09 0749) Pulse Rate: 74 (09/09 0749)  Labs: Recent Labs    10/15/18 2324 10/15/18 2329 10/16/18 0226 10/17/18 0328  HGB 11.5*  --   --  9.6*  HCT 36.8*  --   --  32.1*  PLT 146*  --   --  108*  LABPROT  --   --  25.8* 26.9*  INR  --   --  2.4* 2.5*  CREATININE 3.51*  --   --  3.71*  TROPONINIHS  --  22*  --   --     Estimated Creatinine Clearance: 14.9 mL/min (A) (by C-G formula based on SCr of 3.71 mg/dL (H)).   Medical History: Past Medical History:  Diagnosis Date  . Anemia   . Antiphospholipid antibody syndrome ([place])   . Arthritis   . DVT (deep venous thrombosis) ([place])   . Essential hypertension   . Gastric ulcer   . GERD (gastroesophageal reflux disease)   . History of CMV   . Hyperlipidemia   . Hyperparathyroidism ([place])   . Pedal edema   . Peripheral vascular disease ([place])   . Rosacea   . Status post kidney transplant 2001  . Stroke ([place])   . TIA (transient ischemic attack)   . Vascular dementia without behavioral disturbance [facility])    Assessment: Pharmacy consulted for warfarin dosing and monitoring in 68 yo male with PMH of DVT. Patient has been admitted with UTI and Acute Renal Failure on CKD w/ PMH of kidney transplant. INR therapeutic on admission.   Home Regimen: Warfarin 3mg : Mon, Wed, Fri                              Warfarin 2mg : [place], Sat, Sun  DATE INR DOSE 9/8 2.4         2 mg 9/9       2.5         3 mg  Goal of Therapy:  INR 2-3 Monitor platelets by anticoagulation  protocol: Yes   Plan:  9/9 Will order patient's home dose of Warfarin 3 mg tonight. INR ordered with AM labs.   Plan for INRs daily per protocol while receiving antibiotics. CBC at least every 3 days per protocol.  Pharmacy will continue to follow and order dose based on levels.   Gerald Dexter, PharmD Pharmacy Resident  10/17/2018 8:41 AM

## 2018-10-17 NOTE — Progress Notes (Signed)
[place] at [place] NAME: Eric Gill    MR#:  [id number]  DATE OF BIRTH:  [birth year]  SUBJECTIVE:  CHIEF COMPLAINT:   Chief Complaint  Patient presents with  . Urinary Frequency  . Weakness   The patient feels better.  He has generalized weakness but denies dysuria or hematuria. REVIEW OF SYSTEMS:  Review of Systems  Constitutional: Positive for fever and malaise/fatigue. Negative for chills.  HENT: Negative for sore throat.   Eyes: Negative for blurred vision and double vision.  Respiratory: Negative for cough, hemoptysis, shortness of breath, wheezing and stridor.   Cardiovascular: Negative for chest pain, palpitations, orthopnea and leg swelling.  Gastrointestinal: Negative for abdominal pain, blood in stool, diarrhea, melena, nausea and vomiting.  Genitourinary: Negative for dysuria, flank pain and hematuria.  Musculoskeletal: Negative for back pain and joint pain.  Skin: Negative for rash.  Neurological: Negative for dizziness, sensory change, focal weakness, seizures, loss of consciousness, weakness and headaches.  Endo/Heme/Allergies: Negative for polydipsia.  Psychiatric/Behavioral: Negative for depression. The patient is not nervous/anxious.     DRUG ALLERGIES:   Allergies  Allergen Reactions  . Losartan Cough  . Statins Nausea And Vomiting and Other (See Comments)    Reaction:  Muscle pain    VITALS:  Blood pressure 139/64, pulse 74, temperature 98.4 F (36.9 C), temperature source Oral, resp. rate 17, height 5\' 3"  (1.6 m), weight 58.1 kg, SpO2 95 %. PHYSICAL EXAMINATION:  Physical Exam Constitutional:      General: He is not in acute distress. HENT:     Head: Normocephalic.     Mouth/Throat:     Mouth: Mucous membranes are moist.  Eyes:     General: No scleral icterus.    Conjunctiva/sclera: Conjunctivae normal.     Pupils: Pupils are equal, round, and reactive to light.  Neck:     Musculoskeletal:  Normal range of motion and neck supple.     Vascular: No JVD.     Trachea: No tracheal deviation.  Cardiovascular:     Rate and Rhythm: Normal rate and regular rhythm.     Heart sounds: Normal heart sounds. No murmur. No gallop.   Pulmonary:     Effort: Pulmonary effort is normal. No respiratory distress.     Breath sounds: Normal breath sounds. No wheezing or rales.  Abdominal:     General: Bowel sounds are normal. There is no distension.     Palpations: Abdomen is soft.     Tenderness: There is no abdominal tenderness. There is no rebound.  Musculoskeletal: Normal range of motion.        General: No tenderness.     Right lower leg: No edema.     Left lower leg: No edema.  Skin:    Findings: No erythema or rash.  Neurological:     General: No focal deficit present.     Mental Status: He is alert and oriented to person, place, and time.     Cranial Nerves: No cranial nerve deficit.  Psychiatric:        Mood and Affect: Mood normal.    LABORATORY PANEL:  Male CBC Recent Labs  Lab 10/17/18 0328  WBC 11.2*  HGB 9.6*  HCT 32.1*  PLT 108*   ------------------------------------------------------------------------------------------------------------------ Chemistries  Recent Labs  Lab 10/17/18 0328  NA 140  K 3.5  CL 114*  CO2 19*  GLUCOSE 92  BUN 38*  CREATININE 3.71*  CALCIUM 8.4*  RADIOLOGY:  No results found. 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 Worsening. Hold lasix, continue IVF and f/u BMP. Continue mycophenolate, tacrolimus and prednisone per Dr.Kolluru.  HTN emergency, improved.  BP is controlled. Continue HTN meds except lasix. Iv hydralazine prn.  UTI and bacteremia.  U/C: No growth.  Blood culture: Klebsiella oxytoca. Continue cefepime.  ID consult.  H/O DVT, PVD, CVA, coumadin PTD.  INR 2.5. Anemia of chronic disease.  Stable.  All the records are reviewed and case discussed with Care Management/Social  Worker. Management plans discussed with the patient, his wife and they are in agreement.  CODE STATUS: Full Code  TOTAL TIME TAKING CARE OF THIS PATIENT: 33 minutes.   More than 50% of the time was spent in counseling/coordination of care: YES  POSSIBLE D/C IN 2-3 DAYS, DEPENDING ON CLINICAL CONDITION.  Demetrios Loll M.D on 10/17/2018 at 3:15 PM  Between 7am to 6pm - Pager - [phone]  After 6pm go to [email or web address] - [facility]

## 2018-10-17 NOTE — Evaluation (Signed)
Physical Therapy Evaluation Patient Details Name: Eric Gill MRN: [id number] DOB: August 07, 1950 Today's Date: 10/17/2018   History of Present Illness  Pt admitted for renal failure with complaints of urinary frequency and weakness. History includes GERD, anemia, DVT, CVA, and vascular dementia.  Clinical Impression  Pt is a pleasant 68 year old male who was admitted for renal failure. Pt performs bed mobility with mod I, transfers with min assist, and ambulation with cga and RW. Pt demonstrates deficits with strength/mobility/balance. Per wife, has been falling lately and occasionally reaches for furniture. Pt educated on falls prevention and encouraged to continue using RW. Would benefit from skilled PT to address above deficits and promote optimal return to PLOF. Recommend transition to [place] upon discharge from acute hospitalization.     Follow Up Recommendations Home health PT    Equipment Recommendations  Rolling walker with 5" wheels    Recommendations for Other Services       Precautions / Restrictions Precautions Precautions: Fall Restrictions Weight Bearing Restrictions: No      Mobility  Bed Mobility Overal bed mobility: Modified Independent             General bed mobility comments: safe technique performed with use of railing. Once seated at EOB, able to sit with upright posture  Transfers Overall transfer level: Needs assistance Equipment used: Rolling walker (2 wheeled) Transfers: Sit to/from Stand Sit to Stand: Min assist         General transfer comment: needs slight assist from lower surface. Once standing, upright posture and good balance with B hand support.  Ambulation/Gait Ambulation/Gait assistance: Min guard Gait Distance (Feet): 100 Feet Assistive device: Rolling walker (2 wheeled) Gait Pattern/deviations: Step-through pattern     General Gait Details: ambulated with reciprocal gait pattern. Slightly flexed posture. Needs VC to keep RW  closer to body. Slightly loses balance during turns, needs min assist for correction.  Stairs            Wheelchair Mobility    Modified Rankin (Stroke Patients Only)       Balance Overall balance assessment: Needs assistance Sitting-balance support: Feet supported;Bilateral upper extremity supported Sitting balance-Leahy Scale: Good     Standing balance support: Bilateral upper extremity supported Standing balance-Leahy Scale: Good                               Pertinent Vitals/Pain Pain Assessment: Faces Faces Pain Scale: Hurts a little bit Pain Location: R foot Pain Descriptors / Indicators: Grimacing;Dull;Discomfort Pain Intervention(s): Limited activity within patient's tolerance;Repositioned    Home Living Family/patient expects to be discharged to:: Private residence Living Arrangements: Spouse/significant other Available Help at Discharge: Family;Available 24 hours/day Type of Home: House Home Access: Stairs to enter Entrance Stairs-Rails: Can reach both Entrance Stairs-Number of Steps: 4 Home Layout: Two level;Able to live on main level with bedroom/bathroom Home Equipment: Kasandra Knudsen - single point      Prior Function Level of Independence: Independent         Comments: ambulated around house and limited community distances. Reports multiple falls in past year. Report broken 2nd toe from fall. Currently buddy taped at this time.     Hand Dominance        Extremity/Trunk Assessment   Upper Extremity Assessment Upper Extremity Assessment: Generalized weakness(B UE grossly 4/5)    Lower Extremity Assessment Lower Extremity Assessment: Generalized weakness(B LE grossly 4/5)       Communication  Communication: No difficulties  Cognition Arousal/Alertness: Awake/alert Behavior During Therapy: WFL for tasks assessed/performed Overall Cognitive Status: Within Functional Limits for tasks assessed                                         General Comments      Exercises Other Exercises Other Exercises: supine ther-ex performed including B LE AP, SLRs, and hip abd/add. 10 reps with supervision noted.   Assessment/Plan    PT Assessment Patient needs continued PT services  PT Problem List Decreased strength;Decreased balance;Decreased mobility;Decreased knowledge of use of DME;Decreased safety awareness;Pain       PT Treatment Interventions DME instruction;Gait training;Therapeutic exercise;Balance training    PT Goals (Current goals can be found in the Care Plan section)  Acute Rehab PT Goals Patient Stated Goal: to go home PT Goal Formulation: With patient Time For Goal Achievement: 10/31/18 Potential to Achieve Goals: Good    Frequency Min 2X/week   Barriers to discharge        Co-evaluation               AM-PAC PT "6 Clicks" Mobility  Outcome Measure Help needed turning from your back to your side while in a flat bed without using bedrails?: None Help needed moving from lying on your back to sitting on the side of a flat bed without using bedrails?: None Help needed moving to and from a bed to a chair (including a wheelchair)?: A Little Help needed standing up from a chair using your arms (e.g., wheelchair or bedside chair)?: A Little Help needed to walk in hospital room?: A Little Help needed climbing 3-5 steps with a railing? : A Little 6 Click Score: 20    End of Session Equipment Utilized During Treatment: Gait belt Activity Tolerance: Patient tolerated treatment well Patient left: in chair;with chair alarm set;with SCD's reapplied Nurse Communication: Mobility status PT Visit Diagnosis: Muscle weakness (generalized) (M62.81);History of falling (Z91.81);Difficulty in walking, not elsewhere classified (R26.2);Pain;Unsteadiness on feet (R26.81) Pain - Right/Left: Right Pain - part of body: Ankle and joints of foot    Time: [id number] PT Time Calculation (min) (ACUTE  ONLY): 38 min   Charges:   PT Evaluation $PT Eval Low Complexity: 1 Low PT Treatments $Therapeutic Exercise: 23-37 mins        Greggory Stallion, PT, DPT [phone]   [name] 10/17/2018, 3:15 PM

## 2018-10-18 DIAGNOSIS — N189 Chronic kidney disease, unspecified: Secondary | ICD-10-CM

## 2018-10-18 DIAGNOSIS — I129 Hypertensive chronic kidney disease with stage 1 through stage 4 chronic kidney disease, or unspecified chronic kidney disease: Secondary | ICD-10-CM

## 2018-10-18 DIAGNOSIS — Z79899 Other long term (current) drug therapy: Secondary | ICD-10-CM

## 2018-10-18 DIAGNOSIS — N179 Acute kidney failure, unspecified: Secondary | ICD-10-CM

## 2018-10-18 DIAGNOSIS — N12 Tubulo-interstitial nephritis, not specified as acute or chronic: Secondary | ICD-10-CM

## 2018-10-18 LAB — CULTURE, BLOOD (ROUTINE X 2): Special Requests: ADEQUATE

## 2018-10-18 LAB — BASIC METABOLIC PANEL
Anion gap: 9 (ref 5–15)
BUN: 34 mg/dL — ABNORMAL HIGH (ref 8–23)
CO2: 17 mmol/L — ABNORMAL LOW (ref 22–32)
Calcium: 8.7 mg/dL — ABNORMAL LOW (ref 8.9–10.3)
Chloride: 117 mmol/L — ABNORMAL HIGH (ref 98–111)
Creatinine, Ser: 3.86 mg/dL — ABNORMAL HIGH (ref 0.61–1.24)
GFR calc Af Amer: 17 mL/min — ABNORMAL LOW (ref >60)
GFR calc non Af Amer: 15 mL/min — ABNORMAL LOW (ref >60)
Glucose, Bld: 93 mg/dL (ref 70–99)
Potassium: 3.9 mmol/L (ref 3.5–5.1)
Sodium: 143 mmol/L (ref 135–145)

## 2018-10-18 LAB — PROTIME-INR
INR: 2.5 — ABNORMAL HIGH (ref 0.8–1.2)
Prothrombin Time: 26.9 seconds — ABNORMAL HIGH (ref 11.4–15.2)

## 2018-10-18 LAB — TACROLIMUS LEVEL: Tacrolimus (FK506) - LabCorp: <1 ng/mL — ABNORMAL LOW (ref 2.0–20.0)

## 2018-10-18 MED ORDER — SODIUM BICARBONATE-DEXTROSE 150-5 MEQ/L-% IV SOLN
150.0000 meq | INTRAVENOUS | Status: DC
  Administered 2018-10-18: 150 meq via INTRAVENOUS
  Filled 2018-10-18 (×6): qty 1000

## 2018-10-18 MED ORDER — WARFARIN SODIUM 2 MG PO TABS
2.0000 mg | ORAL_TABLET | Freq: Once | ORAL | Status: AC
  Administered 2018-10-18: 2 mg via ORAL
  Filled 2018-10-18: qty 1

## 2018-10-18 NOTE — Plan of Care (Signed)
Will continue with plan of care. 

## 2018-10-18 NOTE — TOC Progression Note (Addendum)
Transition of Care [facility]) - Progression Note    Patient Details  Name: TEVIS KIRIN MRN: [id number] Date of Birth: 1950/03/14  Transition of [facility] CM/SW Contact  Khyree Carillo, Lenice Llamas Phone Number: [phone]  10/18/2018, 9:55 AM  Clinical Narrative: Clinical Social Worker (CSW) discussed case with MD. Per MD patient is not ready for D/C today and may need IV ABX. CSW made [facility] representative aware of above. CSW also made [facility] representative aware of above. CSW will continue to follow and assist as needed.   4:09 pm: CSW contacted patient's wife Rodena Piety and made her aware of above. Per wife she will help patient and do IV ABX at home if needed.      Expected Discharge Plan: [place] Barriers to Discharge: Continued Medical Work up  Expected Discharge Plan and Services Expected Discharge Plan: [place] In-house Referral: Clinical Social Work     Living arrangements for the past 2 months: [place]: PT [place]: [place] ([place]) Date [place]: 10/17/18   Representative spoke with at [place]: [place] ([place]) Interventions    Readmission Risk Interventions No flowsheet data found.

## 2018-10-18 NOTE — Progress Notes (Signed)
[facility]  ROUNDING NOTE   Subjective:   Creatinine 3.86 (3.71)  NS at 46mL/hr   Objective:  Vital signs in last 24 hours:  Temp:  [98 F (36.7 C)-99.7 F (37.6 C)] 98.2 F (36.8 C) (09/10 0712) Pulse Rate:  [70-75] 75 (09/10 0712) Resp:  [17-18] 17 (09/10 0712) BP: (126-146)/(66-70) 146/70 (09/10 0712) SpO2:  [90 %-95 %] 90 % (09/10 0712)  Weight change:  Filed Weights   10/15/18 2309  Weight: 58.1 kg    Intake/Output: I/O last 3 completed shifts: In: [id number] [P.O.:660; I.V.:2565.5; IV Piggyback:130.4] Out: 1125 [Urine:1125]   Intake/Output this shift:  No intake/output data recorded.  Physical Exam: General: NAD,   Head: Normocephalic, atraumatic. Moist oral mucosal membranes  Eyes: Anicteric, PERRL  Neck: Supple, trachea midline  Lungs:  Clear to auscultation  Heart: Regular rate and rhythm  Abdomen:  Soft, nontender,   Extremities: no peripheral edema.  Neurologic: Nonfocal, moving all four extremities  Skin: No lesions   GU Condom catheter with yellow urine    Basic Metabolic Panel: Recent Labs  Lab 10/15/18 2324 10/17/18 0328 10/18/18 0642  NA 141 140 143  K 3.9 3.5 3.9  CL 112* 114* 117*  CO2 21* 19* 17*  GLUCOSE 107* 92 93  BUN 33* 38* 34*  CREATININE 3.51* 3.71* 3.86*  CALCIUM 9.4 8.4* 8.7*    Liver Function Tests: No results for input(s): AST, ALT, ALKPHOS, BILITOT, PROT, ALBUMIN in the last 168 hours. No results for input(s): LIPASE, AMYLASE in the last 168 hours. No results for input(s): AMMONIA in the last 168 hours.  CBC: Recent Labs  Lab 10/15/18 2324 10/17/18 0328  WBC 8.7 11.2*  HGB 11.5* 9.6*  HCT 36.8* 32.1*  MCV 92.9 94.7  PLT 146* 108*    Cardiac Enzymes: No results for input(s): CKTOTAL, CKMB, CKMBINDEX, TROPONINI in the last 168 hours.  BNP: Invalid input(s): POCBNP  CBG: No results for input(s): GLUCAP in the last 168 hours.  Microbiology: Results for orders placed or performed during the  hospital encounter of 10/16/18  Urine culture     Status: None   Collection Time: 10/16/18  2:26 AM   Specimen: Urine, Random  Result Value Ref Range Status   Specimen Description   Final    URINE, RANDOM Performed at [facility], [address] [place], [place] [address]    Special Requests   Final    NONE Performed at [facility], [address], [place], [place] [address]    Culture   Final    NO GROWTH Performed at [facility], [place] [address], [place], [place] [address]    Report Status 10/17/2018 FINAL  Final  Blood culture (routine x 2)     Status: None (Preliminary result)   Collection Time: 10/16/18  2:26 AM   Specimen: BLOOD  Result Value Ref Range Status   Specimen Description BLOOD LEFT FA  Final   Special Requests   Final    BOTTLES DRAWN AEROBIC AND ANAEROBIC Blood Culture adequate volume   Culture   Final    NO GROWTH 2 DAYS Performed at [facility], [address] [place], [place] [address]    Report Status PENDING  Incomplete  Blood culture (routine x 2)     Status: Abnormal   Collection Time: 10/16/18  2:26 AM   Specimen: BLOOD  Result Value Ref Range Status   Specimen Description   Final    BLOOD  LEFT FA Performed at [facility], [address] [place], [place] [address]    Special Requests   Final    BOTTLES DRAWN AEROBIC AND ANAEROBIC Blood Culture adequate volume Performed at [facility], [address] [place], [place] [address]    Culture  Setup Time   Final    ANAEROBIC BOTTLE ONLY GRAM NEGATIVE RODS CRITICAL RESULT CALLED TO, READ BACK BY AND VERIFIED WITH: ELLINGON,A AT [id number] ON 10/16/2018 BY JPM Performed at [facility], [place] [address], [place], [place] [address]    Culture KLEBSIELLA OXYTOCA (A)  Final   Report Status 10/18/2018 FINAL  Final   Organism ID, Bacteria KLEBSIELLA OXYTOCA  Final      Susceptibility   Klebsiella oxytoca - MIC*    AMPICILLIN >=32 RESISTANT  Resistant     CEFAZOLIN <=4 SENSITIVE Sensitive     CEFEPIME <=1 SENSITIVE Sensitive     CEFTAZIDIME <=1 SENSITIVE Sensitive     CEFTRIAXONE <=1 SENSITIVE Sensitive     CIPROFLOXACIN <=0.25 SENSITIVE Sensitive     GENTAMICIN <=1 SENSITIVE Sensitive     IMIPENEM <=0.25 SENSITIVE Sensitive     TRIMETH/SULFA <=20 SENSITIVE Sensitive     AMPICILLIN/SULBACTAM 8 SENSITIVE Sensitive     PIP/TAZO <=4 SENSITIVE Sensitive     * KLEBSIELLA OXYTOCA  SARS Coronavirus 2 [facility] order, Performed in [facility] hospital lab) Nasopharyngeal Nasopharyngeal Swab     Status: None   Collection Time: 10/16/18  2:26 AM   Specimen: Nasopharyngeal Swab  Result Value Ref Range Status   SARS Coronavirus 2 NEGATIVE NEGATIVE Final    Comment: (NOTE) If result is NEGATIVE SARS-CoV-2 target nucleic acids are NOT DETECTED. The SARS-CoV-2 RNA is generally detectable in upper and lower  respiratory specimens during the acute phase of infection. The lowest  concentration of SARS-CoV-2 viral copies this assay can detect is 250  copies / mL. A negative result does not preclude SARS-CoV-2 infection  and should not be used as the sole basis for treatment or other  patient management decisions.  A negative result may occur with  improper specimen collection / handling, submission of specimen other  than nasopharyngeal swab, presence of viral mutation(s) within the  areas targeted by this assay, and inadequate number of viral copies  (<250 copies / mL). A negative result must be combined with clinical  observations, patient history, and epidemiological information. If result is POSITIVE SARS-CoV-2 target nucleic acids are DETECTED. The SARS-CoV-2 RNA is generally detectable in upper and lower  respiratory specimens dur ing the acute phase of infection.  Positive  results are indicative of active infection with SARS-CoV-2.  Clinical  correlation with patient history and other diagnostic information is  necessary to  determine patient infection status.  Positive results do  not rule out bacterial infection or co-infection with other viruses. If result is PRESUMPTIVE POSTIVE SARS-CoV-2 nucleic acids MAY BE PRESENT.   A presumptive positive result was obtained on the submitted specimen  and confirmed on repeat testing.  While 2019 novel coronavirus  (SARS-CoV-2) nucleic acids may be present in the submitted sample  additional confirmatory testing may be necessary for epidemiological  and / or clinical management purposes  to differentiate between  SARS-CoV-2 and other Sarbecovirus currently known to infect humans.  If clinically indicated additional testing with an alternate test  methodology [phone]) is advised. The SARS-CoV-2 RNA is generally  detectable in upper and lower respiratory sp ecimens during the acute  phase of  infection. The expected result is Negative. Fact Sheet for Patients:  StrictlyIdeas.no Fact Sheet for Healthcare Providers: BankingDealers.co.za This test is not yet approved or cleared by the [place] FDA and has been authorized for detection and/or diagnosis of SARS-CoV-2 by FDA under an Emergency Use Authorization (EUA).  This EUA will remain in effect (meaning this test can be used) for the duration of the COVID-19 declaration under Section 564(b)(1) of the Act, 21 U.S.C. section 360bbb-3(b)(1), unless the authorization is terminated or revoked sooner. Performed at [facility], [address] [place], [place] [address]   Blood Culture ID Panel (Reflexed)     Status: Abnormal   Collection Time: 10/16/18  2:26 AM  Result Value Ref Range Status   Enterococcus species NOT DETECTED NOT DETECTED Final   Listeria monocytogenes NOT DETECTED NOT DETECTED Final   Staphylococcus species NOT DETECTED NOT DETECTED Final   Staphylococcus aureus (BCID) NOT DETECTED NOT DETECTED Final   Streptococcus species NOT DETECTED NOT  DETECTED Final   Streptococcus agalactiae NOT DETECTED NOT DETECTED Final   Streptococcus pneumoniae NOT DETECTED NOT DETECTED Final   Streptococcus pyogenes NOT DETECTED NOT DETECTED Final   Acinetobacter baumannii NOT DETECTED NOT DETECTED Final   Enterobacteriaceae species DETECTED (A) NOT DETECTED Final    Comment: Enterobacteriaceae represent a large family of gram-negative bacteria, not a single organism. CRITICAL RESULT CALLED TO, READ BACK BY AND VERIFIED WITH: ELLINGTON,A AT [id number] ON 10/16/2018 BY JPM    Enterobacter cloacae complex NOT DETECTED NOT DETECTED Final   Escherichia coli NOT DETECTED NOT DETECTED Final   Klebsiella oxytoca DETECTED (A) NOT DETECTED Final    Comment: CRITICAL RESULT CALLED TO, READ BACK BY AND VERIFIED WITH: ELLINGTON,A AT 1747 ON 10/16/2018 BY JPM    Klebsiella pneumoniae NOT DETECTED NOT DETECTED Final   Proteus species NOT DETECTED NOT DETECTED Final   Serratia marcescens NOT DETECTED NOT DETECTED Final   Carbapenem resistance NOT DETECTED NOT DETECTED Final   Haemophilus influenzae NOT DETECTED NOT DETECTED Final   Neisseria meningitidis NOT DETECTED NOT DETECTED Final   Pseudomonas aeruginosa NOT DETECTED NOT DETECTED Final   Candida albicans NOT DETECTED NOT DETECTED Final   Candida glabrata NOT DETECTED NOT DETECTED Final   Candida krusei NOT DETECTED NOT DETECTED Final   Candida parapsilosis NOT DETECTED NOT DETECTED Final   Candida tropicalis NOT DETECTED NOT DETECTED Final    Comment: Performed at [facility], [address], [place], [place] [address]    Coagulation Studies: Recent Labs    10/16/18 0226 10/17/18 0328 10/18/18 0642  LABPROT 25.8* 26.9* 26.9*  INR 2.4* 2.5* 2.5*    Urinalysis: Recent Labs    10/16/18 0226  COLORURINE YELLOW*  LABSPEC 1.011  PHURINE 5.0  GLUCOSEU NEGATIVE  HGBUR MODERATE*  BILIRUBINUR NEGATIVE  KETONESUR NEGATIVE  PROTEINUR 30*  NITRITE NEGATIVE  LEUKOCYTESUR MODERATE*       Imaging: US Renal  Result Date: 10/17/2018 CLINICAL DATA:  Acute renal failure. EXAM: RENAL / URINARY TRACT ULTRASOUND COMPLETE COMPARISON:  09/29/2017 FINDINGS: Right Kidney: Renal measurements: 3.4 x 1.6 x 1.8 cm. = volume: 5.5 mL. Diffuse increased echogenicity. No mass or hydronephrosis. Left Kidney: Renal measurements: 3.9 x 1.8 x 1.9 cm = volume: 7.2 mL. Diffuse increased echogenicity. No mass or hydronephrosis. Other: Left lower quadrant renal graft measures 10.5 x 5.4 x 4.8 cm. Normal echogenicity. No hydronephrosis. Bladder: Appears normal for degree of bladder distention. IMPRESSION: 1. Normal appearance of left lower quadrant renal graft.  2. Bilateral atrophic and echogenic native kidneys. Electronically Signed   By: Kerby Moors M.D.   On: 10/17/2018 19:21     Medications:   . ceFEPime (MAXIPIME) IV 2 g (10/17/18 1606)  . sodium bicarbonate 150 mEq in dextrose 5% 1000 mL 150 mEq (10/18/18 1123)   . amLODipine  10 mg Oral Daily  . buPROPion  300 mg Oral Daily  . calcitRIOL  0.25 mcg Oral QHS  . citalopram  20 mg Oral Daily  . dicyclomine  10 mg Oral TID AC  . donepezil  10 mg Oral QHS  . labetalol  200 mg Oral BID  . magnesium oxide  400 mg Oral BID  . mometasone-formoterol  2 puff Inhalation BID  . mycophenolate  250 mg Oral q morning - 10a   And  . mycophenolate  500 mg Oral QHS  . pantoprazole  40 mg Oral Daily  . tacrolimus  0.5 mg Oral BID  . warfarin  2 mg Oral ONCE-1800  . Warfarin - Pharmacist Dosing Inpatient   Does not apply q1800   acetaminophen **OR** acetaminophen, albuterol, bisacodyl, diphenoxylate-atropine, hydrALAZINE, HYDROcodone-acetaminophen, ondansetron **OR** ondansetron (ZOFRAN) IV  Assessment/ Plan:  [name] is a 68 y.o. 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 Acute renal failure with metabolic acidosis on Chronic kidney disease stage IVT Status post renal transplantation in 2001. His baseline creatinine is 2.8, GFR of 23 on 03/27/18. Acute renal failure seems secondary to prerenal azotemia and current infection.  Followed by Dr. Alger Simons, [facility]. - Continue mycophenolate, tacrolimus and prednisone. - holding furosemide.  - Continue IV fluids: change to sodium bicarbonate  2. Urinary tract infection: Klebsiella oxytoca.   - empric cefepime  2. Anemia of chronic kidney disease: hemoglobin 9.6  3. Hypertension. Blood pressure at goal.   - continueamlodipine, labetalol  4. Secondary hyperparathyroidism.  -Continuecalcitriol   LOS: 2 Azari Hasler 9/10/202011:39 AM

## 2018-10-18 NOTE — Progress Notes (Signed)
   Date of Admission:  10/16/2018       Subjective: Says he is feeling okay No pain  Medications:  . amLODipine  10 mg Oral Daily  . buPROPion  300 mg Oral Daily  . calcitRIOL  0.25 mcg Oral QHS  . citalopram  20 mg Oral Daily  . dicyclomine  10 mg Oral TID AC  . donepezil  10 mg Oral QHS  . labetalol  200 mg Oral BID  . magnesium oxide  400 mg Oral BID  . mometasone-formoterol  2 puff Inhalation BID  . mycophenolate  250 mg Oral q morning - 10a   And  . mycophenolate  500 mg Oral QHS  . pantoprazole  40 mg Oral Daily  . tacrolimus  0.5 mg Oral BID  . Warfarin - Pharmacist Dosing Inpatient   Does not apply q1800    Objective: Vital signs in last 24 hours: Temp:  [98 F (36.7 C)-98.2 F (36.8 C)] 98.2 F (36.8 C) (09/10 0712) Pulse Rate:  [73-75] 75 (09/10 0712) Resp:  [17-18] 17 (09/10 0712) BP: (146)/(69-70) 146/70 (09/10 0712) SpO2:  [90 %-91 %] 90 % (09/10 0712)  PHYSICAL EXAM:  General: Alert, cooperative, no distress, appears stated age. Slow response at times  Lungs: b/l air entry Heart: s1s2 Abdomen: Soft, non-tender,not distended. Bowel sounds normal. No masses Extremities:thin skin, bruising Neurologic: Grossly non-focal  Lab Results Recent Labs    10/15/18 2324 10/17/18 0328 10/18/18 0642  WBC 8.7 11.2*  --   HGB 11.5* 9.6*  --   HCT 36.8* 32.1*  --   NA 141 140 143  K 3.9 3.5 3.9  CL 112* 114* 117*  CO2 21* 19* 17*  BUN 33* 38* 34*  CREATININE 3.51* 3.71* 3.86*   Liver Panel No results for input(s): PROT, ALBUMIN, AST, ALT, ALKPHOS, BILITOT, BILIDIR, IBILI in the last 72 hours. Sedimentation Rate No results for input(s): ESRSEDRATE in the last 72 hours. C-Reactive Protein No results for input(s): CRP in the last 72 hours.  Microbiology: [facility]- klebsiella Studies/Results: US Renal  Result Date: 10/17/2018 CLINICAL DATA:  Acute renal failure. EXAM: RENAL / URINARY TRACT ULTRASOUND COMPLETE COMPARISON:  09/29/2017 FINDINGS: Right Kidney:  Renal measurements: 3.4 x 1.6 x 1.8 cm. = volume: 5.5 mL. Diffuse increased echogenicity. No mass or hydronephrosis. Left Kidney: Renal measurements: 3.9 x 1.8 x 1.9 cm = volume: 7.2 mL. Diffuse increased echogenicity. No mass or hydronephrosis. Other: Left lower quadrant renal graft measures 10.5 x 5.4 x 4.8 cm. Normal echogenicity. No hydronephrosis. Bladder: Appears normal for degree of bladder distention. IMPRESSION: 1. Normal appearance of left lower quadrant renal graft. 2. Bilateral atrophic and echogenic native kidneys. Electronically Signed   By: Kerby Moors M.D.   On: 10/17/2018 19:21     Assessment/Plan:  Klebsiella bacteremia-on cefepime- inclined to continue IV for 10 days- after 72 hrs can be de-escalated to ceftriaxone- Will discuss with his wife  Pyeloneophritis  Living donor renal transplant  AKI on CKD- [place] no evidence of hydronephrosis- Do post void bladder scan to look for incomplete emptying  HTN on amlodipine and labetolol  Discussed the management with the patient

## 2018-10-18 NOTE — Progress Notes (Signed)
[facility] at [place] NAME: Eric Gill    MR#:  [id number]  DATE OF BIRTH:  1951-01-31  SUBJECTIVE:  CHIEF COMPLAINT:   Chief Complaint  Patient presents with  . Urinary Frequency  . Weakness   The patient feels better.  He has generalized weakness. REVIEW OF SYSTEMS:  Review of Systems  Constitutional: Positive for malaise/fatigue. Negative for chills and fever.  HENT: Negative for sore throat.   Eyes: Negative for blurred vision and double vision.  Respiratory: Negative for cough, hemoptysis, shortness of breath, wheezing and stridor.   Cardiovascular: Negative for chest pain, palpitations, orthopnea and leg swelling.  Gastrointestinal: Negative for abdominal pain, blood in stool, diarrhea, melena, nausea and vomiting.  Genitourinary: Negative for dysuria, flank pain and hematuria.  Musculoskeletal: Negative for back pain and joint pain.  Skin: Negative for rash.  Neurological: Negative for dizziness, sensory change, focal weakness, seizures, loss of consciousness, weakness and headaches.  Endo/Heme/Allergies: Negative for polydipsia.  Psychiatric/Behavioral: Negative for depression. The patient is not nervous/anxious.     DRUG ALLERGIES:   Allergies  Allergen Reactions  . Losartan Cough  . Statins Nausea And Vomiting and Other (See Comments)    Reaction:  Muscle pain    VITALS:  Blood pressure (!) 146/70, pulse 75, temperature 98.2 F (36.8 C), temperature source Oral, resp. rate 17, height 5\' 3"  (1.6 m), weight 58.1 kg, SpO2 90 %. PHYSICAL EXAMINATION:  Physical Exam Constitutional:      General: He is not in acute distress. HENT:     Head: Normocephalic.     Mouth/Throat:     Mouth: Mucous membranes are moist.  Eyes:     General: No scleral icterus.    Conjunctiva/sclera: Conjunctivae normal.     Pupils: Pupils are equal, round, and reactive to light.  Neck:     Musculoskeletal: Normal range of motion and neck  supple.     Vascular: No JVD.     Trachea: No tracheal deviation.  Cardiovascular:     Rate and Rhythm: Normal rate and regular rhythm.     Heart sounds: Normal heart sounds. No murmur. No gallop.   Pulmonary:     Effort: Pulmonary effort is normal. No respiratory distress.     Breath sounds: Normal breath sounds. No wheezing or rales.  Abdominal:     General: Bowel sounds are normal. There is no distension.     Palpations: Abdomen is soft.     Tenderness: There is no abdominal tenderness. There is no rebound.  Musculoskeletal: Normal range of motion.        General: No tenderness.     Right lower leg: No edema.     Left lower leg: No edema.  Skin:    Findings: No erythema or rash.  Neurological:     General: No focal deficit present.     Mental Status: He is alert and oriented to person, place, and time.     Cranial Nerves: No cranial nerve deficit.  Psychiatric:        Mood and Affect: Mood normal.    LABORATORY PANEL:  Male CBC Recent Labs  Lab 10/17/18 0328  WBC 11.2*  HGB 9.6*  HCT 32.1*  PLT 108*   ------------------------------------------------------------------------------------------------------------------ Chemistries  Recent Labs  Lab 10/18/18 0642  NA 143  K 3.9  CL 117*  CO2 17*  GLUCOSE 93  BUN 34*  CREATININE 3.86*  CALCIUM 8.7*   RADIOLOGY:  US  Renal  Result Date: 10/17/2018 CLINICAL DATA:  Acute renal failure. EXAM: RENAL / URINARY TRACT ULTRASOUND COMPLETE COMPARISON:  09/29/2017 FINDINGS: Right Kidney: Renal measurements: 3.4 x 1.6 x 1.8 cm. = volume: 5.5 mL. Diffuse increased echogenicity. No mass or hydronephrosis. Left Kidney: Renal measurements: 3.9 x 1.8 x 1.9 cm = volume: 7.2 mL. Diffuse increased echogenicity. No mass or hydronephrosis. Other: Left lower quadrant renal graft measures 10.5 x 5.4 x 4.8 cm. Normal echogenicity. No hydronephrosis. Bladder: Appears normal for degree of bladder distention. IMPRESSION: 1. Normal appearance of  left lower quadrant renal graft. 2. Bilateral atrophic and echogenic native kidneys. Electronically Signed   By: Kerby Moors M.D.   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 Worsening. Hold lasix, continue IVF, changed to sodium bicarbonate and f/u BMP. Continue mycophenolate, tacrolimus and prednisone per Dr.Kolluru.  HTN emergency, improved.  BP is controlled. Continue HTN meds except lasix. Iv hydralazine prn.  UTI and bacteremia.  U/C: No growth.  Blood culture: Klebsiella oxytoca. Continue cefepime.  ID consult.  H/O DVT, PVD, CVA, coumadin PTD.  INR 2.5. Anemia of chronic disease.  Stable.  All the records are reviewed and case discussed with Care Management/Social Worker. Management plans discussed with the patient, his wife and they are in agreement.  CODE STATUS: Full Code  TOTAL TIME TAKING CARE OF THIS PATIENT: 33 minutes.   More than 50% of the time was spent in counseling/coordination of care: YES  POSSIBLE D/C IN 2-3 DAYS, DEPENDING ON CLINICAL CONDITION.  Demetrios Loll M.D on 10/18/2018 at 3:37 PM  Between 7am to 6pm - Pager - [phone]  After 6pm go to [email or web address] - [facility]

## 2018-10-18 NOTE — Consult Note (Addendum)
ANTICOAGULATION CONSULT NOTE - Initial Consult  Pharmacy Consult for Warfarin Dosing and Monitoring Indication: VTE Treatment   Allergies  Allergen Reactions  . Losartan Cough  . Statins Nausea And Vomiting and Other (See Comments)    Reaction:  Muscle pain    Patient Measurements: Height: 5\' 3"  (160 cm) Weight: 128 lb (58.1 kg) IBW/kg (Calculated) : 56.9  Vital Signs: Temp: 98.2 F (36.8 C) (09/10 0712) Temp Source: Oral (09/10 0712) BP: 146/70 (09/10 0712) Pulse Rate: 75 (09/10 0712)  Labs: Recent Labs    10/15/18 2324 10/15/18 2329 10/16/18 0226 10/17/18 0328 10/18/18 0642  HGB 11.5*  --   --  9.6*  --   HCT 36.8*  --   --  32.1*  --   PLT 146*  --   --  108*  --   LABPROT  --   --  25.8* 26.9* 26.9*  INR  --   --  2.4* 2.5* 2.5*  CREATININE 3.51*  --   --  3.71* 3.86*  TROPONINIHS  --  22*  --   --   --     Estimated Creatinine Clearance: 14.3 mL/min (A) (by C-G formula based on SCr of 3.86 mg/dL (H)).   Medical History: Past Medical History:  Diagnosis Date  . Anemia   . Antiphospholipid antibody syndrome ([place])   . Arthritis   . DVT (deep venous thrombosis) ([place])   . Essential hypertension   . Gastric ulcer   . GERD (gastroesophageal reflux disease)   . History of CMV   . Hyperlipidemia   . Hyperparathyroidism ([place])   . Pedal edema   . Peripheral vascular disease ([place])   . Rosacea   . Status post kidney transplant 2001  . Stroke ([place])   . TIA (transient ischemic attack)   . Vascular dementia without behavioral disturbance [facility])    Assessment: Pharmacy consulted for warfarin dosing and monitoring in 68 yo male with PMH of DVT. Patient has been admitted with UTI and Acute Renal Failure on CKD w/ PMH of kidney transplant. INR therapeutic on admission. Per wife, prefer to keep pt between INR 2.5 and 3.0  d/t APS and PMH of strokes.  Home Regimen: Warfarin 3mg : Mon, Wed, Fri                              Warfarin 2mg : [place] Saupe, Sat,  Sun  DATE INR DOSE 9/8 2.4         2 mg 9/9       2.5         3 mg 9/10     2.5         2 mg  Goal of Therapy:  INR 2-3 Monitor platelets by anticoagulation protocol: Yes   Plan:  9/10 Will order patient's home dose of Warfarin 2 mg tonight. INR ordered with AM labs.   Plan for INRs daily per protocol while receiving antibiotics. CBC at least every 3 days per protocol.  Pharmacy will continue to follow and order dose based on levels.   Gerald Dexter, PharmD Pharmacy Resident  10/18/2018 7:50 AM

## 2018-10-19 ENCOUNTER — Inpatient Hospital Stay: Payer: Medicare Other

## 2018-10-19 DIAGNOSIS — R410 Disorientation, unspecified: Secondary | ICD-10-CM

## 2018-10-19 DIAGNOSIS — I10 Essential (primary) hypertension: Secondary | ICD-10-CM

## 2018-10-19 LAB — RENAL FUNCTION PANEL
Albumin: 3.1 g/dL — ABNORMAL LOW (ref 3.5–5.0)
Anion gap: 7 (ref 5–15)
BUN: 31 mg/dL — ABNORMAL HIGH (ref 8–23)
CO2: 23 mmol/L (ref 22–32)
Calcium: 8.5 mg/dL — ABNORMAL LOW (ref 8.9–10.3)
Chloride: 112 mmol/L — ABNORMAL HIGH (ref 98–111)
Creatinine, Ser: 3.45 mg/dL — ABNORMAL HIGH (ref 0.61–1.24)
GFR calc Af Amer: 20 mL/min — ABNORMAL LOW (ref >60)
GFR calc non Af Amer: 17 mL/min — ABNORMAL LOW (ref >60)
Glucose, Bld: 97 mg/dL (ref 70–99)
Phosphorus: 1.6 mg/dL — ABNORMAL LOW (ref 2.5–4.6)
Potassium: 3.3 mmol/L — ABNORMAL LOW (ref 3.5–5.1)
Sodium: 142 mmol/L (ref 135–145)

## 2018-10-19 LAB — CBC
HCT: 32 % — ABNORMAL LOW (ref 39.0–52.0)
Hemoglobin: 9.9 g/dL — ABNORMAL LOW (ref 13.0–17.0)
MCH: 28.3 pg (ref 26.0–34.0)
MCHC: 30.9 g/dL (ref 30.0–36.0)
MCV: 91.4 fL (ref 80.0–100.0)
Platelets: 109 10*3/uL — ABNORMAL LOW (ref 150–400)
RBC: 3.5 MIL/uL — ABNORMAL LOW (ref 4.22–5.81)
RDW: 13.7 % (ref 11.5–15.5)
WBC: 8 10*3/uL (ref 4.0–10.5)
nRBC: 0 % (ref 0.0–0.2)

## 2018-10-19 LAB — MAGNESIUM: Magnesium: 1.9 mg/dL (ref 1.7–2.4)

## 2018-10-19 LAB — PROTIME-INR
INR: 2.6 — ABNORMAL HIGH (ref 0.8–1.2)
Prothrombin Time: 27.2 seconds — ABNORMAL HIGH (ref 11.4–15.2)

## 2018-10-19 MED ORDER — WARFARIN SODIUM 3 MG PO TABS
3.0000 mg | ORAL_TABLET | Freq: Once | ORAL | Status: AC
  Administered 2018-10-19: 3 mg via ORAL
  Filled 2018-10-19: qty 1

## 2018-10-19 MED ORDER — SODIUM CHLORIDE 0.9 % IV SOLN
2.0000 g | INTRAVENOUS | Status: AC
  Administered 2018-10-19 – 2018-10-30 (×12): 2 g via INTRAVENOUS
  Filled 2018-10-19 (×11): qty 2
  Filled 2018-10-19: qty 20

## 2018-10-19 MED ORDER — POTASSIUM PHOSPHATES 15 MMOLE/5ML IV SOLN
20.0000 mmol | Freq: Once | INTRAVENOUS | Status: AC
  Administered 2018-10-19: 20 mmol via INTRAVENOUS
  Filled 2018-10-19: qty 6.67

## 2018-10-19 NOTE — Progress Notes (Signed)
Date of Admission:  10/16/2018      Spoke to his wife this morning Patient has been confused.  He has been saying that people where breaking into his room last night.  Says it is fine this morning Medications:  . amLODipine  10 mg Oral Daily  . buPROPion  300 mg Oral Daily  . calcitRIOL  0.25 mcg Oral QHS  . citalopram  20 mg Oral Daily  . dicyclomine  10 mg Oral TID AC  . donepezil  10 mg Oral QHS  . labetalol  200 mg Oral BID  . magnesium oxide  400 mg Oral BID  . mometasone-formoterol  2 puff Inhalation BID  . mycophenolate  250 mg Oral q morning - 10a   And  . mycophenolate  500 mg Oral QHS  . pantoprazole  40 mg Oral Daily  . tacrolimus  0.5 mg Oral BID  . warfarin  3 mg Oral ONCE-1800  . Warfarin - Pharmacist Dosing Inpatient   Does not apply q1800    Objective: Vital signs in last 24 hours: Temp:  [98.4 F (36.9 C)-98.7 F (37.1 C)] 98.4 F (36.9 C) (09/11 0939) Pulse Rate:  [74-98] 98 (09/11 0939) Resp:  [18-19] 19 (09/11 0939) BP: (146-197)/(70-85) 197/85 (09/11 0939) SpO2:  [84 %-92 %] 90 % (09/11 0953)  PHYSICAL EXAM:  General: Awake, frail, tremulous at times Head: Normocephalic, without obvious abnormality, atraumatic. Eyes: Conjunctivae clear, anicteric sclerae. Pupils are equal Lungs: Bilateral air entry crepitations in the base Heart: S1-S2 Abdomen: Soft, non-tender,not distended. Bowel sounds normal. No masses Extremities: atraumatic, no cyanosis. No edema. No clubbing Skin: Bruising and thinning of skin Lymph: Cervical, supraclavicular normal. Neurologic: Grossly non-focal  Lab Results Recent Labs    10/17/18 0328 10/18/18 0642 10/19/18 0619  WBC 11.2*  --  8.0  HGB 9.6*  --  9.9*  HCT 32.1*  --  32.0*  NA 140 143 142  K 3.5 3.9 3.3*  CL 114* 117* 112*  CO2 19* 17* 23  BUN 38* 34* 31*  CREATININE 3.71* 3.86* 3.45*   Liver Panel Recent Labs    10/19/18 0619  ALBUMIN 3.1*   Sedimentation Rate No results for input(s): ESRSEDRATE  in the last 72 hours. C-Reactive Protein No results for input(s): CRP in the last 72 hours.  Microbiology: Blood culture Klebsiella oxytoca Studies/Results: US Renal  Result Date: 10/17/2018 CLINICAL DATA:  Acute renal failure. EXAM: RENAL / URINARY TRACT ULTRASOUND COMPLETE COMPARISON:  09/29/2017 FINDINGS: Right Kidney: Renal measurements: 3.4 x 1.6 x 1.8 cm. = volume: 5.5 mL. Diffuse increased echogenicity. No mass or hydronephrosis. Left Kidney: Renal measurements: 3.9 x 1.8 x 1.9 cm = volume: 7.2 mL. Diffuse increased echogenicity. No mass or hydronephrosis. Other: Left lower quadrant renal graft measures 10.5 x 5.4 x 4.8 cm. Normal echogenicity. No hydronephrosis. Bladder: Appears normal for degree of bladder distention. IMPRESSION: 1. Normal appearance of left lower quadrant renal graft. 2. Bilateral atrophic and echogenic native kidneys. Electronically Signed   By: Kerby Moors M.D.   On: 10/17/2018 19:21     Assessment/Plan: Klebsiella bacteremia with pyelonephritis.  Cefepime has been changed to ceftriaxone because of confusion. We will give ceftriaxone for another 10 days. Not a candidate for oral Bactrim because of renal insufficiency as well as on Coumadin Not a candidate for oral ciprofloxacin because of QT issues and being on Coumadin Will not give Keflex because of the risk of resistance developing on antibiotic as  happened in 2018.  Confusion: Could be due to cefepime. INR is in therapeutic range hence risk for TIA or stroke is negligible  Antiphospholipid antibody syndrome on Coumadin  Living donor renal transplant on CellCept, Prograf and prednisone.  AKI on CKD improving slowly.  Ultrasound no evidence of hydronephrosis. Would recommend post void bladder scan to look for residual Hypertension on amlodipine and labetalol  Discussed the management in great detail with his wife.

## 2018-10-19 NOTE — Consult Note (Signed)
PHARMACY CONSULT NOTE - FOLLOW UP  Pharmacy Consult for Electrolyte Monitoring and Replacement   Recent Labs: Potassium (mmol/L)  Date Value  10/19/2018 3.3 (L)   Magnesium (mg/dL)  Date Value  02/27/2015 1.8   Calcium (mg/dL)  Date Value  10/19/2018 8.5 (L)   Albumin (g/dL)  Date Value  10/19/2018 3.1 (L)   Phosphorus (mg/dL)  Date Value  10/19/2018 1.6 (L)   Sodium (mmol/L)  Date Value  10/19/2018 142    Assessment: Pharmacy consulted to monitor and replace electrolytes in 68 yo male with pyelonephritis with PMH of kidney transplant.   9/11 K 3.3, currently subtherapeutic 9/11 Phos 1.6, currently subtherapeutic  Goal of Therapy:  Electrolytes WNl  Plan:  Will give K phos 20 mmol IV x 1 dose.    Will f/u with AM labs.  Eric Gill, PharmD Pharmacy Resident  10/19/2018 1:53 PM

## 2018-10-19 NOTE — Care Management Important Message (Signed)
Important Message  Patient Details  Name: ZAKARIYE PERSAD MRN: [id number] Date of Birth: 09/27/1950   Medicare Important Message Given:  Yes     Juliann Pulse A Dryden Tapley 10/19/2018, 10:30 AM

## 2018-10-19 NOTE — Progress Notes (Signed)
pt had one episode of vomiting gave zofran. wife is here she is conserned that pt is more confused than yesterday. pt states she feels worse today. Notifed MD chen.

## 2018-10-19 NOTE — Progress Notes (Signed)
Post residual void 115. sats down 83% head up and IS did not bring sats up applied 2 l o2 sats 90% notified MD Bridgett Larsson

## 2018-10-19 NOTE — Progress Notes (Addendum)
[place] [facility] at [place] NAME: Eric Gill    MR#:  [id number]  DATE OF BIRTH:  21-Jun-1950  SUBJECTIVE:  CHIEF COMPLAINT:   Chief Complaint  Patient presents with  . Urinary Frequency  . Weakness   The patient has worsening generalized weakness.  He feels nausea this morning.  He vomited once per RN.  He also has hypoxia and put on oxygen by nasal cannula 2 L. REVIEW OF SYSTEMS:  Review of Systems  Constitutional: Positive for malaise/fatigue. Negative for chills and fever.  HENT: Negative for sore throat.   Eyes: Negative for blurred vision and double vision.  Respiratory: Negative for cough, hemoptysis, shortness of breath, wheezing and stridor.   Cardiovascular: Negative for chest pain, palpitations, orthopnea and leg swelling.  Gastrointestinal: Positive for nausea and vomiting. Negative for abdominal pain, blood in stool, diarrhea and melena.  Genitourinary: Negative for dysuria, flank pain and hematuria.  Musculoskeletal: Negative for back pain and joint pain.  Skin: Negative for rash.  Neurological: Negative for dizziness, sensory change, focal weakness, seizures, loss of consciousness, weakness and headaches.  Endo/Heme/Allergies: Negative for polydipsia.  Psychiatric/Behavioral: Negative for depression. The patient is not nervous/anxious.     DRUG ALLERGIES:   Allergies  Allergen Reactions  . Losartan Cough  . Statins Nausea And Vomiting and Other (See Comments)    Reaction:  Muscle pain    VITALS:  Blood pressure (!) 197/85, pulse 98, temperature 98.4 F (36.9 C), temperature source Oral, resp. rate 19, height 5\' 3"  (1.6 m), weight 58.1 kg, SpO2 90 %. PHYSICAL EXAMINATION:  Physical Exam Constitutional:      General: He is not in acute distress. HENT:     Head: Normocephalic.     Mouth/Throat:     Mouth: Mucous membranes are moist.  Eyes:     General: No scleral icterus.    Conjunctiva/sclera: Conjunctivae  normal.     Pupils: Pupils are equal, round, and reactive to light.  Neck:     Musculoskeletal: Normal range of motion and neck supple.     Vascular: No JVD.     Trachea: No tracheal deviation.  Cardiovascular:     Rate and Rhythm: Normal rate and regular rhythm.     Heart sounds: Normal heart sounds. No murmur. No gallop.   Pulmonary:     Effort: Pulmonary effort is normal. No respiratory distress.     Breath sounds: Normal breath sounds. No wheezing or rales.  Abdominal:     General: Bowel sounds are normal. There is no distension.     Palpations: Abdomen is soft.     Tenderness: There is no abdominal tenderness. There is no rebound.  Musculoskeletal: Normal range of motion.        General: No tenderness.     Right lower leg: No edema.     Left lower leg: No edema.  Skin:    Findings: No erythema or rash.  Neurological:     General: No focal deficit present.     Mental Status: He is alert and oriented to person, place, and time.     Cranial Nerves: No cranial nerve deficit.  Psychiatric:        Mood and Affect: Mood normal.    LABORATORY PANEL:  Male CBC Recent Labs  Lab 10/19/18 0619  WBC 8.0  HGB 9.9*  HCT 32.0*  PLT 109*   ------------------------------------------------------------------------------------------------------------------ Chemistries  Recent Labs  Lab 10/19/18 0619  NA 142  K 3.3*  CL 112*  CO2 23  GLUCOSE 97  BUN 31*  CREATININE 3.45*  CALCIUM 8.5*   RADIOLOGY:  No results found. 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 A little better than yesterday. Hold lasix, continue IVF, changed to sodium bicarbonate and f/u BMP. Continue mycophenolate, tacrolimus and prednisone per Dr.Kolluru.  HTN emergency Continue HTN meds except lasix. Iv hydralazine prn.  UTI and bacteremia.  U/C: No growth.  Blood culture: Klebsiella oxytoca. Cefepime is discontinued, changed to Rocephin and IV antibiotics for total 10  days per Dr. Steva Ready.  H/O DVT, PVD, CVA, coumadin PTD.  INR 2.6. Anemia of chronic disease.  Stable.  Hypokalemia.  Potassium supplement.  Hypophosphatemia.  Phosphate supplement.  Hypoxia.  Unclear etiology. Continue oxygen by nasal cannula, NEB PRN, chest x-ray.  Discussed with Dr. Juleen China. All the records are reviewed and case discussed with Care Management/Social Worker. Management plans discussed with the patient, his wife and they are in agreement.  CODE STATUS: Full Code  TOTAL TIME TAKING CARE OF THIS PATIENT: 35 minutes.   More than 50% of the time was spent in counseling/coordination of care: YES  POSSIBLE D/C IN 3 DAYS, DEPENDING ON CLINICAL CONDITION.   Demetrios Loll M.D on 10/19/2018 at 12:42 PM  Between 7am to 6pm - Pager - [phone]  After 6pm go to [email or web address] - [facility]

## 2018-10-19 NOTE — Progress Notes (Signed)
Physical Therapy Treatment Patient Details Name: Eric Gill MRN: [id number] DOB: [birth year] Today's Date: 10/19/2018    History of Present Illness Pt admitted for renal failure with complaints of urinary frequency and weakness. History includes GERD, anemia, DVT, CVA, and vascular dementia.    PT Comments    Pt is progressing toward his goals. Oxygen was monitored throughout treatment today due to drops throughout the day. SpO2 dropped down to 83 during therex. Cueing to breathe deeply with pursed lips returned SpO2 to 90 at the end of the session. He was able to complete treatment with supplemental oxygen (2 L/min via [place]) .LE strength exercises performed in bed revealed strong LE for ambulation. Pt was able to ambulate 80 ft today with a step-through pattern. Pt reported pain in R foot, which contributed to a slightly antalgic gait pattern. Pt. requires visual and verbal cueing on proper use of the walker as well as during sit-to-stand transfer for safety. He will continue to benefit from physical therapy to address deficits described above in strength, balance, and pain.     Follow Up Recommendations  Home health PT;Supervision for mobility/OOB     Equipment Recommendations  Rolling walker with 5" wheels    Recommendations for Other Services       Precautions / Restrictions Precautions Precautions: Fall Restrictions Weight Bearing Restrictions: No    Mobility  Bed Mobility Overal bed mobility: Modified Independent             General bed mobility comments: pt able to perform bed mobility tasks with the use of bedrail  Transfers Overall transfer level: Needs assistance Equipment used: Rolling walker (2 wheeled) Transfers: Sit to/from Stand Sit to Stand: Min assist         General transfer comment: pt requires visual and verbal cuing for hand placement to complete transfer  Ambulation/Gait Ambulation/Gait assistance: Min guard Gait Distance (Feet): 80  Feet Assistive device: Rolling walker (2 wheeled) Gait Pattern/deviations: Step-through pattern;Antalgic     General Gait Details: Pt amb using RW; gait pattern was slightly antalgic due to pain in R foot. Trunk foward flexed; cueing required to remain close to the walker throughout walking and turning. All amb performed with 2L supplemental oxygen; O2sat dropped to 83 during gait. When seated, he was cued for pursed lip breathing and O2sat returned to 90.   Stairs             Wheelchair Mobility    Modified Rankin (Stroke Patients Only)       Balance Overall balance assessment: Needs assistance Sitting-balance support: Feet supported;Bilateral upper extremity supported Sitting balance-Leahy Scale: Good     Standing balance support: Bilateral upper extremity supported Standing balance-Leahy Scale: Good                              Cognition Arousal/Alertness: Awake/alert Behavior During Therapy: WFL for tasks assessed/performed Overall Cognitive Status: Within Functional Limits for tasks assessed                                 General Comments: Spoke with RN regarding medication "visual hallucinations" in chart; cleared to ambulate      Exercises Other Exercises Other Exercises: supine ther-ex performed including B LE AP, SLRs, and hip abd/add. 12 reps with supervision. Small resistance applied for abduction Other Exercises: Pt. education on pursed lip breathing to improve SpO2  levels    General Comments General comments (skin integrity, edema, etc.): Pt oxygen monitored throughout tx. 2L o2 administered via [place] to keep SpO2 stable. Prior to tx. SpO2 was 88. Dropped to low of 83 during therex, but recovered with cueing for breathing       Pertinent Vitals/Pain Pain Assessment: 0-10 Pain Score: 3  Pain Location: R foot Pain Descriptors / Indicators: Grimacing;Dull;Discomfort Pain Intervention(s): Limited activity within patient's  tolerance    Home Living                      Prior Function            PT Goals (current goals can now be found in the care plan section) Acute Rehab PT Goals Patient Stated Goal: to go home PT Goal Formulation: With patient Time For Goal Achievement: 10/31/18 Potential to Achieve Goals: Good Progress towards PT goals: Progressing toward goals    Frequency    Min 2X/week      PT Plan Current plan remains appropriate    Co-evaluation              AM-PAC PT "6 Clicks" Mobility   Outcome Measure  Help needed turning from your back to your side while in a flat bed without using bedrails?: None Help needed moving from lying on your back to sitting on the side of a flat bed without using bedrails?: None Help needed moving to and from a bed to a chair (including a wheelchair)?: A Little Help needed standing up from a chair using your arms (e.g., wheelchair or bedside chair)?: A Little Help needed to walk in hospital room?: A Little Help needed climbing 3-5 steps with a railing? : A Little 6 Click Score: 20    End of Session Equipment Utilized During Treatment: Gait belt;Oxygen(2L O2 via [place]) Activity Tolerance: Patient limited by fatigue(fatigue evidenced by SOB) Patient left: with SCD's reapplied;in bed;with bed alarm set   PT Visit Diagnosis: Muscle weakness (generalized) (M62.81);History of falling (Z91.81);Difficulty in walking, not elsewhere classified (R26.2);Pain;Unsteadiness on feet (R26.81) Pain - Right/Left: Right Pain - part of body: Ankle and joints of foot     Time: 1545-1618 PT Time Calculation (min) (ACUTE ONLY): 33 min  Charges:  $Gait Training: 8-22 mins $Therapeutic Exercise: 8-22 mins                    Eric Gill, SPT    Eric Gill 10/19/2018, 5:12 PM

## 2018-10-19 NOTE — Progress Notes (Signed)
[facility]  ROUNDING NOTE   Subjective:   Wife at bedside. Patient having confusion and visual hallucinations.   Bicarb infusion   Objective:  Vital signs in last 24 hours:  Temp:  [98.4 F (36.9 C)-98.7 F (37.1 C)] 98.4 F (36.9 C) (09/11 0939) Pulse Rate:  [74-98] 98 (09/11 0939) Resp:  [18-19] 19 (09/11 0939) BP: (146-197)/(70-85) 197/85 (09/11 0939) SpO2:  [84 %-92 %] 90 % (09/11 0953)  Weight change:  Filed Weights   10/15/18 2309  Weight: 58.1 kg    Intake/Output: I/O last 3 completed shifts: In: 1835.3 [P.O.:720; I.V.:1115.3] Out: 950 [Urine:950]   Intake/Output this shift:  Total I/O In: 1580 [P.O.:240; I.V.:1340] Out: 565 [Urine:565]  Physical Exam: General: NAD,   Head: Normocephalic, atraumatic. Moist oral mucosal membranes  Eyes: Anicteric, PERRL  Neck: Supple, trachea midline  Lungs:  Clear to auscultation  Heart: Regular rate and rhythm  Abdomen:  Soft, nontender,   Extremities: no peripheral edema.  Neurologic: Nonfocal, moving all four extremities  Skin: No lesions   GU Condom catheter with yellow urine    Basic Metabolic Panel: Recent Labs  Lab 10/15/18 2324 10/17/18 0328 10/18/18 0642 10/19/18 0619  NA 141 140 143 142  K 3.9 3.5 3.9 3.3*  CL 112* 114* 117* 112*  CO2 21* 19* 17* 23  GLUCOSE 107* 92 93 97  BUN 33* 38* 34* 31*  CREATININE 3.51* 3.71* 3.86* 3.45*  CALCIUM 9.4 8.4* 8.7* 8.5*  PHOS  --   --   --  1.6*    Liver Function Tests: Recent Labs  Lab 10/19/18 0619  ALBUMIN 3.1*   No results for input(s): LIPASE, AMYLASE in the last 168 hours. No results for input(s): AMMONIA in the last 168 hours.  CBC: Recent Labs  Lab 10/15/18 2324 10/17/18 0328 10/19/18 0619  WBC 8.7 11.2* 8.0  HGB 11.5* 9.6* 9.9*  HCT 36.8* 32.1* 32.0*  MCV 92.9 94.7 91.4  PLT 146* 108* 109*    Cardiac Enzymes: No results for input(s): CKTOTAL, CKMB, CKMBINDEX, TROPONINI in the last 168 hours.  BNP: Invalid input(s):  POCBNP  CBG: No results for input(s): GLUCAP in the last 168 hours.  Microbiology: Results for orders placed or performed during the hospital encounter of 10/16/18  Urine culture     Status: None   Collection Time: 10/16/18  2:26 AM   Specimen: Urine, Random  Result Value Ref Range Status   Specimen Description   Final    URINE, RANDOM Performed at [facility], [address], [place], [place] [address]    Special Requests   Final    NONE Performed at [facility], [address] [place], [place] [address]    Culture   Final    NO GROWTH Performed at [facility], [place] [address] [place], [place] [address]    Report Status 10/17/2018 FINAL  Final  Blood culture (routine x 2)     Status: None (Preliminary result)   Collection Time: 10/16/18  2:26 AM   Specimen: BLOOD  Result Value Ref Range Status   Specimen Description BLOOD LEFT FA  Final   Special Requests   Final    BOTTLES DRAWN AEROBIC AND ANAEROBIC Blood Culture adequate volume   Culture   Final    NO GROWTH 3 DAYS Performed at [facility], [address] [place], [place] [address]    Report Status PENDING  Incomplete  Blood culture (routine x 2)  Status: Abnormal   Collection Time: 10/16/18  2:26 AM   Specimen: BLOOD  Result Value Ref Range Status   Specimen Description   Final    BLOOD LEFT FA Performed at [facility], [address], [place], [place] [address]    Special Requests   Final    BOTTLES DRAWN AEROBIC AND ANAEROBIC Blood Culture adequate volume Performed at [facility], [address] [place], [place] [address]    Culture  Setup Time   Final    ANAEROBIC BOTTLE ONLY GRAM NEGATIVE RODS CRITICAL RESULT CALLED TO, READ BACK BY AND VERIFIED WITH: Dustin Folks AT [id number] ON 10/16/2018 BY JPM Performed at [facility], [place] [address] [place],  [address]    Culture KLEBSIELLA OXYTOCA (A)  Final   Report Status 10/18/2018 FINAL   Final   Organism ID, Bacteria KLEBSIELLA OXYTOCA  Final      Susceptibility   Klebsiella oxytoca - MIC*    AMPICILLIN >=32 RESISTANT Resistant     CEFAZOLIN <=4 SENSITIVE Sensitive     CEFEPIME <=1 SENSITIVE Sensitive     CEFTAZIDIME <=1 SENSITIVE Sensitive     CEFTRIAXONE <=1 SENSITIVE Sensitive     CIPROFLOXACIN <=0.25 SENSITIVE Sensitive     GENTAMICIN <=1 SENSITIVE Sensitive     IMIPENEM <=0.25 SENSITIVE Sensitive     TRIMETH/SULFA <=20 SENSITIVE Sensitive     AMPICILLIN/SULBACTAM 8 SENSITIVE Sensitive     PIP/TAZO <=4 SENSITIVE Sensitive     * KLEBSIELLA OXYTOCA  SARS Coronavirus 2 [facility] order, Performed in [facility] hospital lab) Nasopharyngeal Nasopharyngeal Swab     Status: None   Collection Time: 10/16/18  2:26 AM   Specimen: Nasopharyngeal Swab  Result Value Ref Range Status   SARS Coronavirus 2 NEGATIVE NEGATIVE Final    Comment: (NOTE) If result is NEGATIVE SARS-CoV-2 target nucleic acids are NOT DETECTED. The SARS-CoV-2 RNA is generally detectable in upper and lower  respiratory specimens during the acute phase of infection. The lowest  concentration of SARS-CoV-2 viral copies this assay can detect is 250  copies / mL. A negative result does not preclude SARS-CoV-2 infection  and should not be used as the sole basis for treatment or other  patient management decisions.  A negative result may occur with  improper specimen collection / handling, submission of specimen other  than nasopharyngeal swab, presence of viral mutation(s) within the  areas targeted by this assay, and inadequate number of viral copies  (<250 copies / mL). A negative result must be combined with clinical  observations, patient history, and epidemiological information. If result is POSITIVE SARS-CoV-2 target nucleic acids are DETECTED. The SARS-CoV-2 RNA is generally detectable in upper and lower  respiratory specimens dur ing the acute phase of infection.  Positive  results are  indicative of active infection with SARS-CoV-2.  Clinical  correlation with patient history and other diagnostic information is  necessary to determine patient infection status.  Positive results do  not rule out bacterial infection or co-infection with other viruses. If result is PRESUMPTIVE POSTIVE SARS-CoV-2 nucleic acids MAY BE PRESENT.   A presumptive positive result was obtained on the submitted specimen  and confirmed on repeat testing.  While 2019 novel coronavirus  (SARS-CoV-2) nucleic acids may be present in the submitted sample  additional confirmatory testing may be necessary for epidemiological  and / or clinical management purposes  to differentiate between  SARS-CoV-2 and other Sarbecovirus currently known to infect humans.  If clinically indicated  additional testing with an alternate test  methodology [id number]) is advised. The SARS-CoV-2 RNA is generally  detectable in upper and lower respiratory sp ecimens during the acute  phase of infection. The expected result is Negative. Fact Sheet for Patients:  StrictlyIdeas.no Fact Sheet for Healthcare Providers: BankingDealers.co.za This test is not yet approved or cleared by the [place] FDA and has been authorized for detection and/or diagnosis of SARS-CoV-2 by FDA under an Emergency Use Authorization (EUA).  This EUA will remain in effect (meaning this test can be used) for the duration of the COVID-19 declaration under Section 564(b)(1) of the Act, 21 U.S.C. section 360bbb-3(b)(1), unless the authorization is terminated or revoked sooner. Performed at [facility], [address] [place], [place] [address]   Blood Culture ID Panel (Reflexed)     Status: Abnormal   Collection Time: 10/16/18  2:26 AM  Result Value Ref Range Status   Enterococcus species NOT DETECTED NOT DETECTED Final   Listeria monocytogenes NOT DETECTED NOT DETECTED Final   Staphylococcus  species NOT DETECTED NOT DETECTED Final   Staphylococcus aureus (BCID) NOT DETECTED NOT DETECTED Final   Streptococcus species NOT DETECTED NOT DETECTED Final   Streptococcus agalactiae NOT DETECTED NOT DETECTED Final   Streptococcus pneumoniae NOT DETECTED NOT DETECTED Final   Streptococcus pyogenes NOT DETECTED NOT DETECTED Final   Acinetobacter baumannii NOT DETECTED NOT DETECTED Final   Enterobacteriaceae species DETECTED (A) NOT DETECTED Final    Comment: Enterobacteriaceae represent a large family of gram-negative bacteria, not a single organism. CRITICAL RESULT CALLED TO, READ BACK BY AND VERIFIED WITH: ELL[place],A AT [id number] ON 10/16/2018 BY JPM    Enterobacter cloacae complex NOT DETECTED NOT DETECTED Final   Escherichia coli NOT DETECTED NOT DETECTED Final   Klebsiella oxytoca DETECTED (A) NOT DETECTED Final    Comment: CRITICAL RESULT CALLED TO, READ BACK BY AND VERIFIED WITH: ELLINGTON,A AT 1747 ON 10/16/2018 BY JPM    Klebsiella pneumoniae NOT DETECTED NOT DETECTED Final   Proteus species NOT DETECTED NOT DETECTED Final   Serratia marcescens NOT DETECTED NOT DETECTED Final   Carbapenem resistance NOT DETECTED NOT DETECTED Final   Haemophilus influenzae NOT DETECTED NOT DETECTED Final   Neisseria meningitidis NOT DETECTED NOT DETECTED Final   Pseudomonas aeruginosa NOT DETECTED NOT DETECTED Final   Candida albicans NOT DETECTED NOT DETECTED Final   Candida glabrata NOT DETECTED NOT DETECTED Final   Candida krusei NOT DETECTED NOT DETECTED Final   Candida parapsilosis NOT DETECTED NOT DETECTED Final   Candida tropicalis NOT DETECTED NOT DETECTED Final    Comment: Performed at [facility], [address] [place], [place] [address]    Coagulation Studies: Recent Labs    10/17/18 0328 10/18/18 0642 10/19/18 0619  LABPROT 26.9* 26.9* 27.2*  INR 2.5* 2.5* 2.6*    Urinalysis: No results for input(s): COLORURINE, LABSPEC, PHURINE, GLUCOSEU, HGBUR, BILIRUBINUR,  KETONESUR, PROTEINUR, UROBILINOGEN, NITRITE, LEUKOCYTESUR in the last 72 hours.  Invalid input(s): APPERANCEUR    Imaging: US Renal  Result Date: 10/17/2018 CLINICAL DATA:  Acute renal failure. EXAM: RENAL / URINARY TRACT ULTRASOUND COMPLETE COMPARISON:  09/29/2017 FINDINGS: Right Kidney: Renal measurements: 3.4 x 1.6 x 1.8 cm. = volume: 5.5 mL. Diffuse increased echogenicity. No mass or hydronephrosis. Left Kidney: Renal measurements: 3.9 x 1.8 x 1.9 cm = volume: 7.2 mL. Diffuse increased echogenicity. No mass or hydronephrosis. Other: Left lower quadrant renal graft measures 10.5 x 5.4 x 4.8 cm. Normal echogenicity. No hydronephrosis. Bladder: Appears  normal for degree of bladder distention. IMPRESSION: 1. Normal appearance of left lower quadrant renal graft. 2. Bilateral atrophic and echogenic native kidneys. Electronically Signed   By: Kerby Moors M.D.   On: 10/17/2018 19:21     Medications:   . cefTRIAXone (ROCEPHIN)  IV 2 g (10/19/18 1319)  . sodium bicarbonate 150 mEq in dextrose 5% 1000 mL 75 mL/hr at 10/18/18 1500   . amLODipine  10 mg Oral Daily  . buPROPion  300 mg Oral Daily  . calcitRIOL  0.25 mcg Oral QHS  . citalopram  20 mg Oral Daily  . dicyclomine  10 mg Oral TID AC  . donepezil  10 mg Oral QHS  . labetalol  200 mg Oral BID  . magnesium oxide  400 mg Oral BID  . mometasone-formoterol  2 puff Inhalation BID  . mycophenolate  250 mg Oral q morning - 10a   And  . mycophenolate  500 mg Oral QHS  . pantoprazole  40 mg Oral Daily  . tacrolimus  0.5 mg Oral BID  . warfarin  3 mg Oral ONCE-1800  . Warfarin - Pharmacist Dosing Inpatient   Does not apply q1800   acetaminophen **OR** acetaminophen, albuterol, bisacodyl, diphenoxylate-atropine, hydrALAZINE, HYDROcodone-acetaminophen, ondansetron **OR** ondansetron (ZOFRAN) IV  Assessment/ Plan:  Eric Gill is a 68 y.o. 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 Acute renal failure with metabolic acidosis on Chronic kidney disease stage IVT Status post renal transplantation in 2001. His baseline creatinine is 2.8, GFR of 23 on 03/27/18. Acute renal failure seems secondary to prerenal azotemia and current infection.  Followed by Dr. Alger Simons, [facility]. - Continue mycophenolate, tacrolimus and prednisone. - holding furosemide.  - Continue IV fluids: sodium bicarbonate  2. Urinary tract infection: Klebsiella oxytoca.   - empric ceftriaxone. Suspect cefepime causing altered mental status.   2. Anemia of chronic kidney disease: hemoglobin 9.9  3. Hypertension. Blood pressure at goal.   - continueamlodipine, labetalol  4. Secondary hyperparathyroidism.  -Continuecalcitriol   LOS: 3 Jeananne Bedwell 9/11/20201:22 PM

## 2018-10-19 NOTE — Consult Note (Signed)
ANTICOAGULATION CONSULT NOTE - Initial Consult  Pharmacy Consult for Warfarin Dosing and Monitoring Indication: VTE Treatment   Allergies  Allergen Reactions  . Losartan Cough  . Statins Nausea And Vomiting and Other (See Comments)    Reaction:  Muscle pain    Patient Measurements: Height: 5\' 3"  (160 cm) Weight: 128 lb (58.1 kg) IBW/kg (Calculated) : 56.9  Vital Signs: Temp: 98.4 F (36.9 C) (09/10 2310) Temp Source: Oral (09/10 2310) BP: 150/72 (09/10 2310) Pulse Rate: 84 (09/10 2310)  Labs: Recent Labs    10/17/18 0328 10/18/18 0642 10/19/18 0619  HGB 9.6*  --  9.9*  HCT 32.1*  --  32.0*  PLT 108*  --  109*  LABPROT 26.9* 26.9* 27.2*  INR 2.5* 2.5* 2.6*  CREATININE 3.71* 3.86* 3.45*    Estimated Creatinine Clearance: 16 mL/min (A) (by C-G formula based on SCr of 3.45 mg/dL (H)).   Medical History: Past Medical History:  Diagnosis Date  . Anemia   . Antiphospholipid antibody syndrome ([place])   . Arthritis   . DVT (deep venous thrombosis) ([place])   . Essential hypertension   . Gastric ulcer   . GERD (gastroesophageal reflux disease)   . History of CMV   . Hyperlipidemia   . Hyperparathyroidism ([place])   . Pedal edema   . Peripheral vascular disease ([place])   . Rosacea   . Status post kidney transplant 2001  . Stroke ([place])   . TIA (transient ischemic attack)   . Vascular dementia without behavioral disturbance [facility])    Assessment: Pharmacy consulted for warfarin dosing and monitoring in 68  yo male with PMH of DVT. Patient has been admitted with UTI and Acute Renal Failure on CKD w/ PMH of kidney transplant. INR therapeutic on admission. Per wife, prefer to keep pt between INR 2.5 and 3.0 d/t APS and PMH of strokes.  Home Regimen: Warfarin 3mg : Mon, Wed, Fri                              Warfarin 2mg : Cain Saupe, Sat, Sun  DATE INR DOSE 9/8 2.4         2 mg 9/9       2.5         3 mg 9/10     2.5         2 mg 9/11     2.6         3 mg  Goal of Therapy:   INR 2-3 Monitor platelets by anticoagulation protocol: Yes   Plan:  9/11 Will order patient's home dose of Warfarin 3 mg tonight. INR ordered with AM labs.   Plan for INRs daily per protocol while receiving antibiotics. CBC at least every 3 days per protocol.  Pharmacy will continue to follow and order dose based on levels.   Gerald Dexter, PharmD Pharmacy Resident  10/19/2018 7:59 AM

## 2018-10-20 ENCOUNTER — Inpatient Hospital Stay: Payer: Medicare Other

## 2018-10-20 DIAGNOSIS — R4182 Altered mental status, unspecified: Secondary | ICD-10-CM

## 2018-10-20 LAB — BASIC METABOLIC PANEL WITH GFR
Anion gap: 8 (ref 5–15)
BUN: 33 mg/dL — ABNORMAL HIGH (ref 8–23)
CO2: 24 mmol/L (ref 22–32)
Calcium: 8.5 mg/dL — ABNORMAL LOW (ref 8.9–10.3)
Chloride: 108 mmol/L (ref 98–111)
Creatinine, Ser: 3.54 mg/dL — ABNORMAL HIGH (ref 0.61–1.24)
GFR calc Af Amer: 19 mL/min — ABNORMAL LOW
GFR calc non Af Amer: 16 mL/min — ABNORMAL LOW
Glucose, Bld: 91 mg/dL (ref 70–99)
Potassium: 3.5 mmol/L (ref 3.5–5.1)
Sodium: 140 mmol/L (ref 135–145)

## 2018-10-20 LAB — PHOSPHORUS: Phosphorus: 3.4 mg/dL (ref 2.5–4.6)

## 2018-10-20 LAB — PROTIME-INR
INR: 3.1 — ABNORMAL HIGH (ref 0.8–1.2)
Prothrombin Time: 31.1 s — ABNORMAL HIGH (ref 11.4–15.2)

## 2018-10-20 LAB — MAGNESIUM: Magnesium: 1.9 mg/dL (ref 1.7–2.4)

## 2018-10-20 MED ORDER — FUROSEMIDE 10 MG/ML IJ SOLN
40.0000 mg | Freq: Once | INTRAMUSCULAR | Status: AC
  Administered 2018-10-20: 40 mg via INTRAVENOUS
  Filled 2018-10-20: qty 4

## 2018-10-20 MED ORDER — WARFARIN SODIUM 3 MG PO TABS: 3.0000 mg | ORAL_TABLET | ORAL | Status: DC

## 2018-10-20 MED ORDER — WARFARIN SODIUM 2 MG PO TABS
2.0000 mg | ORAL_TABLET | ORAL | Status: DC
  Administered 2018-10-20: 2 mg via ORAL
  Filled 2018-10-20: qty 1

## 2018-10-20 MED ORDER — POTASSIUM CHLORIDE CRYS ER 20 MEQ PO TBCR
40.0000 meq | EXTENDED_RELEASE_TABLET | Freq: Once | ORAL | Status: AC
  Administered 2018-10-20: 40 meq via ORAL
  Filled 2018-10-20: qty 2

## 2018-10-20 NOTE — Consult Note (Addendum)
PHARMACY CONSULT NOTE - FOLLOW UP  Pharmacy Consult for Electrolyte Monitoring and Replacement   Recent Labs: Potassium (mmol/L)  Date Value  10/20/2018 3.5   Magnesium (mg/dL)  Date Value  10/20/2018 1.9   Calcium (mg/dL)  Date Value  10/20/2018 8.5 (L)   Albumin (g/dL)  Date Value  10/19/2018 3.1 (L)   Phosphorus (mg/dL)  Date Value  10/20/2018 3.4   Sodium (mmol/L)  Date Value  10/20/2018 140   Patient received potassium phosphorous 54mmol IV x 1 on 9/11. Furosemide 40mg  IV x 1. Potassium 72mEq PO x 1 ordered.   Will obtain electrolytes with am labs.   Will replace to maintain electrolytes within normal limits.   Pharmacy will continue to monitor and adjust per consult.   Simpson,Michael L, 10/20/2018 10:00 AM

## 2018-10-20 NOTE — Progress Notes (Signed)
[place] [facility] [place] NAME: Eric Gill    MR#:  [id number]  DATE OF BIRTH:  [birth year]  SUBJECTIVE:  CHIEF COMPLAINT:   Chief Complaint  Patient presents with  . Urinary Frequency  . Weakness   Patient reported to be confused and not eating well this morning.  Seen by nephrologist who knows patient's baseline and he recommended neurology consult.  Consult placed. Patient was sitting up in bed with no distress.  REVIEW OF SYSTEMS:  Review of Systems  Constitutional: Negative for chills, fever and malaise/fatigue.  HENT: Negative for sore throat.   Eyes: Negative for blurred vision and double vision.  Respiratory: Negative for cough, hemoptysis, shortness of breath, wheezing and stridor.   Cardiovascular: Negative for chest pain, palpitations, orthopnea and leg swelling.  Gastrointestinal: Negative for abdominal pain, blood in stool, diarrhea, melena, nausea and vomiting.  Genitourinary: Negative for dysuria, flank pain and hematuria.  Musculoskeletal: Negative for back pain and joint pain.  Skin: Negative for rash.  Neurological: Negative for dizziness, sensory change, focal weakness, seizures, loss of consciousness, weakness and headaches.  Endo/Heme/Allergies: Negative for polydipsia.  Psychiatric/Behavioral: Negative for depression. The patient is not nervous/anxious.     DRUG ALLERGIES:   Allergies  Allergen Reactions  . Losartan Cough  . Statins Nausea And Vomiting and Other (See Comments)    Reaction:  Muscle pain    VITALS:  Blood pressure (!) 168/99, pulse (!) 107, temperature 98.1 F (36.7 C), temperature source Oral, resp. rate 18, height 5\' 3"  (1.6 m), weight 58.1 kg, SpO2 (!) 89 %. PHYSICAL EXAMINATION:  Physical Exam Constitutional:      General: He is not in acute distress. HENT:     Head: Normocephalic.     Mouth/Throat:     Mouth: Mucous membranes are moist.  Eyes:     General: No scleral icterus.  Conjunctiva/sclera: Conjunctivae normal.     Pupils: Pupils are equal, round, and reactive to light.  Neck:     Musculoskeletal: Normal range of motion and neck supple.     Vascular: No JVD.     Trachea: No tracheal deviation.  Cardiovascular:     Rate and Rhythm: Normal rate and regular rhythm.     Heart sounds: Normal heart sounds. No murmur. No gallop.   Pulmonary:     Effort: Pulmonary effort is normal. No respiratory distress.     Breath sounds: Normal breath sounds. No wheezing or rales.  Abdominal:     General: Bowel sounds are normal. There is no distension.     Palpations: Abdomen is soft.     Tenderness: There is no abdominal tenderness. There is no rebound.  Musculoskeletal: Normal range of motion.        General: No tenderness.     Right lower leg: No edema.     Left lower leg: No edema.  Skin:    Findings: No erythema or rash.  Neurological:     General: No focal deficit present.     Mental Status: He is alert.     Cranial Nerves: No cranial nerve deficit.     Comments: Confused  Psychiatric:        Mood and Affect: Mood normal.    LABORATORY PANEL:  Male CBC Recent Labs  Lab 10/19/18 0619  WBC 8.0  HGB 9.9*  HCT 32.0*  PLT 109*   ------------------------------------------------------------------------------------------------------------------ Chemistries  Recent Labs  Lab 10/20/18 0352  NA 140  K  3.5  CL 108  CO2 24  GLUCOSE 91  BUN 33*  CREATININE 3.54*  CALCIUM 8.5*  MG 1.9   RADIOLOGY:  No results found. 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 Nephrologist following. Hold lasix, continue IVF, changed to sodium bicarbonate and f/u BMP. Given IV Lasix 40 mg x 1 today. Continue mycophenolate, tacrolimus and prednisone per Dr.Kolluru.  HTN emergency Blood pressure better controlled this morning.  Continue HTN meds except lasix. Iv hydralazine prn.  UTI and bacteremia.  U/C: No growth.  Blood culture:  Klebsiella oxytoca. Cefepime is discontinued, changed to Rocephin and IV antibiotics for total 10 days per Dr. Steva Ready.  Acute metabolic encephalopathy Multifactorial due to ongoing infectious process and hypoxia Nephrologist recommended neurology consult. Neurology consult placed.  H/O DVT, PVD, CVA, coumadin PTD.  INR 3.1 Anemia of chronic disease.  Stable.  Hypokalemia.  Replaced  Hypophosphatemia.  Phosphate supplement.  Hypoxia.  Unclear etiology. Continue oxygen by nasal cannula.requiring 3.5 L of oxygen this morning.   NEB PRN,  Follow-up chest x-ray.  DVT prophylaxis ; patient on Coumadin   all the records are reviewed and case discussed with Care Management/Social Worker. Management plans discussed with the patient, and he is in in agreement.  CODE STATUS: Full Code  TOTAL TIME TAKING CARE OF THIS PATIENT: 34 minutes.   More than 50% of the time was spent in counseling/coordination of care: YES  POSSIBLE D/C IN 3 DAYS, DEPENDING ON CLINICAL CONDITION.   Anala Whisenant M.D on 10/20/2018 at 2:37 PM  Between 7am to 6pm - Pager - [phone]  After 6pm go to [email or web address] - [facility]

## 2018-10-20 NOTE — Progress Notes (Signed)
[facility]  ROUNDING NOTE   Subjective:   Wife at bedside. Patient having confusion and is not eating.   Objective:  Vital signs in last 24 hours:  Temp:  [98.1 F (36.7 C)-98.8 F (37.1 C)] 98.1 F (36.7 C) (09/12 0800) Pulse Rate:  [74-107] 107 (09/12 0904) Resp:  [18] 18 (09/11 2315) BP: (138-168)/(68-99) 168/99 (09/12 0800) SpO2:  [87 %-93 %] 89 % (09/12 0904)  Weight change:  Filed Weights   10/15/18 2309  Weight: 58.1 kg    Intake/Output: I/O last 3 completed shifts: In: 2213 [P.O.:480; I.V.:1501; IV Piggyback:232] Out: 1890 [Urine:1890]   Intake/Output this shift:  No intake/output data recorded.  Physical Exam: General: NAD, laying in bed  Head: Normocephalic, atraumatic. Moist oral mucosal membranes  Eyes: Anicteric, PERRL  Neck: Supple, trachea midline  Lungs:  Clear to auscultation, 2L [place]  Heart: Regular rate and rhythm  Abdomen:  Soft, nontender,   Extremities: no peripheral edema.  Neurologic: Nonfocal, moving all four extremities  Skin: No lesions   GU Condom catheter with yellow urine    Basic Metabolic Panel: Recent Labs  Lab 10/15/18 2324 10/17/18 0328 10/18/18 0642 10/19/18 0619 10/19/18 1432 10/20/18 0352  NA 141 140 143 142  --  140  K 3.9 3.5 3.9 3.3*  --  3.5  CL 112* 114* 117* 112*  --  108  CO2 21* 19* 17* 23  --  24  GLUCOSE 107* 92 93 97  --  91  BUN 33* 38* 34* 31*  --  33*  CREATININE 3.51* 3.71* 3.86* 3.45*  --  3.54*  CALCIUM 9.4 8.4* 8.7* 8.5*  --  8.5*  MG  --   --   --   --  1.9 1.9  PHOS  --   --   --  1.6*  --  3.4    Liver Function Tests: Recent Labs  Lab 10/19/18 0619  ALBUMIN 3.1*   No results for input(s): LIPASE, AMYLASE in the last 168 hours. No results for input(s): AMMONIA in the last 168 hours.  CBC: Recent Labs  Lab 10/15/18 2324 10/17/18 0328 10/19/18 0619  WBC 8.7 11.2* 8.0  HGB 11.5* 9.6* 9.9*  HCT 36.8* 32.1* 32.0*  MCV 92.9 94.7 91.4  PLT 146* 108* 109*    Cardiac  Enzymes: No results for input(s): CKTOTAL, CKMB, CKMBINDEX, TROPONINI in the last 168 hours.  BNP: Invalid input(s): POCBNP  CBG: No results for input(s): GLUCAP in the last 168 hours.  Microbiology: Results for orders placed or performed during the hospital encounter of 10/16/18  Urine culture     Status: None   Collection Time: 10/16/18  2:26 AM   Specimen: Urine, Random  Result Value Ref Range Status   Specimen Description   Final    URINE, RANDOM Performed at [facility], [address], [place], [place] [address]    Special Requests   Final    NONE Performed at [facility], [address], [place], [place] [address]    Culture   Final    NO GROWTH Performed at [facility], [place] [address], [place], [place] [address]    Report Status 10/17/2018 FINAL  Final  Blood culture (routine x 2)     Status: None (Preliminary result)   Collection Time: 10/16/18  2:26 AM   Specimen: BLOOD  Result Value Ref Range Status   Specimen Description BLOOD LEFT FA  Final   Special Requests  Final    BOTTLES DRAWN AEROBIC AND ANAEROBIC Blood Culture adequate volume   Culture   Final    NO GROWTH 4 DAYS Performed at [facility], [address] [place], [place] [address]    Report Status PENDING  Incomplete  Blood culture (routine x 2)     Status: Abnormal   Collection Time: 10/16/18  2:26 AM   Specimen: BLOOD  Result Value Ref Range Status   Specimen Description   Final    BLOOD LEFT FA Performed at [facility], [address] [place], [place] [address]    Special Requests   Final    BOTTLES DRAWN AEROBIC AND ANAEROBIC Blood Culture adequate volume Performed at [facility], [address] [place], [place] [address]    Culture  Setup Time   Final    ANAEROBIC BOTTLE ONLY GRAM NEGATIVE RODS CRITICAL RESULT CALLED TO, READ BACK BY AND VERIFIED WITH: Dustin Folks AT [id number] ON 10/16/2018 BY JPM Performed at [facility], [place] [address] [place],  [address]    Culture KLEBSIELLA OXYTOCA (A)  Final   Report Status 10/18/2018 FINAL  Final   Organism ID, Bacteria KLEBSIELLA OXYTOCA  Final      Susceptibility   Klebsiella oxytoca - MIC*    AMPICILLIN >=32 RESISTANT Resistant     CEFAZOLIN <=4 SENSITIVE Sensitive     CEFEPIME <=1 SENSITIVE Sensitive     CEFTAZIDIME <=1 SENSITIVE Sensitive     CEFTRIAXONE <=1 SENSITIVE Sensitive     CIPROFLOXACIN <=0.25 SENSITIVE Sensitive     GENTAMICIN <=1 SENSITIVE Sensitive     IMIPENEM <=0.25 SENSITIVE Sensitive     TRIMETH/SULFA <=20 SENSITIVE Sensitive     AMPICILLIN/SULBACTAM 8 SENSITIVE Sensitive     PIP/TAZO <=4 SENSITIVE Sensitive     * KLEBSIELLA OXYTOCA  SARS Coronavirus 2 [facility] order, Performed in [facility] hospital lab) Nasopharyngeal Nasopharyngeal Swab     Status: None   Collection Time: 10/16/18  2:26 AM   Specimen: Nasopharyngeal Swab  Result Value Ref Range Status   SARS Coronavirus 2 NEGATIVE NEGATIVE Final    Comment: (NOTE) If result is NEGATIVE SARS-CoV-2 target nucleic acids are NOT DETECTED. The SARS-CoV-2 RNA is generally detectable in upper and lower  respiratory specimens during the acute phase of infection. The lowest  concentration of SARS-CoV-2 viral copies this assay can detect is 250  copies / mL. A negative result does not preclude SARS-CoV-2 infection  and should not be used as the sole basis for treatment or other  patient management decisions.  A negative result may occur with  improper specimen collection / handling, submission of specimen other  than nasopharyngeal swab, presence of viral mutation(s) within the  areas targeted by this assay, and inadequate number of viral copies  (<250 copies / mL). A negative result must be combined with clinical  observations, patient history, and epidemiological information. If result is POSITIVE SARS-CoV-2 target nucleic acids are DETECTED. The SARS-CoV-2 RNA is generally  detectable in upper and lower  respiratory specimens dur ing the acute phase of infection.  Positive  results are indicative of active infection with SARS-CoV-2.  Clinical  correlation with patient history and other diagnostic information is  necessary to determine patient infection status.  Positive results do  not rule out bacterial infection or co-infection with other viruses. If result is PRESUMPTIVE POSTIVE SARS-CoV-2 nucleic acids MAY BE PRESENT.   A presumptive positive result was obtained on the submitted specimen  and confirmed on repeat testing.  While 2019 novel coronavirus  (SARS-CoV-2) nucleic acids may be present in the submitted sample  additional confirmatory testing may be necessary for epidemiological  and / or clinical management purposes  to differentiate between  SARS-CoV-2 and other Sarbecovirus currently known to infect humans.  If clinically indicated additional testing with an alternate test  methodology [phone]) is advised. The SARS-CoV-2 RNA is generally  detectable in upper and lower respiratory sp ecimens during the acute  phase of infection. The expected result is Negative. Fact Sheet for Patients:  StrictlyIdeas.no Fact Sheet for Healthcare Providers: BankingDealers.co.za This test is not yet approved or cleared by the [place] FDA and has been authorized for detection and/or diagnosis of SARS-CoV-2 by FDA under an Emergency Use Authorization (EUA).  This EUA will remain in effect (meaning this test can be used) for the duration of the COVID-19 declaration under Section 564(b)(1) of the Act, 21 U.S.C. section 360bbb-3(b)(1), unless the authorization is terminated or revoked sooner. Performed at [facility], [address] [place], [place] [address]   Blood Culture ID Panel (Reflexed)     Status: Abnormal   Collection Time: 10/16/18  2:26 AM  Result Value Ref Range Status   Enterococcus  species NOT DETECTED NOT DETECTED Final   Listeria monocytogenes NOT DETECTED NOT DETECTED Final   Staphylococcus species NOT DETECTED NOT DETECTED Final   Staphylococcus aureus (BCID) NOT DETECTED NOT DETECTED Final   Streptococcus species NOT DETECTED NOT DETECTED Final   Streptococcus agalactiae NOT DETECTED NOT DETECTED Final   Streptococcus pneumoniae NOT DETECTED NOT DETECTED Final   Streptococcus pyogenes NOT DETECTED NOT DETECTED Final   Acinetobacter baumannii NOT DETECTED NOT DETECTED Final   Enterobacteriaceae species DETECTED (A) NOT DETECTED Final    Comment: Enterobacteriaceae represent a large family of gram-negative bacteria, not a single organism. CRITICAL RESULT CALLED TO, READ BACK BY AND VERIFIED WITH: ELLINGTON,A AT [id number] ON 10/16/2018 BY JPM    Enterobacter cloacae complex NOT DETECTED NOT DETECTED Final   Escherichia coli NOT DETECTED NOT DETECTED Final   Klebsiella oxytoca DETECTED (A) NOT DETECTED Final    Comment: CRITICAL RESULT CALLED TO, READ BACK BY AND VERIFIED WITH: ELLINGTON,A AT 1747 ON 10/16/2018 BY JPM    Klebsiella pneumoniae NOT DETECTED NOT DETECTED Final   Proteus species NOT DETECTED NOT DETECTED Final   Serratia marcescens NOT DETECTED NOT DETECTED Final   Carbapenem resistance NOT DETECTED NOT DETECTED Final   Haemophilus influenzae NOT DETECTED NOT DETECTED Final   Neisseria meningitidis NOT DETECTED NOT DETECTED Final   Pseudomonas aeruginosa NOT DETECTED NOT DETECTED Final   Candida albicans NOT DETECTED NOT DETECTED Final   Candida glabrata NOT DETECTED NOT DETECTED Final   Candida krusei NOT DETECTED NOT DETECTED Final   Candida parapsilosis NOT DETECTED NOT DETECTED Final   Candida tropicalis NOT DETECTED NOT DETECTED Final    Comment: Performed at [facility], [address] [place], [place] [address]    Coagulation Studies: Recent Labs    10/18/18 0642 10/19/18 0619 10/20/18 0352  LABPROT 26.9* 27.2* 31.1*  INR 2.5*  2.6* 3.1*    Urinalysis: No results for input(s): COLORURINE, LABSPEC, PHURINE, GLUCOSEU, HGBUR, BILIRUBINUR, KETONESUR, PROTEINUR, UROBILINOGEN, NITRITE, LEUKOCYTESUR in the last 72 hours.  Invalid input(s): APPERANCEUR    Imaging: Dg Chest Port 1 View  Result Date: 10/19/2018 CLINICAL DATA:  Hypoxia EXAM: PORTABLE CHEST 1 VIEW COMPARISON:  None. FINDINGS: 1320 hours. Low lung volumes. Cardiopericardial silhouette  is at upper limits of normal for size. Retrocardiac left base collapse/consolidation associated with small left pleural effusion. There is pulmonary vascular congestion without overt pulmonary edema. Minimal airspace opacity noted right base. The visualized bony structures of the thorax are intact. IMPRESSION: Low volume film with vascular congestion. Left base collapse/consolidation with small left pleural effusion. Electronically Signed   By: Misty Stanley M.D.   On: 10/19/2018 15:06     Medications:   . cefTRIAXone (ROCEPHIN)  IV Stopped (10/19/18 1511)  . sodium bicarbonate 150 mEq in dextrose 5% 1000 mL Stopped (10/19/18 1511)   . amLODipine  10 mg Oral Daily  . buPROPion  300 mg Oral Daily  . calcitRIOL  0.25 mcg Oral QHS  . citalopram  20 mg Oral Daily  . dicyclomine  10 mg Oral TID AC  . donepezil  10 mg Oral QHS  . furosemide  40 mg Intravenous Once  . labetalol  200 mg Oral BID  . magnesium oxide  400 mg Oral BID  . mometasone-formoterol  2 puff Inhalation BID  . mycophenolate  250 mg Oral q morning - 10a   And  . mycophenolate  500 mg Oral QHS  . pantoprazole  40 mg Oral Daily  . tacrolimus  0.5 mg Oral BID  . warfarin  2 mg Oral Q T,Th,S,Su-1800  . [START ON 10/22/2018] warfarin  3 mg Oral Q M,W,F-1800  . Warfarin - Pharmacist Dosing Inpatient   Does not apply q1800   acetaminophen **OR** acetaminophen, albuterol, bisacodyl, diphenoxylate-atropine, hydrALAZINE, HYDROcodone-acetaminophen, ondansetron **OR** ondansetron (ZOFRAN) IV  Assessment/ Plan:   [name] is a 67 y.o. 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 Acute renal failure with metabolic acidosis on Chronic kidney disease stage IVT Status post renal transplantation in 2001. His baseline creatinine is 2.8, GFR of 23 on 03/27/18. Acute renal failure seems secondary to prerenal azotemia and current infection.  Followed by Dr. Alger Simons, [facility]. - Continue mycophenolate, tacrolimus and prednisone. - Furosemide IV 40mg  x 1  2. Urinary tract infection: Klebsiella oxytoca.   - empric ceftriaxone. Suspect cefepime causing altered mental status.   2. Anemia of chronic kidney disease: hemoglobin 9.9  3. Hypertension. Blood pressure at goal.   - continueamlodipine, labetalol  4. Secondary hyperparathyroidism.  -Continuecalcitriol   LOS: 4 Kani Jobson 9/12/202011:46 AM

## 2018-10-20 NOTE — Consult Note (Signed)
  ANTICOAGULATION CONSULT NOTE - Initial Consult  Pharmacy Consult for Warfarin Dosing and Monitoring Indication: VTE Treatment   Allergies  Allergen Reactions  . Losartan Cough  . Statins Nausea And Vomiting and Other (See Comments)    Reaction:  Muscle pain    Patient Measurements: Height: 5\' 3"  (160 cm) Weight: 128 lb (58.1 kg) IBW/kg (Calculated) : 56.9  Vital Signs: Temp: 98.8 F (37.1 C) (09/11 2315) BP: 143/70 (09/11 2315) Pulse Rate: 75 (09/11 2315)  Labs: Recent Labs    10/18/18 0642 10/19/18 0619 10/20/18 0352  HGB  --  9.9*  --   HCT  --  32.0*  --   PLT  --  109*  --   LABPROT 26.9* 27.2* 31.1*  INR 2.5* 2.6* 3.1*  CREATININE 3.86* 3.45* 3.54*    Estimated Creatinine Clearance: 15.6 mL/min (A) (by C-G formula based on SCr of 3.54 mg/dL (H)).   Medical History: Past Medical History:  Diagnosis Date  . Anemia   . Antiphospholipid antibody syndrome ([place])   . Arthritis   . DVT (deep venous thrombosis) ([place])   . Essential hypertension   . Gastric ulcer   . GERD (gastroesophageal reflux disease)   . History of CMV   . Hyperlipidemia   . Hyperparathyroidism ([place])   . Pedal edema   . Peripheral vascular disease ([place])   . Rosacea   . Status post kidney transplant 2001  . Stroke ([place])   . TIA (transient ischemic attack)   . Vascular dementia without behavioral disturbance [facility])    Assessment: Pharmacy consulted for warfarin dosing and monitoring in 68  yo male with PMH of DVT. Patient has been admitted with UTI and Acute Renal Failure on CKD w/ PMH of kidney transplant. INR therapeutic on admission. Per wife, prefer to keep pt between INR 2.5 and 3.0 d/t APS and PMH of strokes.  Home Regimen: Warfarin 3mg : Mon, Wed, Fri                              Warfarin 2mg : Tues, Thurs, Sat, Sun  Goal of Therapy:  INR 2-3 Monitor platelets by anticoagulation protocol: Yes   Plan:  INR minimally elevated. Patient due to receive warfarin 2mg  today per home  regimen. Will continue with home regimen and obtain INR with am labs.   Plan for INRs daily per protocol while receiving antibiotics. CBC at least every 3 days per protocol.  Pharmacy will continue to follow and order dose based on levels.   Simpson,Michael L, RPh 10/20/2018 7:26 AM

## 2018-10-20 NOTE — Consult Note (Signed)
Reason for Consult:AMS Referring Physician: Kolluru  CC: AMS  HPI: Eric Gill is an 68 y.o. male with multiple medical problems including vascular dementia, stroke, s/p renal transplant who was admitted with elevated BP, ARF and UTI.  Patient also noted to have bacteremia as well. Wife provides much of the history.  Reports that patient has not been the same since admission.  Has required to be fed.  As of yesterday became even more confused.    Past Medical History:  Diagnosis Date  . Anemia   . Antiphospholipid antibody syndrome ([place])   . Arthritis   . DVT (deep venous thrombosis) ([place])   . Essential hypertension   . Gastric ulcer   . GERD (gastroesophageal reflux disease)   . History of CMV   . Hyperlipidemia   . Hyperparathyroidism ([place])   . Pedal edema   . Peripheral vascular disease ([place])   . Rosacea   . Status post kidney transplant 2001  . Stroke ([place])   . TIA (transient ischemic attack)   . Vascular dementia without behavioral disturbance [facility])     Past Surgical History:  Procedure Laterality Date  . CHOLECYSTECTOMY    . COLONOSCOPY WITH PROPOFOL N/A 03/06/2015   Procedure: COLONOSCOPY WITH PROPOFOL;  Surgeon: Josefine Class, MD;  Location: [facility] ENDOSCOPY;  Service: Endoscopy;  Laterality: N/A;  . ELBOW SURGERY    . ESOPHAGOGASTRODUODENOSCOPY Left 03/01/2015   Procedure: ESOPHAGOGASTRODUODENOSCOPY (EGD);  Surgeon: Hulen Luster, MD;  Location: [facility] ENDOSCOPY;  Service: Endoscopy;  Laterality: Left;  . ESOPHAGOGASTRODUODENOSCOPY (EGD) WITH PROPOFOL  03/06/2015   Procedure: ESOPHAGOGASTRODUODENOSCOPY (EGD) WITH PROPOFOL;  Surgeon: Josefine Class, MD;  Location[facility] ENDOSCOPY;  Service: Endoscopy;;  . ESOPHAGOGASTRODUODENOSCOPY (EGD) WITH PROPOFOL N/A 05/25/2015   Procedure: ESOPHAGOGASTRODUODENOSCOPY (EGD) WITH PROPOFOL;  Surgeon: Josefine Class, MD;  Location: [facility] ENDOSCOPY;  Service: Endoscopy;  Laterality: N/A;  . KIDNEY TRANSPLANT    . KNEE  ARTHROSCOPY      No family history on file.  Social History:  reports that he quit smoking about 21 years ago. His smoking use included cigarettes. He has a 30.00 pack-year smoking history. He has never used smokeless tobacco. He reports that he does not drink alcohol or use drugs.  Allergies  Allergen Reactions  . Losartan Cough  . Statins Nausea And Vomiting and Other (See Comments)    Reaction:  Muscle pain     Medications:  I have reviewed the patient's current medications. Prior to Admission:  Medications Prior to Admission  Medication Sig Dispense Refill Last Dose  . amLODipine (NORVASC) 10 MG tablet Take 10 mg by mouth daily.   10/15/2018 at 0800  . budesonide-formoterol (SYMBICORT) 80-4.5 MCG/ACT inhaler Inhale 2 puffs into the lungs 2 (two) times daily.   10/15/2018 at 2000  . buPROPion (WELLBUTRIN XL) 300 MG 24 hr tablet Take 300 mg by mouth daily.   10/15/2018 at 0800  . calcitRIOL (ROCALTROL) 0.25 MCG capsule Take 0.25 mcg by mouth at bedtime.   10/15/2018 at 2000  . citalopram (CELEXA) 20 MG tablet Take 20 mg by mouth daily.   10/15/2018 at 0800  . donepezil (ARICEPT) 10 MG tablet Take 10 mg by mouth at bedtime.   10/15/2018 at 2000  . furosemide (LASIX) 40 MG tablet Take 40 mg by mouth daily as needed.    Past Week at prn  . labetalol (NORMODYNE) 200 MG tablet Take 200 mg by mouth 2 (two) times daily.    10/15/2018 at 2000  .  magnesium oxide (MAG-OX) 400 MG tablet TAKE ONE TABLET BY MOUTH TWICE DAILY   10/15/2018 at 2000  . Multiple Vitamin (MULTI-VITAMINS) TABS Take 1 tablet by mouth daily.    10/15/2018 at 0800  . mycophenolate (CELLCEPT) 250 MG capsule Take 250-500 mg by mouth 2 (two) times daily. 250 mg in the am and 500 mg at bedtime   10/15/2018 at 2000  . pantoprazole (PROTONIX) 40 MG tablet Take 1 tablet (40 mg total) by mouth 2 (two) times daily before a meal. (Patient taking differently: Take 40 mg by mouth daily. ) 60 tablet 0 10/15/2018 at 0800  . potassium chloride (K-DUR) 10 MEQ  tablet Take 10 mEq by mouth 2 (two) times daily.   10/15/2018 at 2000  . tacrolimus (PROGRAF) 0.5 MG capsule Take 1 capsule by mouth 2 (two) times a day.   10/15/2018 at 2000  . warfarin (JANTOVEN) 1 MG tablet Take 2-3 mg by mouth daily. Take two tablets (2mg ) on Sunday, Tuesday, Thursday and Saturday. Take three tablets (3 mg) on Monday, Wednesday and Friday.   10/15/2018 at 1800  . dicyclomine (BENTYL) 10 MG capsule Take 10 mg by mouth 3 (three) times daily before meals.    Completed Course at Unknown time  . diphenoxylate-atropine (LOMOTIL) 2.5-0.025 MG tablet Take 1 tablet by mouth 4 (four) times daily as needed for diarrhea or loose stools.   prn at prn  . fluorouracil (EFUDEX) 5 % cream    prn at prn  . predniSONE (DELTASONE) 5 MG tablet Take 1 tablet (5 mg total) by mouth daily with breakfast. (Patient not taking: Reported on 10/16/2018) 30 tablet 0 Completed Course at Unknown time   Scheduled: . amLODipine  10 mg Oral Daily  . buPROPion  300 mg Oral Daily  . calcitRIOL  0.25 mcg Oral QHS  . citalopram  20 mg Oral Daily  . dicyclomine  10 mg Oral TID AC  . donepezil  10 mg Oral QHS  . labetalol  200 mg Oral BID  . magnesium oxide  400 mg Oral BID  . mometasone-formoterol  2 puff Inhalation BID  . mycophenolate  250 mg Oral q morning - 10a   And  . mycophenolate  500 mg Oral QHS  . pantoprazole  40 mg Oral Daily  . potassium chloride  40 mEq Oral Once  . tacrolimus  0.5 mg Oral BID  . warfarin  2 mg Oral Q T,Th,S,Su-1800  . [START ON 10/22/2018] warfarin  3 mg Oral Q M,W,F-1800  . Warfarin - Pharmacist Dosing Inpatient   Does not apply q1800    ROS: Patient unable to provide  Physical Examination: Blood pressure (!) 168/99, pulse (!) 107, temperature 98.1 F (36.7 C), temperature source Oral, resp. rate 18, height 5\' 3"  (1.6 m), weight 58.1 kg, SpO2 (!) 89 %.  HEENT-  Normocephalic, no lesions, without obvious abnormality.  Normal external eye and conjunctiva.  Normal TM's  bilaterally.  Normal auditory canals and external ears. Normal external nose, mucus membranes and septum.  Normal pharynx. Cardiovascular- S1, S2 normal, pulses palpable throughout   Lungs- chest clear, no wheezing, rales, normal symmetric air entry Abdomen- soft, non-tender; bowel sounds normal; no masses,  no organomegaly Extremities- no edema Lymph-no adenopathy palpable Musculoskeletal-no joint tenderness, deformity or swelling Skin-warm and dry, no hyperpigmentation, vitiligo, or suspicious lesions  Neurological Examination   Mental Status: Alert.  Knows that he is in [place] but reports that he is in a church.  Reports it is  1999.  Easily distracted.  Speech fluent without evidence of aphasia.  Able to follow 3 step commands without difficulty. Cranial Nerves: II: Counts fingers in all visual fields.   III,IV, VI: ptosis not present, extra-ocular motions intact bilaterally V,VII: smile symmetric, facial light touch sensation normal bilaterally VIII: hearing normal bilaterally IX,X: gag reflex present XI: bilateral shoulder shrug XII: midline tongue extension Motor: Right : Upper extremity   5/5    Left:     Upper extremity   5/5  Lower extremity   5/5     Lower extremity   5/5 Tone and bulk:normal tone throughout; no atrophy noted Sensory: Pinprick and light touch intact throughout, bilaterally Deep Tendon Reflexes: Symmetric throughout Plantars: Right: mute   Left: mute Cerebellar: Normal finger-to-nose and normal heel-to-shin testing with only significance being that of tremor Gait: not tested due to safety concerns   Laboratory Studies:   Basic Metabolic Panel: Recent Labs  Lab 10/15/18 2324 10/17/18 0328 10/18/18 0642 10/19/18 0619 10/19/18 1432 10/20/18 0352  NA 141 140 143 142  --  140  K 3.9 3.5 3.9 3.3*  --  3.5  CL 112* 114* 117* 112*  --  108  CO2 21* 19* 17* 23  --  24  GLUCOSE 107* 92 93 97  --  91  BUN 33* 38* 34* 31*  --  33*  CREATININE 3.51*  3.71* 3.86* 3.45*  --  3.54*  CALCIUM 9.4 8.4* 8.7* 8.5*  --  8.5*  MG  --   --   --   --  1.9 1.9  PHOS  --   --   --  1.6*  --  3.4    Liver Function Tests: Recent Labs  Lab 10/19/18 0619  ALBUMIN 3.1*   No results for input(s): LIPASE, AMYLASE in the last 168 hours. No results for input(s): AMMONIA in the last 168 hours.  CBC: Recent Labs  Lab 10/15/18 2324 10/17/18 0328 10/19/18 0619  WBC 8.7 11.2* 8.0  HGB 11.5* 9.6* 9.9*  HCT 36.8* 32.1* 32.0*  MCV 92.9 94.7 91.4  PLT 146* 108* 109*    Cardiac Enzymes: No results for input(s): CKTOTAL, CKMB, CKMBINDEX, TROPONINI in the last 168 hours.  BNP: Invalid input(s): POCBNP  CBG: No results for input(s): GLUCAP in the last 168 hours.  Microbiology: Results for orders placed or performed during the hospital encounter of 10/16/18  Urine culture     Status: None   Collection Time: 10/16/18  2:26 AM   Specimen: Urine, Random  Result Value Ref Range Status   Specimen Description   Final    URINE, RANDOM Performed at [facility], [address], [place], [place] [address]    Special Requests   Final    NONE Performed at [facility], [address], [place], [place] [address]    Culture   Final    NO GROWTH Performed at [facility], [place] [address] [place], [place] [address]    Report Status 10/17/2018 FINAL  Final  Blood culture (routine x 2)     Status: None (Preliminary result)   Collection Time: 10/16/18  2:26 AM   Specimen: BLOOD  Result Value Ref Range Status   Specimen Description BLOOD LEFT FA  Final   Special Requests   Final    BOTTLES DRAWN AEROBIC AND ANAEROBIC Blood Culture adequate volume   Culture   Final    NO GROWTH 4 DAYS Performed at [facility],  [place], [place] [address]    Report Status PENDING  Incomplete  Blood culture (routine x 2)     Status: Abnormal   Collection Time: 10/16/18  2:26 AM   Specimen: BLOOD  Result Value Ref  Range Status   Specimen Description   Final    BLOOD LEFT FA Performed at [facility], [address] [place], [place] [address]    Special Requests   Final    BOTTLES DRAWN AEROBIC AND ANAEROBIC Blood Culture adequate volume Performed at [facility], [address] [place], [place] [address]    Culture  Setup Time   Final    ANAEROBIC BOTTLE ONLY GRAM NEGATIVE RODS CRITICAL RESULT CALLED TO, READ BACK BY AND VERIFIED WITH: Dustin Folks AT [id number] ON 10/16/2018 BY JPM Performed at [facility], [place] [address] [place], [place] [address]    Culture KLEBSIELLA OXYTOCA (A)  Final   Report Status 10/18/2018 FINAL  Final   Organism ID, Bacteria KLEBSIELLA OXYTOCA  Final      Susceptibility   Klebsiella oxytoca - MIC*    AMPICILLIN >=32 RESISTANT Resistant     CEFAZOLIN <=4 SENSITIVE Sensitive     CEFEPIME <=1 SENSITIVE Sensitive     CEFTAZIDIME <=1 SENSITIVE Sensitive     CEFTRIAXONE <=1 SENSITIVE Sensitive     CIPROFLOXACIN <=0.25 SENSITIVE Sensitive     GENTAMICIN <=1 SENSITIVE Sensitive     IMIPENEM <=0.25 SENSITIVE Sensitive     TRIMETH/SULFA <=20 SENSITIVE Sensitive     AMPICILLIN/SULBACTAM 8 SENSITIVE Sensitive     PIP/TAZO <=4 SENSITIVE Sensitive     * KLEBSIELLA OXYTOCA  SARS Coronavirus 2 [facility] order, Performed in [facility] hospital lab) Nasopharyngeal Nasopharyngeal Swab     Status: None   Collection Time: 10/16/18  2:26 AM   Specimen: Nasopharyngeal Swab  Result Value Ref Range Status   SARS Coronavirus 2 NEGATIVE NEGATIVE Final    Comment: (NOTE) If result is NEGATIVE SARS-CoV-2 target nucleic acids are NOT DETECTED. The SARS-CoV-2 RNA is generally detectable in upper and lower  respiratory specimens during the acute phase of infection. The lowest  concentration of SARS-CoV-2 viral copies this assay can detect is 250  copies / mL. A negative result does not preclude SARS-CoV-2 infection  and should not be used as the sole basis for  treatment or other  patient management decisions.  A negative result may occur with  improper specimen collection / handling, submission of specimen other  than nasopharyngeal swab, presence of viral mutation(s) within the  areas targeted by this assay, and inadequate number of viral copies  (<250 copies / mL). A negative result must be combined with clinical  observations, patient history, and epidemiological information. If result is POSITIVE SARS-CoV-2 target nucleic acids are DETECTED. The SARS-CoV-2 RNA is generally detectable in upper and lower  respiratory specimens dur ing the acute phase of infection.  Positive  results are indicative of active infection with SARS-CoV-2.  Clinical  correlation with patient history and other diagnostic information is  necessary to determine patient infection status.  Positive results do  not rule out bacterial infection or co-infection with other viruses. If result is PRESUMPTIVE POSTIVE SARS-CoV-2 nucleic acids MAY BE PRESENT.   A presumptive positive result was obtained on the submitted specimen  and confirmed on repeat testing.  While 2019 novel coronavirus  (SARS-CoV-2) nucleic acids may be present in the submitted sample  additional confirmatory testing may be necessary for epidemiological  and / or clinical management purposes  to differentiate between  SARS-CoV-2 and other Sarbecovirus currently known to infect humans.  If clinically indicated additional testing with an alternate test  methodology [phone]) is advised. The SARS-CoV-2 RNA is generally  detectable in upper and lower respiratory sp ecimens during the acute  phase of infection. The expected result is Negative. Fact Sheet for Patients:  StrictlyIdeas.no Fact Sheet for Healthcare Providers: BankingDealers.co.za This test is not yet approved or cleared by the [place] FDA and has been authorized for detection and/or  diagnosis of SARS-CoV-2 by FDA under an Emergency Use Authorization (EUA).  This EUA will remain in effect (meaning this test can be used) for the duration of the COVID-19 declaration under Section 564(b)(1) of the Act, 21 U.S.C. section 360bbb-3(b)(1), unless the authorization is terminated or revoked sooner. Performed at [facility], [address] [place], [place] [address]   Blood Culture ID Panel (Reflexed)     Status: Abnormal   Collection Time: 10/16/18  2:26 AM  Result Value Ref Range Status   Enterococcus species NOT DETECTED NOT DETECTED Final   Listeria monocytogenes NOT DETECTED NOT DETECTED Final   Staphylococcus species NOT DETECTED NOT DETECTED Final   Staphylococcus aureus (BCID) NOT DETECTED NOT DETECTED Final   Streptococcus species NOT DETECTED NOT DETECTED Final   Streptococcus agalactiae NOT DETECTED NOT DETECTED Final   Streptococcus pneumoniae NOT DETECTED NOT DETECTED Final   Streptococcus pyogenes NOT DETECTED NOT DETECTED Final   Acinetobacter baumannii NOT DETECTED NOT DETECTED Final   Enterobacteriaceae species DETECTED (A) NOT DETECTED Final    Comment: Enterobacteriaceae represent a large family of gram-negative bacteria, not a single organism. CRITICAL RESULT CALLED TO, READ BACK BY AND VERIFIED WITH: ELLINGTON,A AT [id number] ON 10/16/2018 BY JPM    Enterobacter cloacae complex NOT DETECTED NOT DETECTED Final   Escherichia coli NOT DETECTED NOT DETECTED Final   Klebsiella oxytoca DETECTED (A) NOT DETECTED Final    Comment: CRITICAL RESULT CALLED TO, READ BACK BY AND VERIFIED WITH: ELLINGTON,A AT 1747 ON 10/16/2018 BY JPM    Klebsiella pneumoniae NOT DETECTED NOT DETECTED Final   Proteus species NOT DETECTED NOT DETECTED Final   Serratia marcescens NOT DETECTED NOT DETECTED Final   Carbapenem resistance NOT DETECTED NOT DETECTED Final   Haemophilus influenzae NOT DETECTED NOT DETECTED Final   Neisseria meningitidis NOT DETECTED NOT DETECTED Final    Pseudomonas aeruginosa NOT DETECTED NOT DETECTED Final   Candida albicans NOT DETECTED NOT DETECTED Final   Candida glabrata NOT DETECTED NOT DETECTED Final   Candida krusei NOT DETECTED NOT DETECTED Final   Candida parapsilosis NOT DETECTED NOT DETECTED Final   Candida tropicalis NOT DETECTED NOT DETECTED Final    Comment: Performed at [facility], [address] [place], [place] [address]    Coagulation Studies: Recent Labs    10/18/18 [id number] 10/19/18 0619 10/20/18 0352  LABPROT 26.9* 27.2* 31.1*  INR 2.5* 2.6* 3.1*    Urinalysis:  Recent Labs  Lab 10/16/18 0226  COLORURINE YELLOW*  LABSPEC 1.011  PHURINE 5.0  GLUCOSEU NEGATIVE  HGBUR MODERATE*  BILIRUBINUR NEGATIVE  KETONESUR NEGATIVE  PROTEINUR 30*  NITRITE NEGATIVE  LEUKOCYTESUR MODERATE*    Lipid Panel:  No results found for: CHOL, TRIG, HDL, CHOLHDL, VLDL, LDLCALC  HgbA1C: No results found for: HGBA1C  Urine Drug Screen:      Component Value Date/Time   LABOPIA NONE DETECTED 11/13/2016 [place] DETECTED 11/13/2016 0250  LABBENZ NONE DETECTED 11/13/2016 0250   AMPHETMU NONE DETECTED 11/13/2016 0250   THCU NONE DETECTED 11/13/2016 0250   LABBARB NONE DETECTED 11/13/2016 0250    Alcohol Level: No results for input(s): ETH in the last 168 hours.  Other results: EKG: normal sinus rhythm at 77 bpm.  Imaging: Dg Chest Port 1 View  Result Date: 10/19/2018 CLINICAL DATA:  Hypoxia EXAM: PORTABLE CHEST 1 VIEW COMPARISON:  None. FINDINGS: 1320 hours. Low lung volumes. Cardiopericardial silhouette is at upper limits of normal for size. Retrocardiac left base collapse/consolidation associated with small left pleural effusion. There is pulmonary vascular congestion without overt pulmonary edema. Minimal airspace opacity noted right base. The visualized bony structures of the thorax are intact. IMPRESSION: Low volume film with vascular congestion. Left base collapse/consolidation with  small left pleural effusion. Electronically Signed   By: Misty Stanley M.D.   On: 10/19/2018 15:06     Assessment/Plan: 68 year old male with multiple medical problems including vascular dementia, stroke, s/p renal transplant who was admitted with elevated BP, ARF and UTI.  Patient also noted to have bacteremia as well.  Per wife felt to have worsening mental status.  Mental status decline likely multifactorial and related to multiple medical issues including bacteremia, UTI and ARF superimposed on an underlying dementia.  Patient also on Cefepime which may cause mental status changes as well.  This was discontinued on yesterday.  As patient improves due to his underlying cognitive issues it would be expected that his cognitive improvement will lag behind improvement in his lab work and medical issues.   Would rule out other possible etiologies as well.    Recommendations: 1. Head CT without contrast 2. EEG 3. Agree with continuing to address medical issues 4. B1, B12  Alexis Goodell, MD Neurology [phone] 10/20/2018, 2:44 PM

## 2018-10-21 ENCOUNTER — Inpatient Hospital Stay: Payer: Medicare Other

## 2018-10-21 DIAGNOSIS — G9341 Metabolic encephalopathy: Secondary | ICD-10-CM

## 2018-10-21 DIAGNOSIS — R0902 Hypoxemia: Secondary | ICD-10-CM

## 2018-10-21 DIAGNOSIS — R0602 Shortness of breath: Secondary | ICD-10-CM

## 2018-10-21 DIAGNOSIS — B9689 Other specified bacterial agents as the cause of diseases classified elsewhere: Secondary | ICD-10-CM

## 2018-10-21 LAB — CBC
HCT: 30.9 % — ABNORMAL LOW (ref 39.0–52.0)
Hemoglobin: 9.7 g/dL — ABNORMAL LOW (ref 13.0–17.0)
MCH: 28.8 pg (ref 26.0–34.0)
MCHC: 31.4 g/dL (ref 30.0–36.0)
MCV: 91.7 fL (ref 80.0–100.0)
Platelets: 126 10*3/uL — ABNORMAL LOW (ref 150–400)
RBC: 3.37 MIL/uL — ABNORMAL LOW (ref 4.22–5.81)
RDW: 13.6 % (ref 11.5–15.5)
WBC: 9.7 10*3/uL (ref 4.0–10.5)
nRBC: 0 % (ref 0.0–0.2)

## 2018-10-21 LAB — BLOOD GAS, ARTERIAL
Acid-Base Excess: 0.1 mmol/L (ref 0.0–2.0)
Bicarbonate: 24.7 mmol/L (ref 20.0–28.0)
FIO2: 0.44
O2 Saturation: 92.6 %
Patient temperature: 37
pCO2 arterial: 39 mmHg (ref 32.0–48.0)
pH, Arterial: 7.41 (ref 7.350–7.450)
pO2, Arterial: 65 mmHg — ABNORMAL LOW (ref 83.0–108.0)

## 2018-10-21 LAB — RENAL FUNCTION PANEL
Albumin: 3.3 g/dL — ABNORMAL LOW (ref 3.5–5.0)
Anion gap: 11 (ref 5–15)
BUN: 39 mg/dL — ABNORMAL HIGH (ref 8–23)
CO2: 23 mmol/L (ref 22–32)
Calcium: 8.9 mg/dL (ref 8.9–10.3)
Chloride: 105 mmol/L (ref 98–111)
Creatinine, Ser: 3.72 mg/dL — ABNORMAL HIGH (ref 0.61–1.24)
GFR calc Af Amer: 18 mL/min — ABNORMAL LOW (ref >60)
GFR calc non Af Amer: 16 mL/min — ABNORMAL LOW (ref >60)
Glucose, Bld: 104 mg/dL — ABNORMAL HIGH (ref 70–99)
Phosphorus: 3.4 mg/dL (ref 2.5–4.6)
Potassium: 4.1 mmol/L (ref 3.5–5.1)
Sodium: 139 mmol/L (ref 135–145)

## 2018-10-21 LAB — CULTURE, BLOOD (ROUTINE X 2)
Culture: NO GROWTH
Special Requests: ADEQUATE

## 2018-10-21 LAB — PROTIME-INR
INR: 3.6 — ABNORMAL HIGH (ref 0.8–1.2)
Prothrombin Time: 35.3 seconds — ABNORMAL HIGH (ref 11.4–15.2)

## 2018-10-21 LAB — VITAMIN B12: Vitamin B-12: 410 pg/mL (ref 180–914)

## 2018-10-21 LAB — PHOSPHORUS: Phosphorus: 3.4 mg/dL (ref 2.5–4.6)

## 2018-10-21 LAB — MAGNESIUM: Magnesium: 1.9 mg/dL (ref 1.7–2.4)

## 2018-10-21 MED ORDER — FUROSEMIDE 10 MG/ML IJ SOLN
60.0000 mg | Freq: Two times a day (BID) | INTRAMUSCULAR | Status: DC
  Administered 2018-10-21 – 2018-10-23 (×6): 60 mg via INTRAVENOUS
  Filled 2018-10-21 (×6): qty 8

## 2018-10-21 MED ORDER — METHYLPREDNISOLONE SODIUM SUCC 40 MG IJ SOLR
20.0000 mg | Freq: Two times a day (BID) | INTRAMUSCULAR | Status: DC
  Administered 2018-10-21 – 2018-10-23 (×4): 20 mg via INTRAVENOUS
  Filled 2018-10-21 (×4): qty 1

## 2018-10-21 MED ORDER — FUROSEMIDE 10 MG/ML IJ SOLN
40.0000 mg | Freq: Once | INTRAMUSCULAR | Status: AC
  Administered 2018-10-21: 40 mg via INTRAVENOUS
  Filled 2018-10-21: qty 4

## 2018-10-21 NOTE — Consult Note (Signed)
PHARMACY CONSULT NOTE - FOLLOW UP  Pharmacy Consult for Electrolyte Monitoring and Replacement   Recent Labs: Potassium (mmol/L)  Date Value  10/21/2018 4.1   Magnesium (mg/dL)  Date Value  10/21/2018 1.9   Calcium (mg/dL)  Date Value  10/21/2018 8.9   Albumin (g/dL)  Date Value  10/21/2018 3.3 (L)   Phosphorus (mg/dL)  Date Value  10/21/2018 3.4  10/21/2018 3.4   Sodium (mmol/L)  Date Value  10/21/2018 139   Patient received potassium 50mEq and furosemide 40mg  IV on 9/12. No replacement warranted.   Will obtain electrolytes with am labs.   Will replace to maintain electrolytes within normal limits.   Pharmacy will continue to monitor and adjust per consult.   Simpson,Michael L, 10/21/2018 8:58 AM

## 2018-10-21 NOTE — Progress Notes (Signed)
ID Has sob and cough Wheezing, says better than yesterday No fever  Patient Vitals for the past 24 hrs:  BP Temp Temp src Pulse Resp SpO2  10/21/18 1612 - - - - - 90 %  10/21/18 1554 (!) 144/81 97.9 F (36.6 C) - 89 20 92 %  10/21/18 0832 (!) 170/91 98.1 F (36.7 C) Oral 95 (!) 21 90 %  10/21/18 0344 - - - - - 93 %  10/20/18 2308 (!) 148/78 98 F (36.7 C) - 79 18 94 %  10/20/18 2100 - - - - - 94 %  10/20/18 1910 - - - - - 92 %  10/20/18 1900 - - - - - (!) 82 %  10/20/18 1830 - - - - - (!) 84 %    Awake and alert chet b/l air entry B/l rhonchi [id number] Abd soft  CBC Latest Ref Rng & Units 10/21/2018 10/19/2018 10/17/2018  WBC 4.0 - 10.5 K/uL 9.7 8.0 11.2(H)  Hemoglobin 13.0 - 17.0 g/dL 9.7(L) 9.9(L) 9.6(L)  Hematocrit 39.0 - 52.0 % 30.9(L) 32.0(L) 32.1(L)  Platelets 150 - 400 K/uL 126(L) 109(L) 108(L)   CMP Latest Ref Rng & Units 10/21/2018 10/20/2018 10/19/2018  Glucose 70 - 99 mg/dL 104(H) 91 97  BUN 8 - 23 mg/dL 39(H) 33(H) 31(H)  Creatinine 0.61 - 1.24 mg/dL 3.72(H) 3.54(H) 3.45(H)  Sodium 135 - 145 mmol/L 139 140 142  Potassium 3.5 - 5.1 mmol/L 4.1 3.5 3.3(L)  Chloride 98 - 111 mmol/L 105 108 112(H)  CO2 22 - 32 mmol/L 23 24 23   Calcium 8.9 - 10.3 mg/dL 8.9 8.5(L) 8.5(L)  Total Protein 6.5 - 8.1 g/dL - - -  Total Bilirubin 0.3 - 1.2 mg/dL - - -  Alkaline Phos 38 - 126 U/L - - -  AST 15 - 41 U/L - - -  ALT 0 - 44 U/L - - -   ABG- 65 PO2 BC 8/8 Kleb oxytoca 9/13 BC- sent UC Kleb oxy  CXR    Assessment /plan  Klebsiella oxytoca bacteremia and urosepsis- on ceftriaxone IV to complete 14 days. Living donor renal transplant on cellcept, prograf and prednisone  SOB/hypoxia - pulmonary edema-on lasix ? copd HTN- was very high yesterday- management as per primary team  AKI on CKD- nephrologist on board   Acute metabolic encephalopathy: seen by neurologist- CT no acute findings EEg pending  APS- with h/o DVT, TIA, CVA  Discussed the management with patient

## 2018-10-21 NOTE — Consult Note (Signed)
  ANTICOAGULATION CONSULT NOTE - Initial Consult  Pharmacy Consult for Warfarin Dosing and Monitoring Indication: VTE Treatment   Allergies  Allergen Reactions  . Losartan Cough  . Statins Nausea And Vomiting and Other (See Comments)    Reaction:  Muscle pain    Patient Measurements: Height: 5\' 3"  (160 cm) Weight: 128 lb (58.1 kg) IBW/kg (Calculated) : 56.9  Vital Signs: Temp: 98.1 F (36.7 C) (09/13 0832) Temp Source: Oral (09/13 0832) BP: 170/91 (09/13 0832) Pulse Rate: 95 (09/13 0832)  Labs: Recent Labs    10/19/18 0619 10/20/18 0352 10/21/18 0038  HGB 9.9*  --  9.7*  HCT 32.0*  --  30.9*  PLT 109*  --  126*  LABPROT 27.2* 31.1* 35.3*  INR 2.6* 3.1* 3.6*  CREATININE 3.45* 3.54* 3.72*    Estimated Creatinine Clearance: 14.9 mL/min (A) (by C-G formula based on SCr of 3.72 mg/dL (H)).   Medical History: Past Medical History:  Diagnosis Date  . Anemia   . Antiphospholipid antibody syndrome ([place])   . Arthritis   . DVT (deep venous thrombosis) ([place])   . Essential hypertension   . Gastric ulcer   . GERD (gastroesophageal reflux disease)   . History of CMV   . Hyperlipidemia   . Hyperparathyroidism ([place])   . Pedal edema   . Peripheral vascular disease ([place])   . Rosacea   . Status post kidney transplant 2001  . Stroke ([place])   . TIA (transient ischemic attack)   . Vascular dementia without behavioral disturbance [facility])    Assessment: Pharmacy consulted for warfarin dosing and monitoring in 68  yo male with PMH of DVT. Patient has been admitted with UTI and Acute Renal Failure on CKD w/ PMH of kidney transplant. INR therapeutic on admission. Per wife, prefer to keep pt between INR 2.5 and 3.0 d/t APS and PMH of strokes.  Home Regimen: Warfarin 3mg : Mon, Wed, Fri                              Warfarin 2mg : Tues, Thurs, Sat, Sun  Goal of Therapy:  INR 2-3 Monitor platelets by anticoagulation protocol: Yes   Plan:  INR minimally elevated. Will hold today's  dose and obtain INR with am labs.   Plan for INRs daily per protocol while receiving antibiotics. CBC at least every 3 days per protocol.  Pharmacy will continue to follow and order dose based on levels.   Johnathin Vanderschaaf L, RPh 10/21/2018 8:56 AM

## 2018-10-21 NOTE — Progress Notes (Signed)
[place] [facility] [place] NAME: Eric Gill    MR#:  [id number]  DATE OF BIRTH:  12/04/1950  SUBJECTIVE:  CHIEF COMPLAINT:   Chief Complaint  Patient presents with  . Urinary Frequency  . Weakness   No new complaint this morning.  Patient however still short of breath and requiring up to 6 L this morning.  Chest x-ray done this morning with evidence of pulmonary edema.  Being diuresed with IV Lasix.  REVIEW OF SYSTEMS:  Review of Systems  Constitutional: Negative for chills, fever and malaise/fatigue.  HENT: Negative for sore throat.   Eyes: Negative for blurred vision and double vision.  Respiratory: Negative for cough, hemoptysis, shortness of breath, wheezing and stridor.   Cardiovascular: Negative for chest pain, palpitations, orthopnea and leg swelling.  Gastrointestinal: Negative for abdominal pain, blood in stool, diarrhea, melena, nausea and vomiting.  Genitourinary: Negative for dysuria, flank pain and hematuria.  Musculoskeletal: Negative for back pain and joint pain.  Skin: Negative for rash.  Neurological: Negative for dizziness, sensory change, focal weakness, seizures, loss of consciousness, weakness and headaches.  Endo/Heme/Allergies: Negative for polydipsia.  Psychiatric/Behavioral: Negative for depression. The patient is not nervous/anxious.     DRUG ALLERGIES:   Allergies  Allergen Reactions  . Losartan Cough  . Statins Nausea And Vomiting and Other (See Comments)    Reaction:  Muscle pain    VITALS:  Blood pressure (!) 170/91, pulse 95, temperature 98.1 F (36.7 C), temperature source Oral, resp. rate (!) 21, height 5\' 3"  (1.6 m), weight 58.1 kg, SpO2 90 %. PHYSICAL EXAMINATION:  Physical Exam Constitutional:      General: He is not in acute distress. HENT:     Head: Normocephalic.     Mouth/Throat:     Mouth: Mucous membranes are moist.  Eyes:     General: No scleral icterus.    Conjunctiva/sclera:  Conjunctivae normal.     Pupils: Pupils are equal, round, and reactive to light.  Neck:     Musculoskeletal: Normal range of motion and neck supple.     Vascular: No JVD.     Trachea: No tracheal deviation.  Cardiovascular:     Rate and Rhythm: Normal rate and regular rhythm.     Heart sounds: Normal heart sounds. No murmur. No gallop.   Pulmonary:     Effort: Pulmonary effort is normal. No respiratory distress.     Breath sounds: Rales present.  Abdominal:     General: Bowel sounds are normal. There is no distension.     Palpations: Abdomen is soft.     Tenderness: There is no abdominal tenderness. There is no rebound.  Musculoskeletal: Normal range of motion.        General: No tenderness.     Right lower leg: No edema.     Left lower leg: No edema.  Skin:    Findings: No erythema or rash.  Neurological:     General: No focal deficit present.     Mental Status: He is alert.     Cranial Nerves: No cranial nerve deficit.     Comments: Confused  Psychiatric:        Mood and Affect: Mood normal.    LABORATORY PANEL:  Male CBC Recent Labs  Lab 10/21/18 0038  WBC 9.7  HGB 9.7*  HCT 30.9*  PLT 126*   ------------------------------------------------------------------------------------------------------------------ Chemistries  Recent Labs  Lab 10/21/18 0038  NA 139  K 4.1  CL 105  CO2 23  GLUCOSE 104*  BUN 39*  CREATININE 3.72*  CALCIUM 8.9  MG 1.9   RADIOLOGY:  Dg Chest 1 View  Result Date: 10/21/2018 CLINICAL DATA:  Dyspnea. EXAM: CHEST  1 VIEW COMPARISON:  10/20/2018 FINDINGS: Lungs are adequately inflated with persistent bilateral perihilar bibasilar opacification likely mild vascular congestion with small bilateral pleural effusions and associated atelectasis. Infection is less likely. Cardiomediastinal silhouette and remainder of the exam is unchanged. IMPRESSION: Persistent bilateral perihilar bibasilar opacification likely vascular congestion with small  bilateral pleural effusions and associated basilar atelectasis. Infection less likely. Electronically Signed   By: Marin Olp M.D.   On: 10/21/2018 09:36   Dg Chest 1 View  Result Date: 10/20/2018 CLINICAL DATA:  Patient with shortness of breath. EXAM: CHEST  1 VIEW COMPARISON:  Chest radiograph 10/19/2018 FINDINGS: Stable enlarged cardiac and mediastinal contours. Small layering bilateral pleural effusions with underlying consolidation. Bilateral interstitial opacities. No pneumothorax. IMPRESSION: Cardiomegaly. Small layering pleural effusions with underlying opacities which may represent atelectasis, edema or infection. Pulmonary vascular redistribution. Electronically Signed   By: Lovey Newcomer M.D.   On: 10/20/2018 15:19   Ct Head Wo Contrast  Result Date: 10/20/2018 CLINICAL DATA:  68 year old male with altered mental status of unclear etiology. EXAM: CT HEAD WITHOUT CONTRAST TECHNIQUE: Contiguous axial images were obtained from the base of the skull through the vertex without intravenous contrast. COMPARISON:  Head CT 11/13/2016 and brain MRI 11/16/2016. FINDINGS: Brain: New confluent hypodensity in the left occipital pole since 2018 corresponding to the left PCA territory infarct at that time. Underlying extensive chronic heterogeneity in the bilateral deep gray matter nuclei. Patchy bilateral cerebral white matter hypodensity appears stable. No midline shift, ventriculomegaly, mass effect, evidence of mass lesion, intracranial hemorrhage or evidence of cortically based acute infarction. A small area of right frontal operculum cortical encephalomalacia is also stable. Vascular: Calcified atherosclerosis at the skull base. No suspicious intracranial vascular hyperdensity. Skull: Osteopenia.  No acute osseous abnormality identified. Sinuses/Orbits: Chronic paranasal sinus inflammation with increased left sphenoid opacification since 2018. Stable left frontoethmoidal recess involvement. Tympanic  cavities and mastoids remain clear. Other: Negative orbit and scalp soft tissues. IMPRESSION: 1.  No acute intracranial abnormality. 2. Stable appearance of advanced chronic ischemic disease since 2018, with expected evolution of the left PCA territory infarct since that time. 3. Chronic paranasal sinus inflammation. Electronically Signed   By: Genevie Ann M.D.   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 Nephrologist following.  IV fluids previously discontinued due to pulmonary edema. Patient now being diuresed with IV Lasix. Continue mycophenolate, tacrolimus and prednisone per Dr.Kolluru.  HTN emergency Blood pressure better controlled this morning.  Continue HTN meds except lasix. Iv hydralazine prn.  UTI and bacteremia.  U/C: No growth.  Blood culture: Klebsiella oxytoca. Cefepime is discontinued, changed to Rocephin and IV antibiotics for total 10 days per Dr. Steva Ready.  Acute metabolic encephalopathy Multifactorial due to ongoing infectious process and hypoxia Nephrologist recommended neurology consult.  Neurologist recommended CT head which was negative for any acute findings.  Follow-up on vitamin B1 and B12 level.  Follow-up on EEG when read.  H/O DVT, PVD, CVA, coumadin PTD.  INR 3.6 Pharmacist assisting with dosing Coumadin. Anemia of chronic disease.  Stable.  Hypokalemia.  Replaced  Hypophosphatemia.  Phosphate supplement.  Acute hypoxic respiratory failure  Secondary to pulmonary edema.  IV fluids already discontinued.   Patient requiring  6 L of oxygen via nasal cannula at this point.  Reviewed chest x-ray and ABG results.   Continue diuresis with IV Lasix.   2D echocardiogram to evaluate cardiac function   DVT prophylaxis ; patient on Coumadin   all the records are reviewed and case discussed with Care Management/Social Worker. Management plans discussed with the patient, and he is in in agreement.  CODE STATUS:  Full Code  TOTAL TIME TAKING CARE OF THIS PATIENT: 36 minutes.   More than 50% of the time was spent in counseling/coordination of care: YES  POSSIBLE D/C IN 3 DAYS, DEPENDING ON CLINICAL CONDITION.   Icyss Skog M.D on 10/21/2018 at 12:02 PM  Between 7am to 6pm - Pager - [phone]  After 6pm go to [email or web address] - [facility]

## 2018-10-21 NOTE — Progress Notes (Signed)
[facility]  ROUNDING NOTE   Subjective:   Mental status seems to have improved since yesterday.   Creatinine 3.72 (3.54)  Objective:  Vital signs in last 24 hours:  Temp:  [98 F (36.7 C)-99 F (37.2 C)] 98.1 F (36.7 C) (09/13 0832) Pulse Rate:  [79-95] 95 (09/13 0832) Resp:  [18-21] 21 (09/13 0832) BP: (148-170)/(69-91) 170/91 (09/13 0832) SpO2:  [82 %-94 %] 90 % (09/13 0832)  Weight change:  Filed Weights   10/15/18 2309  Weight: 58.1 kg    Intake/Output: I/O last 3 completed shifts: In: 340.1 [P.O.:240; IV Piggyback:100.1] Out: [id number] [Urine:1225]   Intake/Output this shift:  No intake/output data recorded.  Physical Exam: General: NAD, laying in bed  Head: Normocephalic, atraumatic. Moist oral mucosal membranes  Eyes: Anicteric, PERRL  Neck: Supple, trachea midline  Lungs:  Clear to auscultation, 2L [place]  Heart: Regular rate and rhythm  Abdomen:  Soft, nontender,   Extremities: no peripheral edema.  Neurologic: Nonfocal, moving all four extremities  Skin: No lesions   GU Condom catheter with yellow urine    Basic Metabolic Panel: Recent Labs  Lab 10/17/18 0328 10/18/18 0642 10/19/18 0619 10/19/18 1432 10/20/18 0352 10/21/18 0038  NA 140 143 142  --  140 139  K 3.5 3.9 3.3*  --  3.5 4.1  CL 114* 117* 112*  --  108 105  CO2 19* 17* 23  --  24 23  GLUCOSE 92 93 97  --  91 104*  BUN 38* 34* 31*  --  33* 39*  CREATININE 3.71* 3.86* 3.45*  --  3.54* 3.72*  CALCIUM 8.4* 8.7* 8.5*  --  8.5* 8.9  MG  --   --   --  1.9 1.9 1.9  PHOS  --   --  1.6*  --  3.4 3.4  3.4    Liver Function Tests: Recent Labs  Lab 10/19/18 0619 10/21/18 0038  ALBUMIN 3.1* 3.3*   No results for input(s): LIPASE, AMYLASE in the last 168 hours. No results for input(s): AMMONIA in the last 168 hours.  CBC: Recent Labs  Lab 10/15/18 2324 10/17/18 0328 10/19/18 0619 10/21/18 0038  WBC 8.7 11.2* 8.0 9.7  HGB 11.5* 9.6* 9.9* 9.7*  HCT 36.8* 32.1* 32.0*  30.9*  MCV 92.9 94.7 91.4 91.7  PLT 146* 108* 109* 126*    Cardiac Enzymes: No results for input(s): CKTOTAL, CKMB, CKMBINDEX, TROPONINI in the last 168 hours.  BNP: Invalid input(s): POCBNP  CBG: No results for input(s): GLUCAP in the last 168 hours.  Microbiology: Results for orders placed or performed during the hospital encounter of 10/16/18  Urine culture     Status: None   Collection Time: 10/16/18  2:26 AM   Specimen: Urine, Random  Result Value Ref Range Status   Specimen Description   Final    URINE, RANDOM Performed at [facility], [address], [place], [place] [address]    Special Requests   Final    NONE Performed at [facility], [address], [place], [place] [address]    Culture   Final    NO GROWTH Performed at [facility], [place] [address] [place], [place] [address]    Report Status 10/17/2018 FINAL  Final  Blood culture (routine x 2)     Status: None   Collection Time: 10/16/18  2:26 AM   Specimen: BLOOD  Result Value Ref Range Status   Specimen Description BLOOD LEFT FA  Final   Special Requests   Final    BOTTLES DRAWN AEROBIC AND ANAEROBIC Blood Culture adequate volume   Culture   Final    NO GROWTH 5 DAYS Performed at [facility], [address] [place], [place] [address]    Report Status 10/21/2018 FINAL  Final  Blood culture (routine x 2)     Status: Abnormal   Collection Time: 10/16/18  2:26 AM   Specimen: BLOOD  Result Value Ref Range Status   Specimen Description   Final    BLOOD LEFT FA Performed at [facility], [address] [place], [place] [address]    Special Requests   Final    BOTTLES DRAWN AEROBIC AND ANAEROBIC Blood Culture adequate volume Performed at [facility], [address] [place], [place] [address]    Culture  Setup Time   Final    ANAEROBIC BOTTLE ONLY GRAM NEGATIVE RODS CRITICAL RESULT CALLED TO, READ BACK BY AND VERIFIED WITH: Dustin Folks AT  [id number] ON 10/16/2018 BY JPM Performed at [facility], [place] [address], [place], [place] [address]    Culture KLEBSIELLA OXYTOCA (A)  Final   Report Status 10/18/2018 FINAL  Final   Organism ID, Bacteria KLEBSIELLA OXYTOCA  Final      Susceptibility   Klebsiella oxytoca - MIC*    AMPICILLIN >=32 RESISTANT Resistant     CEFAZOLIN <=4 SENSITIVE Sensitive     CEFEPIME <=1 SENSITIVE Sensitive     CEFTAZIDIME <=1 SENSITIVE Sensitive     CEFTRIAXONE <=1 SENSITIVE Sensitive     CIPROFLOXACIN <=0.25 SENSITIVE Sensitive     GENTAMICIN <=1 SENSITIVE Sensitive     IMIPENEM <=0.25 SENSITIVE Sensitive     TRIMETH/SULFA <=20 SENSITIVE Sensitive     AMPICILLIN/SULBACTAM 8 SENSITIVE Sensitive     PIP/TAZO <=4 SENSITIVE Sensitive     * KLEBSIELLA OXYTOCA  SARS Coronavirus 2 [facility] order, Performed in [facility] hospital lab) Nasopharyngeal Nasopharyngeal Swab     Status: None   Collection Time: 10/16/18  2:26 AM   Specimen: Nasopharyngeal Swab  Result Value Ref Range Status   SARS Coronavirus 2 NEGATIVE NEGATIVE Final    Comment: (NOTE) If result is NEGATIVE SARS-CoV-2 target nucleic acids are NOT DETECTED. The SARS-CoV-2 RNA is generally detectable in upper and lower  respiratory specimens during the acute phase of infection. The lowest  concentration of SARS-CoV-2 viral copies this assay can detect is 250  copies / mL. A negative result does not preclude SARS-CoV-2 infection  and should not be used as the sole basis for treatment or other  patient management decisions.  A negative result may occur with  improper specimen collection / handling, submission of specimen other  than nasopharyngeal swab, presence of viral mutation(s) within the  areas targeted by this assay, and inadequate number of viral copies  (<250 copies / mL). A negative result must be combined with clinical  observations, patient history, and epidemiological information. If result is POSITIVE SARS-CoV-2 target nucleic  acids are DETECTED. The SARS-CoV-2 RNA is generally detectable in upper and lower  respiratory specimens dur ing the acute phase of infection.  Positive  results are indicative of active infection with SARS-CoV-2.  Clinical  correlation with patient history and other diagnostic information is  necessary to determine patient infection status.  Positive results do  not rule out bacterial infection or co-infection with other viruses. If result is PRESUMPTIVE POSTIVE SARS-CoV-2 nucleic acids MAY BE PRESENT.   A presumptive  positive result was obtained on the submitted specimen  and confirmed on repeat testing.  While 2019 novel coronavirus  (SARS-CoV-2) nucleic acids may be present in the submitted sample  additional confirmatory testing may be necessary for epidemiological  and / or clinical management purposes  to differentiate between  SARS-CoV-2 and other Sarbecovirus currently known to infect humans.  If clinically indicated additional testing with an alternate test  methodology [phone]) is advised. The SARS-CoV-2 RNA is generally  detectable in upper and lower respiratory sp ecimens during the acute  phase of infection. The expected result is Negative. Fact Sheet for Patients:  StrictlyIdeas.no Fact Sheet for Healthcare Providers: BankingDealers.co.za This test is not yet approved or cleared by the [place] FDA and has been authorized for detection and/or diagnosis of SARS-CoV-2 by FDA under an Emergency Use Authorization (EUA).  This EUA will remain in effect (meaning this test can be used) for the duration of the COVID-19 declaration under Section 564(b)(1) of the Act, 21 U.S.C. section 360bbb-3(b)(1), unless the authorization is terminated or revoked sooner. Performed at [facility], [address] [place], [place] [address]   Blood Culture ID Panel (Reflexed)     Status: Abnormal   Collection Time: 10/16/18   2:26 AM  Result Value Ref Range Status   Enterococcus species NOT DETECTED NOT DETECTED Final   Listeria monocytogenes NOT DETECTED NOT DETECTED Final   Staphylococcus species NOT DETECTED NOT DETECTED Final   Staphylococcus aureus (BCID) NOT DETECTED NOT DETECTED Final   Streptococcus species NOT DETECTED NOT DETECTED Final   Streptococcus agalactiae NOT DETECTED NOT DETECTED Final   Streptococcus pneumoniae NOT DETECTED NOT DETECTED Final   Streptococcus pyogenes NOT DETECTED NOT DETECTED Final   Acinetobacter baumannii NOT DETECTED NOT DETECTED Final   Enterobacteriaceae species DETECTED (A) NOT DETECTED Final    Comment: Enterobacteriaceae represent a large family of gram-negative bacteria, not a single organism. CRITICAL RESULT CALLED TO, READ BACK BY AND VERIFIED WITH: ELLINGTON,A AT [id number] ON 10/16/2018 BY JPM    Enterobacter cloacae complex NOT DETECTED NOT DETECTED Final   Escherichia coli NOT DETECTED NOT DETECTED Final   Klebsiella oxytoca DETECTED (A) NOT DETECTED Final    Comment: CRITICAL RESULT CALLED TO, READ BACK BY AND VERIFIED WITH: ELLINGTON,A AT 1747 ON 10/16/2018 BY JPM    Klebsiella pneumoniae NOT DETECTED NOT DETECTED Final   Proteus species NOT DETECTED NOT DETECTED Final   Serratia marcescens NOT DETECTED NOT DETECTED Final   Carbapenem resistance NOT DETECTED NOT DETECTED Final   Haemophilus influenzae NOT DETECTED NOT DETECTED Final   Neisseria meningitidis NOT DETECTED NOT DETECTED Final   Pseudomonas aeruginosa NOT DETECTED NOT DETECTED Final   Candida albicans NOT DETECTED NOT DETECTED Final   Candida glabrata NOT DETECTED NOT DETECTED Final   Candida krusei NOT DETECTED NOT DETECTED Final   Candida parapsilosis NOT DETECTED NOT DETECTED Final   Candida tropicalis NOT DETECTED NOT DETECTED Final    Comment: Performed at [facility], [place], [place] [address]  CULTURE, BLOOD (ROUTINE X 2) w Reflex to ID Panel     Status: None  (Preliminary result)   Collection Time: 10/21/18 12:38 AM   Specimen: BLOOD  Result Value Ref Range Status   Specimen Description BLOOD RIGHT ANTECUBITAL  Final   Special Requests   Final    BOTTLES DRAWN AEROBIC AND ANAEROBIC Blood Culture adequate volume   Culture   Final    NO GROWTH < 12 HOURS  Performed at [facility], [address] [place], [place] [address]    Report Status PENDING  Incomplete  CULTURE, BLOOD (ROUTINE X 2) w Reflex to ID Panel     Status: None (Preliminary result)   Collection Time: 10/21/18 12:38 AM   Specimen: BLOOD  Result Value Ref Range Status   Specimen Description BLOOD LEFT ANTECUBITAL  Final   Special Requests   Final    BOTTLES DRAWN AEROBIC AND ANAEROBIC Blood Culture adequate volume   Culture   Final    NO GROWTH < 12 HOURS Performed at [facility], [address], [place], [place] [address]    Report Status PENDING  Incomplete    Coagulation Studies: Recent Labs    10/19/18 0619 10/20/18 0352 10/21/18 0038  LABPROT 27.2* 31.1* 35.3*  INR 2.6* 3.1* 3.6*    Urinalysis: No results for input(s): COLORURINE, LABSPEC, PHURINE, GLUCOSEU, HGBUR, BILIRUBINUR, KETONESUR, PROTEINUR, UROBILINOGEN, NITRITE, LEUKOCYTESUR in the last 72 hours.  Invalid input(s): APPERANCEUR    Imaging: Dg Chest 1 View  Result Date: 10/21/2018 CLINICAL DATA:  Dyspnea. EXAM: CHEST  1 VIEW COMPARISON:  10/20/2018 FINDINGS: Lungs are adequately inflated with persistent bilateral perihilar bibasilar opacification likely mild vascular congestion with small bilateral pleural effusions and associated atelectasis. Infection is less likely. Cardiomediastinal silhouette and remainder of the exam is unchanged. IMPRESSION: Persistent bilateral perihilar bibasilar opacification likely vascular congestion with small bilateral pleural effusions and associated basilar atelectasis. Infection less likely. Electronically Signed   By: Marin Olp M.D.   On:  10/21/2018 09:36   Dg Chest 1 View  Result Date: 10/20/2018 CLINICAL DATA:  Patient with shortness of breath. EXAM: CHEST  1 VIEW COMPARISON:  Chest radiograph 10/19/2018 FINDINGS: Stable enlarged cardiac and mediastinal contours. Small layering bilateral pleural effusions with underlying consolidation. Bilateral interstitial opacities. No pneumothorax. IMPRESSION: Cardiomegaly. Small layering pleural effusions with underlying opacities which may represent atelectasis, edema or infection. Pulmonary vascular redistribution. Electronically Signed   By: Lovey Newcomer M.D.   On: 10/20/2018 15:19   Ct Head Wo Contrast  Result Date: 10/20/2018 CLINICAL DATA:  68 year old male with altered mental status of unclear etiology. EXAM: CT HEAD WITHOUT CONTRAST TECHNIQUE: Contiguous axial images were obtained from the base of the skull through the vertex without intravenous contrast. COMPARISON:  Head CT 11/13/2016 and brain MRI 11/16/2016. FINDINGS: Brain: New confluent hypodensity in the left occipital pole since 2018 corresponding to the left PCA territory infarct at that time. Underlying extensive chronic heterogeneity in the bilateral deep gray matter nuclei. Patchy bilateral cerebral white matter hypodensity appears stable. No midline shift, ventriculomegaly, mass effect, evidence of mass lesion, intracranial hemorrhage or evidence of cortically based acute infarction. A small area of right frontal operculum cortical encephalomalacia is also stable. Vascular: Calcified atherosclerosis at the skull base. No suspicious intracranial vascular hyperdensity. Skull: Osteopenia.  No acute osseous abnormality identified. Sinuses/Orbits: Chronic paranasal sinus inflammation with increased left sphenoid opacification since 2018. Stable left frontoethmoidal recess involvement. Tympanic cavities and mastoids remain clear. Other: Negative orbit and scalp soft tissues. IMPRESSION: 1.  No acute intracranial abnormality. 2. Stable  appearance of advanced chronic ischemic disease since 2018, with expected evolution of the left PCA territory infarct since that time. 3. Chronic paranasal sinus inflammation. Electronically Signed   By: Genevie Ann M.D.   On: 10/20/2018 18:16   Dg Chest Port 1 View  Result Date: 10/19/2018 CLINICAL DATA:  Hypoxia EXAM: PORTABLE CHEST 1 VIEW COMPARISON:  None. FINDINGS: 1320 hours.  Low lung volumes. Cardiopericardial silhouette is at upper limits of normal for size. Retrocardiac left base collapse/consolidation associated with small left pleural effusion. There is pulmonary vascular congestion without overt pulmonary edema. Minimal airspace opacity noted right base. The visualized bony structures of the thorax are intact. IMPRESSION: Low volume film with vascular congestion. Left base collapse/consolidation with small left pleural effusion. Electronically Signed   By: Misty Stanley M.D.   On: 10/19/2018 15:06     Medications:   . cefTRIAXone (ROCEPHIN)  IV 2 g (10/21/18 0834)   . amLODipine  10 mg Oral Daily  . buPROPion  300 mg Oral Daily  . calcitRIOL  0.25 mcg Oral QHS  . citalopram  20 mg Oral Daily  . dicyclomine  10 mg Oral TID AC  . donepezil  10 mg Oral QHS  . furosemide  60 mg Intravenous BID  . labetalol  200 mg Oral BID  . magnesium oxide  400 mg Oral BID  . mometasone-formoterol  2 puff Inhalation BID  . mycophenolate  250 mg Oral q morning - 10a   And  . mycophenolate  500 mg Oral QHS  . pantoprazole  40 mg Oral Daily  . tacrolimus  0.5 mg Oral BID  . Warfarin - Pharmacist Dosing Inpatient   Does not apply q1800   acetaminophen **OR** acetaminophen, albuterol, bisacodyl, diphenoxylate-atropine, hydrALAZINE, HYDROcodone-acetaminophen, ondansetron **OR** ondansetron (ZOFRAN) IV  Assessment/ Plan:  [name] is a 68 y.o. 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 Acute renal failure with metabolic acidosis on Chronic kidney disease stage IVT Status post renal transplantation in 2001. His baseline creatinine is 2.8, GFR of 23 on 03/27/18. Acute renal failure seems secondary to prerenal azotemia and current infection.  Followed by Dr. Alger Simons, [facility]. - Continue mycophenolate, tacrolimus and prednisone. - Furosemide IV 40mg    2. Urinary tract infection: Klebsiella oxytoca.   - empric ceftriaxone. Suspect cefepime causing altered mental status.   2. Anemia of chronic kidney disease: hemoglobin 9.7 - Discussed epo with wife   3. Hypertension. Blood pressure Elevated.  - continueamlodipine, labetalol  4. Secondary hyperparathyroidism.  -Continuecalcitriol   LOS: 5 Eric Gill 9/13/202012:16 PM

## 2018-10-22 ENCOUNTER — Inpatient Hospital Stay (HOSPITAL_COMMUNITY)
Admit: 2018-10-22 | Discharge: 2018-10-22 | Disposition: A | Discharge status: Home / Self Care | Payer: Medicare Other | Attending: Internal Medicine | Admitting: Internal Medicine

## 2018-10-22 ENCOUNTER — Other Ambulatory Visit: Payer: Medicare Other

## 2018-10-22 DIAGNOSIS — I340 Nonrheumatic mitral (valve) insufficiency: Secondary | ICD-10-CM

## 2018-10-22 DIAGNOSIS — J441 Chronic obstructive pulmonary disease with (acute) exacerbation: Secondary | ICD-10-CM

## 2018-10-22 DIAGNOSIS — J811 Chronic pulmonary edema: Secondary | ICD-10-CM

## 2018-10-22 LAB — RENAL FUNCTION PANEL
Albumin: 3.4 g/dL — ABNORMAL LOW (ref 3.5–5.0)
Anion gap: 12 (ref 5–15)
BUN: 47 mg/dL — ABNORMAL HIGH (ref 8–23)
CO2: 25 mmol/L (ref 22–32)
Calcium: 9.2 mg/dL (ref 8.9–10.3)
Chloride: 103 mmol/L (ref 98–111)
Creatinine, Ser: 3.88 mg/dL — ABNORMAL HIGH (ref 0.61–1.24)
GFR calc Af Amer: 17 mL/min — ABNORMAL LOW (ref >60)
GFR calc non Af Amer: 15 mL/min — ABNORMAL LOW (ref >60)
Glucose, Bld: 134 mg/dL — ABNORMAL HIGH (ref 70–99)
Phosphorus: 3.7 mg/dL (ref 2.5–4.6)
Potassium: 4 mmol/L (ref 3.5–5.1)
Sodium: 140 mmol/L (ref 135–145)

## 2018-10-22 LAB — CBC
HCT: 32.8 % — ABNORMAL LOW (ref 39.0–52.0)
Hemoglobin: 10.3 g/dL — ABNORMAL LOW (ref 13.0–17.0)
MCH: 28.4 pg (ref 26.0–34.0)
MCHC: 31.4 g/dL (ref 30.0–36.0)
MCV: 90.4 fL (ref 80.0–100.0)
Platelets: 146 10*3/uL — ABNORMAL LOW (ref 150–400)
RBC: 3.63 MIL/uL — ABNORMAL LOW (ref 4.22–5.81)
RDW: 13.3 % (ref 11.5–15.5)
WBC: 5 10*3/uL (ref 4.0–10.5)
nRBC: 0 % (ref 0.0–0.2)

## 2018-10-22 LAB — ECHOCARDIOGRAM COMPLETE

## 2018-10-22 LAB — PROTIME-INR
INR: 4.8 (ref 0.8–1.2)
Prothrombin Time: 44.2 seconds — ABNORMAL HIGH (ref 11.4–15.2)

## 2018-10-22 NOTE — Consult Note (Signed)
PHARMACY CONSULT NOTE - FOLLOW UP  Pharmacy Consult for Electrolyte Monitoring and Replacement   Recent Labs: Potassium (mmol/L)  Date Value  10/22/2018 4.0   Magnesium (mg/dL)  Date Value  10/21/2018 1.9   Calcium (mg/dL)  Date Value  10/22/2018 9.2   Albumin (g/dL)  Date Value  10/22/2018 3.4 (L)   Phosphorus (mg/dL)  Date Value  10/22/2018 3.7   Sodium (mmol/L)  Date Value  10/22/2018 140   [name] receiving furosemide 60mg  IV BID (additional 40 mg dose 9/13)   No replacement warranted  Will obtain electrolytes with am labs.   Will replace to maintain electrolytes within normal limits.   Pharmacy will continue to monitor and adjust per consult.   Dallie Piles, PharmD 10/22/2018 8:20 AM

## 2018-10-22 NOTE — Progress Notes (Signed)
*  PRELIMINARY RESULTS* Echocardiogram 2D Echocardiogram has been performed.  Eric Gill 10/22/2018, 8:51 AM

## 2018-10-22 NOTE — Care Management Important Message (Signed)
Important Message  Patient Details  Name: TYRICE QUEENER MRN: [id number] Date of Birth: 04/30/1950   Medicare Important Message Given:  Yes     Juliann Pulse A Fred Hammes 10/22/2018, 11:33 AM

## 2018-10-22 NOTE — Progress Notes (Signed)
Date of Admission:  10/16/2018       Subjective: [name] doing better. Confusion has resolved Shortness of breath is much improved    Medications:  . amLODipine  10 mg Oral Daily  . buPROPion  300 mg Oral Daily  . calcitRIOL  0.25 mcg Oral QHS  . citalopram  20 mg Oral Daily  . dicyclomine  10 mg Oral TID AC  . donepezil  10 mg Oral QHS  . furosemide  60 mg Intravenous BID  . labetalol  200 mg Oral BID  . magnesium oxide  400 mg Oral BID  . methylPREDNISolone (SOLU-MEDROL) injection  20 mg Intravenous Q12H  . mometasone-formoterol  2 puff Inhalation BID  . mycophenolate  250 mg Oral q morning - 10a   And  . mycophenolate  500 mg Oral QHS  . pantoprazole  40 mg Oral Daily  . tacrolimus  0.5 mg Oral BID  . Warfarin - Pharmacist Dosing Inpatient   Does not apply q1800    Objective: Vital signs in last 24 hours: Temp:  [98 F (36.7 C)-98.4 F (36.9 C)] 98 F (36.7 C) (09/14 1532) Pulse Rate:  [88-92] 90 (09/14 1532) Resp:  [17-20] 17 (09/14 1532) BP: (157-174)/(79-89) 160/80 (09/14 1532) SpO2:  [89 %-99 %] 94 % (09/14 1532)  PHYSICAL EXAM:  General: Alert, cooperative, frail Head: Normocephalic, without obvious abnormality, atraumatic. Eyes: Conjunctivae clear, anicteric sclerae. Pupils are equal ENT Nares normal. No drainage or sinus tenderness. Lips, mucosa, and tongue normal. No Thrush Neck: Supple, symmetrical, no adenopathy, thyroid: non tender no carotid bruit and no JVD. Back: No CVA tenderness. Lungs: Bilateral air entry.  Rhonchi present.  Crepitation bases Heart: S1-S2 Abdomen: Soft, non-tender,not distended. Bowel sounds normal. No masses Extremities: atraumatic, no cyanosis. No edema. No clubbing Skin: No rashes or lesions. Or bruising Lymph: Cervical, supraclavicular normal. Neurologic: Grossly non-focal  Lab Results Recent Labs    10/21/18 0038 10/22/18 0329  WBC 9.7 5.0  HGB 9.7* 10.3*  HCT 30.9* 32.8*  NA 139 140  K 4.1 4.0  CL 105  103  CO2 23 25  BUN 39* 47*  CREATININE 3.72* 3.88*   Liver Panel Recent Labs    10/21/18 0038 10/22/18 0329  ALBUMIN 3.3* 3.4*   Sedimentation Rate No results for input(s): ESRSEDRATE in the last 72 hours. C-Reactive Protein No results for input(s): CRP in the last 72 hours.  Microbiology:  Studies/Results: Dg Chest 1 View  Result Date: 10/21/2018 CLINICAL DATA:  Dyspnea. EXAM: CHEST  1 VIEW COMPARISON:  10/20/2018 FINDINGS: Lungs are adequately inflated with persistent bilateral perihilar bibasilar opacification likely mild vascular congestion with small bilateral pleural effusions and associated atelectasis. Infection is less likely. Cardiomediastinal silhouette and remainder of the exam is unchanged. IMPRESSION: Persistent bilateral perihilar bibasilar opacification likely vascular congestion with small bilateral pleural effusions and associated basilar atelectasis. Infection less likely. Electronically Signed   By: Marin Olp M.D.   On: 10/21/2018 09:36     Assessment/Plan:  Klebsiella oxytoca bacteremia and urosepsis.  With underlying transplant kidney.  On ceftriaxone day 6 of antibiotic.  May need for another 6-8 days.   Shortness of breath hypoxia secondary to pulmonary edema on Lasix.  Also with COPD exacerbation for which he is on low-dose steroids.  Hypertension better controlled now  AKI on CKD.  Nephrologist on board  Acute metabolic encephalopathy has improved  Antiphospholipid antibody syndrome:.  History of TIA and CVAs.  Discussed the management with the patient  and his nurse.

## 2018-10-22 NOTE — Progress Notes (Signed)
[facility] [place] NAME: Eric Gill    MR#:  [id number]  DATE OF BIRTH:  November 13, 1950  SUBJECTIVE:  CHIEF COMPLAINT:   Chief Complaint  Patient presents with  . Urinary Frequency  . Weakness   No new complaint this morning.  Patient reports significant improvement in shortness of breath.  Weaning down oxygen requirement.  Being diuresed with IV Lasix for pulmonary edema   REVIEW OF SYSTEMS:  Review of Systems  Constitutional: Negative for chills, fever and malaise/fatigue.  HENT: Negative for sore throat.   Eyes: Negative for blurred vision and double vision.  Respiratory: Negative for cough, hemoptysis, shortness of breath, wheezing and stridor.   Cardiovascular: Negative for chest pain, palpitations, orthopnea and leg swelling.  Gastrointestinal: Negative for abdominal pain, blood in stool, diarrhea, melena, nausea and vomiting.  Genitourinary: Negative for dysuria, flank pain and hematuria.  Musculoskeletal: Negative for back pain and joint pain.  Skin: Negative for rash.  Neurological: Negative for dizziness, sensory change, focal weakness, seizures, loss of consciousness, weakness and headaches.  Endo/Heme/Allergies: Negative for polydipsia.  Psychiatric/Behavioral: Negative for depression. The patient is not nervous/anxious.     DRUG ALLERGIES:   Allergies  Allergen Reactions  . Losartan Cough  . Statins Nausea And Vomiting and Other (See Comments)    Reaction:  Muscle pain    VITALS:  Blood pressure (!) 160/80, pulse 90, temperature 98 F (36.7 C), temperature source Oral, resp. rate 17, height 5\' 3"  (1.6 m), weight 58.1 kg, SpO2 94 %. PHYSICAL EXAMINATION:  Physical Exam Constitutional:      General: He is not in acute distress. HENT:     Head: Normocephalic.     Mouth/Throat:     Mouth: Mucous membranes are moist.  Eyes:     General: No scleral icterus.    Conjunctiva/sclera: Conjunctivae normal.   Pupils: Pupils are equal, round, and reactive to light.  Neck:     Musculoskeletal: Normal range of motion and neck supple.     Vascular: No JVD.     Trachea: No tracheal deviation.  Cardiovascular:     Rate and Rhythm: Normal rate and regular rhythm.     Heart sounds: Normal heart sounds. No murmur. No gallop.   Pulmonary:     Effort: Pulmonary effort is normal. No respiratory distress.     Breath sounds: Rales present.  Abdominal:     General: Bowel sounds are normal. There is no distension.     Palpations: Abdomen is soft.     Tenderness: There is no abdominal tenderness. There is no rebound.  Musculoskeletal: Normal range of motion.        General: No tenderness.     Right lower leg: No edema.     Left lower leg: No edema.  Skin:    Findings: No erythema or rash.  Neurological:     General: No focal deficit present.     Mental Status: He is alert.     Cranial Nerves: No cranial nerve deficit.     Comments: Confused  Psychiatric:        Mood and Affect: Mood normal.    LABORATORY PANEL:  Male CBC Recent Labs  Lab 10/22/18 0329  WBC 5.0  HGB 10.3*  HCT 32.8*  PLT 146*   ------------------------------------------------------------------------------------------------------------------ Chemistries  Recent Labs  Lab 10/21/18 0038 10/22/18 0329  NA 139 140  K 4.1 4.0  CL 105 103  CO2 23 25  GLUCOSE 104* 134*  BUN 39* 47*  CREATININE 3.72* 3.88*  CALCIUM 8.9 9.2  MG 1.9  --    RADIOLOGY:  No results found. 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 Nephrologist following.  IV fluids previously discontinued due to pulmonary edema. Patient now being diuresed with IV Lasix. Continue mycophenolate, tacrolimus and prednisone per Dr.Kolluru.  HTN emergency Blood pressure better controlled this morning.  Continue HTN meds except lasix. Iv hydralazine prn.  UTI and bacteremia.  U/C: No growth.  Blood culture: Klebsiella oxytoca.  Cefepime is discontinued, changed to Rocephin and IV antibiotics for total 14 days per Dr. Elisabeth Cara.  Acute metabolic encephalopathy Multifactorial due to ongoing infectious process and hypoxia Improved Nephrologist recommended neurology consult.  Neurologist recommended CT head which was negative for any acute findings.  Follow-up on vitamin B1 and B12 level.  EEG done was a technically difficult study is within normal limits. No seizures or epileptiform discharges were seen throughout the recording.   H/O DVT, PVD, CVA, coumadin PTD.  INR 4.8. Pharmacist assisting with dosing Coumadin. Anemia of chronic disease.  Stable.  Hypokalemia.  Replaced  Hypophosphatemia.  Phosphate supplement.  Acute hypoxic respiratory failure  Secondary to pulmonary edema.  IV fluids already discontinued.   Patient requiring 6 L of oxygen via nasal cannula yesterday but now weaned down to 2 L this morning.  Reviewed chest x-ray and ABG results.   Continue diuresis with IV Lasix.   Patient noted to have evidence of mild COPD exacerbation.  Also started on IV steroids with clinical improvement 2D echocardiogram done with ejection fraction of 55 to 60%    DVT prophylaxis ; patient on Coumadin   all the records are reviewed and case discussed with Care Management/Social Worker. Management plans discussed with the patient, and he is in in agreement.  CODE STATUS: Full Code  TOTAL TIME TAKING CARE OF THIS PATIENT: 36 minutes.   More than 50% of the time was spent in counseling/coordination of care: YES  POSSIBLE D/C IN 3 DAYS, DEPENDING ON CLINICAL CONDITION.   Eric Gill M.D on 10/22/2018 at 4:35 PM  Between 7am to 6pm - Pager - [phone]  After 6pm go to [email or web address] - [facility]

## 2018-10-22 NOTE — Procedures (Signed)
Patient Name: Eric Gill  MRN: [id number]  Epilepsy Attending: Lora Havens  Referring Physician/Provider: Dr Alexis Goodell Date: 10/22/2018 Duration: 25.02 mins  Patient history: 68yo M with chronic left pca infarct and ams. EEG to evaluate for seizures.  Level of alertness: awake  AEDs during EEG study: None  Technical aspects: This EEG study was done with scalp electrodes positioned according to the 10-20 International system of electrode placement. Electrical activity was acquired at a sampling rate of 500Hz  and reviewed with a high frequency filter of 70Hz  and a low frequency filter of 1Hz . EEG data were recorded continuously and digitally stored.   DESCRIPTION: During awake state, no clear posterior dominant rhythm was seen.  This EEG was technically difficult due to significant myogenic artifact.  Physiologic photic driving was not seen during photic stimulation.  Hyperventilation was not performed.  IMPRESSION: This technically difficult study is within normal limits. No seizures or epileptiform discharges were seen throughout the recording.   However, only wakefulness was recorded and was marred with EMG artifact. If suspicion for interictal activity remains a concern, a prolonged study including sleep should be considered.    Eric Gill

## 2018-10-22 NOTE — Consult Note (Signed)
  ANTICOAGULATION CONSULT NOTE - Initial Consult  Pharmacy Consult for Warfarin Dosing and Monitoring Indication: VTE Treatment   Patient Measurements: Height: 5\' 3"  (160 cm) Weight: 128 lb (58.1 kg) IBW/kg (Calculated) : 56.9  Vital Signs: Temp: 98.2 F (36.8 C) (09/14 0723) Temp Source: Oral (09/13 2318) BP: 174/89 (09/14 0723) Pulse Rate: 92 (09/14 0723)  Labs: Recent Labs    10/20/18 0352 10/21/18 0038 10/22/18 0329  HGB  --  9.7* 10.3*  HCT  --  30.9* 32.8*  PLT  --  126* 146*  LABPROT 31.1* 35.3* 44.2*  INR 3.1* 3.6* 4.8*  CREATININE 3.54* 3.72* 3.88*    Estimated Creatinine Clearance: 14.3 mL/min (A) (by C-G formula based on SCr of 3.88 mg/dL (H)).   Medical History: Past Medical History:  Diagnosis Date  . Anemia   . Antiphospholipid antibody syndrome ([place])   . Arthritis   . DVT (deep venous thrombosis) ([place])   . Essential hypertension   . Gastric ulcer   . GERD (gastroesophageal reflux disease)   . History of CMV   . Hyperlipidemia   . Hyperparathyroidism ([place])   . Pedal edema   . Peripheral vascular disease ([place])   . Rosacea   . Status post kidney transplant 2001  . Stroke ([place])   . TIA (transient ischemic attack)   . Vascular dementia without behavioral disturbance [facility])    Assessment: Pharmacy consulted for warfarin dosing and monitoring in 68  yo male with PMH of DVT. Patient has been admitted with UTI and Acute Renal Failure on CKD w/ PMH of kidney transplant. INR therapeutic on admission. Per wife, prefer to keep pt between INR 2.5 and 3.0 d/t APS and PMH of strokes. H&H stable, PLT trending up  Home Regimen: Warfarin 3mg : Mon, Wed, Fri                              Warfarin 2mg : Tues, Thurs, Sat, Sun  Goal of Therapy:  INR 2-3 Monitor platelets by anticoagulation protocol: Yes   Plan:   INR elevated and still trending higher (last dose 9/12)  hold today's dose and obtain INR with am labs  INRs daily per protocol while receiving  antibiotic  CBC at least every 3 days per protocol.  Pharmacy will continue to follow and order dose based on levels.   Dallie Piles, PharmD 10/22/2018 8:28 AM

## 2018-10-22 NOTE — Plan of Care (Signed)

## 2018-10-22 NOTE — Progress Notes (Signed)
Physical Therapy Treatment Patient Details Name: Eric Gill MRN: [id number] DOB: 18-Sep-1950 Today's Date: 10/22/2018    History of Present Illness Pt admitted for renal failure with complaints of urinary frequency and weakness. History includes GERD, anemia, DVT, CVA, and vascular dementia.    PT Comments    Pt INR value was high today (4.8); to preserve safety, treatment was performed in bed. Upon entry, he reports minimal pain in his foot. Pt performs bed mobility activities with min assist. Tx began with strengthening exercises of the LE to address weakness; he was able to perform supine exercises with supervision. In seated, pt lost balance during LAQ exercise, requiring min assist to return to upright posture. Pt performed lateral scoot with minimal assist and heavy verbal curing. O2sat was monitored throughout the session and remained between 93 and 100 with 2 L/min of supplemental O2 via [place]. He was instructed to breathe with pursed lips during recovery periods. Eric Gill will continue to benefit from physical therapy addressing deficits in strength, balance, and activity tolerance.     Follow Up Recommendations  Home health PT;Supervision for mobility/OOB     Equipment Recommendations  Rolling walker with 5" wheels    Recommendations for Other Services       Precautions / Restrictions Precautions Precautions: Fall Restrictions Weight Bearing Restrictions: No    Mobility  Bed Mobility Overal bed mobility: Needs Assistance Bed Mobility: Supine to Sit     Supine to sit: Min assist     General bed mobility comments: Assist with leg and trunk  Transfers                 General transfer comment: (Deferred due to clincal concern)  Ambulation/Gait                 Stairs             Wheelchair Mobility    Modified Rankin (Stroke Patients Only)       Balance Overall balance assessment: Needs assistance Sitting-balance support: Feet  supported Sitting balance-Leahy Scale: Fair Sitting balance - Comments: (Pt. lost seated balance with single foot support on ground)                                    Cognition Arousal/Alertness: Awake/alert Behavior During Therapy: WFL for tasks assessed/performed Overall Cognitive Status: Within Functional Limits for tasks assessed                                 General Comments: Spoke with RN prior to treat regarding mental and respiratory status and INR      Exercises Other Exercises Other Exercises: supine ther-ex performed including B LE AP, QS, SLRs, hip abd/add, and SAQ. 12 reps with supervision.  Other Exercises: Seated ther-ex included marching and LAQ; 10 repetitions per leg(Loss of seated balance with LAQ; min assist req'd to correct) Other Exercises: Lateral scooting; pt required min assistance to perform and significant verbal cueing    General Comments General comments (skin integrity, edema, etc.): Due to abnormal INR pt performed bed exercises today      Pertinent Vitals/Pain Pain Assessment: 0-10 Pain Score: 3  Pain Location: R foot Pain Descriptors / Indicators: Dull;Discomfort Pain Intervention(s): Monitored during session    Home Living  Prior Function            PT Goals (current goals can now be found in the care plan section) Acute Rehab PT Goals Patient Stated Goal: to go home PT Goal Formulation: With patient Time For Goal Achievement: 10/31/18 Potential to Achieve Goals: Good Progress towards PT goals: Progressing toward goals(progressing slowly)    Frequency    Min 2X/week      PT Plan Current plan remains appropriate    Co-evaluation              AM-PAC PT "6 Clicks" Mobility   Outcome Measure  Help needed turning from your back to your side while in a flat bed without using bedrails?: None Help needed moving from lying on your back to sitting on the side of a  flat bed without using bedrails?: None Help needed moving to and from a bed to a chair (including a wheelchair)?: A Little Help needed standing up from a chair using your arms (e.g., wheelchair or bedside chair)?: A Little Help needed to walk in hospital room?: A Little Help needed climbing 3-5 steps with a railing? : A Little 6 Click Score: 20    End of Session Equipment Utilized During Treatment: Oxygen Activity Tolerance: Treatment limited secondary to medical complications (Comment)(INR) Patient left: in bed;with call bell/phone within reach;with bed alarm set Nurse Communication: Mobility status PT Visit Diagnosis: Muscle weakness (generalized) (M62.81);History of falling (Z91.81);Difficulty in walking, not elsewhere classified (R26.2);Pain;Unsteadiness on feet (R26.81) Pain - Right/Left: Right Pain - part of body: Ankle and joints of foot     Time: 1430-1454 PT Time Calculation (min) (ACUTE ONLY): 24 min  Charges:  $Therapeutic Exercise: 23-37 mins                     Delan Ksiazek, SPT   Lothar Prehn 10/22/2018, 4:03 PM

## 2018-10-22 NOTE — Progress Notes (Signed)
eeg complete.

## 2018-10-23 DIAGNOSIS — J9601 Acute respiratory failure with hypoxia: Secondary | ICD-10-CM

## 2018-10-23 LAB — CBC
HCT: 31.7 % — ABNORMAL LOW (ref 39.0–52.0)
Hemoglobin: 10 g/dL — ABNORMAL LOW (ref 13.0–17.0)
MCH: 28.4 pg (ref 26.0–34.0)
MCHC: 31.5 g/dL (ref 30.0–36.0)
MCV: 90.1 fL (ref 80.0–100.0)
Platelets: 177 10*3/uL (ref 150–400)
RBC: 3.52 MIL/uL — ABNORMAL LOW (ref 4.22–5.81)
RDW: 13 % (ref 11.5–15.5)
WBC: 7.9 10*3/uL (ref 4.0–10.5)
nRBC: 0 % (ref 0.0–0.2)

## 2018-10-23 LAB — BASIC METABOLIC PANEL
Anion gap: 12 (ref 5–15)
BUN: 56 mg/dL — ABNORMAL HIGH (ref 8–23)
CO2: 28 mmol/L (ref 22–32)
Calcium: 9.1 mg/dL (ref 8.9–10.3)
Chloride: 103 mmol/L (ref 98–111)
Creatinine, Ser: 3.69 mg/dL — ABNORMAL HIGH (ref 0.61–1.24)
GFR calc Af Amer: 18 mL/min — ABNORMAL LOW (ref >60)
GFR calc non Af Amer: 16 mL/min — ABNORMAL LOW (ref >60)
Glucose, Bld: 159 mg/dL — ABNORMAL HIGH (ref 70–99)
Potassium: 3.4 mmol/L — ABNORMAL LOW (ref 3.5–5.1)
Sodium: 143 mmol/L (ref 135–145)

## 2018-10-23 LAB — MAGNESIUM: Magnesium: 2 mg/dL (ref 1.7–2.4)

## 2018-10-23 LAB — PROTIME-INR
INR: 4 — ABNORMAL HIGH (ref 0.8–1.2)
Prothrombin Time: 38.2 seconds — ABNORMAL HIGH (ref 11.4–15.2)

## 2018-10-23 LAB — PHOSPHORUS: Phosphorus: 3 mg/dL (ref 2.5–4.6)

## 2018-10-23 MED ORDER — POTASSIUM CHLORIDE 20 MEQ PO PACK
20.0000 meq | PACK | Freq: Once | ORAL | Status: AC
  Administered 2018-10-23: 20 meq via ORAL
  Filled 2018-10-23: qty 1

## 2018-10-23 MED ORDER — POTASSIUM CHLORIDE CRYS ER 20 MEQ PO TBCR
20.0000 meq | EXTENDED_RELEASE_TABLET | Freq: Once | ORAL | Status: AC
  Administered 2018-10-23: 20 meq via ORAL
  Filled 2018-10-23: qty 1

## 2018-10-23 MED ORDER — PREDNISONE 10 MG PO TABS
5.0000 mg | ORAL_TABLET | Freq: Every day | ORAL | Status: DC
  Administered 2018-10-24 – 2018-10-30 (×7): 5 mg via ORAL
  Filled 2018-10-23 (×7): qty 1

## 2018-10-23 NOTE — Progress Notes (Signed)
[facility] at [place] NAME: Eric Gill    MR#:  [id number]  DATE OF BIRTH:  Jul 23, 1950  SUBJECTIVE:  CHIEF COMPLAINT:   Chief Complaint  Patient presents with  . Urinary Frequency  . Weakness  Patient feeling better INR still elevated.  His breathing is improved compared to yesterday    REVIEW OF SYSTEMS:  Review of Systems  Constitutional: Negative for chills, fever and malaise/fatigue.  HENT: Negative for sore throat.   Eyes: Negative for blurred vision and double vision.  Respiratory: Negative for cough, hemoptysis, shortness of breath, wheezing and stridor.   Cardiovascular: Negative for chest pain, palpitations, orthopnea and leg swelling.  Gastrointestinal: Negative for abdominal pain, blood in stool, diarrhea, melena, nausea and vomiting.  Genitourinary: Negative for dysuria, flank pain and hematuria.  Musculoskeletal: Negative for back pain and joint pain.  Skin: Negative for rash.  Neurological: Negative for dizziness, sensory change, focal weakness, seizures, loss of consciousness, weakness and headaches.  Endo/Heme/Allergies: Negative for polydipsia.  Psychiatric/Behavioral: Negative for depression. The patient is not nervous/anxious.     DRUG ALLERGIES:   Allergies  Allergen Reactions  . Losartan Cough  . Statins Nausea And Vomiting and Other (See Comments)    Reaction:  Muscle pain    VITALS:  Blood pressure 129/81, pulse 73, temperature 98.9 F (37.2 C), temperature source Oral, resp. rate 18, height 5\' 3"  (1.6 m), weight 58.1 kg, SpO2 91 %. PHYSICAL EXAMINATION:  Physical Exam Constitutional:      General: He is not in acute distress. HENT:     Head: Normocephalic.     Mouth/Throat:     Mouth: Mucous membranes are moist.  Eyes:     General: No scleral icterus.    Conjunctiva/sclera: Conjunctivae normal.     Pupils: Pupils are equal, round, and reactive to light.  Neck:     Musculoskeletal: Normal  range of motion and neck supple.     Vascular: No JVD.     Trachea: No tracheal deviation.  Cardiovascular:     Rate and Rhythm: Normal rate and regular rhythm.     Heart sounds: Normal heart sounds. No murmur. No gallop.   Pulmonary:     Effort: Pulmonary effort is normal. No respiratory distress.     Breath sounds: Rales present.  Abdominal:     General: Bowel sounds are normal. There is no distension.     Palpations: Abdomen is soft.     Tenderness: There is no abdominal tenderness. There is no rebound.  Musculoskeletal: Normal range of motion.        General: No tenderness.     Right lower leg: No edema.     Left lower leg: No edema.  Skin:    Findings: No erythema or rash.  Neurological:     General: No focal deficit present.     Mental Status: He is alert.     Cranial Nerves: No cranial nerve deficit.     Comments: Confused  Psychiatric:        Mood and Affect: Mood normal.    LABORATORY PANEL:  Male CBC Recent Labs  Lab 10/23/18 0417  WBC 7.9  HGB 10.0*  HCT 31.7*  PLT 177   ------------------------------------------------------------------------------------------------------------------ Chemistries  Recent Labs  Lab 10/23/18 0417  NA 143  K 3.4*  CL 103  CO2 28  GLUCOSE 159*  BUN 56*  CREATININE 3.69*  CALCIUM 9.1  MG 2.0   RADIOLOGY:  No results found. 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 Nephrologist following.  Patient now being diuresed with IV Lasix. Continue mycophenolate, tacrolimus and prednisone per Dr.Kolluru. Renal function stable  HTN emergency continue current regimen blood pressures under control   UTI and bacteremia.  U/C: No growth.  Blood culture: Klebsiella oxytoca. Cefepime is discontinued, changed to Rocephin and IV antibiotics for total 14 days per Dr. Elisabeth Cara.  Acute metabolic encephalopathy Multifactorial due to ongoing infectious process and hypoxia Improved Seen by neurology    H/O DVT, PVD, CVA, coumadin PTD.  INR 4.8. Pharmacist assisting with dosing Coumadin.  Anemia of chronic disease.  Stable.  Hypokalemia.  Replaced   Hypophosphatemia.  Phosphate supplement.  Acute hypoxic respiratory failure due to acute diastolic CHF Secondary to pulmonary edema.  IV fluids already discontinued.   [name] respiratory status is improved    DVT prophylaxis ; patient on Coumadin   all the records are reviewed and case discussed with Care Management/Social Worker. Management plans discussed with the patient, and he is in in agreement.  CODE STATUS: Full Code  TOTAL TIME TAKING CARE OF THIS PATIENT: 36 minutes.   More than 50% of the time was spent in counseling/coordination of care: YES  POSSIBLE D/C IN 3 DAYS, DEPENDING ON CLINICAL CONDITION.   Dustin Flock M.D on 10/23/2018 at 4:24 PM  Between 7am to 6pm - Pager - [phone]  After 6pm go to [email or web address] - [facility]

## 2018-10-23 NOTE — Progress Notes (Signed)
   Date of Admission:  10/16/2018       Subjective: Alert Oriented in place and person and time Has cough But better Shortness of breath better Medications:  . amLODipine  10 mg Oral Daily  . buPROPion  300 mg Oral Daily  . calcitRIOL  0.25 mcg Oral QHS  . citalopram  20 mg Oral Daily  . dicyclomine  10 mg Oral TID AC  . donepezil  10 mg Oral QHS  . furosemide  60 mg Intravenous BID  . labetalol  200 mg Oral BID  . magnesium oxide  400 mg Oral BID  . mometasone-formoterol  2 puff Inhalation BID  . mycophenolate  250 mg Oral q morning - 10a   And  . mycophenolate  500 mg Oral QHS  . pantoprazole  40 mg Oral Daily  . [START ON 10/24/2018] predniSONE  5 mg Oral Q breakfast  . tacrolimus  0.5 mg Oral BID  . Warfarin - Pharmacist Dosing Inpatient   Does not apply q1800    Objective: Vital signs in last 24 hours: Temp:  [98 F (36.7 C)-98.9 F (37.2 C)] 98.9 F (37.2 C) (09/15 1507) Pulse Rate:  [73-84] 73 (09/15 1507) Resp:  [18] 18 (09/14 2314) BP: (129-156)/(69-81) 129/81 (09/15 1507) SpO2:  [89 %-97 %] 94 % (09/15 1618)  PHYSICAL EXAM:  General: Alert, cooperative, no distress, appears stated age.  Minimal confusion, frail Head: Normocephalic, without obvious abnormality, atraumatic. Eyes: Conjunctivae clear, anicteric sclerae. Pupils are equal ENT Nares normal. No drainage or sinus tenderness. Lips, mucosa, and tongue normal. No Thrush Neck: Supple, symmetrical, no adenopathy, thyroid: non tender no carotid bruit and no JVD. Back: No CVA tenderness. Lungs: Bilateral rhonchi Heart: 1 S2 Abdomen: Soft, non-tender,not distended. Bowel sounds normal. No masses, lap scar Extremities: atraumatic, no cyanosis. No edema. No clubbing Skin: Thinning, discoloration, bruising Lymph: Cervical, supraclavicular normal. Neurologic: Grossly non-focal  Lab Results Recent Labs    10/22/18 0329 10/23/18 0417  WBC 5.0 7.9  HGB 10.3* 10.0*  HCT 32.8* 31.7*  NA 140 143  K 4.0  3.4*  CL 103 103  CO2 25 28  BUN 47* 56*  CREATININE 3.88* 3.69*   Liver Panel Recent Labs    10/21/18 0038 10/22/18 0329  ALBUMIN 3.3* 3.4*   Sedimentation Rate No results for input(s): ESRSEDRATE in the last 72 hours. C-Reactive Protein No results for input(s): CRP in the last 72 hours.  Microbiology: Blood culture Klebsiella oxytoca from 10/16/2018 10/21/2018 blood culture negative so far Studies/Results: Chest x-ray shows persistent bilateral perivascular bibasilar opacification due to vascular congestion.  Reviewed by me personally  Impression and recommendation Klebsiella oxytoca bacteremia and urosepsis.  With underlying transplant kidney.  On ceftriaxone day 7 of antibiotic.  Will need until 10/30/2018  Acute hypoxic respiratory failure secondary to pulmonary edema and underlying COPD exacerbation.  On Lasix.  IV Solu-Medrol has been discontinued.  AKI on CKD.  Nephrologist on board.  Lasix may be contributing to it as well  Acute metabolic encephalopathy has improved a lot   Antiphospholipid antibody syndrome.  History of TIA and CVAs on Coumadin.  Discussed the management with the patient.

## 2018-10-23 NOTE — Consult Note (Signed)
  ANTICOAGULATION CONSULT NOTE - Initial Consult  Pharmacy Consult for Warfarin Dosing and Monitoring Indication: VTE Treatment   Patient Measurements: Height: 5\' 3"  (160 cm) Weight: 128 lb (58.1 kg) IBW/kg (Calculated) : 56.9  Vital Signs: Temp: 98.8 F (37.1 C) (09/14 2314) Temp Source: Oral (09/14 2314) BP: 142/74 (09/14 2314) Pulse Rate: 76 (09/14 2314)  Labs: Recent Labs    10/21/18 0038 10/22/18 0329 10/23/18 0417  HGB 9.7* 10.3* 10.0*  HCT 30.9* 32.8* 31.7*  PLT 126* 146* 177  LABPROT 35.3* 44.2* 38.2*  INR 3.6* 4.8* 4.0*  CREATININE 3.72* 3.88* 3.69*    Estimated Creatinine Clearance: 15 mL/min (A) (by C-G formula based on SCr of 3.69 mg/dL (H)).   Medical History: Past Medical History:  Diagnosis Date  . Anemia   . Antiphospholipid antibody syndrome ([place])   . Arthritis   . DVT (deep venous thrombosis) ([place])   . Essential hypertension   . Gastric ulcer   . GERD (gastroesophageal reflux disease)   . History of CMV   . Hyperlipidemia   . Hyperparathyroidism ([place])   . Pedal edema   . Peripheral vascular disease ([place])   . Rosacea   . Status post kidney transplant 2001  . Stroke ([place])   . TIA (transient ischemic attack)   . Vascular dementia without behavioral disturbance [facility])    Assessment: Pharmacy consulted for warfarin dosing and monitoring in 68  yo male with PMH of DVT. Patient has been admitted with UTI and Acute Renal Failure on CKD w/ PMH of kidney transplant. INR therapeutic on admission. Per wife, prefer to keep pt between INR 2.5 and 3.0 d/t APS and PMH of strokes. H&H stable, PLT trending up  Home Regimen: Warfarin 3mg : Mon, Wed, Fri                              Warfarin 2mg : Tues, Thurs, Sat, Sun  Goal of Therapy:  INR 2-3 Monitor platelets by anticoagulation protocol: Yes   Plan:   INR 4.0. INR elevated but trending down (last dose 9/12)  Hold today's dose and obtain INR with am labs  INRs daily per protocol while receiving  antibiotic  CBC at least every 3 days per protocol.  Pharmacy will continue to follow and order dose based on levels.   Gerald Dexter, PharmD 10/23/2018 7:36 AM

## 2018-10-23 NOTE — Consult Note (Addendum)
PHARMACY CONSULT NOTE - FOLLOW UP  Pharmacy Consult for Electrolyte Monitoring and Replacement   Recent Labs: Potassium (mmol/L)  Date Value  10/23/2018 3.4 (L)   Magnesium (mg/dL)  Date Value  10/23/2018 2.0   Calcium (mg/dL)  Date Value  10/23/2018 9.1   Albumin (g/dL)  Date Value  10/22/2018 3.4 (L)   Phosphorus (mg/dL)  Date Value  10/23/2018 3.0   Sodium (mmol/L)  Date Value  10/23/2018 143    Assessment -K 3.4.  Goal WNL. Currently subtherapeutic -Mg 2.0.  Goal WNL.  Currently therapeutic -Phos 3.0.  Goal WNL.  Currently therapeutic. -[name] receiving furosemide 60mg  IV BID (additional 40 mg dose 9/13)   Plan Replenish K with 20 mEq PO x 2 doses.  Will obtain electrolytes with am labs.   Will replace to maintain electrolytes within normal limits.   Pharmacy will continue to monitor and adjust per consult.   Gerald Dexter, PharmD 10/23/2018 7:49 AM

## 2018-10-23 NOTE — Plan of Care (Signed)

## 2018-10-23 NOTE — Progress Notes (Signed)
Physical Therapy Treatment Patient Details Name: Eric Gill MRN: [id number] DOB: 22-Sep-1950 Today's Date: 10/23/2018    History of Present Illness Pt admitted for renal failure with complaints of urinary frequency and weakness. History includes GERD, anemia, DVT, CVA, and vascular dementia.    PT Comments    Patient alert, in bed, agreeable to PT, did not demonstrate any pain signs/symptoms. Session focused on functional mobility this session. Pt performed supine to sit with supervision, extended time and use of bed rails. Patient was able to demonstrate fair sitting balance. Sit <> stand several times this session, minA for initial standing balance but improvement noted with repetition, cued for hand placement to increase safety with fair carry over. The patient ambulated ~175ft with RW and CGA and close chair follow. Pt exhibited narrow base of support and intermittent instability with ambulation this session. Cued for PLB, standing rest breaks to assess spO2, RW management and activity pacing to increase safety. 1-2 instances of LOB, minA from PT to correct. SpO2>90% throughout ambulation on 2L. Overall the patient is progressing slowly towards goals and would benefit from further skilled PT intervention. Recommendation updated to HHPT with supervision/assistance -24hr pending progress to maintain safety/decrease risk of falls at home.     Follow Up Recommendations  Home health PT;Supervision/Assistance - 24 hour     Equipment Recommendations  Rolling walker with 5" wheels    Recommendations for Other Services       Precautions / Restrictions Precautions Precautions: Fall Restrictions Weight Bearing Restrictions: No    Mobility  Bed Mobility Overal bed mobility: Needs Assistance Bed Mobility: Supine to Sit     Supine to sit: Supervision;HOB elevated     General bed mobility comments: use of bed rails but able to complete without physical assist  Transfers Overall  transfer level: Needs assistance Equipment used: Rolling walker (2 wheeled) Transfers: Sit to/from Stand Sit to Stand: Min guard         General transfer comment: Attempted several times this session, one instance of minA from PT to correct initial standing balance  Ambulation/Gait Ambulation/Gait assistance: Min guard Gait Distance (Feet): 100 Feet Assistive device: Rolling walker (2 wheeled)   Gait velocity: decreased   General Gait Details: Pt exhibited narrow base of support and intermittent instability with ambulation this session. Cued for PLB, standing rest breaks to assess spO2, RW management and activity pacing to increase safety. 1-2 instances of LOB, minA from PT to correct. SpO2>90% throughout ambulation on 2L.   Stairs             Wheelchair Mobility    Modified Rankin (Stroke Patients Only)       Balance Overall balance assessment: Needs assistance Sitting-balance support: Feet supported Sitting balance-Leahy Scale: Fair Sitting balance - Comments: prefers UE support   Standing balance support: Bilateral upper extremity supported Standing balance-Leahy Scale: Poor Standing balance comment: Pt reliant on UE support at least unilaterally during session to maintain safety                            Cognition Arousal/Alertness: Awake/alert Behavior During Therapy: WFL for tasks assessed/performed Overall Cognitive Status: Within Functional Limits for tasks assessed                                        Exercises Other Exercises Other Exercises: Educated on  PLB and activity pacing to increase safety with mobility    General Comments        Pertinent Vitals/Pain Pain Assessment: Faces Faces Pain Scale: No hurt    Home Living                      Prior Function            PT Goals (current goals can now be found in the care plan section) Progress towards PT goals: Progressing toward goals     Frequency    Min 2X/week      PT Plan Discharge plan needs to be updated    Co-evaluation              AM-PAC PT "6 Clicks" Mobility   Outcome Measure  Help needed turning from your back to your side while in a flat bed without using bedrails?: A Little Help needed moving from lying on your back to sitting on the side of a flat bed without using bedrails?: A Little Help needed moving to and from a bed to a chair (including a wheelchair)?: A Little Help needed standing up from a chair using your arms (e.g., wheelchair or bedside chair)?: A Little Help needed to walk in hospital room?: A Little Help needed climbing 3-5 steps with a railing? : A Lot 6 Click Score: 17    End of Session Equipment Utilized During Treatment: Gait belt;Oxygen(2L) Activity Tolerance: Patient tolerated treatment well Patient left: with chair alarm set;in chair;with call bell/phone within reach Nurse Communication: Mobility status PT Visit Diagnosis: Muscle weakness (generalized) (M62.81);History of falling (Z91.81);Difficulty in walking, not elsewhere classified (R26.2);Pain;Unsteadiness on feet (R26.81) Pain - Right/Left: Right Pain - part of body: Ankle and joints of foot     Time: [id number] PT Time Calculation (min) (ACUTE ONLY): 25 min  Charges:  $Gait Training: 8-22 mins $Therapeutic Activity: 8-22 mins                     Lieutenant Diego PT, DPT 4:33 PM,10/23/18 [phone]

## 2018-10-23 NOTE — Progress Notes (Signed)
[facility]  ROUNDING NOTE   Subjective:   Lab Results  Component Value Date   CREATININE 3.69 (H) 10/23/2018   CREATININE 3.88 (H) 10/22/2018   CREATININE 3.72 (H) 10/21/2018   [name] doing fair Oral intake is also fair.  Trying to drink more water Serum creatinine today slightly improved to 3.69  Objective:  Vital signs in last 24 hours:  Temp:  [98 F (36.7 C)-98.8 F (37.1 C)] 98 F (36.7 C) (09/15 0747) Pulse Rate:  [76-90] 84 (09/15 0747) Resp:  [17-18] 18 (09/14 2314) BP: (142-160)/(69-80) 156/75 (09/15 0747) SpO2:  [89 %-97 %] 94 % (09/15 0747)  Weight change:  Filed Weights   10/15/18 2309  Weight: 58.1 kg    Intake/Output: I/O last 3 completed shifts: In: 240 [P.O.:240] Out: 3075 [Urine:3075]   Intake/Output this shift:  Total I/O In: 542.4 [P.O.:240; IV Piggyback:302.4] Out: 490 [Urine:490]  Physical Exam: General: NAD, laying in bed  Head: Normocephalic, atraumatic. Moist oral mucosal membranes  Eyes: Anicteric,    Neck: Supple,   Lungs:   Coarse breath sounds at bases  Heart: Regular rate and rhythm  Abdomen:  Soft, nontender,   Extremities: no peripheral edema.  Neurologic: Nonfocal, moving all four extremities  Skin:  Scattered ecchymosis    Basic Metabolic Panel: Recent Labs  Lab 10/19/18 0619 10/19/18 1432 10/20/18 0352 10/21/18 0038 10/22/18 0329 10/23/18 0417  NA 142  --  140 139 140 143  K 3.3*  --  3.5 4.1 4.0 3.4*  CL 112*  --  108 105 103 103  CO2 23  --  24 23 25 28   GLUCOSE 97  --  91 104* 134* 159*  BUN 31*  --  33* 39* 47* 56*  CREATININE 3.45*  --  3.54* 3.72* 3.88* 3.69*  CALCIUM 8.5*  --  8.5* 8.9 9.2 9.1  MG  --  1.9 1.9 1.9  --  2.0  PHOS 1.6*  --  3.4 3.4  3.4 3.7 3.0    Liver Function Tests: Recent Labs  Lab 10/19/18 0619 10/21/18 0038 10/22/18 0329  ALBUMIN 3.1* 3.3* 3.4*   No results for input(s): LIPASE, AMYLASE in the last 168 hours. No results for input(s): AMMONIA in the  last 168 hours.  CBC: Recent Labs  Lab 10/17/18 0328 10/19/18 0619 10/21/18 0038 10/22/18 0329 10/23/18 0417  WBC 11.2* 8.0 9.7 5.0 7.9  HGB 9.6* 9.9* 9.7* 10.3* 10.0*  HCT 32.1* 32.0* 30.9* 32.8* 31.7*  MCV 94.7 91.4 91.7 90.4 90.1  PLT 108* 109* 126* 146* 177    Cardiac Enzymes: No results for input(s): CKTOTAL, CKMB, CKMBINDEX, TROPONINI in the last 168 hours.  BNP: Invalid input(s): POCBNP  CBG: No results for input(s): GLUCAP in the last 168 hours.  Microbiology: Results for orders placed or performed during the hospital encounter of 10/16/18  Urine culture     Status: None   Collection Time: 10/16/18  2:26 AM   Specimen: Urine, Random  Result Value Ref Range Status   Specimen Description   Final    URINE, RANDOM Performed at [facility], [address], [place], [place] [address]    Special Requests   Final    NONE Performed at [facility], [address], [place], [place] [address]    Culture   Final    NO GROWTH Performed at [facility], [place] [address], [place], [place] [address]    Report Status 10/17/2018 FINAL  Final  Blood culture (routine x 2)     Status: None   Collection Time: 10/16/18  2:26 AM   Specimen: BLOOD  Result Value Ref Range Status   Specimen Description BLOOD LEFT FA  Final   Special Requests   Final    BOTTLES DRAWN AEROBIC AND ANAEROBIC Blood Culture adequate volume   Culture   Final    NO GROWTH 5 DAYS Performed at [facility], [address] [place], [place] [address]    Report Status 10/21/2018 FINAL  Final  Blood culture (routine x 2)     Status: Abnormal   Collection Time: 10/16/18  2:26 AM   Specimen: BLOOD  Result Value Ref Range Status   Specimen Description   Final    BLOOD LEFT FA Performed at [facility], [address] [place], [place] [address]    Special Requests   Final    BOTTLES DRAWN AEROBIC AND ANAEROBIC Blood Culture adequate volume Performed at  [facility], [address] [place], [place] [address]    Culture  Setup Time   Final    ANAEROBIC BOTTLE ONLY GRAM NEGATIVE RODS CRITICAL RESULT CALLED TO, READ BACK BY AND VERIFIED WITH: Dustin Folks AT [id number] ON 10/16/2018 BY JPM Performed at [facility], [place] [address], [place], [place] [address]    Culture KLEBSIELLA OXYTOCA (A)  Final   Report Status 10/18/2018 FINAL  Final   Organism ID, Bacteria KLEBSIELLA OXYTOCA  Final      Susceptibility   Klebsiella oxytoca - MIC*    AMPICILLIN >=32 RESISTANT Resistant     CEFAZOLIN <=4 SENSITIVE Sensitive     CEFEPIME <=1 SENSITIVE Sensitive     CEFTAZIDIME <=1 SENSITIVE Sensitive     CEFTRIAXONE <=1 SENSITIVE Sensitive     CIPROFLOXACIN <=0.25 SENSITIVE Sensitive     GENTAMICIN <=1 SENSITIVE Sensitive     IMIPENEM <=0.25 SENSITIVE Sensitive     TRIMETH/SULFA <=20 SENSITIVE Sensitive     AMPICILLIN/SULBACTAM 8 SENSITIVE Sensitive     PIP/TAZO <=4 SENSITIVE Sensitive     * KLEBSIELLA OXYTOCA  SARS Coronavirus 2 [facility] order, Performed in [facility] hospital lab) Nasopharyngeal Nasopharyngeal Swab     Status: None   Collection Time: 10/16/18  2:26 AM   Specimen: Nasopharyngeal Swab  Result Value Ref Range Status   SARS Coronavirus 2 NEGATIVE NEGATIVE Final    Comment: (NOTE) If result is NEGATIVE SARS-CoV-2 target nucleic acids are NOT DETECTED. The SARS-CoV-2 RNA is generally detectable in upper and lower  respiratory specimens during the acute phase of infection. The lowest  concentration of SARS-CoV-2 viral copies this assay can detect is 250  copies / mL. A negative result does not preclude SARS-CoV-2 infection  and should not be used as the sole basis for treatment or other  patient management decisions.  A negative result may occur with  improper specimen collection / handling, submission of specimen other  than nasopharyngeal swab, presence of viral mutation(s) within the  areas targeted by this assay, and  inadequate number of viral copies  (<250 copies / mL). A negative result must be combined with clinical  observations, patient history, and epidemiological information. If result is POSITIVE SARS-CoV-2 target nucleic acids are DETECTED. The SARS-CoV-2 RNA is generally detectable in upper and lower  respiratory specimens dur ing the acute phase of infection.  Positive  results are indicative of active infection with SARS-CoV-2.  Clinical  correlation with patient history and other diagnostic information is  necessary to determine patient infection status.  Positive results do  not rule out bacterial infection or co-infection with other viruses. If result is PRESUMPTIVE POSTIVE SARS-CoV-2 nucleic acids MAY BE PRESENT.   A presumptive positive result was obtained on the submitted specimen  and confirmed on repeat testing.  While 2019 novel coronavirus  (SARS-CoV-2) nucleic acids may be present in the submitted sample  additional confirmatory testing may be necessary for epidemiological  and / or clinical management purposes  to differentiate between  SARS-CoV-2 and other Sarbecovirus currently known to infect humans.  If clinically indicated additional testing with an alternate test  methodology [phone]) is advised. The SARS-CoV-2 RNA is generally  detectable in upper and lower respiratory sp ecimens during the acute  phase of infection. The expected result is Negative. Fact Sheet for Patients:  StrictlyIdeas.no Fact Sheet for Healthcare Providers: BankingDealers.co.za This test is not yet approved or cleared by the [place] FDA and has been authorized for detection and/or diagnosis of SARS-CoV-2 by FDA under an Emergency Use Authorization (EUA).  This EUA will remain in effect (meaning this test can be used) for the duration of the COVID-19 declaration under Section 564(b)(1) of the Act, 21 U.S.C. section 360bbb-3(b)(1), unless the  authorization is terminated or revoked sooner. Performed at [facility], [address] [place], [place] [address]   Blood Culture ID Panel (Reflexed)     Status: Abnormal   Collection Time: 10/16/18  2:26 AM  Result Value Ref Range Status   Enterococcus species NOT DETECTED NOT DETECTED Final   Listeria monocytogenes NOT DETECTED NOT DETECTED Final   Staphylococcus species NOT DETECTED NOT DETECTED Final   Staphylococcus aureus (BCID) NOT DETECTED NOT DETECTED Final   Streptococcus species NOT DETECTED NOT DETECTED Final   Streptococcus agalactiae NOT DETECTED NOT DETECTED Final   Streptococcus pneumoniae NOT DETECTED NOT DETECTED Final   Streptococcus pyogenes NOT DETECTED NOT DETECTED Final   Acinetobacter baumannii NOT DETECTED NOT DETECTED Final   Enterobacteriaceae species DETECTED (A) NOT DETECTED Final    Comment: Enterobacteriaceae represent a large family of gram-negative bacteria, not a single organism. CRITICAL RESULT CALLED TO, READ BACK BY AND VERIFIED WITH: ELLINGTON,A AT [id number] ON 10/16/2018 BY JPM    Enterobacter cloacae complex NOT DETECTED NOT DETECTED Final   Escherichia coli NOT DETECTED NOT DETECTED Final   Klebsiella oxytoca DETECTED (A) NOT DETECTED Final    Comment: CRITICAL RESULT CALLED TO, READ BACK BY AND VERIFIED WITH: ELLINGTON,A AT 1747 ON 10/16/2018 BY JPM    Klebsiella pneumoniae NOT DETECTED NOT DETECTED Final   Proteus species NOT DETECTED NOT DETECTED Final   Serratia marcescens NOT DETECTED NOT DETECTED Final   Carbapenem resistance NOT DETECTED NOT DETECTED Final   Haemophilus influenzae NOT DETECTED NOT DETECTED Final   Neisseria meningitidis NOT DETECTED NOT DETECTED Final   Pseudomonas aeruginosa NOT DETECTED NOT DETECTED Final   Candida albicans NOT DETECTED NOT DETECTED Final   Candida glabrata NOT DETECTED NOT DETECTED Final   Candida krusei NOT DETECTED NOT DETECTED Final   Candida parapsilosis NOT DETECTED NOT DETECTED Final    Candida tropicalis NOT DETECTED NOT DETECTED Final    Comment: Performed at [facility], [place],  [address]  CULTURE, BLOOD (ROUTINE X 2) w Reflex to ID Panel     Status: None (Preliminary result)   Collection Time: 10/21/18 12:38 AM   Specimen: BLOOD  Result Value Ref Range Status   Specimen Description BLOOD RIGHT  ANTECUBITAL  Final   Special Requests   Final    BOTTLES DRAWN AEROBIC AND ANAEROBIC Blood Culture adequate volume   Culture   Final    NO GROWTH 2 DAYS Performed at [facility], [address] [place], [place] [address]    Report Status PENDING  Incomplete  CULTURE, BLOOD (ROUTINE X 2) w Reflex to ID Panel     Status: None (Preliminary result)   Collection Time: 10/21/18 12:38 AM   Specimen: BLOOD  Result Value Ref Range Status   Specimen Description BLOOD LEFT ANTECUBITAL  Final   Special Requests   Final    BOTTLES DRAWN AEROBIC AND ANAEROBIC Blood Culture adequate volume   Culture   Final    NO GROWTH 2 DAYS Performed at [facility], [address] [place],  [address]    Report Status PENDING  Incomplete    Coagulation Studies: Recent Labs    10/21/18 0038 10/22/18 0329 10/23/18 0417  LABPROT 35.3* 44.2* 38.2*  INR 3.6* 4.8* 4.0*    Urinalysis: No results for input(s): COLORURINE, LABSPEC, PHURINE, GLUCOSEU, HGBUR, BILIRUBINUR, KETONESUR, PROTEINUR, UROBILINOGEN, NITRITE, LEUKOCYTESUR in the last 72 hours.  Invalid input(s): APPERANCEUR    Imaging: No results found.   Medications:   . cefTRIAXone (ROCEPHIN)  IV 2 g (10/23/18 1100)   . amLODipine  10 mg Oral Daily  . buPROPion  300 mg Oral Daily  . calcitRIOL  0.25 mcg Oral QHS  . citalopram  20 mg Oral Daily  . dicyclomine  10 mg Oral TID AC  . donepezil  10 mg Oral QHS  . furosemide  60 mg Intravenous BID  . labetalol  200 mg Oral BID  . magnesium oxide  400 mg Oral BID  . methylPREDNISolone (SOLU-MEDROL) injection  20 mg  Intravenous Q12H  . mometasone-formoterol  2 puff Inhalation BID  . mycophenolate  250 mg Oral q morning - 10a   And  . mycophenolate  500 mg Oral QHS  . pantoprazole  40 mg Oral Daily  . potassium chloride  20 mEq Oral Once  . tacrolimus  0.5 mg Oral BID  . Warfarin - Pharmacist Dosing Inpatient   Does not apply q1800   acetaminophen **OR** acetaminophen, albuterol, bisacodyl, diphenoxylate-atropine, hydrALAZINE, HYDROcodone-acetaminophen, ondansetron **OR** ondansetron (ZOFRAN) IV  Assessment/ Plan:  [name] is a 68 y.o. 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 Acute renal failure with metabolic acidosis on Chronic kidney disease stage IVT Status post renal transplantation in 2001.  His baseline creatinine is 2.8, GFR of 23 on 03/27/18. Acute renal failure seems secondary to prerenal azotemia and current infection.  Followed by Dr. Alger Simons, [facility]. - Continue mycophenolate, tacrolimus and prednisone. - Currently getting high-dose IV furosemide 60 mg twice a day  2. Urinary tract infection: Klebsiella oxytoca.   -Currently being treated with IV ceftriaxone.   3. Anemia of chronic kidney disease: Lab Results  Component Value Date   HGB 10.0 (L) 10/23/2018       LOS: 7 Cordai Rodrigue 9/15/20202:07 PM

## 2018-10-24 LAB — BASIC METABOLIC PANEL
Anion gap: 12 (ref 5–15)
BUN: 60 mg/dL — ABNORMAL HIGH (ref 8–23)
CO2: 29 mmol/L (ref 22–32)
Calcium: 9.1 mg/dL (ref 8.9–10.3)
Chloride: 102 mmol/L (ref 98–111)
Creatinine, Ser: 3.85 mg/dL — ABNORMAL HIGH (ref 0.61–1.24)
GFR calc Af Amer: 17 mL/min — ABNORMAL LOW (ref >60)
GFR calc non Af Amer: 15 mL/min — ABNORMAL LOW (ref >60)
Glucose, Bld: 121 mg/dL — ABNORMAL HIGH (ref 70–99)
Potassium: 3.2 mmol/L — ABNORMAL LOW (ref 3.5–5.1)
Sodium: 143 mmol/L (ref 135–145)

## 2018-10-24 LAB — PROTIME-INR
INR: 2.8 — ABNORMAL HIGH (ref 0.8–1.2)
Prothrombin Time: 28.8 seconds — ABNORMAL HIGH (ref 11.4–15.2)

## 2018-10-24 LAB — MAGNESIUM: Magnesium: 1.8 mg/dL (ref 1.7–2.4)

## 2018-10-24 MED ORDER — POTASSIUM CHLORIDE CRYS ER 20 MEQ PO TBCR
40.0000 meq | EXTENDED_RELEASE_TABLET | Freq: Two times a day (BID) | ORAL | Status: AC
  Administered 2018-10-24 (×2): 40 meq via ORAL
  Filled 2018-10-24 (×2): qty 2

## 2018-10-24 MED ORDER — WARFARIN SODIUM 2 MG PO TABS: 2.0000 mg | ORAL_TABLET | Freq: Once | ORAL | Status: DC

## 2018-10-24 MED ORDER — WARFARIN SODIUM 1 MG PO TABS
1.0000 mg | ORAL_TABLET | Freq: Once | ORAL | Status: AC
  Administered 2018-10-24: 1 mg via ORAL
  Filled 2018-10-24: qty 1

## 2018-10-24 NOTE — Care Management Important Message (Signed)
Important Message  Patient Details  Name: Eric Gill MRN: [id number] Date of Birth: 01-Feb-1951   Medicare Important Message Given:  Yes     [name] 10/24/2018, 11:03 AM

## 2018-10-24 NOTE — Consult Note (Signed)
PHARMACY CONSULT NOTE - FOLLOW UP  Pharmacy Consult for Electrolyte Monitoring and Replacement   Recent Labs: Potassium (mmol/L)  Date Value  10/24/2018 3.2 (L)   Magnesium (mg/dL)  Date Value  10/23/2018 2.0   Calcium (mg/dL)  Date Value  10/24/2018 9.1   Albumin (g/dL)  Date Value  10/22/2018 3.4 (L)   Phosphorus (mg/dL)  Date Value  10/23/2018 3.0   Sodium (mmol/L)  Date Value  10/24/2018 143    Assessment -K 3.2  Goal WNL. Currently subtherapeutic -Mg 2.0.  Goal WNL.  Currently therapeutic -Phos 3.0.  Goal WNL.  Currently therapeutic.  -[name] receiving furosemide 60mg  IV BID  Patient home regimen is Furosemide 40mg  qd with a total of 68meq KCl daily replenishment  Plan Replenish K with 40 mEq PO x 2 doses (pt received 73meq total 9/15 and K continued to drop 3.4>3.2).  Will obtain electrolytes with am labs, rechecking magnesium.   Will replace to maintain electrolytes within normal limits.   Pharmacy will continue to monitor and adjust per consult.   Lu Duffel, PharmD, BCPS Clinical Pharmacist 10/24/2018 7:16 AM

## 2018-10-24 NOTE — TOC Progression Note (Addendum)
Transition of Care [facility]) - Progression Note    Patient Details  Name: Eric Gill MRN: [id number] Date of Birth: 1950-10-30  [facility] CM/SW Contact  Anabelen Kaminsky, Lenice Llamas Phone [phone]  10/24/2018, 9:22 AM  Clinical Narrative: Clinical Social Worker (CSW) contacted patient's wife Rodena Piety to discuss D/C plan. Per wife patient is on room air at baseline and only uses inhalers at home. CSW made wife aware that RN will try to wean patient off oxygen prior to D/C. Wife requested a rolling walker and bedside commode for patient. [employer] agency representative is aware of above. RN aware of above. CSW will continue to follow and assist as needed.     Expected Discharge Plan: [place] Barriers to Discharge: Continued Medical Work up  Expected Discharge Plan and Services Expected Discharge Plan: [place] In-house Referral: Clinical Social Work     Living arrangements for the past 2 months: [place]: PT [place]: [place] ([place]) Date [place]: 10/17/18   Representative spoke with at [place]: [place] ([place]) Interventions    Readmission Risk Interventions No flowsheet data found.

## 2018-10-24 NOTE — Plan of Care (Signed)

## 2018-10-24 NOTE — Consult Note (Addendum)
  ANTICOAGULATION CONSULT NOTE - Initial Consult  Pharmacy Consult for Warfarin Dosing and Monitoring Indication: VTE Treatment   Patient Measurements: Height: 5\' 3"  (160 cm) Weight: 128 lb (58.1 kg) IBW/kg (Calculated) : 56.9  Vital Signs: Temp: 97.9 F (36.6 C) (09/15 2348) Temp Source: Oral (09/15 2348) BP: 136/64 (09/15 2348) Pulse Rate: 72 (09/15 2348)  Labs: Recent Labs    10/22/18 0329 10/23/18 0417 10/24/18 0427  HGB 10.3* 10.0*  --   HCT 32.8* 31.7*  --   PLT 146* 177  --   LABPROT 44.2* 38.2* 28.8*  INR 4.8* 4.0* 2.8*  CREATININE 3.88* 3.69* 3.85*    Estimated Creatinine Clearance: 14.4 mL/min (A) (by C-G formula based on SCr of 3.85 mg/dL (H)).   Medical History: Past Medical History:  Diagnosis Date  . Anemia   . Antiphospholipid antibody syndrome ([place])   . Arthritis   . DVT (deep venous thrombosis) ([place])   . Essential hypertension   . Gastric ulcer   . GERD (gastroesophageal reflux disease)   . History of CMV   . Hyperlipidemia   . Hyperparathyroidism ([place])   . Pedal edema   . Peripheral vascular disease ([place])   . Rosacea   . Status post kidney transplant 2001  . Stroke ([place])   . TIA (transient ischemic attack)   . Vascular dementia without behavioral disturbance [facility])    Assessment: Pharmacy consulted for warfarin dosing and monitoring in 68  yo male with PMH of DVT. Patient has been admitted with UTI and Acute Renal Failure on CKD w/ PMH of kidney transplant. INR therapeutic on admission. Per wife, prefer to keep pt between INR 2.5 and 3.0 d/t APS and PMH of strokes. H&H stable, PLT trending up.  Home Regimen: Warfarin 3mg : Mon, Wed, Fri                              Warfarin 2mg : Cain Saupe, Sat, Sun   INR Warfarin dose given 9/8 2.4 2mg  9/9 2.5 3mg  9/10 2.5 2mg  9/11 2.6 3mg   9/12 3.1 2mg  9/13 3.6 HELD 9/14 4.8 HELD 9/15 4.0 HELD 9/16 2.8       1 mg  Vitamin K Antagonists (eg, warfarin): Cephalosporins may enhance the  anticoagulant effect of Vitamin K Antagonists. Risk C: Monitor therapy - likely unexpected bump in INR due to this interaction  Goal of Therapy:  INR 2.5-3.0 Monitor platelets by anticoagulation protocol: Yes   Plan:   INR 2.8 and trending down (last dose 9/12)  Will give Warfarin 1 mg and obtain INR with am labs  INRs daily per protocol while receiving antibiotic  CBC at least every 3 days per protocol.  Pharmacy will continue to follow and order dose based on levels.   Gerald Dexter, PharmD Pharmacy Resident  10/24/2018 3:06 PM

## 2018-10-24 NOTE — Progress Notes (Signed)
[facility] at [place] NAME: Eric Gill    MR#:  [id number]  DATE OF BIRTH:  10-20-1950  SUBJECTIVE:  CHIEF COMPLAINT:   Chief Complaint  Patient presents with  . Urinary Frequency  . Weakness  [name] feeling better sitting in chair still on 2 L of oxygen   REVIEW OF SYSTEMS:  Review of Systems  Constitutional: Negative for chills, fever and malaise/fatigue.  HENT: Negative for sore throat.   Eyes: Negative for blurred vision and double vision.  Respiratory: Negative for cough, hemoptysis, shortness of breath, wheezing and stridor.   Cardiovascular: Negative for chest pain, palpitations, orthopnea and leg swelling.  Gastrointestinal: Negative for abdominal pain, blood in stool, diarrhea, melena, nausea and vomiting.  Genitourinary: Negative for dysuria, flank pain and hematuria.  Musculoskeletal: Negative for back pain and joint pain.  Skin: Negative for rash.  Neurological: Negative for dizziness, sensory change, focal weakness, seizures, loss of consciousness, weakness and headaches.  Endo/Heme/Allergies: Negative for polydipsia.  Psychiatric/Behavioral: Negative for depression. The patient is not nervous/anxious.     DRUG ALLERGIES:   Allergies  Allergen Reactions  . Losartan Cough  . Statins Nausea And Vomiting and Other (See Comments)    Reaction:  Muscle pain    VITALS:  Blood pressure 138/77, pulse 88, temperature 98.7 F (37.1 C), temperature source Oral, resp. rate 17, height 5\' 3"  (1.6 m), weight 58.1 kg, SpO2 94 %. PHYSICAL EXAMINATION:  Physical Exam Constitutional:      General: He is not in acute distress. HENT:     Head: Normocephalic.     Mouth/Throat:     Mouth: Mucous membranes are moist.  Eyes:     General: No scleral icterus.    Conjunctiva/sclera: Conjunctivae normal.     Pupils: Pupils are equal, round, and reactive to light.  Neck:     Musculoskeletal: Normal range of motion and neck  supple.     Vascular: No JVD.     Trachea: No tracheal deviation.  Cardiovascular:     Rate and Rhythm: Normal rate and regular rhythm.     Heart sounds: Normal heart sounds. No murmur. No gallop.   Pulmonary:     Effort: Pulmonary effort is normal. No respiratory distress.     Breath sounds: No rales.  Abdominal:     General: Bowel sounds are normal. There is no distension.     Palpations: Abdomen is soft.     Tenderness: There is no abdominal tenderness. There is no rebound.  Musculoskeletal: Normal range of motion.        General: No tenderness.     Right lower leg: No edema.     Left lower leg: No edema.  Skin:    Findings: No erythema or rash.  Neurological:     General: No focal deficit present.     Mental Status: He is alert.     Cranial Nerves: No cranial nerve deficit.  Psychiatric:        Mood and Affect: Mood normal.    LABORATORY PANEL:  Male CBC Recent Labs  Lab 10/23/18 0417  WBC 7.9  HGB 10.0*  HCT 31.7*  PLT 177   ------------------------------------------------------------------------------------------------------------------ Chemistries  Recent Labs  Lab 10/24/18 0427  NA 143  K 3.2*  CL 102  CO2 29  GLUCOSE 121*  BUN 60*  CREATININE 3.85*  CALCIUM 9.1  MG 1.8   RADIOLOGY:  No results found. ASSESSMENT AND PLAN:   ARF  on CKD stage IV, h/o kidney transplant. Nephrologist following.  Given Lasix yesterday but now creatinine is increased Lasix discontinued Continue mycophenolate, tacrolimus and prednisone per Dr. Candiss Norse  This may be a new baseline for this patient  HTN emergency continue current regimen blood pressures under control   UTI and bacteremia.  U/C: No growth.  Blood culture: Klebsiella oxytoca. Cefepime is discontinued, changed to Rocephin and IV antibiotics for total 14 days per Dr. Elisabeth Cara.  Acute metabolic encephalopathy Multifactorial due to ongoing infectious process and hypoxia Improved Seen by neurology    H/O DVT, PVD, CVA, coumadin PTD.  INR 4.8. Pharmacist assisting with dosing Coumadin.  Anemia of chronic disease.  Stable.  Hypokalemia.  Replaced   Hypophosphatemia.  Phosphate supplement.  Acute hypoxic respiratory failure due to acute diastolic CHF Secondary to pulmonary edema.  Patient slowly improving  Possible discharge tomorrow   DVT prophylaxis ; patient on Coumadin   all the records are reviewed and case discussed with Care Management/Social Worker. Management plans discussed with the patient, and he is in in agreement.  CODE STATUS: Full Code  TOTAL TIME TAKING CARE OF THIS PATIENT: 72minutes.   More than 50% of the time was spent in counseling/coordination of care: YES  POSSIBLE D/C IN 3 DAYS, DEPENDING ON CLINICAL CONDITION.   Dustin Flock M.D on 10/24/2018 at 1:45 PM  Between 7am to 6pm - Pager - [phone]  After 6pm go to [email or web address] - [facility]

## 2018-10-25 LAB — PROTIME-INR
INR: 2.1 — ABNORMAL HIGH (ref 0.8–1.2)
Prothrombin Time: 23 seconds — ABNORMAL HIGH (ref 11.4–15.2)

## 2018-10-25 LAB — BASIC METABOLIC PANEL
Anion gap: 12 (ref 5–15)
BUN: 63 mg/dL — ABNORMAL HIGH (ref 8–23)
CO2: 28 mmol/L (ref 22–32)
Calcium: 9.3 mg/dL (ref 8.9–10.3)
Chloride: 104 mmol/L (ref 98–111)
Creatinine, Ser: 3.69 mg/dL — ABNORMAL HIGH (ref 0.61–1.24)
GFR calc Af Amer: 18 mL/min — ABNORMAL LOW (ref >60)
GFR calc non Af Amer: 16 mL/min — ABNORMAL LOW (ref >60)
Glucose, Bld: 102 mg/dL — ABNORMAL HIGH (ref 70–99)
Potassium: 3.9 mmol/L (ref 3.5–5.1)
Sodium: 144 mmol/L (ref 135–145)

## 2018-10-25 LAB — VITAMIN B1: Vitamin B1 (Thiamine): 98.1 nmol/L (ref 66.5–200.0)

## 2018-10-25 LAB — MAGNESIUM: Magnesium: 1.9 mg/dL (ref 1.7–2.4)

## 2018-10-25 MED ORDER — WARFARIN SODIUM 2 MG PO TABS
2.0000 mg | ORAL_TABLET | Freq: Once | ORAL | Status: AC
  Administered 2018-10-25: 2 mg via ORAL
  Filled 2018-10-25: qty 1

## 2018-10-25 MED ORDER — WARFARIN SODIUM 1 MG PO TABS
1.0000 mg | ORAL_TABLET | Freq: Once | ORAL | Status: DC
  Filled 2018-10-25: qty 1

## 2018-10-25 NOTE — Progress Notes (Signed)
[facility] at [place] NAME: Eric Gill    MR#:  [id number]  DATE OF BIRTH:  1950-05-19  SUBJECTIVE:  CHIEF COMPLAINT:   Chief Complaint  Patient presents with  . Urinary Frequency  . Weakness  [name] feeling better sitting in bed and having breakfast.  Oxygen noted to be 96% on 1 L this morning.  REVIEW OF SYSTEMS:  Review of Systems  Constitutional: Negative for chills, fever and malaise/fatigue.  HENT: Negative for sore throat.   Eyes: Negative for blurred vision and double vision.  Respiratory: Negative for cough, hemoptysis, shortness of breath, wheezing and stridor.   Cardiovascular: Negative for chest pain, palpitations, orthopnea and leg swelling.  Gastrointestinal: Negative for abdominal pain, blood in stool, diarrhea, melena, nausea and vomiting.  Genitourinary: Negative for dysuria, flank pain and hematuria.  Musculoskeletal: Negative for back pain and joint pain.  Skin: Negative for rash.  Neurological: Negative for dizziness, sensory change, focal weakness, seizures, loss of consciousness, weakness and headaches.  Endo/Heme/Allergies: Negative for polydipsia.  Psychiatric/Behavioral: Negative for depression. The patient is not nervous/anxious.     DRUG ALLERGIES:   Allergies  Allergen Reactions  . Losartan Cough  . Statins Nausea And Vomiting and Other (See Comments)    Reaction:  Muscle pain    VITALS:  Blood pressure (!) 157/74, pulse 70, temperature 97.8 F (36.6 C), temperature source Oral, resp. rate 18, height 5\' 3"  (1.6 m), weight 58.1 kg, SpO2 96 %. PHYSICAL EXAMINATION:  Physical Exam Constitutional:      General: He is not in acute distress. HENT:     Head: Normocephalic.     Mouth/Throat:     Mouth: Mucous membranes are moist.  Eyes:     General: No scleral icterus.    Conjunctiva/sclera: Conjunctivae normal.     Pupils: Pupils are equal, round, and reactive to light.  Neck:   Musculoskeletal: Normal range of motion and neck supple.     Vascular: No JVD.     Trachea: No tracheal deviation.  Cardiovascular:     Rate and Rhythm: Normal rate and regular rhythm.     Heart sounds: Normal heart sounds. No murmur. No gallop.   Pulmonary:     Effort: Pulmonary effort is normal. No respiratory distress.     Breath sounds: No rales.  Abdominal:     General: Bowel sounds are normal. There is no distension.     Palpations: Abdomen is soft.     Tenderness: There is no abdominal tenderness. There is no rebound.  Musculoskeletal: Normal range of motion.        General: No tenderness.     Right lower leg: No edema.     Left lower leg: No edema.  Skin:    Findings: No erythema or rash.  Neurological:     General: No focal deficit present.     Mental Status: He is alert.     Cranial Nerves: No cranial nerve deficit.  Psychiatric:        Mood and Affect: Mood normal.    LABORATORY PANEL:  Male CBC Recent Labs  Lab 10/23/18 0417  WBC 7.9  HGB 10.0*  HCT 31.7*  PLT 177   ------------------------------------------------------------------------------------------------------------------ Chemistries  Recent Labs  Lab 10/25/18 0411  NA 144  K 3.9  CL 104  CO2 28  GLUCOSE 102*  BUN 63*  CREATININE 3.69*  CALCIUM 9.3  MG 1.9   RADIOLOGY:  No results found. ASSESSMENT  AND PLAN:   ARF on CKD stage IV, h/o kidney transplant. Nephrologist following.  Recently diuresed with IV Lasix which is previously been discontinued due to increase in creatinine.  Noted slight improvement in renal function this morning. Continue mycophenolate, tacrolimus and prednisone per Dr. Candiss Norse  This may be a new baseline for this patient  HTN emergency continue current regimen blood pressures under control Blood pressure fairly controlled given patient's age.  UTI and bacteremia.  U/C: No growth.  Blood culture: Klebsiella oxytoca. Cefepime is discontinued, changed to Rocephin  .to continue IV antibiotics until 10/30/2018  Dr. Francee Nodal.  Acute metabolic encephalopathy Multifactorial due to ongoing infectious process and hypoxia Improved Seen by neurology  H/O DVT, PVD, CVA, coumadin PTD.  INR down to 2.1. Pharmacist assisting with dosing Coumadin.  Anemia of chronic disease.  Stable.  Hypokalemia.  Replaced   Hypophosphatemia.  Phosphate supplement.  Acute hypoxic respiratory failure due to acute diastolic CHF Secondary to pulmonary edema.  Patient slowly improving  DVT prophylaxis ; patient on Coumadin   all the records are reviewed and case discussed with Care Management/Social Worker. Management plans discussed with the patient, and he is in in agreement.  CODE STATUS: Full Code  TOTAL TIME TAKING CARE OF THIS PATIENT: 20minutes.   More than 50% of the time was spent in counseling/coordination of care: YES  POSSIBLE D/C IN 3 DAYS, DEPENDING ON CLINICAL CONDITION.   Kayleana Waites M.D on 10/25/2018 at 2:00 PM  Between 7am to 6pm - Pager - [phone]  After 6pm go to [email or web address] - [facility]

## 2018-10-25 NOTE — Consult Note (Signed)
PHARMACY CONSULT NOTE - FOLLOW UP  Pharmacy Consult for Electrolyte Monitoring and Replacement   Recent Labs: Potassium (mmol/L)  Date Value  10/25/2018 3.9   Magnesium (mg/dL)  Date Value  10/25/2018 1.9   Calcium (mg/dL)  Date Value  10/25/2018 9.3   Albumin (g/dL)  Date Value  10/22/2018 3.4 (L)   Phosphorus (mg/dL)  Date Value  10/23/2018 3.0   Sodium (mmol/L)  Date Value  10/25/2018 144    Assessment -K 3.9  Goal WNL. Currently therapeutic -Mg 1.9.  Goal WNL.  Currently therapeutic -Phos 3.0.  Goal WNL.  Currently therapeutic.  -Furosemide 60mg  IV BID discontinued 9/16.   Patient home regimen is Furosemide 40mg  qd with a total of 18meq KCl daily replenishment  Plan No replenishment needed at this time.  Will obtain electrolytes with AM labs.  Will replace to maintain electrolytes within normal limits.   Pharmacy will continue to monitor and adjust per consult.   Gerald Dexter, PharmD Pharmacy Resident  10/25/2018 8:40 AM

## 2018-10-25 NOTE — Progress Notes (Signed)
Physical Therapy Treatment Patient Details Name: Eric Gill MRN: [id number] DOB: [birth year] Today's Date: 10/25/2018    History of Present Illness Pt admitted for renal failure with complaints of urinary frequency and weakness. History includes GERD, anemia, DVT, CVA, and vascular dementia.    PT Comments    To edge of bed with supervision.  Stood with min guard x 1 from bed and chair.  He is able to ambulate 160' x 2 with seated rest in room.  Gait remains generally unsteady and requires +1 assist at all times for safety.  Pt agrees stating he would feel uncomfortable by himself in the hallway.  Standing marches, slr, and heel raises x 10 with B UE support.  Wife called during session and discussed need for +1 assist for mobility.  She stated she is comfortable with assist level and will be able to help him. He remains at increased risk for falls.  Pt was on O2 previous session.  O2 off in room stating nursing had removed it.  94% on room air.  While pt does decrease to 89% with longer distances of gait, he quickly returns to baseline.   Follow Up Recommendations  Home health PT;Supervision/Assistance - 24 hour;Supervision for mobility/OOB     Equipment Recommendations  Rolling walker with 5" wheels    Recommendations for Other Services       Precautions / Restrictions Precautions Precautions: Fall    Mobility  Bed Mobility Overal bed mobility: Modified Independent Bed Mobility: Sit to Supine     Supine to sit: Supervision;HOB elevated        Transfers Overall transfer level: Needs assistance Equipment used: Rolling walker (2 wheeled) Transfers: Sit to/from Stand Sit to Stand: Min assist            Ambulation/Gait Ambulation/Gait assistance: Herbalist (Feet): 160 Feet Assistive device: Rolling walker (2 wheeled) Gait Pattern/deviations: Step-through pattern;Narrow base of support Gait velocity: decreased   General Gait Details: 160' x  2   Stairs             Wheelchair Mobility    Modified Rankin (Stroke Patients Only)       Balance Overall balance assessment: Needs assistance Sitting-balance support: Feet supported Sitting balance-Leahy Scale: Good     Standing balance support: Bilateral upper extremity supported Standing balance-Leahy Scale: Poor Standing balance comment: Pt reliant on UE support at least unilaterally during session to maintain safety                            Cognition Arousal/Alertness: Awake/alert Behavior During Therapy: WFL for tasks assessed/performed Overall Cognitive Status: Within Functional Limits for tasks assessed                                        Exercises Other Exercises Other Exercises: discussed safety and need for +1 assist with pt and wife    General Comments        Pertinent Vitals/Pain Pain Assessment: No/denies pain    Home Living                      Prior Function            PT Goals (current goals can now be found in the care plan section) Progress towards PT goals: Progressing toward goals  Frequency    Min 2X/week      PT Plan Current plan remains appropriate    Co-evaluation              AM-PAC PT "6 Clicks" Mobility   Outcome Measure  Help needed turning from your back to your side while in a flat bed without using bedrails?: A Little Help needed moving from lying on your back to sitting on the side of a flat bed without using bedrails?: A Little Help needed moving to and from a bed to a chair (including a wheelchair)?: A Little Help needed standing up from a chair using your arms (e.g., wheelchair or bedside chair)?: A Little Help needed to walk in hospital room?: A Little Help needed climbing 3-5 steps with a railing? : A Lot 6 Click Score: 17    End of Session Equipment Utilized During Treatment: Gait belt Activity Tolerance: Patient tolerated treatment well Patient  left: with chair alarm set;in chair;with call bell/phone within reach         Time: 1019-1043 PT Time Calculation (min) (ACUTE ONLY): 24 min  Charges:  $Gait Training: 8-22 mins $Therapeutic Exercise: 8-22 mins                    Chesley Noon, PTA 10/25/18, 10:51 AM

## 2018-10-25 NOTE — Consult Note (Signed)
  ANTICOAGULATION CONSULT NOTE - Initial Consult  Pharmacy Consult for Warfarin Dosing and Monitoring Indication: VTE Treatment   Patient Measurements: Height: 5\' 3"  (160 cm) Weight: 128 lb (58.1 kg) IBW/kg (Calculated) : 56.9  Vital Signs: Temp: 97.8 F (36.6 C) (09/17 0749) Temp Source: Oral (09/17 0749) BP: 157/74 (09/17 0749) Pulse Rate: 70 (09/17 0749)  Labs: Recent Labs    10/23/18 0417 10/24/18 0427 10/25/18 0411  HGB 10.0*  --   --   HCT 31.7*  --   --   PLT 177  --   --   LABPROT 38.2* 28.8* 23.0*  INR 4.0* 2.8* 2.1*  CREATININE 3.69* 3.85* 3.69*    Estimated Creatinine Clearance: 15 mL/min (A) (by C-G formula based on SCr of 3.69 mg/dL (H)).   Medical History: Past Medical History:  Diagnosis Date  . Anemia   . Antiphospholipid antibody syndrome ([place])   . Arthritis   . DVT (deep venous thrombosis) ([place])   . Essential hypertension   . Gastric ulcer   . GERD (gastroesophageal reflux disease)   . History of CMV   . Hyperlipidemia   . Hyperparathyroidism ([place])   . Pedal edema   . Peripheral vascular disease ([place])   . Rosacea   . Status post kidney transplant 2001  . Stroke ([place])   . TIA (transient ischemic attack)   . Vascular dementia without behavioral disturbance [facility])    Assessment: Pharmacy consulted for warfarin dosing and monitoring in 68  yo male with PMH of DVT. Patient has been admitted with UTI and Acute Renal Failure on CKD w/ PMH of kidney transplant. INR therapeutic on admission. Per wife, prefer to keep pt between INR 2.5 and 3.0 d/t APS and PMH of strokes. H&H stable, PLT trending up.  Home Regimen: Warfarin 3mg : Mon, Wed, Fri                              Warfarin 2mg : Cain Saupe, Sat, Sun   INR Warfarin dose given 9/8 2.4 2mg  9/9 2.5 3mg  9/10 2.5 2mg  9/11 2.6 3mg   9/12 3.1 2mg  9/13 3.6 HELD 9/14 4.8 HELD 9/15 4.0 HELD 9/16 2.8       1 mg 9/17     2.1       2 mg  Vitamin K Antagonists (eg, warfarin): Cephalosporins may  enhance the anticoagulant effect of Vitamin K Antagonists. Risk C: Monitor therapy - likely unexpected bump in INR due to this interaction  Goal of Therapy:  INR 2.5-3.0 Monitor platelets by anticoagulation protocol: Yes   Plan:   INR 2.1 and trending down  Will give Warfarin 2 mg and obtain INR and CBC with am labs  INRs daily per protocol while receiving antibiotic  CBC at least every 3 days per protocol.  Pharmacy will continue to follow and order dose based on levels.   Gerald Dexter, PharmD Pharmacy Resident  10/25/2018 8:36 AM

## 2018-10-25 NOTE — Progress Notes (Signed)
Patient and his wife asking about discharge. Per Dr. Stark Jock; "I saw the note from infectious disease and recommended IV antibiotics till 10/30/18. I understand his oxygen saturation dropped to the 80s on room air last night. He is getting better but I do not think he is stable enough for discharge yet. Will discuss with case manager regarding safe discharge plans" Updated wife and patient.

## 2018-10-25 NOTE — Progress Notes (Signed)
[facility]  ROUNDING NOTE   Subjective:   Lab Results  Component Value Date   CREATININE 3.69 (H) 10/25/2018   CREATININE 3.85 (H) 10/24/2018   CREATININE 3.69 (H) 10/23/2018   [name] doing fair, sitting up in chair at the side of bed Oral intake is also fair.  Trying to drink more water Serum creatinine is about the same at 3.69  Objective:  Vital signs in last 24 hours:  Temp:  [97.8 F (36.6 C)-98.6 F (37 C)] 97.8 F (36.6 C) (09/17 0749) Pulse Rate:  [70-83] 70 (09/17 0749) Resp:  [18] 18 (09/17 0009) BP: (145-159)/(69-77) 157/74 (09/17 0749) SpO2:  [88 %-96 %] 96 % (09/17 0749)  Weight change:  Filed Weights   10/15/18 2309  Weight: 58.1 kg    Intake/Output: I/O last 3 completed shifts: In: 720 [P.O.:720] Out: 1450 [Urine:1450]   Intake/Output this shift:  Total I/O In: 480 [P.O.:480] Out: 101 [Urine:100; Stool:1]  Physical Exam: General: NAD, sitting up in the chair  Head: Normocephalic, atraumatic. Moist oral mucosal membranes  Eyes: Anicteric,    Neck: Supple,   Lungs:   Coarse breath sounds at bases  Heart: Regular rate and rhythm  Abdomen:  Soft, nontender,   Extremities: no peripheral edema.  Neurologic: Nonfocal, moving all four extremities  Skin:  Scattered ecchymosis    Basic Metabolic Panel: Recent Labs  Lab 10/19/18 0619  10/20/18 0352 10/21/18 0038 10/22/18 0329 10/23/18 0417 10/24/18 0427 10/25/18 0411  NA 142  --  140 139 140 143 143 144  K 3.3*  --  3.5 4.1 4.0 3.4* 3.2* 3.9  CL 112*  --  108 105 103 103 102 104  CO2 23  --  24 23 25 28 29 28   GLUCOSE 97  --  91 104* 134* 159* 121* 102*  BUN 31*  --  33* 39* 47* 56* 60* 63*  CREATININE 3.45*  --  3.54* 3.72* 3.88* 3.69* 3.85* 3.69*  CALCIUM 8.5*  --  8.5* 8.9 9.2 9.1 9.1 9.3  MG  --    < > 1.9 1.9  --  2.0 1.8 1.9  PHOS 1.6*  --  3.4 3.4  3.4 3.7 3.0  --   --    < > = values in this interval not displayed.    Liver Function Tests: Recent Labs  Lab  10/19/18 0619 10/21/18 0038 10/22/18 0329  ALBUMIN 3.1* 3.3* 3.4*   No results for input(s): LIPASE, AMYLASE in the last 168 hours. No results for input(s): AMMONIA in the last 168 hours.  CBC: Recent Labs  Lab 10/19/18 0619 10/21/18 0038 10/22/18 0329 10/23/18 0417  WBC 8.0 9.7 5.0 7.9  HGB 9.9* 9.7* 10.3* 10.0*  HCT 32.0* 30.9* 32.8* 31.7*  MCV 91.4 91.7 90.4 90.1  PLT 109* 126* 146* 177    Cardiac Enzymes: No results for input(s): CKTOTAL, CKMB, CKMBINDEX, TROPONINI in the last 168 hours.  BNP: Invalid input(s): POCBNP  CBG: No results for input(s): GLUCAP in the last 168 hours.  Microbiology: Results for orders placed or performed during the hospital encounter of 10/16/18  Urine culture     Status: None   Collection Time: 10/16/18  2:26 AM   Specimen: Urine, Random  Result Value Ref Range Status   Specimen Description   Final    URINE, RANDOM Performed at [facility], [address], [place], [place] [address]    Special Requests   Final    NONE Performed  at [facility], [address] [place], [place] [address]    Culture   Final    NO GROWTH Performed at [facility], [address], [place], [place] [address]    Report Status 10/17/2018 FINAL  Final  Blood culture (routine x 2)     Status: None   Collection Time: 10/16/18  2:26 AM   Specimen: BLOOD  Result Value Ref Range Status   Specimen Description BLOOD LEFT FA  Final   Special Requests   Final    BOTTLES DRAWN AEROBIC AND ANAEROBIC Blood Culture adequate volume   Culture   Final    NO GROWTH 5 DAYS Performed at [facility], [address] [place], [place] [address]    Report Status 10/21/2018 FINAL  Final  Blood culture (routine x 2)     Status: Abnormal   Collection Time: 10/16/18  2:26 AM   Specimen: BLOOD  Result Value Ref Range Status   Specimen Description   Final    BLOOD LEFT FA Performed at [facility], [address]  [place], [place] [address]    Special Requests   Final    BOTTLES DRAWN AEROBIC AND ANAEROBIC Blood Culture adequate volume Performed at [facility], [address] [place], [place] [address]    Culture  Setup Time   Final    ANAEROBIC BOTTLE ONLY GRAM NEGATIVE RODS CRITICAL RESULT CALLED TO, READ BACK BY AND VERIFIED WITH: Dustin Folks AT [id number] ON 10/16/2018 BY JPM Performed at [facility], [place] [address], [place], [place] [address]    Culture KLEBSIELLA OXYTOCA (A)  Final   Report Status 10/18/2018 FINAL  Final   Organism ID, Bacteria KLEBSIELLA OXYTOCA  Final      Susceptibility   Klebsiella oxytoca - MIC*    AMPICILLIN >=32 RESISTANT Resistant     CEFAZOLIN <=4 SENSITIVE Sensitive     CEFEPIME <=1 SENSITIVE Sensitive     CEFTAZIDIME <=1 SENSITIVE Sensitive     CEFTRIAXONE <=1 SENSITIVE Sensitive     CIPROFLOXACIN <=0.25 SENSITIVE Sensitive     GENTAMICIN <=1 SENSITIVE Sensitive     IMIPENEM <=0.25 SENSITIVE Sensitive     TRIMETH/SULFA <=20 SENSITIVE Sensitive     AMPICILLIN/SULBACTAM 8 SENSITIVE Sensitive     PIP/TAZO <=4 SENSITIVE Sensitive     * KLEBSIELLA OXYTOCA  SARS Coronavirus 2 [facility] order, Performed in [facility] hospital lab) Nasopharyngeal Nasopharyngeal Swab     Status: None   Collection Time: 10/16/18  2:26 AM   Specimen: Nasopharyngeal Swab  Result Value Ref Range Status   SARS Coronavirus 2 NEGATIVE NEGATIVE Final    Comment: (NOTE) If result is NEGATIVE SARS-CoV-2 target nucleic acids are NOT DETECTED. The SARS-CoV-2 RNA is generally detectable in upper and lower  respiratory specimens during the acute phase of infection. The lowest  concentration of SARS-CoV-2 viral copies this assay can detect is 250  copies / mL. A negative result does not preclude SARS-CoV-2 infection  and should not be used as the sole basis for treatment or other  patient management decisions.  A negative result may occur with  improper specimen collection / handling,  submission of specimen other  than nasopharyngeal swab, presence of viral mutation(s) within the  areas targeted by this assay, and inadequate number of viral copies  (<250 copies / mL). A negative result must be combined with clinical  observations, patient history, and epidemiological information. If result is POSITIVE SARS-CoV-2 target nucleic  acids are DETECTED. The SARS-CoV-2 RNA is generally detectable in upper and lower  respiratory specimens dur ing the acute phase of infection.  Positive  results are indicative of active infection with SARS-CoV-2.  Clinical  correlation with patient history and other diagnostic information is  necessary to determine patient infection status.  Positive results do  not rule out bacterial infection or co-infection with other viruses. If result is PRESUMPTIVE POSTIVE SARS-CoV-2 nucleic acids MAY BE PRESENT.   A presumptive positive result was obtained on the submitted specimen  and confirmed on repeat testing.  While 2019 novel coronavirus  (SARS-CoV-2) nucleic acids may be present in the submitted sample  additional confirmatory testing may be necessary for epidemiological  and / or clinical management purposes  to differentiate between  SARS-CoV-2 and other Sarbecovirus currently known to infect humans.  If clinically indicated additional testing with an alternate test  methodology [phone]) is advised. The SARS-CoV-2 RNA is generally  detectable in upper and lower respiratory sp ecimens during the acute  phase of infection. The expected result is Negative. Fact Sheet for Patients:  StrictlyIdeas.no Fact Sheet for Healthcare Providers: BankingDealers.co.za This test is not yet approved or cleared by the [place] FDA and has been authorized for detection and/or diagnosis of SARS-CoV-2 by FDA under an Emergency Use Authorization (EUA).  This EUA will remain in effect (meaning this test can be  used) for the duration of the COVID-19 declaration under Section 564(b)(1) of the Act, 21 U.S.C. section 360bbb-3(b)(1), unless the authorization is terminated or revoked sooner. Performed at [facility], [address] [place],  [address]   Blood Culture ID Panel (Reflexed)     Status: Abnormal   Collection Time: 10/16/18  2:26 AM  Result Value Ref Range Status   Enterococcus species NOT DETECTED NOT DETECTED Final   Listeria monocytogenes NOT DETECTED NOT DETECTED Final   Staphylococcus species NOT DETECTED NOT DETECTED Final   Staphylococcus aureus (BCID) NOT DETECTED NOT DETECTED Final   Streptococcus species NOT DETECTED NOT DETECTED Final   Streptococcus agalactiae NOT DETECTED NOT DETECTED Final   Streptococcus pneumoniae NOT DETECTED NOT DETECTED Final   Streptococcus pyogenes NOT DETECTED NOT DETECTED Final   Acinetobacter baumannii NOT DETECTED NOT DETECTED Final   Enterobacteriaceae species DETECTED (A) NOT DETECTED Final    Comment: Enterobacteriaceae represent a large family of gram-negative bacteria, not a single organism. CRITICAL RESULT CALLED TO, READ BACK BY AND VERIFIED WITH: ELLINGTON,A AT [id number] ON 10/16/2018 BY JPM    Enterobacter cloacae complex NOT DETECTED NOT DETECTED Final   Escherichia coli NOT DETECTED NOT DETECTED Final   Klebsiella oxytoca DETECTED (A) NOT DETECTED Final    Comment: CRITICAL RESULT CALLED TO, READ BACK BY AND VERIFIED WITH: ELLINGTON,A AT 1747 ON 10/16/2018 BY JPM    Klebsiella pneumoniae NOT DETECTED NOT DETECTED Final   Proteus species NOT DETECTED NOT DETECTED Final   Serratia marcescens NOT DETECTED NOT DETECTED Final   Carbapenem resistance NOT DETECTED NOT DETECTED Final   Haemophilus influenzae NOT DETECTED NOT DETECTED Final   Neisseria meningitidis NOT DETECTED NOT DETECTED Final   Pseudomonas aeruginosa NOT DETECTED NOT DETECTED Final   Candida albicans NOT DETECTED NOT DETECTED Final   Candida glabrata NOT  DETECTED NOT DETECTED Final   Candida krusei NOT DETECTED NOT DETECTED Final   Candida parapsilosis NOT DETECTED NOT DETECTED Final   Candida tropicalis NOT DETECTED NOT DETECTED Final    Comment: Performed at [facility], [address] [place], [place] [address]  CULTURE, BLOOD (ROUTINE X 2) w Reflex to ID Panel     Status: None (Preliminary result)   Collection Time: 10/21/18 12:38 AM   Specimen: BLOOD  Result Value Ref Range Status   Specimen Description BLOOD RIGHT ANTECUBITAL  Final   Special Requests   Final    BOTTLES DRAWN AEROBIC AND ANAEROBIC Blood Culture adequate volume   Culture   Final    NO GROWTH 4 DAYS Performed at [facility], [address] [place], [place] [address]    Report Status PENDING  Incomplete  CULTURE, BLOOD (ROUTINE X 2) w Reflex to ID Panel     Status: None (Preliminary result)   Collection Time: 10/21/18 12:38 AM   Specimen: BLOOD  Result Value Ref Range Status   Specimen Description BLOOD LEFT ANTECUBITAL  Final   Special Requests   Final    BOTTLES DRAWN AEROBIC AND ANAEROBIC Blood Culture adequate volume   Culture   Final    NO GROWTH 4 DAYS Performed at [facility], [address] [place], [place] [address]    Report Status PENDING  Incomplete    Coagulation Studies: Recent Labs    10/23/18 0417 10/24/18 0427 10/25/18 0411  LABPROT 38.2* 28.8* 23.0*  INR 4.0* 2.8* 2.1*    Urinalysis: No results for input(s): COLORURINE, LABSPEC, PHURINE, GLUCOSEU, HGBUR, BILIRUBINUR, KETONESUR, PROTEINUR, UROBILINOGEN, NITRITE, LEUKOCYTESUR in the last 72 hours.  Invalid input(s): APPERANCEUR    Imaging: No results found.   Medications:   . cefTRIAXone (ROCEPHIN)  IV 2 g (10/25/18 1005)   . amLODipine  10 mg Oral Daily  . buPROPion  300 mg Oral Daily  . calcitRIOL  0.25 mcg Oral QHS  . citalopram  20 mg Oral Daily  . dicyclomine  10 mg Oral TID AC  . donepezil  10 mg Oral QHS  . labetalol  200 mg  Oral BID  . magnesium oxide  400 mg Oral BID  . mometasone-formoterol  2 puff Inhalation BID  . mycophenolate  250 mg Oral q morning - 10a   And  . mycophenolate  500 mg Oral QHS  . pantoprazole  40 mg Oral Daily  . predniSONE  5 mg Oral Q breakfast  . tacrolimus  0.5 mg Oral BID  . warfarin  1 mg Oral ONCE-1800  . Warfarin - Pharmacist Dosing Inpatient   Does not apply q1800   acetaminophen **OR** acetaminophen, albuterol, bisacodyl, diphenoxylate-atropine, hydrALAZINE, HYDROcodone-acetaminophen, ondansetron **OR** ondansetron (ZOFRAN) IV  Assessment/ Plan:  [name] is a 68 y.o. 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 Acute renal failure with metabolic acidosis on Chronic kidney disease stage IVT Status post renal transplantation in 2001.  His baseline creatinine is 2.8, GFR of 23 on 03/27/18. Acute renal failure seems secondary to prerenal azotemia and current infection.  Followed by Dr. Alger Simons, [facility]. - Continue mycophenolate, tacrolimus and prednisone. - tac level pending  2. Urinary tract infection: Klebsiella oxytoca.   -Currently being treated with IV ceftriaxone.   3. Anemia of chronic kidney disease: Lab Results  Component Value Date   HGB 10.0 (  L) 10/23/2018       LOS: 9 Rudolf Blizard 9/17/202011:54 AM

## 2018-10-26 DIAGNOSIS — G9340 Encephalopathy, unspecified: Secondary | ICD-10-CM

## 2018-10-26 DIAGNOSIS — D638 Anemia in other chronic diseases classified elsewhere: Secondary | ICD-10-CM

## 2018-10-26 DIAGNOSIS — J449 Chronic obstructive pulmonary disease, unspecified: Secondary | ICD-10-CM

## 2018-10-26 LAB — CBC
HCT: 34.4 % — ABNORMAL LOW (ref 39.0–52.0)
Hemoglobin: 10.5 g/dL — ABNORMAL LOW (ref 13.0–17.0)
MCH: 28.2 pg (ref 26.0–34.0)
MCHC: 30.5 g/dL (ref 30.0–36.0)
MCV: 92.2 fL (ref 80.0–100.0)
Platelets: 179 10*3/uL (ref 150–400)
RBC: 3.73 MIL/uL — ABNORMAL LOW (ref 4.22–5.81)
RDW: 13.1 % (ref 11.5–15.5)
WBC: 6.3 10*3/uL (ref 4.0–10.5)
nRBC: 0 % (ref 0.0–0.2)

## 2018-10-26 LAB — CULTURE, BLOOD (ROUTINE X 2)
Culture: NO GROWTH
Culture: NO GROWTH
Special Requests: ADEQUATE
Special Requests: ADEQUATE

## 2018-10-26 LAB — BASIC METABOLIC PANEL
Anion gap: 8 (ref 5–15)
BUN: 55 mg/dL — ABNORMAL HIGH (ref 8–23)
CO2: 28 mmol/L (ref 22–32)
Calcium: 9.2 mg/dL (ref 8.9–10.3)
Chloride: 106 mmol/L (ref 98–111)
Creatinine, Ser: 3.25 mg/dL — ABNORMAL HIGH (ref 0.61–1.24)
GFR calc Af Amer: 21 mL/min — ABNORMAL LOW (ref >60)
GFR calc non Af Amer: 18 mL/min — ABNORMAL LOW (ref >60)
Glucose, Bld: 84 mg/dL (ref 70–99)
Potassium: 4 mmol/L (ref 3.5–5.1)
Sodium: 142 mmol/L (ref 135–145)

## 2018-10-26 LAB — HEPARIN LEVEL (UNFRACTIONATED): Heparin Unfractionated: 0.37 IU/mL (ref 0.30–0.70)

## 2018-10-26 LAB — PROTIME-INR
INR: 1.7 — ABNORMAL HIGH (ref 0.8–1.2)
Prothrombin Time: 19.4 seconds — ABNORMAL HIGH (ref 11.4–15.2)

## 2018-10-26 MED ORDER — HEPARIN (PORCINE) 25000 UT/250ML-% IV SOLN
850.0000 [IU]/h | INTRAVENOUS | Status: AC
  Administered 2018-10-26 – 2018-10-27 (×2): 1000 [IU]/h via INTRAVENOUS
  Administered 2018-10-28 – 2018-10-29 (×2): 850 [IU]/h via INTRAVENOUS
  Filled 2018-10-26 (×4): qty 250

## 2018-10-26 MED ORDER — WARFARIN SODIUM 2 MG PO TABS
2.0000 mg | ORAL_TABLET | Freq: Once | ORAL | Status: AC
  Administered 2018-10-26: 2 mg via ORAL
  Filled 2018-10-26: qty 1

## 2018-10-26 NOTE — Consult Note (Signed)
  ANTICOAGULATION CONSULT NOTE - Initial Consult  Pharmacy Consult for Warfarin Dosing and Monitoring Indication: VTE Treatment   Patient Measurements: Height: 5\' 3"  (160 cm) Weight: 128 lb (58.1 kg) IBW/kg (Calculated) : 56.9  Vital Signs: Temp: 98.3 F (36.8 C) (09/17 2357) Temp Source: Oral (09/17 2357) BP: 151/73 (09/17 2357) Pulse Rate: 71 (09/17 2357)  Labs: Recent Labs    10/24/18 0427 10/25/18 0411 10/26/18 0419  HGB  --   --  10.5*  HCT  --   --  34.4*  PLT  --   --  179  LABPROT 28.8* 23.0* 19.4*  INR 2.8* 2.1* 1.7*  CREATININE 3.85* 3.69* 3.25*    Estimated Creatinine Clearance: 17 mL/min (A) (by C-G formula based on SCr of 3.25 mg/dL (H)).   Medical History: Past Medical History:  Diagnosis Date  . Anemia   . Antiphospholipid antibody syndrome ([place])   . Arthritis   . DVT (deep venous thrombosis) ([place])   . Essential hypertension   . Gastric ulcer   . GERD (gastroesophageal reflux disease)   . History of CMV   . Hyperlipidemia   . Hyperparathyroidism ([place])   . Pedal edema   . Peripheral vascular disease ([place])   . Rosacea   . Status post kidney transplant 2001  . Stroke ([place])   . TIA (transient ischemic attack)   . Vascular dementia without behavioral disturbance [facility])    Assessment: Pharmacy consulted for warfarin dosing and monitoring in 68  yo male with PMH of DVT. Patient has been admitted with UTI and Acute Renal Failure on CKD w/ PMH of kidney transplant. INR therapeutic on admission. Per wife, prefer to keep pt between INR 2.5 and 3.0 d/t APS and PMH of strokes. H&H stable, PLT trending up.  Home Regimen: Warfarin 3mg : Mon, Wed, Fri                              Warfarin 2mg : Cain Saupe, Sat, Sun   INR Warfarin dose given 9/8 2.4 2mg  9/9 2.5 3mg  9/10 2.5 2mg  9/11 2.6 3mg   9/12 3.1 2mg  9/13 3.6 HELD 9/14 4.8 HELD 9/15 4.0 HELD 9/16 2.8       1 mg 9/17     2.1       2 mg 9/18     1.7       2 mg  Vitamin K Antagonists (eg,  warfarin): Cephalosporins may enhance the anticoagulant effect of Vitamin K Antagonists. Risk C: Monitor therapy - likely unexpected bump in INR due to this interaction Goal of Therapy:  INR 2.5-3.0 Monitor platelets by anticoagulation protocol: Yes   Plan:   INR 1.7 and trending down  Per MD, heparin will be added to bridge until INR is therapeutic >2.  Will give Warfarin 2 mg and obtain INR with am labs  INRs daily per protocol while receiving antibiotic  CBC at least every 3 days per protocol.  Pharmacy will continue to follow and order dose based on levels.   Gerald Dexter, PharmD Pharmacy Resident  10/26/2018 7:31 AM

## 2018-10-26 NOTE — Progress Notes (Signed)
ID Pt feeling better No sob No fever   O/E  Patient Vitals for the past 24 hrs:  BP Temp Temp src Pulse Resp SpO2  10/26/18 1548 (!) 154/74 98.2 F (36.8 C) Oral 72 16 94 %  10/26/18 1035 - - - 73 - 93 %  10/26/18 0754 138/82 98.2 F (36.8 C) Oral 78 20 92 %  10/25/18 2357 (!) 151/73 98.3 F (36.8 C) Oral 71 16 92 %   awake and alert, responding appropriately Chest B/l air entry A few basal crepts Abd soft No edema legs CNS non focal  CBC Latest Ref Rng & Units 10/26/2018 10/23/2018 10/22/2018  WBC 4.0 - 10.5 K/uL 6.3 7.9 5.0  Hemoglobin 13.0 - 17.0 g/dL 10.5(L) 10.0(L) 10.3(L)  Hematocrit 39.0 - 52.0 % 34.4(L) 31.7(L) 32.8(L)  Platelets 150 - 400 K/uL 179 177 146(L)    CMP Latest Ref Rng & Units 10/26/2018 10/25/2018 10/24/2018  Glucose 70 - 99 mg/dL 84 102(H) 121(H)  BUN 8 - 23 mg/dL 55(H) 63(H) 60(H)  Creatinine 0.61 - 1.24 mg/dL 3.25(H) 3.69(H) 3.85(H)  Sodium 135 - 145 mmol/L 142 144 143  Potassium 3.5 - 5.1 mmol/L 4.0 3.9 3.2(L)  Chloride 98 - 111 mmol/L 106 104 102  CO2 22 - 32 mmol/L 28 28 29   Calcium 8.9 - 10.3 mg/dL 9.2 9.3 9.1  Total Protein 6.5 - 8.1 g/dL - - -  Total Bilirubin 0.3 - 1.2 mg/dL - - -  Alkaline Phos 38 - 126 U/L - - -  AST 15 - 41 U/L - - -  ALT 0 - 44 U/L - - -   Micro 10/16/18 Klebsiella oxytoca blood culture  10/21/18- BC NG   Impression/Recmmendation   Klebsiella oxytoca bacteremia and UTI of  A living donor kidney. Is being treated with ceftriaxone for 2 weeks- last day 10/30/18 Antiphospholipid antibody syndrome with h/o CVA/TIA on coumadin- INR has to be between 2-3  Acute hypoxic resp failure due to pulmonary edema/COPD improving  Anemia of chronic disease  Encephalopathy : combination of cefepime and renal insufficiency- has resolved -   AKI on CKD -improving, followed by nephrologist  Renal transplant on cellcept, tacrolimus and prednisone Discussed the management with patient Will follow him peripherally this weekend

## 2018-10-26 NOTE — Care Management Important Message (Signed)
Important Message  Patient Details  Name: NEHEMIAS KNAFF MRN: [id number] Date of Birth: [birth year]   Medicare Important Message Given:  Yes     Juliann Pulse A Ferlando Lia 10/26/2018, 11:11 AM

## 2018-10-26 NOTE — Consult Note (Signed)
ANTICOAGULATION CONSULT NOTE   Pharmacy Consult for Heparin Indication: VTE treatment  Patient Measurements: Height: 5\' 3"  (160 cm) Weight: 128 lb (58.1 kg) IBW/kg (Calculated) : 56.9 Heparin Dosing Weight: TBW  Vital Signs: Temp: 98.2 F (36.8 C) (09/18 1548) Temp Source: Oral (09/18 1548) BP: 154/74 (09/18 1548) Pulse Rate: 72 (09/18 1548)  Labs: Recent Labs    10/24/18 0427 10/25/18 0411 10/26/18 0419 10/26/18 2213  HGB  --   --  10.5*  --   HCT  --   --  34.4*  --   PLT  --   --  179  --   LABPROT 28.8* 23.0* 19.4*  --   INR 2.8* 2.1* 1.7*  --   HEPARINUNFRC  --   --   --  0.37  CREATININE 3.85* 3.69* 3.25*  --     Estimated Creatinine Clearance: 17 mL/min (A) (by C-G formula based on SCr of 3.25 mg/dL (H)).  Medications:  Coumadin  Pharmacy consulted for heparin dosing and monitoring in 68  yo male with PMH of DVT. Patient has been admitted with UTI/bacteremia and Acute Renal Failure on CKD w/ PMH of kidney transplant.  [name] also on warfarin outpatient.  Per wife, prefer to keep pt between INR 2.5 and 3.0 d/t APS and PMH of strokes.   Assessment: INR is currently subtherapeutic at 1.7, and pharmacy has been consulted to bridge heparin (no bolus) until INR therapeutic > 2.    9/18 @ 2213 HL = 0.37, therapeutic x 1  Goal of Therapy:   Heparin level goal 0.3-0.7   Plan:   Continue heparin continuous infusion at 1000 units/hour  Heparin level ordered for 9/19 am w/ routine labs  Will order daily heparin level and CBC per protocol, and monitor platelets by anticoagulation protocol.  Ena Dawley, PharmD Pharmacy Resident  10/26/2018 10:47 PM

## 2018-10-26 NOTE — Progress Notes (Signed)
Physical Therapy Treatment Patient Details Name: Eric Gill MRN: [id number] DOB: February 22, 1950 Today's Date: 10/26/2018    History of Present Illness Pt admitted for renal failure with complaints of urinary frequency and weakness. History includes GERD, anemia, DVT, CVA, and vascular dementia.    PT Comments    Pt presented with deficits in strength, transfers, mobility, gait, balance, and activity tolerance.  Pt was Mod Ind with bed mobility tasks with extra time and effort but no physical assistance required.  Pt was CGA with fair stability during transfers when done with proper sequencing/hand placement but min A for stability when attempting to stand with hands on the RW.  Pt's SpO2 at rest on room air was 93% with HR 73 bpm.  After amb 40' on room air SpO2 dropped to 85% and took 90 sec to return to >/= 90%.  Nursing notified and instructed writer to attempt amb on 1 and 2 LO2/min.  Pt then ambulated 125' on 2LO2 with SpO2 92% and 125' on 1LO2/min with SpO2 91%, HR WNL throughout session, nursing notified.  Pt required occasional min A with amb to prevent LOB, most notably during sharp turns.  Pt will benefit from HHPT services upon discharge to safely address above deficits for decreased caregiver assistance and eventual return to PLOF.     Follow Up Recommendations  Home health PT;Supervision/Assistance - 24 hour;Supervision for mobility/OOB     Equipment Recommendations  Rolling walker with 5" wheels    Recommendations for Other Services       Precautions / Restrictions Precautions Precautions: Fall Restrictions Weight Bearing Restrictions: No    Mobility  Bed Mobility Overal bed mobility: Modified Independent             General bed mobility comments: use of bed rails and extra time/effot but able to complete without physical assist  Transfers Overall transfer level: Needs assistance Equipment used: Rolling walker (2 wheeled) Transfers: Sit to/from Stand Sit to  Stand: Min guard         General transfer comment: Mod verbal cues for proper hand placement  Ambulation/Gait Ambulation/Gait assistance: Min assist Gait Distance (Feet): 125 Feet Assistive device: Rolling walker (2 wheeled) Gait Pattern/deviations: Step-through pattern;Decreased step length - right;Decreased step length - left Gait velocity: decreased   General Gait Details: Amb 1 x 40', 2 x 125' with slow cadence and occasional min A for stability most notably during turns   Marine scientist [place] (Stroke Patients Only)       Balance Overall balance assessment: Needs assistance Sitting-balance support: Feet supported Sitting balance-Leahy Scale: Good     Standing balance support: Bilateral upper extremity supported;During functional activity Standing balance-Leahy Scale: Poor Standing balance comment: Min A for stability during functional activity in standing                            Cognition Arousal/Alertness: Awake/alert Behavior During Therapy: WFL for tasks assessed/performed Overall Cognitive Status: Within Functional Limits for tasks assessed                                        Exercises Total Joint Exercises Ankle Circles/Pumps: Strengthening;Both;10 reps;15 reps Quad Sets: Strengthening;Both;10 reps;15 reps Gluteal Sets: Strengthening;Both;10 reps Heel Slides: AROM;Both;10 reps Hip ABduction/ADduction: AROM;Both;10 reps  Long Arc Quad: Strengthening;Both;15 reps;10 reps Knee Flexion: Strengthening;Both;15 reps;10 reps Other Exercises Other Exercises: Multiple sit to/froms stand transfers with training for proper hand placement for imporved stability Other Exercises: HEP education and review for BLE APs, QS, GS, and LAQs x 10 each 5-6x/day    General Comments        Pertinent Vitals/Pain Pain Assessment: No/denies pain    Home Living                      Prior  Function            PT Goals (current goals can now be found in the care plan section) Progress towards PT goals: Progressing toward goals    Frequency    Min 2X/week      PT Plan Current plan remains appropriate    Co-evaluation              AM-PAC PT "6 Clicks" Mobility   Outcome Measure  Help needed turning from your back to your side while in a flat bed without using bedrails?: A Little Help needed moving from lying on your back to sitting on the side of a flat bed without using bedrails?: A Little Help needed moving to and from a bed to a chair (including a wheelchair)?: A Little Help needed standing up from a chair using your arms (e.g., wheelchair or bedside chair)?: A Little Help needed to walk in hospital room?: A Little Help needed climbing 3-5 steps with a railing? : A Little 6 Click Score: 18    End of Session Equipment Utilized During Treatment: Gait belt Activity Tolerance: Patient tolerated treatment well Patient left: with chair alarm set;in chair;with call bell/phone within reach;with SCD's reapplied Nurse Communication: Mobility status PT Visit Diagnosis: Muscle weakness (generalized) (M62.81);History of falling (Z91.81);Difficulty in walking, not elsewhere classified (R26.2);Pain;Unsteadiness on feet (R26.81) Pain - Right/Left: Right Pain - part of body: Ankle and joints of foot     Time: [id number] PT Time Calculation (min) (ACUTE ONLY): 42 min  Charges:  $Gait Training: 8-22 mins $Therapeutic Exercise: 8-22 mins $Therapeutic Activity: 8-22 mins                     D. Scott Latonia Conrow PT, DPT 10/26/18, 12:33 PM

## 2018-10-26 NOTE — Progress Notes (Signed)
SATURATION QUALIFICATIONS: (This note is used to comply with regulatory documentation for home oxygen)  Patient Saturations on Room Air at Rest = 92%  Patient Saturations on Room Air while Ambulating = 85%  Patient Saturations on 2 Liters of oxygen while Ambulating = 92%  Please briefly explain why patient needs home oxygen: Pt oxygen saturation desats when ambulating on room air.

## 2018-10-26 NOTE — Progress Notes (Signed)
[facility] at [place] NAME: Eric Gill    MR#:  [id number]  DATE OF BIRTH:  [birth year]  SUBJECTIVE:  CHIEF COMPLAINT:   Chief Complaint  Patient presents with  . Urinary Frequency  . Weakness  [name] feeling better.  No new complaint this morning.  Patient already weaned off oxygen with oxygen saturation of 92% on room air this morning.  REVIEW OF SYSTEMS:  Review of Systems  Constitutional: Negative for chills, fever and malaise/fatigue.  HENT: Negative for sore throat.   Eyes: Negative for blurred vision and double vision.  Respiratory: Negative for cough, hemoptysis, shortness of breath, wheezing and stridor.   Cardiovascular: Negative for chest pain, palpitations, orthopnea and leg swelling.  Gastrointestinal: Negative for abdominal pain, blood in stool, diarrhea, melena, nausea and vomiting.  Genitourinary: Negative for dysuria, flank pain and hematuria.  Musculoskeletal: Negative for back pain and joint pain.  Skin: Negative for rash.  Neurological: Negative for dizziness, sensory change, focal weakness, seizures, loss of consciousness, weakness and headaches.  Endo/Heme/Allergies: Negative for polydipsia.  Psychiatric/Behavioral: Negative for depression. The patient is not nervous/anxious.     DRUG ALLERGIES:   Allergies  Allergen Reactions  . Losartan Cough  . Statins Nausea And Vomiting and Other (See Comments)    Reaction:  Muscle pain    VITALS:  Blood pressure 138/82, pulse 73, temperature 98.2 F (36.8 C), temperature source Oral, resp. rate 20, height 5\' 3"  (1.6 m), weight 58.1 kg, SpO2 93 %. PHYSICAL EXAMINATION:  Physical Exam Constitutional:      General: He is not in acute distress. HENT:     Head: Normocephalic.     Mouth/Throat:     Mouth: Mucous membranes are moist.  Eyes:     General: No scleral icterus.    Conjunctiva/sclera: Conjunctivae normal.     Pupils: Pupils are equal, round, and  reactive to light.  Neck:     Musculoskeletal: Normal range of motion and neck supple.     Vascular: No JVD.     Trachea: No tracheal deviation.  Cardiovascular:     Rate and Rhythm: Normal rate and regular rhythm.     Heart sounds: Normal heart sounds. No murmur. No gallop.   Pulmonary:     Effort: Pulmonary effort is normal. No respiratory distress.     Breath sounds: No rales.  Abdominal:     General: Bowel sounds are normal. There is no distension.     Palpations: Abdomen is soft.     Tenderness: There is no abdominal tenderness. There is no rebound.  Musculoskeletal: Normal range of motion.        General: No tenderness.     Right lower leg: No edema.     Left lower leg: No edema.  Skin:    Findings: No erythema or rash.  Neurological:     General: No focal deficit present.     Mental Status: He is alert.     Cranial Nerves: No cranial nerve deficit.  Psychiatric:        Mood and Affect: Mood normal.    LABORATORY PANEL:  Male CBC Recent Labs  Lab 10/26/18 0419  WBC 6.3  HGB 10.5*  HCT 34.4*  PLT 179   ------------------------------------------------------------------------------------------------------------------ Chemistries  Recent Labs  Lab 10/25/18 0411 10/26/18 0419  NA 144 142  K 3.9 4.0  CL 104 106  CO2 28 28  GLUCOSE 102* 84  BUN 63* 55*  CREATININE  3.69* 3.25*  CALCIUM 9.3 9.2  MG 1.9  --    RADIOLOGY:  No results found. 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 Nephrologist following.  Recently diuresed with IV Lasix which is previously been discontinued due to increase in creatinine.  Renal function continues to improve.  Continue mycophenolate, tacrolimus and prednisone per Dr. Candiss Norse   HTN emergency continue current regimen blood pressures under control Blood pressure fairly controlled given patient's age.  UTI and bacteremia.  U/C: No growth.  Blood culture: Klebsiella oxytoca. Cefepime is discontinued,  changed to Rocephin .to continue IV antibiotics until 10/30/2018  Dr. Francee Nodal.  Acute metabolic encephalopathy Multifactorial due to ongoing infectious process and hypoxia Improved Seen by neurology  H/O DVT, PVD, CVA, coumadin PTD.  INR subtherapeutic at 1.7 this morning. [name] wife concerned that the last time patient's INR was subtherapeutic he had a stroke. I discussed with pharmacy.  Initiated heparin drip to bridge until INR therapeutic at at least 2.0 before discontinuation of heparin drip. Pharmacist assisting with dosing Coumadin.  Anemia of chronic disease.  Stable.  Hypokalemia.  Replaced   Hypophosphatemia.  Phosphate supplement.  Acute hypoxic respiratory failure due to acute diastolic CHF Secondary to pulmonary edema.  Patient slowly improving.  Diuretics previously discontinued due to worsening of renal function.  DVT prophylaxis ; patient on Coumadin   all the records are reviewed and case discussed with Care Management/Social Worker. Management plans discussed with the patient, and he is in in agreement. Called and updated patient's wife over the phone on treatment plans.  All questions were answered today.  CODE STATUS: Full Code  TOTAL TIME TAKING CARE OF THIS PATIENT: 52minutes.   More than 50% of the time was spent in counseling/coordination of care: YES  POSSIBLE D/C IN 4 DAYS, DEPENDING ON CLINICAL CONDITION.   Malon Siddall M.D on 10/26/2018 at 12:39 PM  Between 7am to 6pm - Pager - [phone]  After 6pm go to [email or web address] - [facility]

## 2018-10-26 NOTE — TOC Progression Note (Signed)
Transition of Care [facility]) - Progression Note    Patient Details  Name: Eric Gill MRN: [id number] Date of Birth: January 30, 1951  Transition of Care [facility]) CM/SW Contact  Claritza July, Lenice Llamas Phone Number: [phone]  10/26/2018, 3:11 PM  Clinical Narrative: Plan is for patient to D/C home when medically stable with [place]. Rolling walker and bedside commode has been delivered to patient's room. Clinical Social Worker (CSW) will continue to follow and assist as needed.     Expected Discharge Plan: [place] Barriers to Discharge: Continued Medical Work up  Expected Discharge Plan and Services Expected Discharge Plan: [place] In-house Referral: Clinical Social Work     Living arrangements for the past 2 months: [place]: PT [place]: [place] ([place]) Date [place]: 10/17/18   Representative spoke with at [place]: [place] ([place]) Interventions    Readmission Risk Interventions No flowsheet data found.

## 2018-10-26 NOTE — Progress Notes (Signed)
Nutrition Brief Note  Patient identified for LOS day 10  68 y.o. 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 Patient meets criteria for normal weight based on current BMI. Pt appears weight stable pta.   Current diet order is HH, patient is consuming approximately 100% of meals at this time. Labs and medications reviewed.   RD will hold off on nutrition supplements for now as pt with CKD IV, eating well and does not appear to be malnourished. If nutrition issues arise, please consult RD.   Koleen Distance MS, RD, LDN Pager #- [phone] Office#- [phone] After Hours Pager: [phone]

## 2018-10-26 NOTE — Consult Note (Signed)
ANTICOAGULATION CONSULT NOTE - Initial Consult  Pharmacy Consult for Heparin Indication: VTE treatment  Patient Measurements: Height: 5\' 3"  (160 cm) Weight: 128 lb (58.1 kg) IBW/kg (Calculated) : 56.9 Heparin Dosing Weight: TBW  Vital Signs: Temp: 98.2 F (36.8 C) (09/18 1548) Temp Source: Oral (09/18 1548) BP: 154/74 (09/18 1548) Pulse Rate: 72 (09/18 1548)  Labs: Recent Labs    10/24/18 0427 10/25/18 0411 10/26/18 0419  HGB  --   --  10.5*  HCT  --   --  34.4*  PLT  --   --  179  LABPROT 28.8* 23.0* 19.4*  INR 2.8* 2.1* 1.7*  CREATININE 3.85* 3.69* 3.25*    Estimated Creatinine Clearance: 17 mL/min (A) (by C-G formula based on SCr of 3.25 mg/dL (H)).  Medications:  Coumadin  Pharmacy consulted for heparin dosing and monitoring in 68  yo male with PMH of DVT. Patient has been admitted with UTI/bacteremia and Acute Renal Failure on CKD w/ PMH of kidney transplant.  [name] also on warfarin outpatient.  Per wife, prefer to keep pt between INR 2.5 and 3.0 d/t APS and PMH of strokes.   Assessment: INR is currently subtherapeutic at 1.7, and pharmacy has been consulted to bridge heparin (no bolus) until INR therapeutic > 2.    Goal of Therapy:   Heparin level goal 0.3-0.7   Plan:   No bolus per MD  Start heparin continuous infusion at 1000 units/hour  Heparin level ordered for 9/18 @ 2200  Will order daily heparin level and CBC per protocol, and monitor platelets by anticoagulation protocol.  Gerald Dexter, PharmD Pharmacy Resident  10/26/2018 4:32 PM

## 2018-10-26 NOTE — Consult Note (Signed)
PHARMACY CONSULT NOTE - FOLLOW UP  Pharmacy Consult for Electrolyte Monitoring and Replacement   Recent Labs: Potassium (mmol/L)  Date Value  10/26/2018 4.0   Magnesium (mg/dL)  Date Value  10/25/2018 1.9   Calcium (mg/dL)  Date Value  10/26/2018 9.2   Albumin (g/dL)  Date Value  10/22/2018 3.4 (L)   Phosphorus (mg/dL)  Date Value  10/23/2018 3.0   Sodium (mmol/L)  Date Value  10/26/2018 142    Assessment -K 4.0  Goal WNL. Currently therapeutic -Mg 1.9.  Goal WNL.  Currently therapeutic.   -Phos 3.0.  Goal WNL.  Currently therapeutic.  -Furosemide 60mg  IV BID discontinued 9/16.   Patient home regimen is Furosemide 40mg  qd with a total of 83meq KCl daily replenishment, as well as magnesium oxide 400 mg BID.  Plan No replenishment needed at this time.  Pharmacy will defer to primary treatment team for labs, but will continue to follow consult.  Will replace to maintain electrolytes within normal limits.   Gerald Dexter, PharmD Pharmacy Resident  10/26/2018 7:24 AM

## 2018-10-27 LAB — PROTIME-INR
INR: 1.9 — ABNORMAL HIGH (ref 0.8–1.2)
Prothrombin Time: 21.8 seconds — ABNORMAL HIGH (ref 11.4–15.2)

## 2018-10-27 LAB — BASIC METABOLIC PANEL
Anion gap: 6 (ref 5–15)
BUN: 47 mg/dL — ABNORMAL HIGH (ref 8–23)
CO2: 27 mmol/L (ref 22–32)
Calcium: 9 mg/dL (ref 8.9–10.3)
Chloride: 110 mmol/L (ref 98–111)
Creatinine, Ser: 2.9 mg/dL — ABNORMAL HIGH (ref 0.61–1.24)
GFR calc Af Amer: 24 mL/min — ABNORMAL LOW (ref >60)
GFR calc non Af Amer: 21 mL/min — ABNORMAL LOW (ref >60)
Glucose, Bld: 91 mg/dL (ref 70–99)
Potassium: 4.2 mmol/L (ref 3.5–5.1)
Sodium: 143 mmol/L (ref 135–145)

## 2018-10-27 LAB — CBC
HCT: 33.7 % — ABNORMAL LOW (ref 39.0–52.0)
Hemoglobin: 10.3 g/dL — ABNORMAL LOW (ref 13.0–17.0)
MCH: 28 pg (ref 26.0–34.0)
MCHC: 30.6 g/dL (ref 30.0–36.0)
MCV: 91.6 fL (ref 80.0–100.0)
Platelets: 169 10*3/uL (ref 150–400)
RBC: 3.68 MIL/uL — ABNORMAL LOW (ref 4.22–5.81)
RDW: 13.1 % (ref 11.5–15.5)
WBC: 7.4 10*3/uL (ref 4.0–10.5)
nRBC: 0 % (ref 0.0–0.2)

## 2018-10-27 LAB — TACROLIMUS LEVEL: Tacrolimus (FK506) - LabCorp: 5.2 ng/mL (ref 2.0–20.0)

## 2018-10-27 LAB — HEPARIN LEVEL (UNFRACTIONATED): Heparin Unfractionated: 0.56 IU/mL (ref 0.30–0.70)

## 2018-10-27 LAB — MAGNESIUM: Magnesium: 2 mg/dL (ref 1.7–2.4)

## 2018-10-27 MED ORDER — WARFARIN SODIUM 2.5 MG PO TABS
2.5000 mg | ORAL_TABLET | Freq: Once | ORAL | Status: AC
  Administered 2018-10-27: 2.5 mg via ORAL
  Filled 2018-10-27: qty 1

## 2018-10-27 NOTE — Consult Note (Signed)
ANTICOAGULATION CONSULT NOTE   Pharmacy Consult for Heparin Indication: VTE treatment  Patient Measurements: Height: 5\' 3"  (160 cm) Weight: 128 lb (58.1 kg) IBW/kg (Calculated) : 56.9 Heparin Dosing Weight: TBW  Vital Signs: Temp: 98.9 F (37.2 C) (09/18 2326) Temp Source: Oral (09/18 2326) BP: 159/78 (09/18 2326) Pulse Rate: 71 (09/18 2326)  Labs: Recent Labs    10/25/18 0411 10/26/18 0419 10/26/18 2213 10/27/18 0555  HGB  --  10.5*  --  10.3*  HCT  --  34.4*  --  33.7*  PLT  --  179  --  169  LABPROT 23.0* 19.4*  --  21.8*  INR 2.1* 1.7*  --  1.9*  HEPARINUNFRC  --   --  0.37 0.56  CREATININE 3.69* 3.25*  --  2.90*    Estimated Creatinine Clearance: 19.1 mL/min (A) (by C-G formula based on SCr of 2.9 mg/dL (H)).  Medications:  Coumadin  Pharmacy consulted for heparin dosing and monitoring in 68  yo male with PMH of DVT. Patient has been admitted with UTI/bacteremia and Acute Renal Failure on CKD w/ PMH of kidney transplant.  [name] also on warfarin outpatient.  Per wife, prefer to keep pt between INR 2.5 and 3.0 d/t APS and PMH of strokes.   Assessment: INR is currently subtherapeutic at 1.7, and pharmacy has been consulted to bridge heparin (no bolus) until INR therapeutic > 2.    9/18 @ 2213 HL = 0.37, therapeutic x 1 9/19 @ 0555 HL = 0.56, therapeutic x 2 (INR = 1.9), CBC appears stable.  Goal of Therapy:   Heparin level goal 0.3-0.7   Plan:   Continue heparin continuous infusion at 1000 units/hour  Heparin level ordered for 9/20 am w/ routine labs  Will order daily heparin level and CBC per protocol, and monitor platelets by anticoagulation protocol.  Ena Dawley, PharmD 10/27/2018 6:41 AM

## 2018-10-27 NOTE — Consult Note (Signed)
  ANTICOAGULATION CONSULT NOTE - Initial Consult  Pharmacy Consult for Warfarin Dosing and Monitoring Indication: VTE Treatment   Patient Measurements: Height: 5\' 3"  (160 cm) Weight: 128 lb (58.1 kg) IBW/kg (Calculated) : 56.9  Vital Signs: Temp: 98.5 F (36.9 C) (09/19 0730) Temp Source: Oral (09/19 0730) BP: 175/73 (09/19 0730) Pulse Rate: 73 (09/19 0730)  Labs: Recent Labs    10/25/18 0411 10/26/18 0419 10/26/18 2213 10/27/18 0555  HGB  --  10.5*  --  10.3*  HCT  --  34.4*  --  33.7*  PLT  --  179  --  169  LABPROT 23.0* 19.4*  --  21.8*  INR 2.1* 1.7*  --  1.9*  HEPARINUNFRC  --   --  0.37 0.56  CREATININE 3.69* 3.25*  --  2.90*    Estimated Creatinine Clearance: 19.1 mL/min (A) (by C-G formula based on SCr of 2.9 mg/dL (H)).   Medical History: Past Medical History:  Diagnosis Date  . Anemia   . Antiphospholipid antibody syndrome ([place])   . Arthritis   . DVT (deep venous thrombosis) ([place])   . Essential hypertension   . Gastric ulcer   . GERD (gastroesophageal reflux disease)   . History of CMV   . Hyperlipidemia   . Hyperparathyroidism ([place])   . Pedal edema   . Peripheral vascular disease ([place])   . Rosacea   . Status post kidney transplant 2001  . Stroke ([place])   . TIA (transient ischemic attack)   . Vascular dementia without behavioral disturbance [facility])    Assessment: Pharmacy consulted for warfarin dosing and monitoring in 68  yo male with PMH of DVT. Patient has been admitted with UTI and Acute Renal Failure on CKD w/ PMH of kidney transplant. INR therapeutic on admission. Per wife, prefer to keep pt between INR 2.5 and 3.0 d/t APS and PMH of strokes. H&H stable, PLT trending up.  Home Regimen: Warfarin 3mg : Mon, Wed, Fri                              Warfarin 2mg : [place], Sat, Sun   INR Warfarin dose given 9/8 2.4 2mg  9/9 2.5 3mg  9/10 2.5 2mg  9/11 2.6 3mg   9/12 3.1 2mg  9/13 3.6 HELD 9/14 4.8 HELD 9/15 4.0 HELD 9/16 2.8       1 mg 9/17      2.1       2 mg 9/18     1.7       2 mg 9/19     1.9  Vitamin K Antagonists (eg, warfarin): Cephalosporins may enhance the anticoagulant effect of Vitamin K Antagonists. Risk C: Monitor therapy - likely unexpected bump in INR due to this interaction Goal of Therapy:  INR 2.5-3.0 Monitor platelets by anticoagulation protocol: Yes   Plan:   INR 1.9  Per MD, heparin will be added to bridge until INR is therapeutic >2.  Will give Warfarin 2.5 mg and obtain INR with am labs  INRs daily per protocol while receiving antibiotic  CBC at least every 3 days per protocol.  Pharmacy will continue to follow and order dose based on levels.   Olivia Canter, [facility] 10/27/2018 8:51 AM

## 2018-10-27 NOTE — Consult Note (Signed)
PHARMACY CONSULT NOTE - FOLLOW UP  Pharmacy Consult for Electrolyte Monitoring and Replacement   Recent Labs: Potassium (mmol/L)  Date Value  10/27/2018 4.2   Magnesium (mg/dL)  Date Value  10/27/2018 2.0   Calcium (mg/dL)  Date Value  10/27/2018 9.0   Albumin (g/dL)  Date Value  10/22/2018 3.4 (L)   Phosphorus (mg/dL)  Date Value  10/23/2018 3.0   Sodium (mmol/L)  Date Value  10/27/2018 143    Assessment -K 4.2  Goal WNL. Currently therapeutic -Mg 2.0.  Goal WNL.  Currently therapeutic.     Patient home regimen is Furosemide 40mg  qd with a total of 23meq KCl daily replenishment, as well as magnesium oxide 400 mg BID.  Plan No replenishment needed at this time.  Pharmacy will defer to primary treatment team for labs, but will continue to follow consult.  Will replace to maintain electrolytes within normal limits.   Olivia Canter, [facility]   10/27/2018 8:55 AM

## 2018-10-27 NOTE — Progress Notes (Signed)
[facility]  ROUNDING NOTE   Subjective:   Lab Results  Component Value Date   CREATININE 2.90 (H) 10/27/2018   CREATININE 3.25 (H) 10/26/2018   CREATININE 3.69 (H) 10/25/2018   [name] doing fair, lying in the bed Oral intake is also fair.  Serum creatinine has improved to 2.90 Denies any acute shortness of breath  Objective:  Vital signs in last 24 hours:  Temp:  [98.2 F (36.8 C)-98.9 F (37.2 C)] 98.5 F (36.9 C) (09/19 0730) Pulse Rate:  [71-73] 73 (09/19 0730) Resp:  [16-19] 19 (09/18 2326) BP: (154-175)/(73-78) 175/73 (09/19 0730) SpO2:  [93 %-94 %] 93 % (09/19 0730)  Weight change:  Filed Weights   10/15/18 2309  Weight: 58.1 kg    Intake/Output: I/O last 3 completed shifts: In: 845.7 [P.O.:480; I.V.:154.7; IV Piggyback:211] Out: 1080 [Urine:1080]   Intake/Output this shift:  Total I/O In: 376.7 [P.O.:360; I.V.:16.7] Out: 400 [Urine:400]  Physical Exam: General: NAD,    Head: Normocephalic, atraumatic. Moist oral mucosal membranes  Eyes: Anicteric,    Neck: Supple,   Lungs:   Coarse breath sounds at bases  Heart: Regular rate and rhythm  Abdomen:  Soft, nontender,   Extremities: no peripheral edema.  Neurologic: Nonfocal, moving all four extremities  Skin:  Scattered ecchymosis    Basic Metabolic Panel: Recent Labs  Lab 10/21/18 0038 10/22/18 0329 10/23/18 0417 10/24/18 0427 10/25/18 0411 10/26/18 0419 10/27/18 0555  NA 139 140 143 143 144 142 143  K 4.1 4.0 3.4* 3.2* 3.9 4.0 4.2  CL 105 103 103 102 104 106 110  CO2 23 25 28 29 28 28 27   GLUCOSE 104* 134* 159* 121* 102* 84 91  BUN 39* 47* 56* 60* 63* 55* 47*  CREATININE 3.72* 3.88* 3.69* 3.85* 3.69* 3.25* 2.90*  CALCIUM 8.9 9.2 9.1 9.1 9.3 9.2 9.0  MG 1.9  --  2.0 1.8 1.9  --  2.0  PHOS 3.4  3.4 3.7 3.0  --   --   --   --     Liver Function Tests: Recent Labs  Lab 10/21/18 0038 10/22/18 0329  ALBUMIN 3.3* 3.4*   No results for input(s): LIPASE, AMYLASE in  the last 168 hours. No results for input(s): AMMONIA in the last 168 hours.  CBC: Recent Labs  Lab 10/21/18 0038 10/22/18 0329 10/23/18 0417 10/26/18 0419 10/27/18 0555  WBC 9.7 5.0 7.9 6.3 7.4  HGB 9.7* 10.3* 10.0* 10.5* 10.3*  HCT 30.9* 32.8* 31.7* 34.4* 33.7*  MCV 91.7 90.4 90.1 92.2 91.6  PLT 126* 146* 177 179 169    Cardiac Enzymes: No results for input(s): CKTOTAL, CKMB, CKMBINDEX, TROPONINI in the last 168 hours.  BNP: Invalid input(s): POCBNP  CBG: No results for input(s): GLUCAP in the last 168 hours.  Microbiology: Results for orders placed or performed during the hospital encounter of 10/16/18  Urine culture     Status: None   Collection Time: 10/16/18  2:26 AM   Specimen: Urine, Random  Result Value Ref Range Status   Specimen Description   Final    URINE, RANDOM Performed at [facility], [address], [place], [place] [address]    Special Requests   Final    NONE Performed at [facility], [address], [place], [place] [address]    Culture   Final    NO GROWTH Performed at [facility], [place] [address], [place], [place] [address]    Report Status  10/17/2018 FINAL  Final  Blood culture (routine x 2)     Status: None   Collection Time: 10/16/18  2:26 AM   Specimen: BLOOD  Result Value Ref Range Status   Specimen Description BLOOD LEFT FA  Final   Special Requests   Final    BOTTLES DRAWN AEROBIC AND ANAEROBIC Blood Culture adequate volume   Culture   Final    NO GROWTH 5 DAYS Performed at [facility], [address], [place], [place] [address]    Report Status 10/21/2018 FINAL  Final  Blood culture (routine x 2)     Status: Abnormal   Collection Time: 10/16/18  2:26 AM   Specimen: BLOOD  Result Value Ref Range Status   Specimen Description   Final    BLOOD LEFT FA Performed at [facility] [address] [place], [place] [address]    Special Requests   Final    BOTTLES DRAWN AEROBIC AND  ANAEROBIC Blood Culture adequate volume Performed at [facility], [address] [place], [place] [address]    Culture  Setup Time   Final    ANAEROBIC BOTTLE ONLY GRAM NEGATIVE RODS CRITICAL RESULT CALLED TO, READ BACK BY AND VERIFIED WITH: Dustin Folks AT [id number] ON 10/16/2018 BY JPM Performed at [facility], [place] [address], [place], [place] [address]    Culture KLEBSIELLA OXYTOCA (A)  Final   Report Status 10/18/2018 FINAL  Final   Organism ID, Bacteria KLEBSIELLA OXYTOCA  Final      Susceptibility   Klebsiella oxytoca - MIC*    AMPICILLIN >=32 RESISTANT Resistant     CEFAZOLIN <=4 SENSITIVE Sensitive     CEFEPIME <=1 SENSITIVE Sensitive     CEFTAZIDIME <=1 SENSITIVE Sensitive     CEFTRIAXONE <=1 SENSITIVE Sensitive     CIPROFLOXACIN <=0.25 SENSITIVE Sensitive     GENTAMICIN <=1 SENSITIVE Sensitive     IMIPENEM <=0.25 SENSITIVE Sensitive     TRIMETH/SULFA <=20 SENSITIVE Sensitive     AMPICILLIN/SULBACTAM 8 SENSITIVE Sensitive     PIP/TAZO <=4 SENSITIVE Sensitive     * KLEBSIELLA OXYTOCA  SARS Coronavirus 2 [facility] order, Performed in [facility] hospital lab) Nasopharyngeal Nasopharyngeal Swab     Status: None   Collection Time: 10/16/18  2:26 AM   Specimen: Nasopharyngeal Swab  Result Value Ref Range Status   SARS Coronavirus 2 NEGATIVE NEGATIVE Final    Comment: (NOTE) If result is NEGATIVE SARS-CoV-2 target nucleic acids are NOT DETECTED. The SARS-CoV-2 RNA is generally detectable in upper and lower  respiratory specimens during the acute phase of infection. The lowest  concentration of SARS-CoV-2 viral copies this assay can detect is 250  copies / mL. A negative result does not preclude SARS-CoV-2 infection  and should not be used as the sole basis for treatment or other  patient management decisions.  A negative result may occur with  improper specimen collection / handling, submission of specimen other  than nasopharyngeal swab, presence of viral  mutation(s) within the  areas targeted by this assay, and inadequate number of viral copies  (<250 copies / mL). A negative result must be combined with clinical  observations, patient history, and epidemiological information. If result is POSITIVE SARS-CoV-2 target nucleic acids are DETECTED. The SARS-CoV-2 RNA is generally detectable in upper and lower  respiratory specimens dur ing the acute phase of infection.  Positive  results are indicative of active infection with SARS-CoV-2.  Clinical  correlation with patient history  and other diagnostic information is  necessary to determine patient infection status.  Positive results do  not rule out bacterial infection or co-infection with other viruses. If result is PRESUMPTIVE POSTIVE SARS-CoV-2 nucleic acids MAY BE PRESENT.   A presumptive positive result was obtained on the submitted specimen  and confirmed on repeat testing.  While 2019 novel coronavirus  (SARS-CoV-2) nucleic acids may be present in the submitted sample  additional confirmatory testing may be necessary for epidemiological  and / or clinical management purposes  to differentiate between  SARS-CoV-2 and other Sarbecovirus currently known to infect humans.  If clinically indicated additional testing with an alternate test  methodology [phone]) is advised. The SARS-CoV-2 RNA is generally  detectable in upper and lower respiratory sp ecimens during the acute  phase of infection. The expected result is Negative. Fact Sheet for Patients:  StrictlyIdeas.no Fact Sheet for Healthcare Providers: BankingDealers.co.za This test is not yet approved or cleared by the [place] FDA and has been authorized for detection and/or diagnosis of SARS-CoV-2 by FDA under an Emergency Use Authorization (EUA).  This EUA will remain in effect (meaning this test can be used) for the duration of the COVID-19 declaration under Section 564(b)(1)  of the Act, 21 U.S.C. section 360bbb-3(b)(1), unless the authorization is terminated or revoked sooner. Performed at [facility], [address] [place], [place] [address]   Blood Culture ID Panel (Reflexed)     Status: Abnormal   Collection Time: 10/16/18  2:26 AM  Result Value Ref Range Status   Enterococcus species NOT DETECTED NOT DETECTED Final   Listeria monocytogenes NOT DETECTED NOT DETECTED Final   Staphylococcus species NOT DETECTED NOT DETECTED Final   Staphylococcus aureus (BCID) NOT DETECTED NOT DETECTED Final   Streptococcus species NOT DETECTED NOT DETECTED Final   Streptococcus agalactiae NOT DETECTED NOT DETECTED Final   Streptococcus pneumoniae NOT DETECTED NOT DETECTED Final   Streptococcus pyogenes NOT DETECTED NOT DETECTED Final   Acinetobacter baumannii NOT DETECTED NOT DETECTED Final   Enterobacteriaceae species DETECTED (A) NOT DETECTED Final    Comment: Enterobacteriaceae represent a large family of gram-negative bacteria, not a single organism. CRITICAL RESULT CALLED TO, READ BACK BY AND VERIFIED WITH: ELLINGTON,A AT [id number] ON 10/16/2018 BY JPM    Enterobacter cloacae complex NOT DETECTED NOT DETECTED Final   Escherichia coli NOT DETECTED NOT DETECTED Final   Klebsiella oxytoca DETECTED (A) NOT DETECTED Final    Comment: CRITICAL RESULT CALLED TO, READ BACK BY AND VERIFIED WITH: ELLINGTON,A AT 1747 ON 10/16/2018 BY JPM    Klebsiella pneumoniae NOT DETECTED NOT DETECTED Final   Proteus species NOT DETECTED NOT DETECTED Final   Serratia marcescens NOT DETECTED NOT DETECTED Final   Carbapenem resistance NOT DETECTED NOT DETECTED Final   Haemophilus influenzae NOT DETECTED NOT DETECTED Final   Neisseria meningitidis NOT DETECTED NOT DETECTED Final   Pseudomonas aeruginosa NOT DETECTED NOT DETECTED Final   Candida albicans NOT DETECTED NOT DETECTED Final   Candida glabrata NOT DETECTED NOT DETECTED Final   Candida krusei NOT DETECTED NOT DETECTED Final    Candida parapsilosis NOT DETECTED NOT DETECTED Final   Candida tropicalis NOT DETECTED NOT DETECTED Final    Comment: Performed at [facility], [address] [place],  [address]  CULTURE, BLOOD (ROUTINE X 2) w Reflex to ID Panel     Status: None   Collection Time: 10/21/18 12:38 AM   Specimen: BLOOD  Result Value Ref Range Status  Specimen Description BLOOD RIGHT ANTECUBITAL  Final   Special Requests   Final    BOTTLES DRAWN AEROBIC AND ANAEROBIC Blood Culture adequate volume   Culture   Final    NO GROWTH 5 DAYS Performed at [facility], [address] [place], [place] [address]    Report Status 10/26/2018 FINAL  Final  CULTURE, BLOOD (ROUTINE X 2) w Reflex to ID Panel     Status: None   Collection Time: 10/21/18 12:38 AM   Specimen: BLOOD  Result Value Ref Range Status   Specimen Description BLOOD LEFT ANTECUBITAL  Final   Special Requests   Final    BOTTLES DRAWN AEROBIC AND ANAEROBIC Blood Culture adequate volume   Culture   Final    NO GROWTH 5 DAYS Performed at [facility], [address], [place],  [address]    Report Status 10/26/2018 FINAL  Final    Coagulation Studies: Recent Labs    10/25/18 0411 10/26/18 0419 10/27/18 0555  LABPROT 23.0* 19.4* 21.8*  INR 2.1* 1.7* 1.9*    Urinalysis: No results for input(s): COLORURINE, LABSPEC, PHURINE, GLUCOSEU, HGBUR, BILIRUBINUR, KETONESUR, PROTEINUR, UROBILINOGEN, NITRITE, LEUKOCYTESUR in the last 72 hours.  Invalid input(s): APPERANCEUR    Imaging: No results found.   Medications:   . cefTRIAXone (ROCEPHIN)  IV 2 g (10/27/18 0820)  . heparin 1,000 Units/hr (10/27/18 0603)   . amLODipine  10 mg Oral Daily  . buPROPion  300 mg Oral Daily  . calcitRIOL  0.25 mcg Oral QHS  . citalopram  20 mg Oral Daily  . dicyclomine  10 mg Oral TID AC  . donepezil  10 mg Oral QHS  . labetalol  200 mg Oral BID  . magnesium oxide  400 mg Oral BID  . mometasone-formoterol   2 puff Inhalation BID  . mycophenolate  250 mg Oral q morning - 10a   And  . mycophenolate  500 mg Oral QHS  . pantoprazole  40 mg Oral Daily  . predniSONE  5 mg Oral Q breakfast  . tacrolimus  0.5 mg Oral BID  . warfarin  2.5 mg Oral ONCE-1800  . Warfarin - Pharmacist Dosing Inpatient   Does not apply q1800   acetaminophen **OR** acetaminophen, albuterol, bisacodyl, diphenoxylate-atropine, hydrALAZINE, HYDROcodone-acetaminophen, ondansetron **OR** ondansetron (ZOFRAN) IV  Assessment/ Plan:  Mr. SOURYA ISIP is a 68 y.o. 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 Acute renal failure with metabolic acidosis on Chronic kidney disease stage IVT Status post renal transplantation in 2001.  His baseline creatinine is 2.8, GFR of 23 on 03/27/18. Acute renal failure seems secondary to prerenal azotemia and current infection.  Followed by Dr. Alger Simons, [facility]. - tac level 5.2 on September 17 - Continue mycophenolate, tacrolimus and prednisone..   2. Urinary tract infection: Klebsiella oxytoca.   -Currently being treated with IV ceftriaxone.   3. Anemia of chronic kidney disease: Lab Results  Component Value Date   HGB 10.3 (L) 10/27/2018       LOS: 11 Eric Gill 9/19/202011:39 AM

## 2018-10-27 NOTE — Progress Notes (Signed)
[place] [facility] [place] NAME: Eric Gill    MR#:  [id number]  DATE OF BIRTH:  09-Jan-1951  SUBJECTIVE:  CHIEF COMPLAINT:   Chief Complaint  Patient presents with  . Urinary Frequency  . Weakness  [name] feeling better.  No new complaint this morning.  Patient already weaned off oxygen with oxygen saturation of 92% on room air this morning. No shortness of breath No chest pain  REVIEW OF SYSTEMS:  Review of Systems  Constitutional: Negative for chills, fever and malaise/fatigue.  HENT: Negative for sore throat.   Eyes: Negative for blurred vision and double vision.  Respiratory: Negative for cough, hemoptysis, shortness of breath, wheezing and stridor.   Cardiovascular: Negative for chest pain, palpitations, orthopnea and leg swelling.  Gastrointestinal: Negative for abdominal pain, blood in stool, diarrhea, melena, nausea and vomiting.  Genitourinary: Negative for dysuria, flank pain and hematuria.  Musculoskeletal: Negative for back pain and joint pain.  Skin: Negative for rash.  Neurological: Negative for dizziness, sensory change, focal weakness, seizures, loss of consciousness, weakness and headaches.  Endo/Heme/Allergies: Negative for polydipsia.  Psychiatric/Behavioral: Negative for depression. The patient is not nervous/anxious.     DRUG ALLERGIES:   Allergies  Allergen Reactions  . Losartan Cough  . Statins Nausea And Vomiting and Other (See Comments)    Reaction:  Muscle pain    VITALS:  Blood pressure (!) 175/73, pulse 73, temperature 98.5 F (36.9 C), temperature source Oral, resp. rate 19, height 5\' 3"  (1.6 m), weight 58.1 kg, SpO2 93 %. PHYSICAL EXAMINATION:  Physical Exam Constitutional:      General: He is not in acute distress. HENT:     Head: Normocephalic.     Mouth/Throat:     Mouth: Mucous membranes are moist.  Eyes:     General: No scleral icterus.    Conjunctiva/sclera: Conjunctivae normal.    Pupils: Pupils are equal, round, and reactive to light.  Neck:     Musculoskeletal: Normal range of motion and neck supple.     Vascular: No JVD.     Trachea: No tracheal deviation.  Cardiovascular:     Rate and Rhythm: Normal rate and regular rhythm.     Heart sounds: Normal heart sounds. No murmur. No gallop.   Pulmonary:     Effort: Pulmonary effort is normal. No respiratory distress.     Breath sounds: No rales.  Abdominal:     General: Bowel sounds are normal. There is no distension.     Palpations: Abdomen is soft.     Tenderness: There is no abdominal tenderness. There is no rebound.  Musculoskeletal: Normal range of motion.        General: No tenderness.     Right lower leg: No edema.     Left lower leg: No edema.  Skin:    Findings: No erythema or rash.  Neurological:     General: No focal deficit present.     Mental Status: He is alert.     Cranial Nerves: No cranial nerve deficit.  Psychiatric:        Mood and Affect: Mood normal.    LABORATORY PANEL:  Male CBC Recent Labs  Lab 10/27/18 0555  WBC 7.4  HGB 10.3*  HCT 33.7*  PLT 169   ------------------------------------------------------------------------------------------------------------------ Chemistries  Recent Labs  Lab 10/27/18 0555  NA 143  K 4.2  CL 110  CO2 27  GLUCOSE 91  BUN 47*  CREATININE 2.90*  CALCIUM 9.0  MG 2.0   RADIOLOGY:  No results found. ASSESSMENT AND PLAN:   -ARF on CKD stage IV, h/o kidney transplant. Nephrologist following.  Recently diuresed with IV Lasix which is previously been discontinued due to increase in creatinine.  Renal function continues to improve.  Continue mycophenolate, tacrolimus and prednisone per Dr. Candiss Norse   -HTN emergency continue current regimen blood pressures under control Blood pressure fairly controlled given patient's age.  -UTI and bacteremia.  U/C: No growth.  Blood culture: Klebsiella oxytoca. Cefepime is discontinued, changed to  Rocephin .to continue IV antibiotics until 10/30/2018  Dr. Delaine Lame  -Acute metabolic encephalopathy Multifactorial due to ongoing infectious process and hypoxia Improved Seen by neurology  -H/O DVT, PVD, CVA, coumadin PTD.  INR subtherapeutic at 1.7 this morning. [name] wife concerned that the last time patient's INR was subtherapeutic he had a stroke. I discussed with pharmacy.  Initiated heparin drip to bridge until INR therapeutic at at least 2.0 before discontinuation of heparin drip. Pharmacist assisting with dosing Coumadin.  -Anemia of chronic disease.  Stable.  -Hypokalemia.  Replaced   -Hypophosphatemia.  Phosphate supplement.  -Acute hypoxic respiratory failure due to acute diastolic CHF Secondary to pulmonary edema.  Patient slowly improving.  Diuretics previously discontinued due to worsening of renal function.  -DVT prophylaxis ; patient on Coumadin   all the records are reviewed and case discussed with Care Management/Social Worker. Management plans discussed with the patient, and he is in in agreement. Called and updated patient's wife over the phone on treatment plans.  All questions were answered today.  CODE STATUS: Full Code  TOTAL TIME TAKING CARE OF THIS PATIENT: 77minutes.   More than 50% of the time was spent in counseling/coordination of care: YES  POSSIBLE D/C IN 4 DAYS, DEPENDING ON CLINICAL CONDITION.   Saundra Shelling M.D on 10/27/2018 at 12:42 PM  Between 7am to 6pm - Pager - [phone]  After 6pm go to [email or web address] - [facility]

## 2018-10-28 LAB — CBC
HCT: 34.2 % — ABNORMAL LOW (ref 39.0–52.0)
Hemoglobin: 10.5 g/dL — ABNORMAL LOW (ref 13.0–17.0)
MCH: 28.3 pg (ref 26.0–34.0)
MCHC: 30.7 g/dL (ref 30.0–36.0)
MCV: 92.2 fL (ref 80.0–100.0)
Platelets: 166 10*3/uL (ref 150–400)
RBC: 3.71 MIL/uL — ABNORMAL LOW (ref 4.22–5.81)
RDW: 13.1 % (ref 11.5–15.5)
WBC: 9.2 10*3/uL (ref 4.0–10.5)
nRBC: 0 % (ref 0.0–0.2)

## 2018-10-28 LAB — BASIC METABOLIC PANEL
Anion gap: 9 (ref 5–15)
BUN: 40 mg/dL — ABNORMAL HIGH (ref 8–23)
CO2: 25 mmol/L (ref 22–32)
Calcium: 9 mg/dL (ref 8.9–10.3)
Chloride: 106 mmol/L (ref 98–111)
Creatinine, Ser: 2.71 mg/dL — ABNORMAL HIGH (ref 0.61–1.24)
GFR calc Af Amer: 26 mL/min — ABNORMAL LOW (ref >60)
GFR calc non Af Amer: 23 mL/min — ABNORMAL LOW (ref >60)
Glucose, Bld: 111 mg/dL — ABNORMAL HIGH (ref 70–99)
Potassium: 4.3 mmol/L (ref 3.5–5.1)
Sodium: 140 mmol/L (ref 135–145)

## 2018-10-28 LAB — HEPARIN LEVEL (UNFRACTIONATED)
Heparin Unfractionated: 0.71 IU/mL — ABNORMAL HIGH (ref 0.30–0.70)
Heparin Unfractionated: 0.75 IU/mL — ABNORMAL HIGH (ref 0.30–0.70)
Heparin Unfractionated: 0.6 IU/mL (ref 0.30–0.70)

## 2018-10-28 LAB — MAGNESIUM: Magnesium: 2.1 mg/dL (ref 1.7–2.4)

## 2018-10-28 LAB — PROTIME-INR
INR: 1.7 — ABNORMAL HIGH (ref 0.8–1.2)
Prothrombin Time: 19.6 seconds — ABNORMAL HIGH (ref 11.4–15.2)

## 2018-10-28 MED ORDER — WARFARIN SODIUM 3 MG PO TABS
3.0000 mg | ORAL_TABLET | Freq: Once | ORAL | Status: AC
  Administered 2018-10-28: 3 mg via ORAL
  Filled 2018-10-28: qty 1

## 2018-10-28 NOTE — Consult Note (Signed)
  [place] for Warfarin Dosing and Monitoring Indication: VTE Treatment   Patient Measurements: Height: 5\' 3"  (160 cm) Weight: 128 lb (58.1 kg) IBW/kg (Calculated) : 56.9  Vital Signs: Temp: 98.7 F (37.1 C) (09/20 0017) Temp Source: Oral (09/20 0017) BP: 153/82 (09/20 0017) Pulse Rate: 68 (09/20 0017)  Labs: Recent Labs    10/26/18 0419 10/26/18 2213 10/27/18 0555 10/28/18 0412  HGB 10.5*  --  10.3* 10.5*  HCT 34.4*  --  33.7* 34.2*  PLT 179  --  169 166  LABPROT 19.4*  --  21.8* 19.6*  INR 1.7*  --  1.9* 1.7*  HEPARINUNFRC  --  0.37 0.56 0.71*  CREATININE 3.25*  --  2.90* 2.71*    Estimated Creatinine Clearance: 20.4 mL/min (A) (by C-G formula based on SCr of 2.71 mg/dL (H)).   Medical History: Past Medical History:  Diagnosis Date  . Anemia   . Antiphospholipid antibody syndrome ([place])   . Arthritis   . DVT (deep venous thrombosis) ([place])   . Essential hypertension   . Gastric ulcer   . GERD (gastroesophageal reflux disease)   . History of CMV   . Hyperlipidemia   . Hyperparathyroidism ([place])   . Pedal edema   . Peripheral vascular disease ([place])   . Rosacea   . Status post kidney transplant 2001  . Stroke ([place])   . TIA (transient ischemic attack)   . Vascular dementia without behavioral disturbance [facility])    Assessment: Pharmacy consulted for warfarin dosing and monitoring in 68  yo male with PMH of DVT. Patient has been admitted with UTI and Acute Renal Failure on CKD w/ PMH of kidney transplant. INR therapeutic on admission. Per wife, prefer to keep pt between INR 2.5 and 3.0 d/t APS and PMH of strokes. H&H stable, PLT trending up.  Home Regimen: Warfarin 3mg : Mon, Wed, Fri                              Warfarin 2mg : [place], Sat, Sun   INR Warfarin dose given 9/8 2.4 2mg  9/9 2.5 3mg  9/10 2.5 2mg  9/11 2.6 3mg   9/12 3.1 2mg  9/13 3.6 HELD 9/14 4.8 HELD 9/15 4.0 HELD 9/16 2.8       1 mg 9/17     2.1       2  mg 9/18     1.7       2 mg 9/19     1.9       2.5 mg 9/20     1.7        3 mg  Vitamin K Antagonists (eg, warfarin): Cephalosporins may enhance the anticoagulant effect of Vitamin K Antagonists. Risk C: Monitor therapy - likely unexpected bump in INR due to this interaction Goal of Therapy:  INR 2.5-3.0 Monitor platelets by anticoagulation protocol: Yes   Plan:   INR 1.7  Per MD, heparin will be added to bridge until INR is therapeutic >2.  Will give Warfarin 3mg  and obtain INR with am labs  INRs daily per protocol while receiving antibiotic  CBC at least every 3 days per protocol.  Pharmacy will continue to follow and order dose based on levels.   Olivia Canter, [facility] 10/28/2018 9:29 AM

## 2018-10-28 NOTE — Consult Note (Signed)
ANTICOAGULATION CONSULT NOTE   Pharmacy Consult for Heparin Indication: VTE treatment  Patient Measurements: Height: 5\' 3"  (160 cm) Weight: 128 lb (58.1 kg) IBW/kg (Calculated) : 56.9 Heparin Dosing Weight: TBW  Vital Signs: BP: 145/68 (09/20 1604) Pulse Rate: 70 (09/20 1604)  Labs: Recent Labs    10/26/18 0419  10/27/18 0555 10/28/18 0412 10/28/18 1417 10/28/18 2301  HGB 10.5*  --  10.3* 10.5*  --   --   HCT 34.4*  --  33.7* 34.2*  --   --   PLT 179  --  169 166  --   --   LABPROT 19.4*  --  21.8* 19.6*  --   --   INR 1.7*  --  1.9* 1.7*  --   --   HEPARINUNFRC  --    < > 0.56 0.71* 0.75* 0.60  CREATININE 3.25*  --  2.90* 2.71*  --   --    < > = values in this interval not displayed.    Estimated Creatinine Clearance: 20.4 mL/min (A) (by C-G formula based on SCr of 2.71 mg/dL (H)).  Medications:  Coumadin  Pharmacy consulted for heparin dosing and monitoring in 68  yo male with PMH of DVT. Patient has been admitted with UTI/bacteremia and Acute Renal Failure on CKD w/ PMH of kidney transplant.  [name] also on warfarin outpatient.  Per wife, prefer to keep pt between INR 2.5 and 3.0 d/t APS and PMH of strokes.   Assessment: INR is currently subtherapeutic at 1.7, and pharmacy has been consulted to bridge heparin (no bolus) until INR therapeutic > 2.    9/18 @ 2213 HL = 0.37, therapeutic x 1 9/19 @ 0555 HL = 0.56, therapeutic x 2 (INR = 1.9), CBC appears stable. 9/20 @ 0412 HL = 0.71, slightly supratherapeutic (INR = 1.7), CBC appears stable 9/20 @ 1417 HL = 0.75.   9/20 @ 2301 HL = 0.60, therapeutic x 1  Goal of Therapy:   Heparin level goal 0.3-0.7   Plan:   Continue heparin continuous infusion at 850 units/hour, recheck HL in am to confirm   Heparin level and INR ordered for 9/21 am w/ routine labs  Will order daily heparin level and CBC per protocol, and monitor platelets by anticoagulation protocol.  Ena Dawley, [facility] 10/28/2018 11:33 PM

## 2018-10-28 NOTE — Progress Notes (Signed)
[facility] NAME: Eric Gill    MR#:  [id number]  DATE OF BIRTH:  11-30-1950  SUBJECTIVE:  CHIEF COMPLAINT:   Chief Complaint  Patient presents with  . Urinary Frequency  . Weakness  Patient seen and evaluated today Comfortable on room air No shortness of breath No wheezing Sitting in the chair REVIEW OF SYSTEMS:  Review of Systems  Constitutional: Negative for chills, fever and malaise/fatigue.  HENT: Negative for sore throat.   Eyes: Negative for blurred vision and double vision.  Respiratory: Negative for cough, hemoptysis, shortness of breath, wheezing and stridor.   Cardiovascular: Negative for chest pain, palpitations, orthopnea and leg swelling.  Gastrointestinal: Negative for abdominal pain, blood in stool, diarrhea, melena, nausea and vomiting.  Genitourinary: Negative for dysuria, flank pain and hematuria.  Musculoskeletal: Negative for back pain and joint pain.  Skin: Negative for rash.  Neurological: Negative for dizziness, sensory change, focal weakness, seizures, loss of consciousness, weakness and headaches.  Endo/Heme/Allergies: Negative for polydipsia.  Psychiatric/Behavioral: Negative for depression. The patient is not nervous/anxious.     DRUG ALLERGIES:   Allergies  Allergen Reactions  . Losartan Cough  . Statins Nausea And Vomiting and Other (See Comments)    Reaction:  Muscle pain    VITALS:  Blood pressure (!) 148/84, pulse 84, temperature 98.7 F (37.1 C), temperature source Oral, resp. rate 16, height 5\' 3"  (1.6 m), weight 58.1 kg, SpO2 95 %. PHYSICAL EXAMINATION:  Physical Exam Constitutional:      General: He is not in acute distress. HENT:     Head: Normocephalic.     Mouth/Throat:     Mouth: Mucous membranes are moist.  Eyes:     General: No scleral icterus.    Conjunctiva/sclera: Conjunctivae normal.     Pupils: Pupils are equal, round, and reactive to light.  Neck:   Musculoskeletal: Normal range of motion and neck supple.     Vascular: No JVD.     Trachea: No tracheal deviation.  Cardiovascular:     Rate and Rhythm: Normal rate and regular rhythm.     Heart sounds: Normal heart sounds. No murmur. No gallop.   Pulmonary:     Effort: Pulmonary effort is normal. No respiratory distress.     Breath sounds: No rales.  Abdominal:     General: Bowel sounds are normal. There is no distension.     Palpations: Abdomen is soft.     Tenderness: There is no abdominal tenderness. There is no rebound.  Musculoskeletal: Normal range of motion.        General: No tenderness.     Right lower leg: No edema.     Left lower leg: No edema.  Skin:    Findings: No erythema or rash.  Neurological:     General: No focal deficit present.     Mental Status: He is alert.     Cranial Nerves: No cranial nerve deficit.  Psychiatric:        Mood and Affect: Mood normal.    LABORATORY PANEL:  Male CBC Recent Labs  Lab 10/28/18 0412  WBC 9.2  HGB 10.5*  HCT 34.2*  PLT 166   ------------------------------------------------------------------------------------------------------------------ Chemistries  Recent Labs  Lab 10/28/18 0412  NA 140  K 4.3  CL 106  CO2 25  GLUCOSE 111*  BUN 40*  CREATININE 2.71*  CALCIUM 9.0  MG 2.1   RADIOLOGY:  No results found. 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 Nephrologist following keenly.  Recently diuresed with IV Lasix which is previously been discontinued due to increase in creatinine.  Renal function continues to improve every day.  Continue mycophenolate, tacrolimus and prednisone per Dr. Candiss Norse   -HTN emergency resolved continue current regimen blood pressures under control Blood pressure fairly controlled given patient's age.  -UTI and bacteremia.  U/C: No growth.  Blood culture: Klebsiella oxytoca. Cefepime is discontinued, changed to Rocephin .to continue IV antibiotics until  10/30/2018 per Dr. Delaine Lame infectious disease specialist  -Acute metabolic encephalopathy Multifactorial due to ongoing infectious process and hypoxia Improved Seen by neurology  -H/O DVT, PVD, CVA, coumadin PTD.  INR subtherapeutic at 1.7 this morning. [name] wife concerned that the last time patient's INR was subtherapeutic he had a stroke. Continue anticoagulation with heparin drip and Coumadin Follow-up INR Pharmacy to dose Coumadin  -Anemia of chronic disease.  Stable.  -Hypokalemia.  Replaced   -Hypophosphatemia.  Phosphate supplement.  -Acute hypoxic respiratory failure due to acute diastolic CHF resolved Secondary to pulmonary edema.  Patient improved.  Diuretics previously discontinued due to worsening of renal function.  -DVT prophylaxis ; patient on Coumadin   all the records are reviewed and case discussed with Care Management/Social Worker. Management plans discussed with the patient, and he is in in agreement. Called and updated patient's wife over the phone on treatment plans.  All questions were answered today.  CODE STATUS: Full Code  TOTAL TIME TAKING CARE OF THIS PATIENT: 32 minutes.   More than 50% of the time was spent in counseling/coordination of care: YES  POSSIBLE D/C IN 4 DAYS, DEPENDING ON CLINICAL CONDITION.   Saundra Shelling M.D on 10/28/2018 at 10:45 AM  Between 7am to 6pm - Pager - [phone]  After 6pm go to [email or web address] - [facility]

## 2018-10-28 NOTE — Consult Note (Signed)
ANTICOAGULATION CONSULT NOTE   Pharmacy Consult for Heparin Indication: VTE treatment  Patient Measurements: Height: 5\' 3"  (160 cm) Weight: 128 lb (58.1 kg) IBW/kg (Calculated) : 56.9 Heparin Dosing Weight: TBW  Vital Signs: BP: 148/84 (09/20 0945) Pulse Rate: 84 (09/20 0945)  Labs: Recent Labs    10/26/18 0419  10/27/18 0555 10/28/18 0412 10/28/18 1417  HGB 10.5*  --  10.3* 10.5*  --   HCT 34.4*  --  33.7* 34.2*  --   PLT 179  --  169 166  --   LABPROT 19.4*  --  21.8* 19.6*  --   INR 1.7*  --  1.9* 1.7*  --   HEPARINUNFRC  --    < > 0.56 0.71* 0.75*  CREATININE 3.25*  --  2.90* 2.71*  --    < > = values in this interval not displayed.    Estimated Creatinine Clearance: 20.4 mL/min (A) (by C-G formula based on SCr of 2.71 mg/dL (H)).  Medications:  Coumadin  Pharmacy consulted for heparin dosing and monitoring in 68  yo male with PMH of DVT. Patient has been admitted with UTI/bacteremia and Acute Renal Failure on CKD w/ PMH of kidney transplant.  [name] also on warfarin outpatient.  Per wife, prefer to keep pt between INR 2.5 and 3.0 d/t APS and PMH of strokes.   Assessment: INR is currently subtherapeutic at 1.7, and pharmacy has been consulted to bridge heparin (no bolus) until INR therapeutic > 2.    9/18 @ 2213 HL = 0.37, therapeutic x 1 9/19 @ 0555 HL = 0.56, therapeutic x 2 (INR = 1.9), CBC appears stable. 9/20 @ 0412 HL = 0.71, slightly supratherapeutic (INR = 1.7), CBC appears stable 9/20 @ 1417 HL = 0.75.    Goal of Therapy:   Heparin level goal 0.3-0.7   Plan:   Reduce heparin continuous infusion to 850 units/hour, recheck HL 8 hrs after rate decrease tonight at 23:00.  Heparin level and INR ordered for 9/21 am w/ routine labs  Will order daily heparin level and CBC per protocol, and monitor platelets by anticoagulation protocol.  Olivia Canter, [facility] 10/28/2018 3:15 PM

## 2018-10-28 NOTE — Consult Note (Signed)
ANTICOAGULATION CONSULT NOTE   Pharmacy Consult for Heparin Indication: VTE treatment  Patient Measurements: Height: 5\' 3"  (160 cm) Weight: 128 lb (58.1 kg) IBW/kg (Calculated) : 56.9 Heparin Dosing Weight: TBW  Vital Signs: Temp: 98.7 F (37.1 C) (09/20 0017) Temp Source: Oral (09/20 0017) BP: 153/82 (09/20 0017) Pulse Rate: 68 (09/20 0017)  Labs: Recent Labs    10/26/18 0419 10/26/18 2213 10/27/18 0555 10/28/18 0412  HGB 10.5*  --  10.3* 10.5*  HCT 34.4*  --  33.7* 34.2*  PLT 179  --  169 166  LABPROT 19.4*  --  21.8* 19.6*  INR 1.7*  --  1.9* 1.7*  HEPARINUNFRC  --  0.37 0.56 0.71*  CREATININE 3.25*  --  2.90* 2.71*    Estimated Creatinine Clearance: 20.4 mL/min (A) (by C-G formula based on SCr of 2.71 mg/dL (H)).  Medications:  Coumadin  Pharmacy consulted for heparin dosing and monitoring in 68  yo male with PMH of DVT. Patient has been admitted with UTI/bacteremia and Acute Renal Failure on CKD w/ PMH of kidney transplant.  [name] also on warfarin outpatient.  Per wife, prefer to keep pt between INR 2.5 and 3.0 d/t APS and PMH of strokes.   Assessment: INR is currently subtherapeutic at 1.7, and pharmacy has been consulted to bridge heparin (no bolus) until INR therapeutic > 2.    9/18 @ 2213 HL = 0.37, therapeutic x 1 9/19 @ 0555 HL = 0.56, therapeutic x 2 (INR = 1.9), CBC appears stable. 9/20 @ 0412 HL = 0.71, slightly supratherapeutic (INR = 1.7), CBC appears stable  Goal of Therapy:   Heparin level goal 0.3-0.7   Plan:   Reduce heparin continuous infusion to 900 units/hour, recheck HL 8 hrs after rate decrease  Heparin level and INR ordered for 9/21 am w/ routine labs  Will order daily heparin level and CBC per protocol, and monitor platelets by anticoagulation protocol.  Ena Dawley, PharmD 10/28/2018 5:28 AM

## 2018-10-28 NOTE — Consult Note (Signed)
PHARMACY CONSULT NOTE - FOLLOW UP  Pharmacy Consult for Electrolyte Monitoring and Replacement   Recent Labs: Potassium (mmol/L)  Date Value  10/28/2018 4.3   Magnesium (mg/dL)  Date Value  10/28/2018 2.1   Calcium (mg/dL)  Date Value  10/28/2018 9.0   Albumin (g/dL)  Date Value  10/22/2018 3.4 (L)   Phosphorus (mg/dL)  Date Value  10/23/2018 3.0   Sodium (mmol/L)  Date Value  10/28/2018 140    Assessment -K 4.3  Goal WNL. Currently therapeutic -Mg 2.1.  Goal WNL.  Currently therapeutic.     Patient home regimen is Furosemide 40mg  qd with a total of 27meq KCl daily replenishment, as well as magnesium oxide 400 mg BID.  Plan No replenishment needed at this time.  Pharmacy will defer to primary treatment team for labs, but will continue to follow consult.  Will replace to maintain electrolytes within normal limits.   Olivia Canter, [facility]   10/28/2018 9:29 AM

## 2018-10-28 NOTE — Progress Notes (Signed)
[facility]  ROUNDING NOTE   Subjective:   Lab Results  Component Value Date   CREATININE 2.71 (H) 10/28/2018   CREATININE 2.90 (H) 10/27/2018   CREATININE 3.25 (H) 10/26/2018   [name] doing fair, lying in the bed Oral intake is also fair.  Serum creatinine has improved to 2.7 Denies any acute shortness of breath Continued on IV antibiotics  Objective:  Vital signs in last 24 hours:  Temp:  [98.6 F (37 C)-98.7 F (37.1 C)] 98.7 F (37.1 C) (09/20 0017) Pulse Rate:  [68-84] 84 (09/20 0945) Resp:  [15-16] 16 (09/20 0017) BP: (148-155)/(72-84) 148/84 (09/20 0945) SpO2:  [95 %] 95 % (09/20 0017)  Weight change:  Filed Weights   10/15/18 2309  Weight: 58.1 kg    Intake/Output: I/O last 3 completed shifts: In: 1272.9 [P.O.:840; I.V.:332.9; IV Piggyback:100] Out: 1080 [Urine:1080]   Intake/Output this shift:  Total I/O In: -  Out: 200 [Urine:200]  Physical Exam: General: NAD,    Head: Normocephalic, atraumatic. Moist oral mucosal membranes  Eyes: Anicteric,    Neck: Supple,   Lungs:   Mild rhonchi, room air, normal respiratory effort  Heart: Regular rate and rhythm  Abdomen:  Soft, nontender,   Extremities: no peripheral edema.  Neurologic: Nonfocal, moving all four extremities  Skin:  Scattered ecchymosis    Basic Metabolic Panel: Recent Labs  Lab 10/22/18 0329 10/23/18 0417 10/24/18 0427 10/25/18 0411 10/26/18 0419 10/27/18 0555 10/28/18 0412  NA 140 143 143 144 142 143 140  K 4.0 3.4* 3.2* 3.9 4.0 4.2 4.3  CL 103 103 102 104 106 110 106  CO2 25 28 29 28 28 27 25   GLUCOSE 134* 159* 121* 102* 84 91 111*  BUN 47* 56* 60* 63* 55* 47* 40*  CREATININE 3.88* 3.69* 3.85* 3.69* 3.25* 2.90* 2.71*  CALCIUM 9.2 9.1 9.1 9.3 9.2 9.0 9.0  MG  --  2.0 1.8 1.9  --  2.0 2.1  PHOS 3.7 3.0  --   --   --   --   --     Liver Function Tests: Recent Labs  Lab 10/22/18 0329  ALBUMIN 3.4*   No results for input(s): LIPASE, AMYLASE in the last  168 hours. No results for input(s): AMMONIA in the last 168 hours.  CBC: Recent Labs  Lab 10/22/18 0329 10/23/18 0417 10/26/18 0419 10/27/18 0555 10/28/18 0412  WBC 5.0 7.9 6.3 7.4 9.2  HGB 10.3* 10.0* 10.5* 10.3* 10.5*  HCT 32.8* 31.7* 34.4* 33.7* 34.2*  MCV 90.4 90.1 92.2 91.6 92.2  PLT 146* 177 179 169 166    Cardiac Enzymes: No results for input(s): CKTOTAL, CKMB, CKMBINDEX, TROPONINI in the last 168 hours.  BNP: Invalid input(s): POCBNP  CBG: No results for input(s): GLUCAP in the last 168 hours.  Microbiology: Results for orders placed or performed during the hospital encounter of 10/16/18  Urine culture     Status: None   Collection Time: 10/16/18  2:26 AM   Specimen: Urine, Random  Result Value Ref Range Status   Specimen Description   Final    URINE, RANDOM Performed at [facility], [address], [place], [place] [address]    Special Requests   Final    NONE Performed at [facility], [address] [place], [place] [address]    Culture   Final    NO GROWTH Performed at [facility], [place] [address] [place], [place] [address]    Report  Status 10/17/2018 FINAL  Final  Blood culture (routine x 2)     Status: None   Collection Time: 10/16/18  2:26 AM   Specimen: BLOOD  Result Value Ref Range Status   Specimen Description BLOOD LEFT FA  Final   Special Requests   Final    BOTTLES DRAWN AEROBIC AND ANAEROBIC Blood Culture adequate volume   Culture   Final    NO GROWTH 5 DAYS Performed at [facility], [address] [place], [place] [address]    Report Status 10/21/2018 FINAL  Final  Blood culture (routine x 2)     Status: Abnormal   Collection Time: 10/16/18  2:26 AM   Specimen: BLOOD  Result Value Ref Range Status   Specimen Description   Final    BLOOD LEFT FA Performed at [facility], [address] [place], [place] [address]    Special Requests   Final    BOTTLES DRAWN AEROBIC AND  ANAEROBIC Blood Culture adequate volume Performed at [facility], [address] [place], [place] [address]    Culture  Setup Time   Final    ANAEROBIC BOTTLE ONLY GRAM NEGATIVE RODS CRITICAL RESULT CALLED TO, READ BACK BY AND VERIFIED WITH: Dustin Folks AT [id number] ON 10/16/2018 BY JPM Performed at [facility], [place] [address], [place], [place] [address]    Culture KLEBSIELLA OXYTOCA (A)  Final   Report Status 10/18/2018 FINAL  Final   Organism ID, Bacteria KLEBSIELLA OXYTOCA  Final      Susceptibility   Klebsiella oxytoca - MIC*    AMPICILLIN >=32 RESISTANT Resistant     CEFAZOLIN <=4 SENSITIVE Sensitive     CEFEPIME <=1 SENSITIVE Sensitive     CEFTAZIDIME <=1 SENSITIVE Sensitive     CEFTRIAXONE <=1 SENSITIVE Sensitive     CIPROFLOXACIN <=0.25 SENSITIVE Sensitive     GENTAMICIN <=1 SENSITIVE Sensitive     IMIPENEM <=0.25 SENSITIVE Sensitive     TRIMETH/SULFA <=20 SENSITIVE Sensitive     AMPICILLIN/SULBACTAM 8 SENSITIVE Sensitive     PIP/TAZO <=4 SENSITIVE Sensitive     * KLEBSIELLA OXYTOCA  SARS Coronavirus 2 [facility] order, Performed in [facility] hospital lab) Nasopharyngeal Nasopharyngeal Swab     Status: None   Collection Time: 10/16/18  2:26 AM   Specimen: Nasopharyngeal Swab  Result Value Ref Range Status   SARS Coronavirus 2 NEGATIVE NEGATIVE Final    Comment: (NOTE) If result is NEGATIVE SARS-CoV-2 target nucleic acids are NOT DETECTED. The SARS-CoV-2 RNA is generally detectable in upper and lower  respiratory specimens during the acute phase of infection. The lowest  concentration of SARS-CoV-2 viral copies this assay can detect is 250  copies / mL. A negative result does not preclude SARS-CoV-2 infection  and should not be used as the sole basis for treatment or other  patient management decisions.  A negative result may occur with  improper specimen collection / handling, submission of specimen other  than nasopharyngeal swab, presence of viral  mutation(s) within the  areas targeted by this assay, and inadequate number of viral copies  (<250 copies / mL). A negative result must be combined with clinical  observations, patient history, and epidemiological information. If result is POSITIVE SARS-CoV-2 target nucleic acids are DETECTED. The SARS-CoV-2 RNA is generally detectable in upper and lower  respiratory specimens dur ing the acute phase of infection.  Positive  results are indicative of active infection with SARS-CoV-2.  Clinical  correlation with patient  history and other diagnostic information is  necessary to determine patient infection status.  Positive results do  not rule out bacterial infection or co-infection with other viruses. If result is PRESUMPTIVE POSTIVE SARS-CoV-2 nucleic acids MAY BE PRESENT.   A presumptive positive result was obtained on the submitted specimen  and confirmed on repeat testing.  While 2019 novel coronavirus  (SARS-CoV-2) nucleic acids may be present in the submitted sample  additional confirmatory testing may be necessary for epidemiological  and / or clinical management purposes  to differentiate between  SARS-CoV-2 and other Sarbecovirus currently known to infect humans.  If clinically indicated additional testing with an alternate test  methodology [phone]) is advised. The SARS-CoV-2 RNA is generally  detectable in upper and lower respiratory sp ecimens during the acute  phase of infection. The expected result is Negative. Fact Sheet for Patients:  StrictlyIdeas.no Fact Sheet for Healthcare Providers: BankingDealers.co.za This test is not yet approved or cleared by the [place] FDA and has been authorized for detection and/or diagnosis of SARS-CoV-2 by FDA under an Emergency Use Authorization (EUA).  This EUA will remain in effect (meaning this test can be used) for the duration of the COVID-19 declaration under Section 564(b)(1)  of the Act, 21 U.S.C. section 360bbb-3(b)(1), unless the authorization is terminated or revoked sooner. Performed at [facility], [address] [place], [place] [address]   Blood Culture ID Panel (Reflexed)     Status: Abnormal   Collection Time: 10/16/18  2:26 AM  Result Value Ref Range Status   Enterococcus species NOT DETECTED NOT DETECTED Final   Listeria monocytogenes NOT DETECTED NOT DETECTED Final   Staphylococcus species NOT DETECTED NOT DETECTED Final   Staphylococcus aureus (BCID) NOT DETECTED NOT DETECTED Final   Streptococcus species NOT DETECTED NOT DETECTED Final   Streptococcus agalactiae NOT DETECTED NOT DETECTED Final   Streptococcus pneumoniae NOT DETECTED NOT DETECTED Final   Streptococcus pyogenes NOT DETECTED NOT DETECTED Final   Acinetobacter baumannii NOT DETECTED NOT DETECTED Final   Enterobacteriaceae species DETECTED (A) NOT DETECTED Final    Comment: Enterobacteriaceae represent a large family of gram-negative bacteria, not a single organism. CRITICAL RESULT CALLED TO, READ BACK BY AND VERIFIED WITH: ELLINGTON,A AT [id number] ON 10/16/2018 BY JPM    Enterobacter cloacae complex NOT DETECTED NOT DETECTED Final   Escherichia coli NOT DETECTED NOT DETECTED Final   Klebsiella oxytoca DETECTED (A) NOT DETECTED Final    Comment: CRITICAL RESULT CALLED TO, READ BACK BY AND VERIFIED WITH: ELLINGTON,A AT 1747 ON 10/16/2018 BY JPM    Klebsiella pneumoniae NOT DETECTED NOT DETECTED Final   Proteus species NOT DETECTED NOT DETECTED Final   Serratia marcescens NOT DETECTED NOT DETECTED Final   Carbapenem resistance NOT DETECTED NOT DETECTED Final   Haemophilus influenzae NOT DETECTED NOT DETECTED Final   Neisseria meningitidis NOT DETECTED NOT DETECTED Final   Pseudomonas aeruginosa NOT DETECTED NOT DETECTED Final   Candida albicans NOT DETECTED NOT DETECTED Final   Candida glabrata NOT DETECTED NOT DETECTED Final   Candida krusei NOT DETECTED NOT DETECTED Final    Candida parapsilosis NOT DETECTED NOT DETECTED Final   Candida tropicalis NOT DETECTED NOT DETECTED Final    Comment: Performed at [facility], [address] [place], [place] [address]  CULTURE, BLOOD (ROUTINE X 2) w Reflex to ID Panel     Status: None   Collection Time: 10/21/18 12:38 AM   Specimen: BLOOD  Result Value Ref Range Status  Specimen Description BLOOD RIGHT ANTECUBITAL  Final   Special Requests   Final    BOTTLES DRAWN AEROBIC AND ANAEROBIC Blood Culture adequate volume   Culture   Final    NO GROWTH 5 DAYS Performed at [facility], [address] [place], [place] [address]    Report Status 10/26/2018 FINAL  Final  CULTURE, BLOOD (ROUTINE X 2) w Reflex to ID Panel     Status: None   Collection Time: 10/21/18 12:38 AM   Specimen: BLOOD  Result Value Ref Range Status   Specimen Description BLOOD LEFT ANTECUBITAL  Final   Special Requests   Final    BOTTLES DRAWN AEROBIC AND ANAEROBIC Blood Culture adequate volume   Culture   Final    NO GROWTH 5 DAYS Performed at [facility], [address] [place], [place] [address]    Report Status 10/26/2018 FINAL  Final    Coagulation Studies: Recent Labs    10/26/18 0419 10/27/18 0555 10/28/18 0412  LABPROT 19.4* 21.8* 19.6*  INR 1.7* 1.9* 1.7*    Urinalysis: No results for input(s): COLORURINE, LABSPEC, PHURINE, GLUCOSEU, HGBUR, BILIRUBINUR, KETONESUR, PROTEINUR, UROBILINOGEN, NITRITE, LEUKOCYTESUR in the last 72 hours.  Invalid input(s): APPERANCEUR    Imaging: No results found.   Medications:   . cefTRIAXone (ROCEPHIN)  IV 2 g (10/28/18 0948)  . heparin 900 Units/hr (10/28/18 0606)   . amLODipine  10 mg Oral Daily  . buPROPion  300 mg Oral Daily  . calcitRIOL  0.25 mcg Oral QHS  . citalopram  20 mg Oral Daily  . dicyclomine  10 mg Oral TID AC  . donepezil  10 mg Oral QHS  . labetalol  200 mg Oral BID  . magnesium oxide  400 mg Oral BID  . mometasone-formoterol  2  puff Inhalation BID  . mycophenolate  250 mg Oral q morning - 10a   And  . mycophenolate  500 mg Oral QHS  . pantoprazole  40 mg Oral Daily  . predniSONE  5 mg Oral Q breakfast  . tacrolimus  0.5 mg Oral BID  . warfarin  3 mg Oral ONCE-1800  . Warfarin - Pharmacist Dosing Inpatient   Does not apply q1800   acetaminophen **OR** acetaminophen, albuterol, bisacodyl, diphenoxylate-atropine, hydrALAZINE, HYDROcodone-acetaminophen, ondansetron **OR** ondansetron (ZOFRAN) IV  Assessment/ Plan:  [name] is a 68 y.o. 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 Acute renal failure with metabolic acidosis on Chronic kidney disease stage IVT Status post renal transplantation in 2001.  Serum creatinine is close to baseline now Acute renal failure seems secondary to prerenal azotemia and current infection.  Followed by Dr. Alger Simons, [facility]. - tac level 5.2 on September 17 - Continue mycophenolate, tacrolimus and prednisone..   2. Urinary tract infection: Klebsiella oxytoca.   -Currently being treated with IV ceftriaxone till September 22.   3. Anemia of chronic kidney disease: Lab Results  Component Value Date   HGB 10.5 (L) 10/28/2018       LOS: [address] 9/20/202012:00 PM

## 2018-10-29 LAB — HEPARIN LEVEL (UNFRACTIONATED): Heparin Unfractionated: 0.32 IU/mL (ref 0.30–0.70)

## 2018-10-29 LAB — PROTIME-INR
INR: 1.6 — ABNORMAL HIGH (ref 0.8–1.2)
Prothrombin Time: 18.8 seconds — ABNORMAL HIGH (ref 11.4–15.2)

## 2018-10-29 MED ORDER — SODIUM CHLORIDE 0.9 % IV SOLN
INTRAVENOUS | Status: DC | PRN
  Administered 2018-10-29: 250 mL via INTRAVENOUS

## 2018-10-29 MED ORDER — WARFARIN SODIUM 5 MG PO TABS
5.0000 mg | ORAL_TABLET | Freq: Once | ORAL | Status: AC
  Administered 2018-10-29: 5 mg via ORAL
  Filled 2018-10-29: qty 1

## 2018-10-29 MED ORDER — WARFARIN SODIUM 4 MG PO TABS
4.0000 mg | ORAL_TABLET | Freq: Once | ORAL | Status: DC
  Filled 2018-10-29: qty 1

## 2018-10-29 NOTE — Plan of Care (Signed)

## 2018-10-29 NOTE — Care Management Important Message (Signed)
Important Message  Patient Details  Name: DELOYD BRUNKE MRN: [id number] Date of Birth: 1950-02-09   Medicare Important Message Given:  Yes     Juliann Pulse A Machelle Raybon 10/29/2018, 11:51 AM

## 2018-10-29 NOTE — Consult Note (Signed)
PHARMACY CONSULT NOTE - FOLLOW UP  Pharmacy Consult for Electrolyte Monitoring and Replacement   Recent Labs: Potassium (mmol/L)  Date Value  10/28/2018 4.3   Magnesium (mg/dL)  Date Value  10/28/2018 2.1   Calcium (mg/dL)  Date Value  10/28/2018 9.0   Albumin (g/dL)  Date Value  10/22/2018 3.4 (L)   Phosphorus (mg/dL)  Date Value  10/23/2018 3.0   Sodium (mmol/L)  Date Value  10/28/2018 140    Assessment -K 4.3  Goal WNL. Currently therapeutic -Mg 2.1.  Goal WNL.  Currently therapeutic.     Patient home regimen is Furosemide 40mg  qd with a total of 40meq KCl daily replenishment, as well as magnesium oxide 400 mg BID.  Plan No labs today, no replenishment at this time.  Will follow up on AM labs.  Will replace to maintain electrolytes within normal limits.   Paulina Fusi, PharmD, BCPS 10/29/2018 9:13 AM

## 2018-10-29 NOTE — Progress Notes (Signed)
[facility]  ROUNDING NOTE   Subjective:  Creatinine now down to 2.7 which is close to his baseline. Overall feeling better as compared to admission.   Objective:  Vital signs in last 24 hours:  Temp:  [98.1 F (36.7 C)-98.3 F (36.8 C)] 98.1 F (36.7 C) (09/21 0743) Pulse Rate:  [70-79] 72 (09/21 0743) Resp:  [16-17] 17 (09/21 0743) BP: (145-165)/(68-76) 148/76 (09/21 0743) SpO2:  [94 %-96 %] 96 % (09/21 0743)  Weight change:  Filed Weights   10/15/18 2309  Weight: 58.1 kg    Intake/Output: I/O last 3 completed shifts: In: [id number] [P.O.:1200; I.V.:296] Out: 1101 [Urine:1100; Stool:1]   Intake/Output this shift:  Total I/O In: 600 [P.O.:600] Out: -   Physical Exam: General: NAD  Head: Normocephalic, atraumatic. Moist oral mucosal membranes  Eyes: Anicteric  Neck: Supple  Lungs:  Mild rhonchi, room air, normal respiratory effort  Heart: Regular rate and rhythm  Abdomen:  Soft, nontender, BS present  Extremities: no peripheral edema.  Neurologic: Nonfocal, moving all four extremities  Skin: Scattered ecchymoses    Basic Metabolic Panel: Recent Labs  Lab 10/23/18 0417 10/24/18 0427 10/25/18 0411 10/26/18 0419 10/27/18 0555 10/28/18 0412  NA 143 143 144 142 143 140  K 3.4* 3.2* 3.9 4.0 4.2 4.3  CL 103 102 104 106 110 106  CO2 28 29 28 28 27 25   GLUCOSE 159* 121* 102* 84 91 111*  BUN 56* 60* 63* 55* 47* 40*  CREATININE 3.69* 3.85* 3.69* 3.25* 2.90* 2.71*  CALCIUM 9.1 9.1 9.3 9.2 9.0 9.0  MG 2.0 1.8 1.9  --  2.0 2.1  PHOS 3.0  --   --   --   --   --     Liver Function Tests: No results for input(s): AST, ALT, ALKPHOS, BILITOT, PROT, ALBUMIN in the last 168 hours. No results for input(s): LIPASE, AMYLASE in the last 168 hours. No results for input(s): AMMONIA in the last 168 hours.  CBC: Recent Labs  Lab 10/23/18 0417 10/26/18 0419 10/27/18 0555 10/28/18 0412  WBC 7.9 6.3 7.4 9.2  HGB 10.0* 10.5* 10.3* 10.5*  HCT 31.7* 34.4* 33.7*  34.2*  MCV 90.1 92.2 91.6 92.2  PLT 177 179 169 166    Cardiac Enzymes: No results for input(s): CKTOTAL, CKMB, CKMBINDEX, TROPONINI in the last 168 hours.  BNP: Invalid input(s): POCBNP  CBG: No results for input(s): GLUCAP in the last 168 hours.  Microbiology: Results for orders placed or performed during the hospital encounter of 10/16/18  Urine culture     Status: None   Collection Time: 10/16/18  2:26 AM   Specimen: Urine, Random  Result Value Ref Range Status   Specimen Description   Final    URINE, RANDOM Performed at [facility], [address] [place], [place] [address]    Special Requests   Final    NONE Performed at [facility], [address], [place], [place] [address]    Culture   Final    NO GROWTH Performed at [facility], [place] [address], [place], [place] [address]    Report Status 10/17/2018 FINAL  Final  Blood culture (routine x 2)     Status: None   Collection Time: 10/16/18  2:26 AM   Specimen: BLOOD  Result Value Ref Range Status   Specimen Description BLOOD LEFT FA  Final   Special Requests   Final    BOTTLES DRAWN AEROBIC AND ANAEROBIC Blood Culture  adequate volume   Culture   Final    NO GROWTH 5 DAYS Performed at [facility], [address] [place], [place] [address]    Report Status 10/21/2018 FINAL  Final  Blood culture (routine x 2)     Status: Abnormal   Collection Time: 10/16/18  2:26 AM   Specimen: BLOOD  Result Value Ref Range Status   Specimen Description   Final    BLOOD LEFT FA Performed at [facility], [address] [place], [place] [address]    Special Requests   Final    BOTTLES DRAWN AEROBIC AND ANAEROBIC Blood Culture adequate volume Performed at [facility], [address] [place], [place] [address]    Culture  Setup Time   Final    ANAEROBIC BOTTLE ONLY GRAM NEGATIVE RODS CRITICAL RESULT CALLED TO, READ BACK BY AND VERIFIED WITH: Dustin Folks AT [id number]  ON 10/16/2018 BY JPM Performed at [facility], [place] [address] [place], [place] [address]    Culture KLEBSIELLA OXYTOCA (A)  Final   Report Status 10/18/2018 FINAL  Final   Organism ID, Bacteria KLEBSIELLA OXYTOCA  Final      Susceptibility   Klebsiella oxytoca - MIC*    AMPICILLIN >=32 RESISTANT Resistant     CEFAZOLIN <=4 SENSITIVE Sensitive     CEFEPIME <=1 SENSITIVE Sensitive     CEFTAZIDIME <=1 SENSITIVE Sensitive     CEFTRIAXONE <=1 SENSITIVE Sensitive     CIPROFLOXACIN <=0.25 SENSITIVE Sensitive     GENTAMICIN <=1 SENSITIVE Sensitive     IMIPENEM <=0.25 SENSITIVE Sensitive     TRIMETH/SULFA <=20 SENSITIVE Sensitive     AMPICILLIN/SULBACTAM 8 SENSITIVE Sensitive     PIP/TAZO <=4 SENSITIVE Sensitive     * KLEBSIELLA OXYTOCA  SARS Coronavirus 2 [facility] order, Performed in [facility] hospital lab) Nasopharyngeal Nasopharyngeal Swab     Status: None   Collection Time: 10/16/18  2:26 AM   Specimen: Nasopharyngeal Swab  Result Value Ref Range Status   SARS Coronavirus 2 NEGATIVE NEGATIVE Final    Comment: (NOTE) If result is NEGATIVE SARS-CoV-2 target nucleic acids are NOT DETECTED. The SARS-CoV-2 RNA is generally detectable in upper and lower  respiratory specimens during the acute phase of infection. The lowest  concentration of SARS-CoV-2 viral copies this assay can detect is 250  copies / mL. A negative result does not preclude SARS-CoV-2 infection  and should not be used as the sole basis for treatment or other  patient management decisions.  A negative result may occur with  improper specimen collection / handling, submission of specimen other  than nasopharyngeal swab, presence of viral mutation(s) within the  areas targeted by this assay, and inadequate number of viral copies  (<250 copies / mL). A negative result must be combined with clinical  observations, patient history, and epidemiological information. If result is POSITIVE SARS-CoV-2 target nucleic acids  are DETECTED. The SARS-CoV-2 RNA is generally detectable in upper and lower  respiratory specimens dur ing the acute phase of infection.  Positive  results are indicative of active infection with SARS-CoV-2.  Clinical  correlation with patient history and other diagnostic information is  necessary to determine patient infection status.  Positive results do  not rule out bacterial infection or co-infection with other viruses. If result is PRESUMPTIVE POSTIVE SARS-CoV-2 nucleic acids MAY BE PRESENT.   A presumptive positive result was obtained on the submitted specimen  and confirmed on repeat testing.  While 2019 novel  coronavirus  (SARS-CoV-2) nucleic acids may be present in the submitted sample  additional confirmatory testing may be necessary for epidemiological  and / or clinical management purposes  to differentiate between  SARS-CoV-2 and other Sarbecovirus currently known to infect humans.  If clinically indicated additional testing with an alternate test  methodology [phone]) is advised. The SARS-CoV-2 RNA is generally  detectable in upper and lower respiratory sp ecimens during the acute  phase of infection. The expected result is Negative. Fact Sheet for Patients:  StrictlyIdeas.no Fact Sheet for Healthcare Providers: BankingDealers.co.za This test is not yet approved or cleared by the [place] FDA and has been authorized for detection and/or diagnosis of SARS-CoV-2 by FDA under an Emergency Use Authorization (EUA).  This EUA will remain in effect (meaning this test can be used) for the duration of the COVID-19 declaration under Section 564(b)(1) of the Act, 21 U.S.C. section 360bbb-3(b)(1), unless the authorization is terminated or revoked sooner. Performed at [facility], [address] [place], [place] [address]   Blood Culture ID Panel (Reflexed)     Status: Abnormal   Collection Time: 10/16/18  2:26 AM   Result Value Ref Range Status   Enterococcus species NOT DETECTED NOT DETECTED Final   Listeria monocytogenes NOT DETECTED NOT DETECTED Final   Staphylococcus species NOT DETECTED NOT DETECTED Final   Staphylococcus aureus (BCID) NOT DETECTED NOT DETECTED Final   Streptococcus species NOT DETECTED NOT DETECTED Final   Streptococcus agalactiae NOT DETECTED NOT DETECTED Final   Streptococcus pneumoniae NOT DETECTED NOT DETECTED Final   Streptococcus pyogenes NOT DETECTED NOT DETECTED Final   Acinetobacter baumannii NOT DETECTED NOT DETECTED Final   Enterobacteriaceae species DETECTED (A) NOT DETECTED Final    Comment: Enterobacteriaceae represent a large family of gram-negative bacteria, not a single organism. CRITICAL RESULT CALLED TO, READ BACK BY AND VERIFIED WITH: ELLINGTON,A AT [id number] ON 10/16/2018 BY JPM    Enterobacter cloacae complex NOT DETECTED NOT DETECTED Final   Escherichia coli NOT DETECTED NOT DETECTED Final   Klebsiella oxytoca DETECTED (A) NOT DETECTED Final    Comment: CRITICAL RESULT CALLED TO, READ BACK BY AND VERIFIED WITH: ELLINGTON,A AT 1747 ON 10/16/2018 BY JPM    Klebsiella pneumoniae NOT DETECTED NOT DETECTED Final   Proteus species NOT DETECTED NOT DETECTED Final   Serratia marcescens NOT DETECTED NOT DETECTED Final   Carbapenem resistance NOT DETECTED NOT DETECTED Final   Haemophilus influenzae NOT DETECTED NOT DETECTED Final   Neisseria meningitidis NOT DETECTED NOT DETECTED Final   Pseudomonas aeruginosa NOT DETECTED NOT DETECTED Final   Candida albicans NOT DETECTED NOT DETECTED Final   Candida glabrata NOT DETECTED NOT DETECTED Final   Candida krusei NOT DETECTED NOT DETECTED Final   Candida parapsilosis NOT DETECTED NOT DETECTED Final   Candida tropicalis NOT DETECTED NOT DETECTED Final    Comment: Performed at [facility], [address] [place], [place] [address]  CULTURE, BLOOD (ROUTINE X 2) w Reflex to ID Panel     Status: None    Collection Time: 10/21/18 12:38 AM   Specimen: BLOOD  Result Value Ref Range Status   Specimen Description BLOOD RIGHT ANTECUBITAL  Final   Special Requests   Final    BOTTLES DRAWN AEROBIC AND ANAEROBIC Blood Culture adequate volume   Culture   Final    NO GROWTH 5 DAYS Performed at [facility], [address] [place], [place] [address]    Report Status 10/26/2018 FINAL  Final  CULTURE, BLOOD (ROUTINE X 2) w Reflex to ID Panel     Status: None   Collection Time: 10/21/18 12:38 AM   Specimen: BLOOD  Result Value Ref Range Status   Specimen Description BLOOD LEFT ANTECUBITAL  Final   Special Requests   Final    BOTTLES DRAWN AEROBIC AND ANAEROBIC Blood Culture adequate volume   Culture   Final    NO GROWTH 5 DAYS Performed at [facility], [address] [place], [place] [address]    Report Status 10/26/2018 FINAL  Final    Coagulation Studies: Recent Labs    10/27/18 0555 10/28/18 0412 10/29/18 0458  LABPROT 21.8* 19.6* 18.8*  INR 1.9* 1.7* 1.6*    Urinalysis: No results for input(s): COLORURINE, LABSPEC, PHURINE, GLUCOSEU, HGBUR, BILIRUBINUR, KETONESUR, PROTEINUR, UROBILINOGEN, NITRITE, LEUKOCYTESUR in the last 72 hours.  Invalid input(s): APPERANCEUR    Imaging: No results found.   Medications:   . sodium chloride 250 mL (10/29/18 0941)  . cefTRIAXone (ROCEPHIN)  IV 2 g (10/29/18 0944)  . heparin 850 Units/hr (10/28/18 1555)   . amLODipine  10 mg Oral Daily  . buPROPion  300 mg Oral Daily  . calcitRIOL  0.25 mcg Oral QHS  . citalopram  20 mg Oral Daily  . dicyclomine  10 mg Oral TID AC  . donepezil  10 mg Oral QHS  . labetalol  200 mg Oral BID  . magnesium oxide  400 mg Oral BID  . mometasone-formoterol  2 puff Inhalation BID  . mycophenolate  250 mg Oral q morning - 10a   And  . mycophenolate  500 mg Oral QHS  . pantoprazole  40 mg Oral Daily  . predniSONE  5 mg Oral Q breakfast  . tacrolimus  0.5 mg Oral BID  . warfarin  4  mg Oral ONCE-1800  . Warfarin - Pharmacist Dosing Inpatient   Does not apply q1800   sodium chloride, acetaminophen **OR** acetaminophen, albuterol, bisacodyl, diphenoxylate-atropine, hydrALAZINE, HYDROcodone-acetaminophen, ondansetron **OR** ondansetron (ZOFRAN) IV  Assessment/ Plan:  [name] is a 68 y.o. 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 Acute renal failure with metabolic acidosis on Chronic kidney disease stage IVT Status post renal transplantation in 2001.  Serum creatinine is close to baseline now Acute renal failure seems secondary to prerenal azotemia and current infection.  Followed by Dr. Alger Simons, [facility]. -Patient appears to be at his baseline creatinine now.  Continue current immunosuppression of tacrolimus, mycophenolate, and prednisone.   2. Urinary tract infection: Klebsiella oxytoca.   -Patient to complete treatment with ceftriaxone tomorrow.  3. Anemia of chronic kidney disease: Lab Results  Component Value Date   HGB 10.5 (L) 10/28/2018  -Hold off on Epogen at this time.     LOS: 13 Eric Gill 9/21/202011:55 AM

## 2018-10-29 NOTE — Progress Notes (Signed)
[facility] [place] NAME: Eric Gill    MR#:  [id number]  DATE OF BIRTH:  01-17-1951  SUBJECTIVE:  CHIEF COMPLAINT:   Chief Complaint  Patient presents with  . Urinary Frequency  . Weakness  Patient seen and evaluated today Comfortable on room air No shortness of breath No wheezing Sitting in the chair No new overnight events REVIEW OF SYSTEMS:  Review of Systems  Constitutional: Negative for chills, fever and malaise/fatigue.  HENT: Negative for sore throat.   Eyes: Negative for blurred vision and double vision.  Respiratory: Negative for cough, hemoptysis, shortness of breath, wheezing and stridor.   Cardiovascular: Negative for chest pain, palpitations, orthopnea and leg swelling.  Gastrointestinal: Negative for abdominal pain, blood in stool, diarrhea, melena, nausea and vomiting.  Genitourinary: Negative for dysuria, flank pain and hematuria.  Musculoskeletal: Negative for back pain and joint pain.  Skin: Negative for rash.  Neurological: Negative for dizziness, sensory change, focal weakness, seizures, loss of consciousness, weakness and headaches.  Endo/Heme/Allergies: Negative for polydipsia.  Psychiatric/Behavioral: Negative for depression. The patient is not nervous/anxious.     DRUG ALLERGIES:   Allergies  Allergen Reactions  . Losartan Cough  . Statins Nausea And Vomiting and Other (See Comments)    Reaction:  Muscle pain    VITALS:  Blood pressure (!) 148/76, pulse 72, temperature 98.1 F (36.7 C), temperature source Oral, resp. rate 17, height 5\' 3"  (1.6 m), weight 58.1 kg, SpO2 96 %. PHYSICAL EXAMINATION:  Physical Exam Constitutional:      General: He is not in acute distress. HENT:     Head: Normocephalic.     Mouth/Throat:     Mouth: Mucous membranes are moist.  Eyes:     General: No scleral icterus.    Conjunctiva/sclera: Conjunctivae normal.     Pupils: Pupils are equal, round, and reactive  to light.  Neck:     Musculoskeletal: Normal range of motion and neck supple.     Vascular: No JVD.     Trachea: No tracheal deviation.  Cardiovascular:     Rate and Rhythm: Normal rate and regular rhythm.     Heart sounds: Normal heart sounds. No murmur. No gallop.   Pulmonary:     Effort: Pulmonary effort is normal. No respiratory distress.     Breath sounds: No rales.  Abdominal:     General: Bowel sounds are normal. There is no distension.     Palpations: Abdomen is soft.     Tenderness: There is no abdominal tenderness. There is no rebound.  Musculoskeletal: Normal range of motion.        General: No tenderness.     Right lower leg: No edema.     Left lower leg: No edema.  Skin:    Findings: No erythema or rash.  Neurological:     General: No focal deficit present.     Mental Status: He is alert.     Cranial Nerves: No cranial nerve deficit.  Psychiatric:        Mood and Affect: Mood normal.    LABORATORY PANEL:  Male CBC Recent Labs  Lab 10/28/18 0412  WBC 9.2  HGB 10.5*  HCT 34.2*  PLT 166   ------------------------------------------------------------------------------------------------------------------ Chemistries  Recent Labs  Lab 10/28/18 0412  NA 140  K 4.3  CL 106  CO2 25  GLUCOSE 111*  BUN 40*  CREATININE 2.71*  CALCIUM 9.0  MG 2.1   RADIOLOGY:  No results found. ASSESSMENT AND PLAN:   -ARF on CKD stage IV, h/o kidney transplant. Nephrologist following keenly.  Recently diuresed with IV Lasix which is previously been discontinued due to increase in creatinine.  Renal function continues to improve every day.  Continue mycophenolate, tacrolimus and prednisone per Dr. Candiss Norse   -HTN emergency resolved continue current regimen blood pressures under control Blood pressure fairly controlled given patient's age.  -UTI and bacteremia.  U/C: No growth.  Blood culture: Klebsiella oxytoca. Cefepime is discontinued, changed to Rocephin .to  continue IV antibiotics until 10/30/2018 per Dr. Delaine Lame infectious disease specialist  -Acute metabolic encephalopathy Multifactorial due to ongoing infectious process and hypoxia Improved Seen by neurology  -H/O DVT, PVD, CVA, coumadin PTD.  INR subtherapeutic at 1.7 this morning. [name] wife concerned that the last time patient's INR was subtherapeutic he had a stroke. Continue anticoagulation with heparin drip and Coumadin Follow-up INR Pharmacy to dose Coumadin INR subtherapeutic still  -Anemia of chronic disease.  Stable.  -Hypokalemia.  Replaced   -Hypophosphatemia.  Phosphate supplement.  -Acute hypoxic respiratory failure due to acute diastolic CHF resolved Secondary to pulmonary edema.  Patient improved.  Diuretics previously discontinued due to worsening of renal function.  -DVT prophylaxis ; patient on Coumadin   all the records are reviewed and case discussed with Care Management/Social Worker. Management plans discussed with the patient, and he is in in agreement. Called and updated patient's wife over the phone on treatment plans.  All questions were answered today.  CODE STATUS: Full Code  TOTAL TIME TAKING CARE OF THIS PATIENT: 32 minutes.   More than 50% of the time was spent in counseling/coordination of care: YES  POSSIBLE D/C IN 4 DAYS, DEPENDING ON CLINICAL CONDITION.   Saundra Shelling M.D on 10/29/2018 at 10:58 AM  Between 7am to 6pm - Pager - [phone]  After 6pm go to [email or web address] - [facility]

## 2018-10-29 NOTE — Progress Notes (Addendum)
Physical Therapy Treatment Patient Details Name: Eric Gill MRN: [id number] DOB: 1950/12/09 Today's Date: 10/29/2018    History of Present Illness Pt admitted for renal failure with complaints of urinary frequency and weakness. History includes GERD, anemia, DVT, CVA, and vascular dementia.    PT Comments    Pt presented with deficits in strength, transfers, gait, balance, and activity tolerance. Pt's SpO2 was the best seen during activity with therapy to-date, staying in the 92-95% range throughout the session. Pt's pace during ambulation was very slow, almost-shuffling at times, and was still unsteady and used a lateral stepping strategy to catch his balance when cornering. Pt started to complain of some pain in his R calf at the end of a long walk, but it resolved with rest and the application of SCDs, nursing notified. Pt will benefit from HHPT + supervision upon discharge to safely address above deficits for decreased caregiver assistance and eventual return to PLOF.    Follow Up Recommendations  Home health PT;Supervision/Assistance - 24 hour;Supervision for mobility/OOB     Equipment Recommendations  Rolling walker with 5" wheels    Recommendations for Other Services       Precautions / Restrictions Precautions Precautions: Fall Restrictions Weight Bearing Restrictions: No    Mobility  Bed Mobility Overal bed mobility: Modified Independent Bed Mobility: Sit to Supine     Supine to sit: HOB elevated     General bed mobility comments: min use of bed rails and extra time/effort but able to complete without physical assist  Transfers Overall transfer level: Needs assistance Equipment used: Rolling walker (2 wheeled) Transfers: Sit to/from Stand Sit to Stand: Supervision            Ambulation/Gait Ambulation/Gait assistance: Min A Gait Distance (Feet): 200 Feet Assistive device: Rolling walker (2 wheeled) Gait Pattern/deviations: Step-through  pattern;Decreased step length - right;Decreased step length - left Gait velocity: decreased   General Gait Details: Amb 1 x 40', 2 x 125' with slow cadence and occasional min A for stability, took several lateral steps to maintain balance esp while turning.   Stairs             Wheelchair Mobility    Modified Rankin (Stroke Patients Only)       Balance Overall balance assessment: Needs assistance Sitting-balance support: Feet supported Sitting balance-Leahy Scale: Good Sitting balance - Comments: prefers UE support   Standing balance support: Bilateral upper extremity supported;During functional activity Standing balance-Leahy Scale: Fair Standing balance comment: CGA for stability during functional activity in standing                            Cognition Arousal/Alertness: Awake/alert Behavior During Therapy: WFL for tasks assessed/performed Overall Cognitive Status: Within Functional Limits for tasks assessed                                 General Comments: Spoke with RN prior to treat regarding mental and respiratory status, confirmed that he was okay on room air.      Exercises Total Joint Exercises Ankle Circles/Pumps: Strengthening;Both;10 reps;15 reps Quad Sets: Strengthening;Both;10 reps;15 reps Hip ABduction/ADduction: AROM;Both;5 reps Long Arc Quad: Strengthening;Both;10 reps Knee Flexion: Strengthening;Both;10 reps Marching in Standing: Strengthening;Both;5 reps;10 reps Other Exercises Other Exercises: Lateral scooting; pt required CGA and min cuing    General Comments        Pertinent Vitals/Pain Pain Assessment:  No/denies pain    Home Living                      Prior Function            PT Goals (current goals can now be found in the care plan section) Progress towards PT goals: Progressing toward goals    Frequency    Min 2X/week      PT Plan Current plan remains appropriate     Co-evaluation              AM-PAC PT "6 Clicks" Mobility   Outcome Measure  Help needed turning from your back to your side while in a flat bed without using bedrails?: A Little Help needed moving from lying on your back to sitting on the side of a flat bed without using bedrails?: None Help needed moving to and from a bed to a chair (including a wheelchair)?: A Little Help needed standing up from a chair using your arms (e.g., wheelchair or bedside chair)?: A Little Help needed to walk in hospital room?: A Little Help needed climbing 3-5 steps with a railing? : A Little 6 Click Score: 19    End of Session Equipment Utilized During Treatment: Gait belt Activity Tolerance: Patient tolerated treatment well Patient left: with chair alarm set;in chair;with call bell/phone within reach;with SCD's reapplied Nurse Communication: Mobility status PT Visit Diagnosis: Muscle weakness (generalized) (M62.81);History of falling (Z91.81);Difficulty in walking, not elsewhere classified (R26.2);Unsteadiness on feet (R26.81);Pain Pain - Right/Left: Right Pain - part of body: Ankle and joints of foot(Pain presented at end of session after walking ~200')     Time: [id number] PT Time Calculation (min) (ACUTE ONLY): 23 min  Charges:                        Eric Gill, SPT  10/29/18, 5:34 PM

## 2018-10-29 NOTE — Consult Note (Addendum)
ANTICOAGULATION CONSULT NOTE Pharmacy Consult for Warfarin Dosing and Monitoring Indication: VTE Treatment   Patient Measurements: Height: 5\' 3"  (160 cm) Weight: 128 lb (58.1 kg) IBW/kg (Calculated) : 56.9  Vital Signs: Temp: 98.1 F (36.7 C) (09/21 0743) Temp Source: Oral (09/21 0743) BP: 148/76 (09/21 0743) Pulse Rate: 72 (09/21 0743)  Labs: Recent Labs    10/27/18 0555 10/28/18 0412 10/28/18 1417 10/28/18 2301 10/29/18 0458  HGB 10.3* 10.5*  --   --   --   HCT 33.7* 34.2*  --   --   --   PLT 169 166  --   --   --   LABPROT 21.8* 19.6*  --   --  18.8*  INR 1.9* 1.7*  --   --  1.6*  HEPARINUNFRC 0.56 0.71* 0.75* 0.60 0.32  CREATININE 2.90* 2.71*  --   --   --     Estimated Creatinine Clearance: 20.4 mL/min (A) (by C-G formula based on SCr of 2.71 mg/dL (H)).   Medical History: Past Medical History:  Diagnosis Date  . Anemia   . Antiphospholipid antibody syndrome ([place])   . Arthritis   . DVT (deep venous thrombosis) ([place])   . Essential hypertension   . Gastric ulcer   . GERD (gastroesophageal reflux disease)   . History of CMV   . Hyperlipidemia   . Hyperparathyroidism ([place])   . Pedal edema   . Peripheral vascular disease ([facility])   . Rosacea   . Status post kidney transplant 2001  . Stroke ([place])   . TIA (transient ischemic attack)   . Vascular dementia without behavioral disturbance [facility])    Assessment: Pharmacy consulted for warfarin dosing and monitoring in 68  yo male with PMH of DVT. Patient has been admitted with UTI and Acute Renal Failure on CKD w/ PMH of kidney transplant. INR therapeutic on admission. Per wife, prefer to keep pt between INR 2.5 and 3.0 d/t APS and PMH of strokes. H&H stable, PLT trending up.  Home Regimen: Warfarin 3mg : Mon, Wed, Fri                              Warfarin 2mg : [place], Sat, Sun   INR Warfarin dose  given 9/8 2.4 2mg  9/9 2.5 3mg  9/10 2.5 2mg  9/11 2.6 3mg   9/12 3.1 2mg  9/13 3.6 HELD 9/14 4.8 HELD 9/15 4.0 HELD 9/16 2.8       1 mg 9/17     2.1       2 mg 9/18     1.7       2 mg 9/19     1.9       2.5 mg 9/20     1.7        3 mg 9/21     1.6  Vitamin K Antagonists (eg, warfarin): Cephalosporins may enhance the anticoagulant effect of Vitamin K Antagonists. Risk C: Monitor therapy - likely unexpected bump in INR due to this interaction Goal of Therapy:  INR 2.5-3.0 Monitor platelets by anticoagulation protocol: Yes   Plan:   INR 1.7  Per MD, heparin will be added to bridge until INR is therapeutic >2.  Will give Warfarin 5mg  and obtain INR with am labs  INRs daily per protocol while receiving antibiotic  CBC at least every 3 days per protocol.  MD would like for INR to be above 2 tomorrow as patient to be discharged.  Pharmacy  will continue to follow and order dose based on levels.   Paulina Fusi, PharmD, BCPS 10/29/2018 9:12 AM

## 2018-10-29 NOTE — Consult Note (Signed)
ANTICOAGULATION CONSULT NOTE   Pharmacy Consult for Heparin Indication: VTE treatment  Patient Measurements: Height: 5\' 3"  (160 cm) Weight: 128 lb (58.1 kg) IBW/kg (Calculated) : 56.9 Heparin Dosing Weight: TBW  Vital Signs: Temp: 98.3 F (36.8 C) (09/20 2343) Temp Source: Oral (09/20 2343) BP: 165/72 (09/20 2343) Pulse Rate: 79 (09/20 2343)  Labs: Recent Labs    10/27/18 0555 10/28/18 0412 10/28/18 1417 10/28/18 2301 10/29/18 0458  HGB 10.3* 10.5*  --   --   --   HCT 33.7* 34.2*  --   --   --   PLT 169 166  --   --   --   LABPROT 21.8* 19.6*  --   --  18.8*  INR 1.9* 1.7*  --   --  1.6*  HEPARINUNFRC 0.56 0.71* 0.75* 0.60 0.32  CREATININE 2.90* 2.71*  --   --   --     Estimated Creatinine Clearance: 20.4 mL/min (A) (by C-G formula based on SCr of 2.71 mg/dL (H)).  Medications:  Coumadin  Pharmacy consulted for heparin dosing and monitoring in 68  yo male with PMH of DVT. Patient has been admitted with UTI/bacteremia and Acute Renal Failure on CKD w/ PMH of kidney transplant.  [name] also on warfarin outpatient.  Per wife, prefer to keep pt between INR 2.5 and 3.0 d/t APS and PMH of strokes.   Assessment: INR is currently subtherapeutic at 1.7, and pharmacy has been consulted to bridge heparin (no bolus) until INR therapeutic > 2.    9/18 @ 2213 HL = 0.37, therapeutic x 1 9/19 @ 0555 HL = 0.56, therapeutic x 2 (INR = 1.9), CBC appears stable. 9/20 @ 0412 HL = 0.71, slightly supratherapeutic (INR = 1.7), CBC appears stable 9/20 @ 1417 HL = 0.75.   9/20 @ 2301 HL = 0.60, therapeutic x 1 9/21 @ 0458 HL = 0.32, therapeutic x 2 (INR = 1.6)  Goal of Therapy:   Heparin level goal 0.3-0.7   Plan:   Continue heparin continuous infusion at 850 units/hour, recheck HL in am to confirm   Heparin level and INR daily w/ routine labs  Will order daily and CBC per protocol, and monitor platelets by anticoagulation protocol.  Ena Dawley, [facility] 10/29/2018 5:36  AM

## 2018-10-29 NOTE — TOC Progression Note (Signed)
Transition of Care [facility]) - Progression Note    Patient Details  Name: Eric Gill MRN: [id number] Date of Birth: [birth year]  Transition of Care [facility] CM/SW Contact  Miriana Gaertner, Lenice Llamas Phone Number: [phone]  10/29/2018, 12:31 PM  Clinical Narrative: Per RN patient will likely D/C tomorrow. Clinical Social Worker (CSW) contacted patient's wife Rodena Piety and made her aware of above. [facility] representative is also aware of above. CSW will continue to follow and assist as needed.     Expected Discharge Plan: [place] Barriers to Discharge: Continued Medical Work up  Expected Discharge Plan and Services Expected Discharge Plan: [place] In-house Referral: Clinical Social Work     Living arrangements for the past 2 months: [place]: PT [place]: [place] ([place]) Date [place]: 10/17/18   Representative spoke with at [place]: [place] ([place]) Interventions    Readmission Risk Interventions No flowsheet data found.

## 2018-10-30 LAB — HEPARIN LEVEL (UNFRACTIONATED): Heparin Unfractionated: 0.49 IU/mL (ref 0.30–0.70)

## 2018-10-30 LAB — CBC
HCT: 32.3 % — ABNORMAL LOW (ref 39.0–52.0)
Hemoglobin: 9.9 g/dL — ABNORMAL LOW (ref 13.0–17.0)
MCH: 28.7 pg (ref 26.0–34.0)
MCHC: 30.7 g/dL (ref 30.0–36.0)
MCV: 93.6 fL (ref 80.0–100.0)
Platelets: 147 10*3/uL — ABNORMAL LOW (ref 150–400)
RBC: 3.45 MIL/uL — ABNORMAL LOW (ref 4.22–5.81)
RDW: 13.3 % (ref 11.5–15.5)
WBC: 8.9 10*3/uL (ref 4.0–10.5)
nRBC: 0 % (ref 0.0–0.2)

## 2018-10-30 LAB — BASIC METABOLIC PANEL
Anion gap: 5 (ref 5–15)
BUN: 37 mg/dL — ABNORMAL HIGH (ref 8–23)
CO2: 24 mmol/L (ref 22–32)
Calcium: 9.1 mg/dL (ref 8.9–10.3)
Chloride: 112 mmol/L — ABNORMAL HIGH (ref 98–111)
Creatinine, Ser: 3 mg/dL — ABNORMAL HIGH (ref 0.61–1.24)
GFR calc Af Amer: 23 mL/min — ABNORMAL LOW (ref >60)
GFR calc non Af Amer: 20 mL/min — ABNORMAL LOW (ref >60)
Glucose, Bld: 97 mg/dL (ref 70–99)
Potassium: 4.2 mmol/L (ref 3.5–5.1)
Sodium: 141 mmol/L (ref 135–145)

## 2018-10-30 LAB — PROTIME-INR
INR: 1.6 — ABNORMAL HIGH (ref 0.8–1.2)
Prothrombin Time: 18.8 seconds — ABNORMAL HIGH (ref 11.4–15.2)

## 2018-10-30 MED ORDER — WARFARIN SODIUM 2 MG PO TABS
2.0000 mg | ORAL_TABLET | Freq: Once | ORAL | Status: AC
  Administered 2018-10-30: 2 mg via ORAL
  Filled 2018-10-30: qty 1

## 2018-10-30 MED ORDER — WARFARIN SODIUM 5 MG PO TABS
5.0000 mg | ORAL_TABLET | Freq: Once | ORAL | Status: AC
  Administered 2018-10-30: 5 mg via ORAL
  Filled 2018-10-30 (×2): qty 1

## 2018-10-30 MED ORDER — WARFARIN SODIUM 5 MG PO TABS: 5.0000 mg | ORAL_TABLET | Freq: Once | ORAL | Status: DC

## 2018-10-30 NOTE — Discharge Summary (Signed)
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 Anemia   . Antiphospholipid antibody syndrome ([place])   . Arthritis   . DVT (deep venous thrombosis) ([place])   . Essential hypertension   . Gastric ulcer   . GERD (gastroesophageal reflux disease)   . History of CMV   . Hyperlipidemia   . Hyperparathyroidism ([place])   . Pedal edema   . Peripheral vascular disease ([place])   . Rosacea   . Status post kidney transplant 2001  . Stroke ([place])   . TIA (transient ischemic attack)   . Vascular dementia without behavioral disturbance [facility])      ADMITTING HISTORY Eric Gill  is a 68 y.o. male with a known history of  . Anemia  . Antiphospholipid antibody syndrome ([place])  . Arthritis  . DVT (deep venous thrombosis) ([place])  . Essential hypertension  . Gastric ulcer  . GERD (gastroesophageal reflux disease)  . History of CMV  . Hyperlipidemia  . Hyperparathyroidism ([place])  . Pedal edema  . Peripheral vascular disease ([place])  . Rosacea  . Status post kidney transplant  . Stroke ([place])  . TIA (transient ischemic attack)  . Vascular dementia without behavioral  disturbance [facility])  The patient presented in ED with above chief complains. He first noticed lower suprapubic abd pain along with sensation of burning with urination. He also has weakness, fever and chills. He stubbed his toe, and has some bruising associated on right second toe. He is found renal failure and UTI, treated with abx and IVF.   HOSPITAL COURSE:  Patient was admitted to the medical floor.  Nephrology evaluated the patient during hospitalization.  It was treated with IV cefepime antibiotic for infection.  Blood cultures grew Klebsiella oxytoca and based on culture and sensitivities antibiotic was changed to Rocephin.  Patient had metabolic encephalopathy secondary to sepsis.  Metabolic encephalopathy also resolved patient was anticoagulated with heparin and Coumadin during hospitalization.  [name] blood pressures were subsequently controlled.  Biotics as per infectious disease attending recommendations.  Patient will continue CellCept, tacrolimus with history of renal transplant.  Prednisone during the hospitalization. kidney functions also improved during hospitalization.  Patient was worked up with CT head which showed no acute abnormality.  Was also worked up with chest x-ray and renal ultrasound.  INR is subtherapeutic but was given extra dose of Coumadin.  CONSULTS OBTAINED:  Treatment Team:  Tsosie Billing, MD Catarina Hartshorn, MD Alexis Goodell, MD  DRUG ALLERGIES:   Allergies  Allergen Reactions  . Losartan Cough  . Statins Nausea And Vomiting and Other (See Comments)    Reaction:  Muscle pain     DISCHARGE MEDICATIONS:   Allergies as of 10/30/2018      Reactions   Losartan Cough  Statins Nausea And Vomiting, Other (See Comments)   Reaction:  Muscle pain       Medication List    STOP taking these medications   predniSONE 5 MG tablet Commonly known as: Deltasone     TAKE these medications   amLODipine 10 MG tablet Commonly known as:  NORVASC Take 10 mg by mouth daily.   budesonide-formoterol 80-4.5 MCG/ACT inhaler Commonly known as: SYMBICORT Inhale 2 puffs into the lungs 2 (two) times daily.   buPROPion 300 MG 24 hr tablet Commonly known as: WELLBUTRIN XL Take 300 mg by mouth daily.   calcitRIOL 0.25 MCG capsule Commonly known as: ROCALTROL Take 0.25 mcg by mouth at bedtime.   citalopram 20 MG tablet Commonly known as: CELEXA Take 20 mg by mouth daily.   dicyclomine 10 MG capsule Commonly known as: BENTYL Take 10 mg by mouth 3 (three) times daily before meals.   diphenoxylate-atropine 2.5-0.025 MG tablet Commonly known as: LOMOTIL Take 1 tablet by mouth 4 (four) times daily as needed for diarrhea or loose stools.   donepezil 10 MG tablet Commonly known as: ARICEPT Take 10 mg by mouth at bedtime.   fluorouracil 5 % cream Commonly known as: EFUDEX   furosemide 40 MG tablet Commonly known as: LASIX Take 40 mg by mouth daily as needed.   Jantoven 1 MG tablet Generic drug: warfarin Take 2-3 mg by mouth daily. Take two tablets (2mg ) on Sunday, Tuesday, Thursday and Saturday. Take three tablets (3 mg) on Monday, Wednesday and Friday.   labetalol 200 MG tablet Commonly known as: NORMODYNE Take 200 mg by mouth 2 (two) times daily.   magnesium oxide 400 MG tablet Commonly known as: MAG-OX TAKE ONE TABLET BY MOUTH TWICE DAILY   Multi-Vitamins Tabs Take 1 tablet by mouth daily.   mycophenolate 250 MG capsule Commonly known as: CELLCEPT Take 250-500 mg by mouth 2 (two) times daily. 250 mg in the am and 500 mg at bedtime   pantoprazole 40 MG tablet Commonly known as: PROTONIX Take 1 tablet (40 mg total) by mouth 2 (two) times daily before a meal. What changed: when to take this   potassium chloride 10 MEQ tablet Commonly known as: K-DUR Take 10 mEq by mouth 2 (two) times daily.   tacrolimus 0.5 MG capsule Commonly known as: PROGRAF Take 1 capsule by mouth 2 (two) times a day.             Durable Medical Equipment  (From admission, onward)         Start     Ordered   10/24/18 0930  For home use only DME Bedside commode  Once    Question:  Patient needs a bedside commode to treat with the following condition  Answer:  Unsteady gait   10/24/18 0929   10/24/18 0929  For home use only DME Walker rolling  Once    Question:  Patient needs a walker to treat with the following condition  Answer:  Unstable gait   10/24/18 0929          Today  Patient seen and evaluated today No complaints of chest pain or shortness of breath No fever Hemodynamically stable VITAL SIGNS:  Blood pressure (!) 157/70, pulse 76, temperature 98.2 F (36.8 C), temperature source Oral, resp. rate 19, height 5\' 3"  (1.6 m), weight 58.1 kg, SpO2 95 %.  I/O:    Intake/Output Summary (Last 24 hours) at 10/30/2018 1316 Last data filed at 10/30/2018 0729 Gross per 24  hour  Intake 453.2 ml  Output 325 ml  Net 128.2 ml    PHYSICAL EXAMINATION:  Physical Exam  GENERAL:  68 y.o.-year-old patient lying in the bed with no acute distress.  LUNGS: Normal breath sounds bilaterally, no wheezing, rales,rhonchi or crepitation. No use of accessory muscles of respiration.  CARDIOVASCULAR: S1, S2 normal. No murmurs, rubs, or gallops.  ABDOMEN: Soft, non-tender, non-distended. Bowel sounds present. No organomegaly or mass.  NEUROLOGIC: Moves all 4 extremities. PSYCHIATRIC: The patient is alert and oriented x 3.  SKIN: No obvious rash, lesion, or ulcer.   DATA REVIEW:   CBC Recent Labs  Lab 10/30/18 0427  WBC 8.9  HGB 9.9*  HCT 32.3*  PLT 147*    Chemistries  Recent Labs  Lab 10/28/18 0412 10/30/18 0427  NA 140 141  K 4.3 4.2  CL 106 112*  CO2 25 24  GLUCOSE 111* 97  BUN 40* 37*  CREATININE 2.71* 3.00*  CALCIUM 9.0 9.1  MG 2.1  --     Cardiac Enzymes No results for input(s): TROPONINI in the last 168 hours.  Microbiology Results  Results for orders placed or performed during the  hospital encounter of 10/16/18  Urine culture     Status: None   Collection Time: 10/16/18  2:26 AM   Specimen: Urine, Random  Result Value Ref Range Status   Specimen Description   Final    URINE, RANDOM Performed at [facility], [address], [place], [place] [address]    Special Requests   Final    NONE Performed at [facility], [address] [place], [place] [address]    Culture   Final    NO GROWTH Performed at [facility], [place] [address] [place], [place] [address]    Report Status 10/17/2018 FINAL  Final  Blood culture (routine x 2)     Status: None   Collection Time: 10/16/18  2:26 AM   Specimen: BLOOD  Result Value Ref Range Status   Specimen Description BLOOD LEFT FA  Final   Special Requests   Final    BOTTLES DRAWN AEROBIC AND ANAEROBIC Blood Culture adequate volume   Culture   Final    NO GROWTH 5 DAYS Performed at [facility], [address], [place], [place] [address]    Report Status 10/21/2018 FINAL  Final  Blood culture (routine x 2)     Status: Abnormal   Collection Time: 10/16/18  2:26 AM   Specimen: BLOOD  Result Value Ref Range Status   Specimen Description   Final    BLOOD LEFT FA Performed at [facility], [address], [place], [place] [address]    Special Requests   Final    BOTTLES DRAWN AEROBIC AND ANAEROBIC Blood Culture adequate volume Performed at [facility], [address] [place], [place] [address]    Culture  Setup Time   Final    ANAEROBIC BOTTLE ONLY GRAM NEGATIVE RODS CRITICAL RESULT CALLED TO, READ BACK BY AND VERIFIED WITH: Dustin Folks AT [id number] ON 10/16/2018 BY JPM Performed at [facility], [place] [address] [place], [place] [address]    Culture KLEBSIELLA OXYTOCA (A)  Final   Report Status 10/18/2018 FINAL  Final   Organism ID, Bacteria KLEBSIELLA OXYTOCA  Final      Susceptibility   Klebsiella oxytoca - MIC*    AMPICILLIN >=32 RESISTANT Resistant      CEFAZOLIN <=4 SENSITIVE Sensitive  CEFEPIME <=1 SENSITIVE Sensitive     CEFTAZIDIME <=1 SENSITIVE Sensitive     CEFTRIAXONE <=1 SENSITIVE Sensitive     CIPROFLOXACIN <=0.25 SENSITIVE Sensitive     GENTAMICIN <=1 SENSITIVE Sensitive     IMIPENEM <=0.25 SENSITIVE Sensitive     TRIMETH/SULFA <=20 SENSITIVE Sensitive     AMPICILLIN/SULBACTAM 8 SENSITIVE Sensitive     PIP/TAZO <=4 SENSITIVE Sensitive     * KLEBSIELLA OXYTOCA  SARS Coronavirus 2 [facility] order, Performed in [facility] hospital lab) Nasopharyngeal Nasopharyngeal Swab     Status: None   Collection Time: 10/16/18  2:26 AM   Specimen: Nasopharyngeal Swab  Result Value Ref Range Status   SARS Coronavirus 2 NEGATIVE NEGATIVE Final    Comment: (NOTE) If result is NEGATIVE SARS-CoV-2 target nucleic acids are NOT DETECTED. The SARS-CoV-2 RNA is generally detectable in upper and lower  respiratory specimens during the acute phase of infection. The lowest  concentration of SARS-CoV-2 viral copies this assay can detect is 250  copies / mL. A negative result does not preclude SARS-CoV-2 infection  and should not be used as the sole basis for treatment or other  patient management decisions.  A negative result may occur with  improper specimen collection / handling, submission of specimen other  than nasopharyngeal swab, presence of viral mutation(s) within the  areas targeted by this assay, and inadequate number of viral copies  (<250 copies / mL). A negative result must be combined with clinical  observations, patient history, and epidemiological information. If result is POSITIVE SARS-CoV-2 target nucleic acids are DETECTED. The SARS-CoV-2 RNA is generally detectable in upper and lower  respiratory specimens dur ing the acute phase of infection.  Positive  results are indicative of active infection with SARS-CoV-2.  Clinical  correlation with patient history and other diagnostic information is  necessary to determine patient  infection status.  Positive results do  not rule out bacterial infection or co-infection with other viruses. If result is PRESUMPTIVE POSTIVE SARS-CoV-2 nucleic acids MAY BE PRESENT.   A presumptive positive result was obtained on the submitted specimen  and confirmed on repeat testing.  While 2019 novel coronavirus  (SARS-CoV-2) nucleic acids may be present in the submitted sample  additional confirmatory testing may be necessary for epidemiological  and / or clinical management purposes  to differentiate between  SARS-CoV-2 and other Sarbecovirus currently known to infect humans.  If clinically indicated additional testing with an alternate test  methodology [phone]) is advised. The SARS-CoV-2 RNA is generally  detectable in upper and lower respiratory sp ecimens during the acute  phase of infection. The expected result is Negative. Fact Sheet for Patients:  StrictlyIdeas.no Fact Sheet for Healthcare Providers: BankingDealers.co.za This test is not yet approved or cleared by the [place] FDA and has been authorized for detection and/or diagnosis of SARS-CoV-2 by FDA under an Emergency Use Authorization (EUA).  This EUA will remain in effect (meaning this test can be used) for the duration of the COVID-19 declaration under Section 564(b)(1) of the Act, 21 U.S.C. section 360bbb-3(b)(1), unless the authorization is terminated or revoked sooner. Performed at [facility], [address] [place], [place] [address]   Blood Culture ID Panel (Reflexed)     Status: Abnormal   Collection Time: 10/16/18  2:26 AM  Result Value Ref Range Status   Enterococcus species NOT DETECTED NOT DETECTED Final   Listeria monocytogenes NOT DETECTED NOT DETECTED Final   Staphylococcus species NOT DETECTED NOT DETECTED Final  Staphylococcus aureus (BCID) NOT DETECTED NOT DETECTED Final   Streptococcus species NOT DETECTED NOT DETECTED Final    Streptococcus agalactiae NOT DETECTED NOT DETECTED Final   Streptococcus pneumoniae NOT DETECTED NOT DETECTED Final   Streptococcus pyogenes NOT DETECTED NOT DETECTED Final   Acinetobacter baumannii NOT DETECTED NOT DETECTED Final   Enterobacteriaceae species DETECTED (A) NOT DETECTED Final    Comment: Enterobacteriaceae represent a large family of gram-negative bacteria, not a single organism. CRITICAL RESULT CALLED TO, READ BACK BY AND VERIFIED WITH: ELLINGTON,A AT [id number] ON 10/16/2018 BY JPM    Enterobacter cloacae complex NOT DETECTED NOT DETECTED Final   Escherichia coli NOT DETECTED NOT DETECTED Final   Klebsiella oxytoca DETECTED (A) NOT DETECTED Final    Comment: CRITICAL RESULT CALLED TO, READ BACK BY AND VERIFIED WITH: ELLINGTON,A AT 1747 ON 10/16/2018 BY JPM    Klebsiella pneumoniae NOT DETECTED NOT DETECTED Final   Proteus species NOT DETECTED NOT DETECTED Final   Serratia marcescens NOT DETECTED NOT DETECTED Final   Carbapenem resistance NOT DETECTED NOT DETECTED Final   Haemophilus influenzae NOT DETECTED NOT DETECTED Final   Neisseria meningitidis NOT DETECTED NOT DETECTED Final   Pseudomonas aeruginosa NOT DETECTED NOT DETECTED Final   Candida albicans NOT DETECTED NOT DETECTED Final   Candida glabrata NOT DETECTED NOT DETECTED Final   Candida krusei NOT DETECTED NOT DETECTED Final   Candida parapsilosis NOT DETECTED NOT DETECTED Final   Candida tropicalis NOT DETECTED NOT DETECTED Final    Comment: Performed at [facility], [address] [place], [place] [address]  CULTURE, BLOOD (ROUTINE X 2) w Reflex to ID Panel     Status: None   Collection Time: 10/21/18 12:38 AM   Specimen: BLOOD  Result Value Ref Range Status   Specimen Description BLOOD RIGHT ANTECUBITAL  Final   Special Requests   Final    BOTTLES DRAWN AEROBIC AND ANAEROBIC Blood Culture adequate volume   Culture   Final    NO GROWTH 5 DAYS Performed at [facility], [address] [place], [place] [address]    Report Status 10/26/2018 FINAL  Final  CULTURE, BLOOD (ROUTINE X 2) w Reflex to ID Panel     Status: None   Collection Time: 10/21/18 12:38 AM   Specimen: BLOOD  Result Value Ref Range Status   Specimen Description BLOOD LEFT ANTECUBITAL  Final   Special Requests   Final    BOTTLES DRAWN AEROBIC AND ANAEROBIC Blood Culture adequate volume   Culture   Final    NO GROWTH 5 DAYS Performed at [facility], [address] [place], [place] [address]    Report Status 10/26/2018 FINAL  Final    RADIOLOGY:  No results found.  Follow up with PCP in 1 week.  Management plans discussed with the patient, family and they are in agreement.  CODE STATUS: Full code    Code Status Orders  (From admission, onward)         Start     Ordered   10/16/18 1137  Full code  Continuous     10/16/18 1136        Code Status History    Date Active Date Inactive Code Status Order ID Comments User Context   08/14/2018 0410 08/15/2018 1811 Full Code [id number]  Mayer Camel, NP ED   02/23/2015 2004 03/08/2015 1850 Full Code [id number]  Vaughan Basta, MD Inpatient   Advance Care Planning Activity  TOTAL TIME TAKING CARE OF THIS PATIENT ON DAY OF DISCHARGE: more than 34 minutes.   Saundra Shelling M.D on 10/30/2018 at 1:16 PM  Between 7am to 6pm - Pager - [phone]  After 6pm go to [email or web address] - password [email or web address] [place] [facility]  Office  [phone]  CC: Primary care physician; Idelle Crouch, MD  Note: This dictation was prepared with Dragon dictation along with smaller phrase technology. Any transcriptional errors that result from this process are unintentional.

## 2018-10-30 NOTE — Consult Note (Addendum)
[place] for Warfarin Dosing and Monitoring Indication: VTE Treatment   Patient Measurements: Height: 5\' 3"  (160 cm) Weight: 128 lb (58.1 kg) IBW/kg (Calculated) : 56.9  Vital Signs: Temp: 98.3 F (36.8 C) (09/22 0000) Temp Source: Oral (09/22 0000) BP: 144/68 (09/22 0000) Pulse Rate: 82 (09/22 0000)  Labs: Recent Labs    10/28/18 0412  10/28/18 2301 10/29/18 0458 10/30/18 0427  HGB 10.5*  --   --   --  9.9*  HCT 34.2*  --   --   --  32.3*  PLT 166  --   --   --  147*  LABPROT 19.6*  --   --  18.8* 18.8*  INR 1.7*  --   --  1.6* 1.6*  HEPARINUNFRC 0.71*   < > 0.60 0.32 0.49  CREATININE 2.71*  --   --   --  3.00*   < > = values in this interval not displayed.    Estimated Creatinine Clearance: 18.4 mL/min (A) (by C-G formula based on SCr of 3 mg/dL (H)).   Medical History: Past Medical History:  Diagnosis Date  . Anemia   . Antiphospholipid antibody syndrome ([place])   . Arthritis   . DVT (deep venous thrombosis) ([place])   . Essential hypertension   . Gastric ulcer   . GERD (gastroesophageal reflux disease)   . History of CMV   . Hyperlipidemia   . Hyperparathyroidism ([place])   . Pedal edema   . Peripheral vascular disease ([place])   . Rosacea   . Status post kidney transplant 2001  . Stroke ([place])   . TIA (transient ischemic attack)   . Vascular dementia without behavioral disturbance [facility])    Assessment: Pharmacy consulted for warfarin dosing and monitoring in 68  yo male with PMH of DVT. Patient has been admitted with UTI and Acute Renal Failure on CKD w/ PMH of kidney transplant. INR therapeutic on admission. Per wife, prefer to keep pt between INR 2.5 and 3.0 d/t APS and PMH of strokes. H&H stable, PLT trending up.  Home Regimen: Warfarin 3mg : Mon, Wed, Fri                              Warfarin 2mg : [place], Sat, Sun   INR Warfarin dose  given 9/8 2.4 2mg  9/9 2.5 3mg  9/10 2.5 2mg  9/11 2.6 3mg   9/12 3.1 2mg  9/13 3.6 HELD 9/14 4.8 HELD 9/15 4.0 HELD 9/16 2.8       1 mg 9/17     2.1       2 mg 9/18     1.7       2 mg 9/19     1.9       2.5 mg 9/20     1.7        3 mg 9/21     1.6  5 mg  9/22 1.6  Today's INR is subtherapeutic.   Vitamin K Antagonists (eg, warfarin): Cephalosporins may enhance the anticoagulant effect of Vitamin K Antagonists. Risk C: Monitor therapy - likely unexpected bump in INR due to this interaction   Goal of Therapy:  INR 2.5-3.0 Monitor platelets by anticoagulation protocol: Yes   Plan:   Per MD, heparin will be added to bridge until INR is therapeutic >2.  Will give Warfarin 5mg  and obtain INR with am labs  INRs daily per protocol while receiving antibiotic  CBC at  least every 3 days per protocol.  Addendum: Per MD, patient will likely go home today. Will order 7 mg to be given prior to discharge and to resume PTA warfarin regimen tomorrow. Discussed with MD that patient may benefit from 1-2 days of treatment Lovenox.    Pharmacy will continue to follow and order dose based on levels.   Kristeen Miss, PharmD Clinical Pharmacist 10/30/2018 7:15 AM

## 2018-10-30 NOTE — Progress Notes (Signed)
Discharge summary reviewed with verbal understanding from spouse Rodena Piety. Answered all questions escorted to personal vehicle

## 2018-10-30 NOTE — Consult Note (Addendum)
ANTICOAGULATION CONSULT NOTE   Pharmacy Consult for Heparin Indication: VTE treatment  Patient Measurements: Height: 5\' 3"  (160 cm) Weight: 128 lb (58.1 kg) IBW/kg (Calculated) : 56.9 Heparin Dosing Weight: TBW  Vital Signs: Temp: 98.3 F (36.8 C) (09/22 0000) Temp Source: Oral (09/22 0000) BP: 144/68 (09/22 0000) Pulse Rate: 82 (09/22 0000)  Labs: Recent Labs    10/28/18 0412  10/28/18 2301 10/29/18 0458 10/30/18 0427  HGB 10.5*  --   --   --  9.9*  HCT 34.2*  --   --   --  32.3*  PLT 166  --   --   --  147*  LABPROT 19.6*  --   --  18.8* 18.8*  INR 1.7*  --   --  1.6* 1.6*  HEPARINUNFRC 0.71*   < > 0.60 0.32 0.49  CREATININE 2.71*  --   --   --  3.00*   < > = values in this interval not displayed.    Estimated Creatinine Clearance: 18.4 mL/min (A) (by C-G formula based on SCr of 3 mg/dL (H)).  Medications:  Coumadin  Pharmacy consulted for heparin dosing and monitoring in 68  yo male with PMH of DVT. Patient has been admitted with UTI/bacteremia and Acute Renal Failure on CKD w/ PMH of kidney transplant.  [name] also on warfarin outpatient.  Per wife, prefer to keep pt between INR 2.5 and 3.0 d/t APS and PMH of strokes.   Assessment: INR is currently subtherapeutic at 1.7, and pharmacy has been consulted to bridge heparin (no bolus) until INR therapeutic > 2.    9/18 @ 2213 HL = 0.37, therapeutic x 1 9/19 @ 0555 HL = 0.56, therapeutic x 2 (INR = 1.9), CBC appears stable. 9/20 @ 0412 HL = 0.71, slightly supratherapeutic (INR = 1.7), CBC appears stable 9/20 @ 1417 HL = 0.75.   9/20 @ 2301 HL = 0.60, therapeutic x 1 9/21 @ 0458 HL = 0.32, therapeutic x 2 (INR = 1.6) 9/22 @ 0427 HL = 0.49, therapeutic x3. Slight decrease in plt count and Hgb today.   Goal of Therapy:   Heparin level goal 0.3-0.7   Plan:   Continue heparin continuous infusion at 850 units/hour, recheck HL in am to confirm   Heparin level and INR daily w/ routine labs  Will follow-up  with MD and recheck CBC with AM labs tomorrow. Addendum: Confirmed with nurse, no concerns for active bleeding at this time.   Will order HL daily and CBC per protocol, and monitor platelets by anticoagulation protocol.  Rowland Lathe, [facility] 10/30/2018 7:20 AM

## 2018-10-30 NOTE — Progress Notes (Signed)
[facility]  ROUNDING NOTE   Subjective:  Creatinine slightly higher today at 3.0. Resting comfortably in bed at the moment. Encouraged increased fluid intake today.   Objective:  Vital signs in last 24 hours:  Temp:  [98.2 F (36.8 C)-98.3 F (36.8 C)] 98.2 F (36.8 C) (09/22 0729) Pulse Rate:  [69-82] 76 (09/22 0729) Resp:  [19] 19 (09/22 0000) BP: (134-157)/(68-75) 157/70 (09/22 0729) SpO2:  [95 %-98 %] 95 % (09/22 0729)  Weight change:  Filed Weights   10/15/18 2309  Weight: 58.1 kg    Intake/Output: I/O last 3 completed shifts: In: 1756.9 [P.O.:1080; I.V.:465; IV Piggyback:211.9] Out: 1151 [Urine:1150; Stool:1]   Intake/Output this shift:  Total I/O In: -  Out: 200 [Urine:200]  Physical Exam: General: NAD  Head: Normocephalic, atraumatic. Moist oral mucosal membranes  Eyes: Anicteric  Neck: Supple  Lungs:  Clear bilateral, normal effort  Heart: Regular rate and rhythm  Abdomen:  Soft, nontender, BS present  Extremities: no peripheral edema.  Neurologic: Nonfocal, moving all four extremities  Skin: Scattered ecchymoses    Basic Metabolic Panel: Recent Labs  Lab 10/24/18 0427 10/25/18 0411 10/26/18 0419 10/27/18 0555 10/28/18 0412 10/30/18 0427  NA 143 144 142 143 140 141  K 3.2* 3.9 4.0 4.2 4.3 4.2  CL 102 104 106 110 106 112*  CO2 29 28 28 27 25 24   GLUCOSE 121* 102* 84 91 111* 97  BUN 60* 63* 55* 47* 40* 37*  CREATININE 3.85* 3.69* 3.25* 2.90* 2.71* 3.00*  CALCIUM 9.1 9.3 9.2 9.0 9.0 9.1  MG 1.8 1.9  --  2.0 2.1  --     Liver Function Tests: No results for input(s): AST, ALT, ALKPHOS, BILITOT, PROT, ALBUMIN in the last 168 hours. No results for input(s): LIPASE, AMYLASE in the last 168 hours. No results for input(s): AMMONIA in the last 168 hours.  CBC: Recent Labs  Lab 10/26/18 0419 10/27/18 0555 10/28/18 0412 10/30/18 0427  WBC 6.3 7.4 9.2 8.9  HGB 10.5* 10.3* 10.5* 9.9*  HCT 34.4* 33.7* 34.2* 32.3*  MCV 92.2  91.6 92.2 93.6  PLT 179 169 166 147*    Cardiac Enzymes: No results for input(s): CKTOTAL, CKMB, CKMBINDEX, TROPONINI in the last 168 hours.  BNP: Invalid input(s): POCBNP  CBG: No results for input(s): GLUCAP in the last 168 hours.  Microbiology: Results for orders placed or performed during the hospital encounter of 10/16/18  Urine culture     Status: None   Collection Time: 10/16/18  2:26 AM   Specimen: Urine, Random  Result Value Ref Range Status   Specimen Description   Final    URINE, RANDOM Performed at [facility], [address] [place], [place] [address]    Special Requests   Final    NONE Performed at [facility], [address], [place], [place] [address]    Culture   Final    NO GROWTH Performed at [facility], [place] [address], [place],  [address]    Report Status 10/17/2018 FINAL  Final  Blood culture (routine x 2)     Status: None   Collection Time: 10/16/18  2:26 AM   Specimen: BLOOD  Result Value Ref Range Status   Specimen Description BLOOD LEFT FA  Final   Special Requests   Final    BOTTLES DRAWN AEROBIC AND ANAEROBIC Blood Culture adequate volume   Culture   Final    NO GROWTH 5 DAYS Performed at  [facility], [address], [place], [place] [address]    Report Status 10/21/2018 FINAL  Final  Blood culture (routine x 2)     Status: Abnormal   Collection Time: 10/16/18  2:26 AM   Specimen: BLOOD  Result Value Ref Range Status   Specimen Description   Final    BLOOD LEFT FA Performed at [facility], [address], [place], [place] [address]    Special Requests   Final    BOTTLES DRAWN AEROBIC AND ANAEROBIC Blood Culture adequate volume Performed at [facility], [address] [place], [place] [address]    Culture  Setup Time   Final    ANAEROBIC BOTTLE ONLY GRAM NEGATIVE RODS CRITICAL RESULT CALLED TO, READ BACK BY AND VERIFIED WITH: Dustin Folks AT [id number] ON 10/16/2018 BY  JPM Performed at [facility], [place] [address], [place], [place] [address]    Culture KLEBSIELLA OXYTOCA (A)  Final   Report Status 10/18/2018 FINAL  Final   Organism ID, Bacteria KLEBSIELLA OXYTOCA  Final      Susceptibility   Klebsiella oxytoca - MIC*    AMPICILLIN >=32 RESISTANT Resistant     CEFAZOLIN <=4 SENSITIVE Sensitive     CEFEPIME <=1 SENSITIVE Sensitive     CEFTAZIDIME <=1 SENSITIVE Sensitive     CEFTRIAXONE <=1 SENSITIVE Sensitive     CIPROFLOXACIN <=0.25 SENSITIVE Sensitive     GENTAMICIN <=1 SENSITIVE Sensitive     IMIPENEM <=0.25 SENSITIVE Sensitive     TRIMETH/SULFA <=20 SENSITIVE Sensitive     AMPICILLIN/SULBACTAM 8 SENSITIVE Sensitive     PIP/TAZO <=4 SENSITIVE Sensitive     * KLEBSIELLA OXYTOCA  SARS Coronavirus 2 [facility] order, Performed in [facility] hospital lab) Nasopharyngeal Nasopharyngeal Swab     Status: None   Collection Time: 10/16/18  2:26 AM   Specimen: Nasopharyngeal Swab  Result Value Ref Range Status   SARS Coronavirus 2 NEGATIVE NEGATIVE Final    Comment: (NOTE) If result is NEGATIVE SARS-CoV-2 target nucleic acids are NOT DETECTED. The SARS-CoV-2 RNA is generally detectable in upper and lower  respiratory specimens during the acute phase of infection. The lowest  concentration of SARS-CoV-2 viral copies this assay can detect is 250  copies / mL. A negative result does not preclude SARS-CoV-2 infection  and should not be used as the sole basis for treatment or other  patient management decisions.  A negative result may occur with  improper specimen collection / handling, submission of specimen other  than nasopharyngeal swab, presence of viral mutation(s) within the  areas targeted by this assay, and inadequate number of viral copies  (<250 copies / mL). A negative result must be combined with clinical  observations, patient history, and epidemiological information. If result is POSITIVE SARS-CoV-2 target nucleic acids are  DETECTED. The SARS-CoV-2 RNA is generally detectable in upper and lower  respiratory specimens dur ing the acute phase of infection.  Positive  results are indicative of active infection with SARS-CoV-2.  Clinical  correlation with patient history and other diagnostic information is  necessary to determine patient infection status.  Positive results do  not rule out bacterial infection or co-infection with other viruses. If result is PRESUMPTIVE POSTIVE SARS-CoV-2 nucleic acids MAY BE PRESENT.   A presumptive positive result was obtained on the submitted specimen  and confirmed on repeat testing.  While 2019 novel coronavirus  (SARS-CoV-2) nucleic acids may be present in the submitted sample  additional confirmatory testing may  be necessary for epidemiological  and / or clinical management purposes  to differentiate between  SARS-CoV-2 and other Sarbecovirus currently known to infect humans.  If clinically indicated additional testing with an alternate test  methodology [phone]) is advised. The SARS-CoV-2 RNA is generally  detectable in upper and lower respiratory sp ecimens during the acute  phase of infection. The expected result is Negative. Fact Sheet for Patients:  StrictlyIdeas.no Fact Sheet for Healthcare Providers: BankingDealers.co.za This test is not yet approved or cleared by the [place] FDA and has been authorized for detection and/or diagnosis of SARS-CoV-2 by FDA under an Emergency Use Authorization (EUA).  This EUA will remain in effect (meaning this test can be used) for the duration of the COVID-19 declaration under Section 564(b)(1) of the Act, 21 U.S.C. section 360bbb-3(b)(1), unless the authorization is terminated or revoked sooner. Performed at [facility], [address] [place], [place] [address]   Blood Culture ID Panel (Reflexed)     Status: Abnormal   Collection Time: 10/16/18  2:26 AM   Result Value Ref Range Status   Enterococcus species NOT DETECTED NOT DETECTED Final   Listeria monocytogenes NOT DETECTED NOT DETECTED Final   Staphylococcus species NOT DETECTED NOT DETECTED Final   Staphylococcus aureus (BCID) NOT DETECTED NOT DETECTED Final   Streptococcus species NOT DETECTED NOT DETECTED Final   Streptococcus agalactiae NOT DETECTED NOT DETECTED Final   Streptococcus pneumoniae NOT DETECTED NOT DETECTED Final   Streptococcus pyogenes NOT DETECTED NOT DETECTED Final   Acinetobacter baumannii NOT DETECTED NOT DETECTED Final   Enterobacteriaceae species DETECTED (A) NOT DETECTED Final    Comment: Enterobacteriaceae represent a large family of gram-negative bacteria, not a single organism. CRITICAL RESULT CALLED TO, READ BACK BY AND VERIFIED WITH: ELLINGTON,A AT [id number] ON 10/16/2018 BY JPM    Enterobacter cloacae complex NOT DETECTED NOT DETECTED Final   Escherichia coli NOT DETECTED NOT DETECTED Final   Klebsiella oxytoca DETECTED (A) NOT DETECTED Final    Comment: CRITICAL RESULT CALLED TO, READ BACK BY AND VERIFIED WITH: ELLINGTON,A AT 1747 ON 10/16/2018 BY JPM    Klebsiella pneumoniae NOT DETECTED NOT DETECTED Final   Proteus species NOT DETECTED NOT DETECTED Final   Serratia marcescens NOT DETECTED NOT DETECTED Final   Carbapenem resistance NOT DETECTED NOT DETECTED Final   Haemophilus influenzae NOT DETECTED NOT DETECTED Final   Neisseria meningitidis NOT DETECTED NOT DETECTED Final   Pseudomonas aeruginosa NOT DETECTED NOT DETECTED Final   Candida albicans NOT DETECTED NOT DETECTED Final   Candida glabrata NOT DETECTED NOT DETECTED Final   Candida krusei NOT DETECTED NOT DETECTED Final   Candida parapsilosis NOT DETECTED NOT DETECTED Final   Candida tropicalis NOT DETECTED NOT DETECTED Final    Comment: Performed at [facility], [address] [place], [place] [address]  CULTURE, BLOOD (ROUTINE X 2) w Reflex to ID Panel     Status: None    Collection Time: 10/21/18 12:38 AM   Specimen: BLOOD  Result Value Ref Range Status   Specimen Description BLOOD RIGHT ANTECUBITAL  Final   Special Requests   Final    BOTTLES DRAWN AEROBIC AND ANAEROBIC Blood Culture adequate volume   Culture   Final    NO GROWTH 5 DAYS Performed at [facility], [address] [place], [place] [address]    Report Status 10/26/2018 FINAL  Final  CULTURE, BLOOD (ROUTINE X 2) w Reflex to ID Panel     Status: None  Collection Time: 10/21/18 12:38 AM   Specimen: BLOOD  Result Value Ref Range Status   Specimen Description BLOOD LEFT ANTECUBITAL  Final   Special Requests   Final    BOTTLES DRAWN AEROBIC AND ANAEROBIC Blood Culture adequate volume   Culture   Final    NO GROWTH 5 DAYS Performed at [facility], [address] [place], [place] [address]    Report Status 10/26/2018 FINAL  Final    Coagulation Studies: Recent Labs    10/28/18 0412 10/29/18 0458 10/30/18 0427  LABPROT 19.6* 18.8* 18.8*  INR 1.7* 1.6* 1.6*    Urinalysis: No results for input(s): COLORURINE, LABSPEC, PHURINE, GLUCOSEU, HGBUR, BILIRUBINUR, KETONESUR, PROTEINUR, UROBILINOGEN, NITRITE, LEUKOCYTESUR in the last 72 hours.  Invalid input(s): APPERANCEUR    Imaging: No results found.   Medications:   . sodium chloride 250 mL (10/29/18 0941)   . amLODipine  10 mg Oral Daily  . buPROPion  300 mg Oral Daily  . calcitRIOL  0.25 mcg Oral QHS  . citalopram  20 mg Oral Daily  . dicyclomine  10 mg Oral TID AC  . donepezil  10 mg Oral QHS  . labetalol  200 mg Oral BID  . magnesium oxide  400 mg Oral BID  . mometasone-formoterol  2 puff Inhalation BID  . mycophenolate  250 mg Oral q morning - 10a   And  . mycophenolate  500 mg Oral QHS  . pantoprazole  40 mg Oral Daily  . predniSONE  5 mg Oral Q breakfast  . tacrolimus  0.5 mg Oral BID  . Warfarin - Pharmacist Dosing Inpatient   Does not apply q1800   sodium chloride, acetaminophen **OR**  acetaminophen, albuterol, bisacodyl, diphenoxylate-atropine, hydrALAZINE, HYDROcodone-acetaminophen, ondansetron **OR** ondansetron (ZOFRAN) IV  Assessment/ Plan:  [name] is a 68 y.o. 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 Acute renal failure with metabolic acidosis on Chronic kidney disease stage IVT Status post renal transplantation in 2001.  Serum creatinine is close to baseline now Acute renal failure seems secondary to prerenal azotemia and current infection.  Followed by Dr. Alger Simons, [facility]. -Creatinine was a bit higher today.  Encouraged the patient to increase fluid intake.  Otherwise maintain the patient on current doses of tacrolimus, prednisone, and mycophenolate.   2. Urinary tract infection: Klebsiella oxytoca.   -Patient has been treated with ceftriaxone.  3. Anemia of chronic kidney disease: Lab Results  Component Value Date   HGB 9.9 (L) 10/30/2018  -Hold off on Epogen at this time.     LOS: 14 Delise Simenson 9/22/202011:20 AM

## 2018-10-30 NOTE — Consult Note (Signed)
[place] for Electrolyte Monitoring and Replacement   Recent Labs: Potassium (mmol/L)  Date Value  10/30/2018 4.2   Magnesium (mg/dL)  Date Value  10/28/2018 2.1   Calcium (mg/dL)  Date Value  10/30/2018 9.1   Albumin (g/dL)  Date Value  10/22/2018 3.4 (L)   Phosphorus (mg/dL)  Date Value  10/23/2018 3.0   Sodium (mmol/L)  Date Value  10/30/2018 141    Assessment -K 4.2  Goal WNL. Currently therapeutic -Mg 2.1.  Goal WNL.  Currently therapeutic.    Patient home regimen is Furosemide 40mg  qd with a total of 33meq KCl daily replenishment, as well as magnesium oxide 400 mg BID.  Plan No replenishment at this time.  Will follow up on AM labs.  Will replace to maintain electrolytes within normal limits.   Kristeen Miss, PharmD Clinical Pharmacist 10/30/2018 7:09 AM

## 2018-10-30 NOTE — TOC Transition Note (Signed)
Transition of Care [facility]) - CM/SW Discharge Note   Patient Details  Name: Eric Gill MRN: [id number] Date of Birth: April 26, 1950  [facility] CM/SW Contact:  Walter Grima, Lenice Llamas Phone Number: [phone]  10/30/2018, 11:49 AM   Clinical Narrative: Per RN patient will D/C home today after last dose of IV ABX. Per RN patient will not D/C home on IV ABX because he completed course of IV ABX at [facility]. Clinical Education officer, museum (CSW) notified [place] agency representative that patient will D/C home today. Rolling walker and bedside commode have been delivered to patient's room. RN aware of above. Please reconsult if future social work needs arise. CSW signing off.     Final next level of care: [place] Barriers to Discharge: Barriers Resolved   Patient Goals and CMS Choice Patient states their goals for this hospitalization and ongoing recovery are:: To go home.      Discharge Placement                       Discharge Plan and Services In-house Referral: Clinical Social Work              DME Arranged: Bedside commode, Walker rolling DME Agency: [employer] Date DME Agency Contacted: 10/24/18 Time DME Agency Contacted: [phone] Representative spoke with at DME Agency: [place]: RN, PT, OT, Nurse's Aide, Social Work [employer] Agency: [place] ([place]) Date [place]: 10/30/18 Time [place]: 1149 Representative spoke with at [place]: [place] ([place]) Interventions     Readmission Risk Interventions No flowsheet data found.

## 2019-02-11 ENCOUNTER — Other Ambulatory Visit (HOSPITAL_COMMUNITY): Payer: Self-pay | Admitting: Sports Medicine

## 2019-02-11 ENCOUNTER — Other Ambulatory Visit: Payer: Self-pay

## 2019-02-11 ENCOUNTER — Ambulatory Visit (HOSPITAL_COMMUNITY)
Admission: RE | Admit: 2019-02-11 | Discharge: 2019-02-11 | Disposition: A | Discharge status: Home / Self Care | Payer: Medicare Other | Source: Ambulatory Visit | Attending: Sports Medicine | Admitting: Sports Medicine

## 2019-02-11 DIAGNOSIS — M7989 Other specified soft tissue disorders: Secondary | ICD-10-CM | POA: Diagnosis present

## 2019-02-11 DIAGNOSIS — M79605 Pain in left leg: Secondary | ICD-10-CM | POA: Diagnosis not present

## 2019-02-11 NOTE — Progress Notes (Signed)
Left lower extremity venous duplex has been completed. Preliminary results can be found in CV Proc through chart review.  Results were given to Dr. Alfonso Ramus.  02/11/19 11:05 AM Eric Gill RVT

## 2019-02-24 ENCOUNTER — Other Ambulatory Visit: Payer: Self-pay

## 2019-02-24 ENCOUNTER — Inpatient Hospital Stay
Admission: EM | Admit: 2019-02-24 | Discharge: 2019-02-28 | DRG: 193 | Disposition: A | Discharge status: Home Health | Payer: Medicare Other | Attending: Hospitalist | Admitting: Hospitalist

## 2019-02-24 ENCOUNTER — Emergency Department: Payer: Medicare Other

## 2019-02-24 ENCOUNTER — Inpatient Hospital Stay: Payer: Medicare Other

## 2019-02-24 ENCOUNTER — Encounter: Payer: Self-pay | Admitting: Emergency Medicine

## 2019-02-24 DIAGNOSIS — R0902 Hypoxemia: Secondary | ICD-10-CM

## 2019-02-24 DIAGNOSIS — Y830 Surgical operation with transplant of whole organ as the cause of abnormal reaction of the patient, or of later complication, without mention of misadventure at the time of the procedure: Secondary | ICD-10-CM | POA: Diagnosis present

## 2019-02-24 DIAGNOSIS — T508X5A Adverse effect of diagnostic agents, initial encounter: Secondary | ICD-10-CM | POA: Diagnosis present

## 2019-02-24 DIAGNOSIS — Z87891 Personal history of nicotine dependence: Secondary | ICD-10-CM

## 2019-02-24 DIAGNOSIS — X58XXXA Exposure to other specified factors, initial encounter: Secondary | ICD-10-CM | POA: Diagnosis present

## 2019-02-24 DIAGNOSIS — N184 Chronic kidney disease, stage 4 (severe): Secondary | ICD-10-CM | POA: Diagnosis present

## 2019-02-24 DIAGNOSIS — Z7951 Long term (current) use of inhaled steroids: Secondary | ICD-10-CM

## 2019-02-24 DIAGNOSIS — R0600 Dyspnea, unspecified: Secondary | ICD-10-CM

## 2019-02-24 DIAGNOSIS — F05 Delirium due to known physiological condition: Secondary | ICD-10-CM | POA: Diagnosis present

## 2019-02-24 DIAGNOSIS — I129 Hypertensive chronic kidney disease with stage 1 through stage 4 chronic kidney disease, or unspecified chronic kidney disease: Secondary | ICD-10-CM | POA: Diagnosis present

## 2019-02-24 DIAGNOSIS — Z888 Allergy status to other drugs, medicaments and biological substances status: Secondary | ICD-10-CM

## 2019-02-24 DIAGNOSIS — Z20822 Contact with and (suspected) exposure to covid-19: Secondary | ICD-10-CM | POA: Diagnosis present

## 2019-02-24 DIAGNOSIS — R0602 Shortness of breath: Secondary | ICD-10-CM

## 2019-02-24 DIAGNOSIS — J189 Pneumonia, unspecified organism: Secondary | ICD-10-CM | POA: Diagnosis present

## 2019-02-24 DIAGNOSIS — F329 Major depressive disorder, single episode, unspecified: Secondary | ICD-10-CM | POA: Diagnosis present

## 2019-02-24 DIAGNOSIS — Z9049 Acquired absence of other specified parts of digestive tract: Secondary | ICD-10-CM

## 2019-02-24 DIAGNOSIS — T8619 Other complication of kidney transplant: Secondary | ICD-10-CM | POA: Diagnosis present

## 2019-02-24 DIAGNOSIS — Z8711 Personal history of peptic ulcer disease: Secondary | ICD-10-CM

## 2019-02-24 DIAGNOSIS — E213 Hyperparathyroidism, unspecified: Secondary | ICD-10-CM | POA: Diagnosis present

## 2019-02-24 DIAGNOSIS — A419 Sepsis, unspecified organism: Secondary | ICD-10-CM | POA: Diagnosis not present

## 2019-02-24 DIAGNOSIS — Z86718 Personal history of other venous thrombosis and embolism: Secondary | ICD-10-CM | POA: Diagnosis not present

## 2019-02-24 DIAGNOSIS — Z7901 Long term (current) use of anticoagulants: Secondary | ICD-10-CM | POA: Diagnosis not present

## 2019-02-24 DIAGNOSIS — J9601 Acute respiratory failure with hypoxia: Secondary | ICD-10-CM | POA: Diagnosis present

## 2019-02-24 DIAGNOSIS — F0150 Vascular dementia without behavioral disturbance: Secondary | ICD-10-CM | POA: Diagnosis present

## 2019-02-24 DIAGNOSIS — I739 Peripheral vascular disease, unspecified: Secondary | ICD-10-CM | POA: Diagnosis present

## 2019-02-24 DIAGNOSIS — N189 Chronic kidney disease, unspecified: Secondary | ICD-10-CM | POA: Diagnosis not present

## 2019-02-24 DIAGNOSIS — Z8673 Personal history of transient ischemic attack (TIA), and cerebral infarction without residual deficits: Secondary | ICD-10-CM

## 2019-02-24 DIAGNOSIS — I509 Heart failure, unspecified: Secondary | ICD-10-CM

## 2019-02-24 DIAGNOSIS — J440 Chronic obstructive pulmonary disease with acute lower respiratory infection: Secondary | ICD-10-CM | POA: Diagnosis present

## 2019-02-24 DIAGNOSIS — N179 Acute kidney failure, unspecified: Secondary | ICD-10-CM | POA: Diagnosis present

## 2019-02-24 DIAGNOSIS — M1990 Unspecified osteoarthritis, unspecified site: Secondary | ICD-10-CM | POA: Diagnosis present

## 2019-02-24 DIAGNOSIS — D849 Immunodeficiency, unspecified: Secondary | ICD-10-CM | POA: Diagnosis present

## 2019-02-24 DIAGNOSIS — K219 Gastro-esophageal reflux disease without esophagitis: Secondary | ICD-10-CM | POA: Diagnosis present

## 2019-02-24 DIAGNOSIS — Z79899 Other long term (current) drug therapy: Secondary | ICD-10-CM

## 2019-02-24 DIAGNOSIS — E785 Hyperlipidemia, unspecified: Secondary | ICD-10-CM | POA: Diagnosis present

## 2019-02-24 DIAGNOSIS — D631 Anemia in chronic kidney disease: Secondary | ICD-10-CM | POA: Diagnosis present

## 2019-02-24 DIAGNOSIS — D6861 Antiphospholipid syndrome: Secondary | ICD-10-CM | POA: Diagnosis present

## 2019-02-24 LAB — RESPIRATORY PANEL BY RT PCR (FLU A&B, COVID)
Influenza A by PCR: NEGATIVE
Influenza B by PCR: NEGATIVE
SARS Coronavirus 2 by RT PCR: NEGATIVE

## 2019-02-24 LAB — CBC WITH DIFFERENTIAL/PLATELET
Abs Immature Granulocytes: 0.05 10*3/uL (ref 0.00–0.07)
Abs Immature Granulocytes: 0.06 10*3/uL (ref 0.00–0.07)
Basophils Absolute: 0 10*3/uL (ref 0.0–0.1)
Basophils Absolute: 0 10*3/uL (ref 0.0–0.1)
Basophils Relative: 0 %
Basophils Relative: 0 %
Eosinophils Absolute: 0 10*3/uL (ref 0.0–0.5)
Eosinophils Absolute: 0.1 10*3/uL (ref 0.0–0.5)
Eosinophils Relative: 0 %
Eosinophils Relative: 1 %
HCT: 26.5 % — ABNORMAL LOW (ref 39.0–52.0)
HCT: 31.8 % — ABNORMAL LOW (ref 39.0–52.0)
Hemoglobin: 8.1 g/dL — ABNORMAL LOW (ref 13.0–17.0)
Hemoglobin: 9.8 g/dL — ABNORMAL LOW (ref 13.0–17.0)
Immature Granulocytes: 1 %
Immature Granulocytes: 1 %
Lymphocytes Relative: 7 %
Lymphocytes Relative: 8 %
Lymphs Abs: 0.7 10*3/uL (ref 0.7–4.0)
Lymphs Abs: 0.7 10*3/uL (ref 0.7–4.0)
MCH: 27.8 pg (ref 26.0–34.0)
MCH: 28.1 pg (ref 26.0–34.0)
MCHC: 30.6 g/dL (ref 30.0–36.0)
MCHC: 30.8 g/dL (ref 30.0–36.0)
MCV: 91.1 fL (ref 80.0–100.0)
MCV: 91.1 fL (ref 80.0–100.0)
Monocytes Absolute: 0.9 10*3/uL (ref 0.1–1.0)
Monocytes Absolute: 1.1 10*3/uL — ABNORMAL HIGH (ref 0.1–1.0)
Monocytes Relative: 10 %
Monocytes Relative: 12 %
Neutro Abs: 7 10*3/uL (ref 1.7–7.7)
Neutro Abs: 7.3 10*3/uL (ref 1.7–7.7)
Neutrophils Relative %: 80 %
Neutrophils Relative %: 80 %
Platelets: 113 10*3/uL — ABNORMAL LOW (ref 150–400)
Platelets: 141 10*3/uL — ABNORMAL LOW (ref 150–400)
RBC: 2.91 MIL/uL — ABNORMAL LOW (ref 4.22–5.81)
RBC: 3.49 MIL/uL — ABNORMAL LOW (ref 4.22–5.81)
RDW: 14 % (ref 11.5–15.5)
RDW: 14.2 % (ref 11.5–15.5)
WBC: 8.7 10*3/uL (ref 4.0–10.5)
WBC: 9.2 10*3/uL (ref 4.0–10.5)
nRBC: 0 % (ref 0.0–0.2)
nRBC: 0 % (ref 0.0–0.2)

## 2019-02-24 LAB — COMPREHENSIVE METABOLIC PANEL
ALT: 13 U/L (ref 0–44)
AST: 19 U/L (ref 15–41)
Albumin: 3.8 g/dL (ref 3.5–5.0)
Alkaline Phosphatase: 100 U/L (ref 38–126)
Anion gap: 8 (ref 5–15)
BUN: 45 mg/dL — ABNORMAL HIGH (ref 8–23)
CO2: 23 mmol/L (ref 22–32)
Calcium: 9.2 mg/dL (ref 8.9–10.3)
Chloride: 106 mmol/L (ref 98–111)
Creatinine, Ser: 4.07 mg/dL — ABNORMAL HIGH (ref 0.61–1.24)
GFR calc Af Amer: 16 mL/min — ABNORMAL LOW (ref >60)
GFR calc non Af Amer: 14 mL/min — ABNORMAL LOW (ref >60)
Glucose, Bld: 101 mg/dL — ABNORMAL HIGH (ref 70–99)
Potassium: 4.5 mmol/L (ref 3.5–5.1)
Sodium: 137 mmol/L (ref 135–145)
Total Bilirubin: 1.2 mg/dL (ref 0.3–1.2)
Total Protein: 7.1 g/dL (ref 6.5–8.1)

## 2019-02-24 LAB — BASIC METABOLIC PANEL
Anion gap: 5 (ref 5–15)
BUN: 45 mg/dL — ABNORMAL HIGH (ref 8–23)
CO2: 23 mmol/L (ref 22–32)
Calcium: 8.2 mg/dL — ABNORMAL LOW (ref 8.9–10.3)
Chloride: 108 mmol/L (ref 98–111)
Creatinine, Ser: 3.66 mg/dL — ABNORMAL HIGH (ref 0.61–1.24)
GFR calc Af Amer: 18 mL/min — ABNORMAL LOW (ref >60)
GFR calc non Af Amer: 16 mL/min — ABNORMAL LOW (ref >60)
Glucose, Bld: 94 mg/dL (ref 70–99)
Potassium: 4.3 mmol/L (ref 3.5–5.1)
Sodium: 136 mmol/L (ref 135–145)

## 2019-02-24 LAB — URINALYSIS, ROUTINE W REFLEX MICROSCOPIC
Bilirubin Urine: NEGATIVE
Glucose, UA: NEGATIVE mg/dL
Ketones, ur: NEGATIVE mg/dL
Leukocytes,Ua: NEGATIVE
Nitrite: NEGATIVE
Protein, ur: 100 mg/dL — AB
Specific Gravity, Urine: 1.013 (ref 1.005–1.030)
pH: 6 (ref 5.0–8.0)

## 2019-02-24 LAB — FERRITIN: Ferritin: 303 ng/mL (ref 24–336)

## 2019-02-24 LAB — NA AND K (SODIUM & POTASSIUM), RAND UR
Potassium Urine: 21 mmol/L
Sodium, Ur: 111 mmol/L

## 2019-02-24 LAB — HEPARIN LEVEL (UNFRACTIONATED): Heparin Unfractionated: 0.6 IU/mL (ref 0.30–0.70)

## 2019-02-24 LAB — OSMOLALITY, URINE: Osmolality, Ur: 333 mOsm/kg (ref 300–900)

## 2019-02-24 LAB — HIV ANTIBODY (ROUTINE TESTING W REFLEX): HIV Screen 4th Generation wRfx: NONREACTIVE

## 2019-02-24 LAB — C-REACTIVE PROTEIN: CRP: 12.3 mg/dL — ABNORMAL HIGH (ref <1.0)

## 2019-02-24 LAB — BRAIN NATRIURETIC PEPTIDE: B Natriuretic Peptide: 2016 pg/mL — ABNORMAL HIGH (ref 0.0–100.0)

## 2019-02-24 LAB — PROTIME-INR
INR: 1.4 — ABNORMAL HIGH (ref 0.8–1.2)
Prothrombin Time: 17.5 seconds — ABNORMAL HIGH (ref 11.4–15.2)

## 2019-02-24 LAB — APTT: aPTT: 68 seconds — ABNORMAL HIGH (ref 24–36)

## 2019-02-24 LAB — LACTATE DEHYDROGENASE: LDH: 243 U/L — ABNORMAL HIGH (ref 98–192)

## 2019-02-24 LAB — STREP PNEUMONIAE URINARY ANTIGEN: Strep Pneumo Urinary Antigen: NEGATIVE

## 2019-02-24 LAB — LACTIC ACID, PLASMA
Lactic Acid, Venous: 0.9 mmol/L (ref 0.5–1.9)
Lactic Acid, Venous: 0.5 mmol/L (ref 0.5–1.9)

## 2019-02-24 LAB — PROCALCITONIN: Procalcitonin: 0.17 ng/mL

## 2019-02-24 LAB — FIBRIN DERIVATIVES D-DIMER (ARMC ONLY): Fibrin derivatives D-dimer (ARMC): 1379.01 ng/mL (FEU) — ABNORMAL HIGH (ref 0.00–499.00)

## 2019-02-24 MED ORDER — CALCITRIOL 0.25 MCG PO CAPS
0.2500 ug | ORAL_CAPSULE | Freq: Every day | ORAL | Status: DC
  Administered 2019-02-24 – 2019-02-27 (×4): 0.25 ug via ORAL
  Filled 2019-02-24 (×7): qty 1

## 2019-02-24 MED ORDER — MYCOPHENOLATE MOFETIL 250 MG PO CAPS: 250.0000 mg | ORAL_CAPSULE | Freq: Two times a day (BID) | ORAL | Status: DC

## 2019-02-24 MED ORDER — HEPARIN BOLUS VIA INFUSION
4000.0000 [IU] | Freq: Once | INTRAVENOUS | Status: AC
  Administered 2019-02-24: 4000 [IU] via INTRAVENOUS
  Filled 2019-02-24: qty 4000

## 2019-02-24 MED ORDER — SODIUM CHLORIDE 0.9 % IV SOLN: INTRAVENOUS | Status: DC

## 2019-02-24 MED ORDER — MAGNESIUM OXIDE 400 (241.3 MG) MG PO TABS
400.0000 mg | ORAL_TABLET | Freq: Two times a day (BID) | ORAL | Status: DC
  Administered 2019-02-24 – 2019-02-28 (×8): 400 mg via ORAL
  Filled 2019-02-24 (×9): qty 1

## 2019-02-24 MED ORDER — WARFARIN SODIUM 2 MG PO TABS: 2.0000 mg | ORAL_TABLET | Freq: Every day | ORAL | Status: DC

## 2019-02-24 MED ORDER — SODIUM CHLORIDE 0.9 % IV SOLN
2.0000 g | Freq: Once | INTRAVENOUS | Status: AC
  Administered 2019-02-24: 2 g via INTRAVENOUS
  Filled 2019-02-24: qty 2

## 2019-02-24 MED ORDER — VANCOMYCIN HCL 1500 MG/300ML IV SOLN
1500.0000 mg | Freq: Once | INTRAVENOUS | Status: AC
  Administered 2019-02-24: 05:00:00 1500 mg via INTRAVENOUS
  Filled 2019-02-24: qty 300

## 2019-02-24 MED ORDER — POTASSIUM CHLORIDE CRYS ER 20 MEQ PO TBCR
10.0000 meq | EXTENDED_RELEASE_TABLET | Freq: Two times a day (BID) | ORAL | Status: DC
  Administered 2019-02-24 – 2019-02-28 (×9): 10 meq via ORAL
  Filled 2019-02-24 (×11): qty 1

## 2019-02-24 MED ORDER — SALINE SPRAY 0.65 % NA SOLN
1.0000 | NASAL | Status: DC | PRN
  Filled 2019-02-24: qty 44

## 2019-02-24 MED ORDER — ALPRAZOLAM 0.25 MG PO TABS
0.2500 mg | ORAL_TABLET | Freq: Two times a day (BID) | ORAL | Status: DC | PRN
  Administered 2019-02-27: 0.25 mg via ORAL
  Filled 2019-02-24: qty 1

## 2019-02-24 MED ORDER — AMLODIPINE BESYLATE 10 MG PO TABS
10.0000 mg | ORAL_TABLET | Freq: Every day | ORAL | Status: DC
  Administered 2019-02-24 – 2019-02-28 (×5): 10 mg via ORAL
  Filled 2019-02-24 (×2): qty 1
  Filled 2019-02-24: qty 2
  Filled 2019-02-24: qty 1
  Filled 2019-02-24: qty 2

## 2019-02-24 MED ORDER — LABETALOL HCL 200 MG PO TABS
200.0000 mg | ORAL_TABLET | Freq: Two times a day (BID) | ORAL | Status: DC
  Administered 2019-02-24 – 2019-02-28 (×9): 200 mg via ORAL
  Filled 2019-02-24 (×14): qty 1

## 2019-02-24 MED ORDER — CITALOPRAM HYDROBROMIDE 20 MG PO TABS
20.0000 mg | ORAL_TABLET | Freq: Every day | ORAL | Status: DC
  Administered 2019-02-24 – 2019-02-28 (×5): 20 mg via ORAL
  Filled 2019-02-24 (×5): qty 1

## 2019-02-24 MED ORDER — ACETAMINOPHEN 500 MG PO TABS
1000.0000 mg | ORAL_TABLET | Freq: Once | ORAL | Status: AC
  Administered 2019-02-24: 1000 mg via ORAL
  Filled 2019-02-24: qty 2

## 2019-02-24 MED ORDER — LABETALOL HCL 5 MG/ML IV SOLN: 20.0000 mg | INTRAVENOUS | Status: DC | PRN

## 2019-02-24 MED ORDER — METRONIDAZOLE IN NACL 5-0.79 MG/ML-% IV SOLN
500.0000 mg | Freq: Once | INTRAVENOUS | Status: AC
  Administered 2019-02-24: 500 mg via INTRAVENOUS
  Filled 2019-02-24: qty 100

## 2019-02-24 MED ORDER — MYCOPHENOLATE MOFETIL 250 MG PO CAPS
250.0000 mg | ORAL_CAPSULE | Freq: Every morning | ORAL | Status: DC
  Administered 2019-02-24 – 2019-02-28 (×5): 250 mg via ORAL
  Filled 2019-02-24 (×5): qty 1

## 2019-02-24 MED ORDER — HEPARIN (PORCINE) 25000 UT/250ML-% IV SOLN
1050.0000 [IU]/h | INTRAVENOUS | Status: DC
  Administered 2019-02-24 – 2019-02-25 (×2): 1000 [IU]/h via INTRAVENOUS
  Administered 2019-02-26 – 2019-02-28 (×3): 1050 [IU]/h via INTRAVENOUS
  Filled 2019-02-24 (×5): qty 250

## 2019-02-24 MED ORDER — ONDANSETRON HCL 4 MG/2ML IJ SOLN
4.0000 mg | Freq: Four times a day (QID) | INTRAMUSCULAR | Status: DC
  Administered 2019-02-26 – 2019-02-28 (×7): 4 mg via INTRAVENOUS
  Filled 2019-02-24 (×11): qty 2

## 2019-02-24 MED ORDER — SODIUM CHLORIDE 0.9 % IV BOLUS
1000.0000 mL | Freq: Once | INTRAVENOUS | Status: AC
  Administered 2019-02-24: 1000 mL via INTRAVENOUS

## 2019-02-24 MED ORDER — MYCOPHENOLATE MOFETIL 250 MG PO CAPS
500.0000 mg | ORAL_CAPSULE | Freq: Every day | ORAL | Status: DC
  Administered 2019-02-24 – 2019-02-27 (×4): 500 mg via ORAL
  Filled 2019-02-24 (×8): qty 2

## 2019-02-24 MED ORDER — MAGNESIUM OXIDE 400 (241.3 MG) MG PO TABS
400.0000 mg | ORAL_TABLET | Freq: Two times a day (BID) | ORAL | Status: DC
  Administered 2019-02-24: 09:00:00 400 mg via ORAL
  Filled 2019-02-24: qty 1

## 2019-02-24 MED ORDER — TACROLIMUS 0.5 MG PO CAPS
0.5000 mg | ORAL_CAPSULE | Freq: Two times a day (BID) | ORAL | Status: DC
  Administered 2019-02-24 – 2019-02-28 (×9): 0.5 mg via ORAL
  Filled 2019-02-24 (×14): qty 1

## 2019-02-24 MED ORDER — BUPROPION HCL ER (XL) 150 MG PO TB24
300.0000 mg | ORAL_TABLET | Freq: Every day | ORAL | Status: DC
  Administered 2019-02-24 – 2019-02-28 (×5): 300 mg via ORAL
  Filled 2019-02-24 (×5): qty 2

## 2019-02-24 MED ORDER — FLUOROURACIL 5 % EX CREA
TOPICAL_CREAM | Freq: Two times a day (BID) | CUTANEOUS | Status: DC
  Filled 2019-02-24 (×5): qty 40

## 2019-02-24 MED ORDER — SODIUM CHLORIDE 0.9 % IV SOLN
2.0000 g | INTRAVENOUS | Status: AC
  Administered 2019-02-24 – 2019-02-28 (×5): 2 g via INTRAVENOUS
  Filled 2019-02-24: qty 2
  Filled 2019-02-24 (×3): qty 20
  Filled 2019-02-24: qty 2

## 2019-02-24 MED ORDER — GUAIFENESIN ER 600 MG PO TB12
600.0000 mg | ORAL_TABLET | Freq: Two times a day (BID) | ORAL | Status: DC
  Administered 2019-02-24 – 2019-02-28 (×10): 600 mg via ORAL
  Filled 2019-02-24 (×10): qty 1

## 2019-02-24 MED ORDER — DICYCLOMINE HCL 10 MG PO CAPS
10.0000 mg | ORAL_CAPSULE | Freq: Three times a day (TID) | ORAL | Status: DC
  Administered 2019-02-24 – 2019-02-28 (×12): 10 mg via ORAL
  Filled 2019-02-24 (×16): qty 1

## 2019-02-24 MED ORDER — DIPHENOXYLATE-ATROPINE 2.5-0.025 MG PO TABS: 1.0000 | ORAL_TABLET | Freq: Four times a day (QID) | ORAL | Status: DC | PRN

## 2019-02-24 MED ORDER — FUROSEMIDE 10 MG/ML IJ SOLN: 40.0000 mg | Freq: Two times a day (BID) | INTRAMUSCULAR | Status: DC

## 2019-02-24 MED ORDER — VANCOMYCIN HCL IN DEXTROSE 1-5 GM/200ML-% IV SOLN
1000.0000 mg | Freq: Once | INTRAVENOUS | Status: DC
  Filled 2019-02-24: qty 200

## 2019-02-24 MED ORDER — PANTOPRAZOLE SODIUM 40 MG PO TBEC
40.0000 mg | DELAYED_RELEASE_TABLET | Freq: Every day | ORAL | Status: DC
  Administered 2019-02-24 – 2019-02-27 (×4): 40 mg via ORAL
  Filled 2019-02-24 (×6): qty 1

## 2019-02-24 MED ORDER — SODIUM CHLORIDE 0.9 % IV SOLN
500.0000 mg | INTRAVENOUS | Status: AC
  Administered 2019-02-24 – 2019-02-28 (×5): 500 mg via INTRAVENOUS
  Filled 2019-02-24 (×5): qty 500

## 2019-02-24 MED ORDER — LABETALOL HCL 5 MG/ML IV SOLN
INTRAVENOUS | Status: AC
  Administered 2019-02-24: 20 mg via INTRAVENOUS
  Filled 2019-02-24: qty 4

## 2019-02-24 MED ORDER — POTASSIUM CHLORIDE ER 10 MEQ PO TBCR: 10.0000 meq | EXTENDED_RELEASE_TABLET | Freq: Two times a day (BID) | ORAL | Status: DC

## 2019-02-24 MED ORDER — DONEPEZIL HCL 5 MG PO TABS
10.0000 mg | ORAL_TABLET | Freq: Every day | ORAL | Status: DC
  Administered 2019-02-24 – 2019-02-27 (×4): 10 mg via ORAL
  Filled 2019-02-24 (×5): qty 2

## 2019-02-24 MED ORDER — FUROSEMIDE 10 MG/ML IJ SOLN
60.0000 mg | Freq: Once | INTRAMUSCULAR | Status: AC
  Administered 2019-02-24: 10:00:00 60 mg via INTRAVENOUS
  Filled 2019-02-24: qty 8

## 2019-02-24 MED ORDER — WARFARIN SODIUM 3 MG PO TABS
3.0000 mg | ORAL_TABLET | Freq: Once | ORAL | Status: AC
  Administered 2019-02-24: 05:00:00 3 mg via ORAL
  Filled 2019-02-24: qty 1

## 2019-02-24 MED ORDER — ENOXAPARIN SODIUM 40 MG/0.4ML ~~LOC~~ SOLN: 40.0000 mg | SUBCUTANEOUS | Status: DC

## 2019-02-24 MED ORDER — ADULT MULTIVITAMIN W/MINERALS CH
1.0000 | ORAL_TABLET | Freq: Every day | ORAL | Status: DC
  Administered 2019-02-24 – 2019-02-28 (×5): 1 via ORAL
  Filled 2019-02-24 (×5): qty 1

## 2019-02-24 MED ORDER — PREDNISONE 5 MG PO TABS
5.0000 mg | ORAL_TABLET | Freq: Every day | ORAL | Status: DC
  Administered 2019-02-24 – 2019-02-28 (×5): 5 mg via ORAL
  Filled 2019-02-24 (×5): qty 1

## 2019-02-24 NOTE — H&P (Signed)
Eric Gill at Eric Gill NAME: Eric Gill    MR#:  [id number]  DATE OF BIRTH:  [birth year]  DATE OF ADMISSION:  02/24/2019  PRIMARY CARE PHYSICIAN: Eric Crouch, MD   REQUESTING/REFERRING PHYSICIAN: Harvest Dark, MD  CHIEF COMPLAINT:   Chief Complaint  Patient presents with  . Shortness of Breath  . Fever    HISTORY OF PRESENT ILLNESS:  Eric Gill  is a 69 y.o. male with a known history of multiple medical problems that are mentioned below, who presented to the emergency room with onset of worsening dyspnea since yesterday with associated dry cough and wheezing.  He admitted to fever and chills.  Not any chest pain or palpitations.  No nausea vomiting or diarrhea or abdominal pain.  When asked about COVID-19 exposure he stated that he was in the emergency room last week and stayed about 13 hours.  He denied any bleeding diathesis.  No dysuria, oliguria or hematuria or flank pain.  Upon presentation to the emergency room, blood pressure was 189/90 with a pulse 104 respiratory 24 and pulse which was 9 9% on her percent nonrebreather with a temperature of 98.7.  It was then tapered to 6 L of O2 by nasal cannula.  Labs were remarkable for a BUN of 45 and creatinine 4.07 compared to 37 and 3 on 10/30/2018.  Is LDH was 243 and ferritin 303.  Lactic acid 0.9 procalcitonin 0.17 and CBC showed anemia that is close to his previous levels.  Urinalysis came back negative.  Blood cultures were drawn.  Portable chest x-ray showed cardiomegaly and prominent bilateral interstitial lung markings with focal airspace opacities in the right mid and right lower lung zone is concerning for an atypical infectious process versus developing pulmonary edema.  Patient was given 1 g of p.o. Tylenol, IV cefepime, vancomycin and Flagyl and 1 L IV normal saline.  The patient will be admitted to medical monitored bed for further evaluation and management. PAST MEDICAL HISTORY:    Past Medical History:  Diagnosis Date  . Anemia   . Antiphospholipid antibody syndrome ([place])   . Arthritis   . DVT (deep venous thrombosis) ([place])   . Essential hypertension   . Gastric ulcer   . GERD (gastroesophageal reflux disease)   . History of CMV   . Hyperlipidemia   . Hyperparathyroidism ([place])   . Pedal edema   . Peripheral vascular disease ([place])   . Rosacea   . Status post kidney transplant 2001  . Stroke ([place])   . TIA (transient ischemic attack)   . Vascular dementia without behavioral disturbance ([place])     PAST SURGICAL HISTORY:   Past Surgical History:  Procedure Laterality Date  . CHOLECYSTECTOMY    . COLONOSCOPY WITH PROPOFOL N/A 03/06/2015   Procedure: COLONOSCOPY WITH PROPOFOL;  Surgeon: Eric Class, MD;  Location[facility] ENDOSCOPY;  Service: Endoscopy;  Laterality: N/A;  . ELBOW SURGERY    . ESOPHAGOGASTRODUODENOSCOPY Left 03/01/2015   Procedure: ESOPHAGOGASTRODUODENOSCOPY (EGD);  Surgeon: Eric Luster, MD;  Location: [facility] ENDOSCOPY;  Service: Endoscopy;  Laterality: Left;  . ESOPHAGOGASTRODUODENOSCOPY (EGD) WITH PROPOFOL  03/06/2015   Procedure: ESOPHAGOGASTRODUODENOSCOPY (EGD) WITH PROPOFOL;  Surgeon: Eric Class, MD;  Location: [facility] ENDOSCOPY;  Service: Endoscopy;;  . ESOPHAGOGASTRODUODENOSCOPY (EGD) WITH PROPOFOL N/A 05/25/2015   Procedure: ESOPHAGOGASTRODUODENOSCOPY (EGD) WITH PROPOFOL;  Surgeon: Eric Class, MD;  Location: [facility] ENDOSCOPY;  Service: Endoscopy;  Laterality: N/A;  . KIDNEY TRANSPLANT    .  KNEE ARTHROSCOPY      SOCIAL HISTORY:   Social History   Tobacco Use  . Smoking status: Former Smoker    Packs/day: 1.00    Years: 30.00    Pack years: 30.00    Types: Cigarettes    Quit date: 1999    Years since quitting: 22.0  . Smokeless tobacco: Never Used  Substance Use Topics  . Alcohol use: No    FAMILY HISTORY:  No family history on file.  DRUG ALLERGIES:   Allergies  Allergen Reactions  . Losartan Cough  .  Statins Nausea And Vomiting and Other (See Comments)    Reaction:  Muscle pain     REVIEW OF SYSTEMS:   ROS As per history of present illness. All pertinent systems were reviewed above. Constitutional,  HEENT, cardiovascular, respiratory, GI, GU, musculoskeletal, neuro, psychiatric, endocrine,  integumentary and hematologic systems were reviewed and are otherwise  negative/unremarkable except for positive findings mentioned above in the HPI.   MEDICATIONS AT HOME:   Prior to Admission medications   Medication Sig Start Date End Date Taking? Authorizing Provider  ALPRAZolam (XANAX) 0.25 MG tablet Take 0.25 mg by mouth 2 (two) times daily as needed. 02/18/19  Yes [provider]  amLODipine (NORVASC) 10 MG tablet Take 10 mg by mouth daily.   Yes [provider]  budesonide-formoterol (SYMBICORT) 80-4.5 MCG/ACT inhaler Inhale 2 puffs into the lungs 2 (two) times daily.   Yes [provider]  buPROPion (WELLBUTRIN XL) 300 MG 24 hr tablet Take 300 mg by mouth daily. 08/17/18  Yes [provider]  calcitRIOL (ROCALTROL) 0.25 MCG capsule Take 0.25 mcg by mouth at bedtime.   Yes [provider]  citalopram (CELEXA) 20 MG tablet Take 20 mg by mouth daily.   Yes [provider]  dicyclomine (BENTYL) 10 MG capsule Take 10 mg by mouth 3 (three) times daily before meals.  01/01/18  Yes [provider]  diphenoxylate-atropine (LOMOTIL) 2.5-0.025 MG tablet Take 1 tablet by mouth 4 (four) times daily as needed for diarrhea or loose stools.   Yes [provider]  donepezil (ARICEPT) 10 MG tablet Take 10 mg by mouth at bedtime.   Yes [provider]  fluorouracil (EFUDEX) 5 % cream  02/23/17  Yes [provider]  furosemide (LASIX) 40 MG tablet Take 40 mg by mouth daily as needed.  02/08/17  Yes [provider]  labetalol (NORMODYNE) 200 MG tablet Take 200 mg by mouth 2 (two) times daily.    Yes [provider]  magnesium oxide (MAG-OX) 400 MG tablet TAKE ONE TABLET BY MOUTH TWICE DAILY 02/22/17  Yes [provider]  Magnesium Oxide 400 (240 Mg) MG TABS Take 1 tablet by mouth 2 (two) times daily. 02/18/19  Yes [provider]  Multiple Vitamin (MULTI-VITAMINS) TABS Take 1 tablet by mouth daily.    Yes [provider]  mycophenolate (CELLCEPT) 250 MG capsule Take 250-500 mg by mouth 2 (two) times daily. 250 mg in the am and 500 mg at bedtime   Yes [provider]  pantoprazole (PROTONIX) 40 MG tablet Take 1 tablet (40 mg total) by mouth 2 (two) times daily before a meal. Patient taking differently: Take 40 mg by mouth daily.  03/06/15  Yes Epifanio Lesches, MD  potassium chloride (K-DUR) 10 MEQ tablet Take 10 mEq by mouth 2 (two) times daily. 10/01/18  Yes [provider]  predniSONE (DELTASONE) 5 MG tablet Take 5 mg by  mouth daily. 02/18/19  Yes [provider]  tacrolimus (PROGRAF) 0.5 MG capsule Take 1 capsule by mouth 2 (two) times a day. 04/28/18 04/28/19 Yes [provider]  warfarin (JANTOVEN) 1 MG tablet Take 2-3 mg by mouth daily. Take two tablets (2mg ) on Sunday, Tuesday,Weds, Thursday and Saturday. Take three tablets (3 mg) on Monday and Friday. 08/28/18  Yes [provider]      VITAL SIGNS:  Blood pressure (!) 189/90, pulse (!) 104, temperature 98.7 F (37.1 C), temperature source Oral, resp. rate (!) 24, height 5\' 3"  (1.6 m), weight 63.5 kg, SpO2 99 %.  PHYSICAL EXAMINATION:  Physical Exam  GENERAL:  69 y.o.-year-old male patient lying in the bed with mild respiratory stress with conversational dyspnea EYES: Pupils equal, round, reactive to light and accommodation. No scleral icterus. Extraocular muscles intact.  HEENT: Head atraumatic, normocephalic. Oropharynx and nasopharynx clear.  NECK:  Supple, no jugular venous distention. No thyroid enlargement, no tenderness.  LUNGS: Diminished bibasal breath  sounds with bibasal crackles.  CARDIOVASCULAR: Regular rate and rhythm, S1, S2 normal. No murmurs, rubs, or gallops.  ABDOMEN: Soft, nondistended, nontender. Bowel sounds present. No organomegaly or mass.  EXTREMITIES: No pedal edema, cyanosis, or clubbing.  NEUROLOGIC: Cranial nerves II through XII are intact. Muscle strength 5/5 in all extremities. Sensation intact. Gait not checked.  PSYCHIATRIC: The patient is alert and oriented x 3.  Normal affect and good eye contact. SKIN: No obvious rash, lesion, or ulcer.   LABORATORY PANEL:   CBC Recent Labs  Lab 02/24/19 0020  WBC 8.7  HGB 9.8*  HCT 31.8*  PLT 141*   ------------------------------------------------------------------------------------------------------------------  Chemistries  Recent Labs  Lab 02/24/19 0020  NA 137  K 4.5  CL 106  CO2 23  GLUCOSE 101*  BUN 45*  CREATININE 4.07*  CALCIUM 9.2  AST 19  ALT 13  ALKPHOS 100  BILITOT 1.2   ------------------------------------------------------------------------------------------------------------------  Cardiac Enzymes No results for input(s): TROPONINI in the last 168 hours. ------------------------------------------------------------------------------------------------------------------  RADIOLOGY:  DG Chest Port 1 View  Result Date: 02/24/2019 CLINICAL DATA:  Fever and shortness of breath EXAM: PORTABLE CHEST 1 VIEW COMPARISON:  10/21/2018 FINDINGS: There are scattered airspace opacities bilaterally, greatest within the right lower and right mid lung zone. The heart size is enlarged. Prominent interstitial lung markings are noted bilaterally. There is no pneumothorax. There may be small bilateral pleural effusions. There is no acute osseous abnormality. Aortic calcifications are noted. IMPRESSION: Prominent bilateral interstitial lung markings with focal airspace opacities in the right mid and right lower lung zone is concerning for an atypical infectious  process versus developing pulmonary edema. Cardiomegaly. Electronically Signed   By: Constance Holster M.D.   On: 02/24/2019 00:42      IMPRESSION AND PLAN:   1.  Community acquired pneumonia with subsequent sepsis and acute hypoxemic respiratory failure.  The patient will be admitted to medical monitored bed.  He will be continued on IV antibiotic therapy with Rocephin and Zithromax.  Will follow blood and  sputum culture.  Mucolytic therapy will be provided.  2.  Acute kidney injury on top of stage IV chronic kidney disease.  This could be related to mild acute likely diastolic CHF given elevated BNP.  Will diurese with same home dose of Lasix and IV form for now pending a chest CT without contrast for further assessment.  3.  Elevated BNP.  Differential diagnosis would include acute cor pulmonale secondary to #1.  Patient will be diuresed  with IV Lasix.  We will also obtain a VQ scan in a.m. to assess for PE given subtherapeutic INR on Coumadin with history of antiphospholipid syndrome..  Will add D-dimer.  4.  Status post renal transplant.  We will continue Prograf, CellCept and prednisone.  5.  Hypertension.  We will continue his antihypertensives.  6.  Depression.  We will continue Celexa.  7.  DVT prophylaxis.  Patient will be on IV heparin especially given subtherapeutic INR and antiphospholipid syndrome.  All the records are reviewed and case discussed with ED provider. The plan of care was discussed in details with the patient (and family). I answered all questions. The patient agreed to proceed with the above mentioned plan. Further management will depend upon hospital course.   CODE STATUS: Full code  TOTAL TIME TAKING CARE OF THIS PATIENT: 55 minutes.    Christel Mormon M.D on 02/24/2019 at 3:23 AM  [facility]   From 7 PM-7 AM, contact night-coverage [email or web address]  CC: Primary care physician; Eric Crouch, MD   Note: This dictation was prepared with  Dragon dictation along with smaller phrase technology. Any transcriptional errors that result from this process are unintentional.

## 2019-02-24 NOTE — ED Notes (Signed)
Pt given breakfast tray at this time. Pt eating

## 2019-02-24 NOTE — Social Work (Signed)
TOC CM/SW consult received for possible skills nursing facility placement. Pt consult also ordered.   Social worker is following.   Berenice Bouton, MSW, LCSW  [phone] 8am-6pm (weekends) or CSW ED # [phone]

## 2019-02-24 NOTE — ED Notes (Signed)
Pt given meal tray and drink at this time 

## 2019-02-24 NOTE — ED Triage Notes (Signed)
Patient brought in from home by ems. Patient called out for shortness of breath. Patient initial oxygen saturations 75% on room air. Patient placed on non rebreather now at 99 %. Patient had a fever of 102 at home and for ems 102.5.

## 2019-02-24 NOTE — ED Notes (Signed)
Admitting md at bedside

## 2019-02-24 NOTE — ED Notes (Signed)
Pt had saturated his brief, gown and bed. Pt cleaned up and new linens applied to bed. Pt placed in clean gown and new brief. Condom cath placed at this time. Pt repositioned and resting comfortably at this time. Lights deemed for pt.

## 2019-02-24 NOTE — ED Notes (Signed)
Wife speaking to pt at this time.

## 2019-02-24 NOTE — Progress Notes (Signed)
Patient in bed resting. Call bell in reach and provided some apple juice. Will continue to monitor.

## 2019-02-24 NOTE — Progress Notes (Signed)
PHARMACY -  BRIEF ANTIBIOTIC NOTE   Pharmacy has received consult(s) for vanc/cefepime from an ED provider.  The patient's profile has been reviewed for ht/wt/allergies/indication/available labs.    One time order(s) placed for vanc 1.5g IV load   Further antibiotics/pharmacy consults should be ordered by admitting physician if indicated.                       Thank you,  Tobie Lords, PharmD, BCPS Clinical Pharmacist 02/24/2019  3:07 AM

## 2019-02-24 NOTE — ED Notes (Signed)
Patient incontinent of urine. Patient changed and repositioned at this time.

## 2019-02-24 NOTE — ED Notes (Signed)
Report to [place], rn

## 2019-02-24 NOTE — ED Notes (Signed)
Wife given update on patient status.

## 2019-02-24 NOTE — ED Notes (Signed)
Pt currently on 6l [place], pt is resting. Pt denies any pain at this time

## 2019-02-24 NOTE — ED Notes (Signed)
US at bedside

## 2019-02-24 NOTE — ED Notes (Signed)
Entered room and pt has vomited on himself. Pt informed we would get him cleaned up and clean gown placed on him.

## 2019-02-24 NOTE — ED Notes (Signed)
Pt pulled up in the bed. Pt eating supper at this time. Pt checked and still dry. Condom cath draining, no leakage noted.

## 2019-02-24 NOTE — ED Notes (Signed)
Pt given meal tray and eating at this time.

## 2019-02-24 NOTE — Progress Notes (Signed)
ANTICOAGULATION CONSULT NOTE - Initial Consult  Pharmacy Consult for heparin/warfarin Indication: antiphospholipid syndrome w/ h/o DVTs  Allergies  Allergen Reactions  . Losartan Cough  . Statins Nausea And Vomiting and Other (See Comments)    Reaction:  Muscle pain     Patient Measurements: Height: 5\' 3"  (160 cm) Weight: 140 lb (63.5 kg) IBW/kg (Calculated) : 56.9 Heparin Dosing Weight: 63.5 kg  Vital Signs: Temp: 98.7 F (37.1 C) (01/17 0017) Temp Source: Oral (01/17 0017) BP: 189/90 (01/17 0017) Pulse Rate: 104 (01/17 0017)  Labs: Recent Labs    02/24/19 0020  HGB 9.8*  HCT 31.8*  PLT 141*  APTT 68*  LABPROT 17.5*  INR 1.4*  CREATININE 4.07*    Estimated Creatinine Clearance: 13.6 mL/min (A) (by C-G formula based on SCr of 4.07 mg/dL (H)).   Medical History: Past Medical History:  Diagnosis Date  . Anemia   . Antiphospholipid antibody syndrome ([place])   . Arthritis   . DVT (deep venous thrombosis) ([place])   . Essential hypertension   . Gastric ulcer   . GERD (gastroesophageal reflux disease)   . History of CMV   . Hyperlipidemia   . Hyperparathyroidism ([place])   . Pedal edema   . Peripheral vascular disease ([place])   . Rosacea   . Status post kidney transplant 2001  . Stroke ([place])   . TIA (transient ischemic attack)   . Vascular dementia without behavioral disturbance ([facility])     Medications:  Scheduled:  . amLODipine  10 mg Oral Daily  . buPROPion  300 mg Oral Daily  . calcitRIOL  0.25 mcg Oral QHS  . citalopram  20 mg Oral Daily  . dicyclomine  10 mg Oral TID AC  . donepezil  10 mg Oral QHS  . fluorouracil   Topical BID  . guaiFENesin  600 mg Oral BID  . heparin  4,000 Units Intravenous Once  . labetalol  200 mg Oral BID  . magnesium oxide  400 mg Oral BID  . magnesium oxide  400 mg Oral BID  . multivitamin with minerals  1 tablet Oral Daily  . mycophenolate  250 mg Oral q morning - 10a   And  . mycophenolate  500 mg Oral QHS  . pantoprazole   40 mg Oral Daily  . predniSONE  5 mg Oral Daily  . tacrolimus  0.5 mg Oral BID  . warfarin  3 mg Oral Once    Assessment: Patient arrives for SOB and feeling weak w/ h/o antiphospholipid syndrome w/ h/o DVTs being anticoagulated w/ warfarin PTA as follows: Warfarin 3 mg MF Warfarin 2 mg all other days See anticoagulation clinic at [facility] that has placed him on a goal INR of 2.5 - 3.0. [name] INR is subtherapeutic tonight at 1.4 and therefore patient will be bridged w/ heparin until patient can have x2 INRs within 2.5 - 3.0. baseline CBC 10 - 10.5, tonight is lower than baseline, aPTT 68 seconds.  Goal of Therapy:  INR 2.5 -3.0  Heparin level goal 0.3 - 0.7 Monitor platelets by anticoagulation protocol: Yes   Plan:  Will bolus heparin 4000 units IV x 1 and give patient a dose of warfarin 3 mg po x 1. Will start rate at 1000 units/hr. Will check anti-Xa at 1200 and daily INRs. Will monitor daily CBC's and adjust per anti-Xa levels.  Tobie Lords, PharmD, BCPS Clinical Pharmacist 02/24/2019,3:38 AM

## 2019-02-24 NOTE — ED Provider Notes (Signed)
[facility] Emergency Department Provider Note  Time seen: 12:09 AM  I have reviewed the triage vital signs and the nursing notes.   HISTORY  Chief Complaint Hypoxia, dyspnea, fever  HPI Eric Gill is a 69 y.o. male with a past medical history of anemia, COPD, hypertension, hyperlipidemia, dementia, presents to the emergency department for shortness of breath and fever.  According EMS they were called out to the patient's residence tonight for shortness of breath noted by the family.  Patient has a history of COPD does not wear oxygen at baseline.  EMS states O2 saturation in the low 70s on room air, placed on a nonrebreather and brought to the emergency department.  Patient found to be febrile to 102 by EMS.  Patient does take cough.  Due to dementia cannot contribute much more to his history or review of systems at this time.   Past Medical History:  Diagnosis Date  . Anemia   . Antiphospholipid antibody syndrome ([place])   . Arthritis   . DVT (deep venous thrombosis) ([place])   . Essential hypertension   . Gastric ulcer   . GERD (gastroesophageal reflux disease)   . History of CMV   . Hyperlipidemia   . Hyperparathyroidism ([place])   . Pedal edema   . Peripheral vascular disease ([place])   . Rosacea   . Status post kidney transplant 2001  . Stroke ([place])   . TIA (transient ischemic attack)   . Vascular dementia without behavioral disturbance [facility])     Patient Active Problem List   Diagnosis Date Noted  . Renal failure (ARF), acute on chronic (HCC) 10/16/2018  . Rectal bleeding   . GI bleed 02/23/2015  . Acute blood loss anemia 02/23/2015    Past Surgical History:  Procedure Laterality Date  . CHOLECYSTECTOMY    . COLONOSCOPY WITH PROPOFOL N/A 03/06/2015   Procedure: COLONOSCOPY WITH PROPOFOL;  Surgeon: Josefine Class, MD;  Location: [facility] ENDOSCOPY;  Service: Endoscopy;  Laterality: N/A;  . ELBOW SURGERY    . ESOPHAGOGASTRODUODENOSCOPY Left  03/01/2015   Procedure: ESOPHAGOGASTRODUODENOSCOPY (EGD);  Surgeon: Hulen Luster, MD;  Location: [facility] ENDOSCOPY;  Service: Endoscopy;  Laterality: Left;  . ESOPHAGOGASTRODUODENOSCOPY (EGD) WITH PROPOFOL  03/06/2015   Procedure: ESOPHAGOGASTRODUODENOSCOPY (EGD) WITH PROPOFOL;  Surgeon: Josefine Class, MD;  Location: [facility] ENDOSCOPY;  Service: Endoscopy;;  . ESOPHAGOGASTRODUODENOSCOPY (EGD) WITH PROPOFOL N/A 05/25/2015   Procedure: ESOPHAGOGASTRODUODENOSCOPY (EGD) WITH PROPOFOL;  Surgeon: Josefine Class, MD;  Location: [facility] ENDOSCOPY;  Service: Endoscopy;  Laterality: N/A;  . KIDNEY TRANSPLANT    . KNEE ARTHROSCOPY      Prior to Admission medications   Medication Sig Start Date End Date Taking? Authorizing Provider  amLODipine (NORVASC) 10 MG tablet Take 10 mg by mouth daily.    [provider]  budesonide-formoterol (SYMBICORT) 80-4.5 MCG/ACT inhaler Inhale 2 puffs into the lungs 2 (two) times daily.    [provider]  buPROPion (WELLBUTRIN XL) 300 MG 24 hr tablet Take 300 mg by mouth daily. 08/17/18   [provider]  calcitRIOL (ROCALTROL) 0.25 MCG capsule Take 0.25 mcg by mouth at bedtime.    [provider]  citalopram (CELEXA) 20 MG tablet Take 20 mg by mouth daily.    [provider]  dicyclomine (BENTYL) 10 MG capsule Take 10 mg by mouth 3 (three) times daily before meals.  01/01/18   [provider]  diphenoxylate-atropine (LOMOTIL) 2.5-0.025 MG tablet Take 1 tablet by mouth 4 (  four) times daily as needed for diarrhea or loose stools.    [provider]  donepezil (ARICEPT) 10 MG tablet Take 10 mg by mouth at bedtime.    [provider]  fluorouracil (EFUDEX) 5 % cream  02/23/17   [provider]  furosemide (LASIX) 40 MG tablet Take 40 mg by mouth daily as needed.  02/08/17   [provider]  labetalol (NORMODYNE) 200 MG tablet Take 200 mg by mouth 2 (two) times daily.     [provider]   magnesium oxide (MAG-OX) 400 MG tablet TAKE ONE TABLET BY MOUTH TWICE DAILY 02/22/17   [provider]  Multiple Vitamin (MULTI-VITAMINS) TABS Take 1 tablet by mouth daily.     [provider]  mycophenolate (CELLCEPT) 250 MG capsule Take 250-500 mg by mouth 2 (two) times daily. 250 mg in the am and 500 mg at bedtime    [provider]  pantoprazole (PROTONIX) 40 MG tablet Take 1 tablet (40 mg total) by mouth 2 (two) times daily before a meal. Patient taking differently: Take 40 mg by mouth daily.  03/06/15   Epifanio Lesches, MD  potassium chloride (K-DUR) 10 MEQ tablet Take 10 mEq by mouth 2 (two) times daily. 10/01/18   [provider]  tacrolimus (PROGRAF) 0.5 MG capsule Take 1 capsule by mouth 2 (two) times a day. 04/28/18 04/28/19  [provider]  warfarin (JANTOVEN) 1 MG tablet Take 2-3 mg by mouth daily. Take two tablets (2mg ) on Sunday, Tuesday, Thursday and Saturday. Take three tablets (3 mg) on Monday, Wednesday and Friday. 08/28/18   [provider]    Allergies  Allergen Reactions  . Losartan Cough  . Statins Nausea And Vomiting and Other (See Comments)    Reaction:  Muscle pain     No family history on file.  Social History Social History   Tobacco Use  . Smoking status: Former Smoker    Packs/day: 1.00    Years: 30.00    Pack years: 30.00    Types: Cigarettes    Quit date: 1999    Years since quitting: 22.0  . Smokeless tobacco: Never Used  Substance Use Topics  . Alcohol use: No  . Drug use: No    Review of Systems Unable to obtain adequate/accurate review of systems secondary to baseline dementia.  ____________________________________________   PHYSICAL EXAM:  Constitutional: [name] awake and alert, no acute distress.  Able to answer questions and follow basic commands. Eyes: Normal exam ENT      Head: Normocephalic and atraumatic.      Mouth/Throat: Mucous membranes are moist. Cardiovascular:  Normal rate, regular rhythm around 100 bpm. Respiratory: Mild tachypnea, mild end expiratory wheezes bilaterally.  No obvious rhonchi. Gastrointestinal: Soft and nontender. No distention.  Musculoskeletal: Nontender with normal range of motion in all extremities. No lower extremity tenderness or edema. Neurologic:  Normal speech and language. No gross focal neurologic deficits  Skin:  Skin is warm, dry and intact.  Psychiatric: Mood and affect are normal.  ____________________________________________    EKG  EKG viewed and interpreted by myself shows sinus tachycardia 106 bpm with a narrow QRS, normal axis, normal intervals, nonspecific ST changes  ____________________________________________    RADIOLOGY  Chest x-ray shows possible atypical pneumonia versus pulmonary edema  ____________________________________________   INITIAL IMPRESSION / ASSESSMENT AND PLAN / ED COURSE  Pertinent labs & imaging results that were available during my care of the patient were reviewed by me and  considered in my medical decision making (see chart for details).   Patient presents emergency department for shortness of breath found to be hypoxic in the low 70s on room air as well as febrile to 102.  Given his mild tachycardia tachypnea hypoxia and fever meeting sepsis criteria we will check labs, cultures, x-ray, swab for Covid and continue to closely monitor.  If Covid is positive we will start dexamethasone and remdesivir, anticipate admission given significant hypoxia.  If Covid negative we will start broad-spectrum antibiotics, patient will still require admission to the hospitalist regardless.  [name] rapid Covid is negative.  X-ray is somewhat suspicious for an atypical pneumonia such as coronavirus, a PCR test has been sent.  However given the negative rapid test we will start the patient on broad-spectrum antibiotics while awaiting PCR results.   Eric Gill was evaluated in Emergency  Department on 02/24/2019 for the symptoms described in the history of present illness. He was evaluated in the context of the global COVID-19 pandemic, which necessitated consideration that the patient might be at risk for infection with the SARS-CoV-2 virus that causes COVID-19. Institutional protocols and algorithms that pertain to the evaluation of patients at risk for COVID-19 are in a state of rapid change based on information released by regulatory bodies including the CDC and federal and state organizations. These policies and algorithms were followed during the patient's care in the ED.  CRITICAL CARE Performed by: Harvest Dark   Total critical care time: 30 minutes  Critical care time was exclusive of separately billable procedures and treating other patients.  Critical care was necessary to treat or prevent imminent or life-threatening deterioration.  Critical care was time spent personally by me on the following activities: development of treatment plan with patient and/or surrogate as well as nursing, discussions with consultants, evaluation of patient's response to treatment, examination of patient, obtaining history from patient or surrogate, ordering and performing treatments and interventions, ordering and review of laboratory studies, ordering and review of radiographic studies, pulse oximetry and re-evaluation of patient's condition.  ____________________________________________   FINAL CLINICAL IMPRESSION(S) / ED DIAGNOSES  Hypoxia Dyspnea Sepsis   Harvest Dark, MD 02/24/19 [phone]

## 2019-02-24 NOTE — Progress Notes (Signed)
[place] for heparin/warfarin Indication: antiphospholipid syndrome w/ h/o DVTs  Allergies  Allergen Reactions  . Losartan Cough  . Statins Nausea And Vomiting and Other (See Comments)    Reaction:  Muscle pain     Patient Measurements: Height: 5\' 3"  (160 cm) Weight: 140 lb (63.5 kg) IBW/kg (Calculated) : 56.9 Heparin Dosing Weight: 63.5 kg  Vital Signs: BP: 140/84 (01/17 1725) Pulse Rate: 73 (01/17 1725)  Labs: Recent Labs    02/24/19 0020 02/24/19 0440 02/24/19 1546  HGB 9.8* 8.1*  --   HCT 31.8* 26.5*  --   PLT 141* 113*  --   APTT 68*  --   --   LABPROT 17.5*  --   --   INR 1.4*  --   --   HEPARINUNFRC  --   --  0.60  CREATININE 4.07* 3.66*  --     Estimated Creatinine Clearance: 15.1 mL/min (A) (by C-G formula based on SCr of 3.66 mg/dL (H)).   Medical History: Past Medical History:  Diagnosis Date  . Anemia   . Antiphospholipid antibody syndrome ([place])   . Arthritis   . DVT (deep venous thrombosis) ([place])   . Essential hypertension   . Gastric ulcer   . GERD (gastroesophageal reflux disease)   . History of CMV   . Hyperlipidemia   . Hyperparathyroidism ([place])   . Pedal edema   . Peripheral vascular disease ([place])   . Rosacea   . Status post kidney transplant 2001  . Stroke ([place])   . TIA (transient ischemic attack)   . Vascular dementia without behavioral disturbance (HCC)     Medications:  Scheduled:  . amLODipine  10 mg Oral Daily  . buPROPion  300 mg Oral Daily  . calcitRIOL  0.25 mcg Oral QHS  . citalopram  20 mg Oral Daily  . dicyclomine  10 mg Oral TID AC  . donepezil  10 mg Oral QHS  . fluorouracil   Topical BID  . [START ON 02/25/2019] furosemide  40 mg Intravenous Q12H  . guaiFENesin  600 mg Oral BID  . labetalol  200 mg Oral BID  . magnesium oxide  400 mg Oral BID  . multivitamin with minerals  1 tablet Oral Daily  . mycophenolate  250 mg Oral q morning - 10a   And  . mycophenolate  500 mg  Oral QHS  . ondansetron (ZOFRAN) IV  4 mg Intravenous Q6H  . pantoprazole  40 mg Oral Daily  . potassium chloride SA  10 mEq Oral BID  . predniSONE  5 mg Oral Daily  . tacrolimus  0.5 mg Oral BID    Assessment: Patient arrives for SOB and feeling weak w/ h/o antiphospholipid syndrome w/ h/o DVTs being anticoagulated w/ warfarin PTA as follows: Warfarin 3 mg MF Warfarin 2 mg all other days See anticoagulation clinic at [facility] that has placed him on a goal INR of 2.5 - 3.0. [name] INR is subtherapeutic tonight at 1.4 and therefore patient will be bridged w/ heparin until patient can have x2 INRs within 2.5 - 3.0. baseline CBC 10 - 10.5, tonight is lower than baseline, aPTT 68 seconds.  Goal of Therapy:  INR 2.5 -3.0  Heparin level goal 0.3 - 0.7 Monitor platelets by anticoagulation protocol: Yes   Plan:  0117 1546 HL 0.6 therapeutic x 1. Will continue heparin drip at 1000 units/hr and recheck HL at 0000. CBC daily while on heparin drip.  Dorena Bodo,  PharmD Clinical Pharmacist 02/24/2019,5:29 PM

## 2019-02-24 NOTE — Progress Notes (Signed)
Brief medicine progress note.  Please see same day H&P for admission details  Briefly patient is a 69 year old male that presented to the emergency department yesterday with chief complaint of worsening dyspnea with associated dry cough and wheeze.  Subjective fever and chills over no fevers noted here in the hospital.  Chest CT done demonstrates consolidations with bilateral pleural effusions.  Patient was started on rather aggressive intravenous fluids however as clinical presentation is far more consistent with acute decompensated heart failure infectious process.  Initial Covid testing is negative.  [name] afebrile.  Procalcitonin is minimally elevated.  We will stop intravenous fluids immediately.  Patient had acute desaturation event requiring up to 6 L nasal cannula.  He was also bolused in the emergency department.  Will administer IV Lasix 60mg  x 1.  Per review of his home medication reconciliation he takes 40 mg of Lasix as needed.  The patient is not entirely aware of his medication regimen.  I will proceed with continued work-up and management of acute decompensated heart failure.  Continue Rocephin empirically for community-acquired pneumonia coverage.  Acute decompensated heart failure, likely diastolic we will administer 40 mg IV Lasix twice daily.  Daily weights.  Strict ins and outs.  2 g sodium diet with 1800 cc fluid restriction.  Recheck echocardiogram for assessment of ejection fraction.  Ralene Muskrat MD

## 2019-02-24 NOTE — ED Notes (Signed)
Pt threw up on chest.  Pt said he usually eats something before his meds. If he doesn't he gets sick. Breakfast trays were brought after med pass this morning.  Pt had incontinent bowel movements while throwing up.  Bed changed. Pt brief and chuck changed.  Pt given saltines and ginger ale.   LW EDT

## 2019-02-24 NOTE — ED Notes (Signed)
Pt turned down to 4L at this time per Admit MD on [place]. Pt maintaining 98%

## 2019-02-24 NOTE — ED Notes (Signed)
Patient transported to CT 

## 2019-02-25 ENCOUNTER — Inpatient Hospital Stay: Payer: Medicare Other

## 2019-02-25 DIAGNOSIS — R0600 Dyspnea, unspecified: Secondary | ICD-10-CM

## 2019-02-25 LAB — BASIC METABOLIC PANEL
Anion gap: 11 (ref 5–15)
BUN: 45 mg/dL — ABNORMAL HIGH (ref 8–23)
CO2: 21 mmol/L — ABNORMAL LOW (ref 22–32)
Calcium: 8.5 mg/dL — ABNORMAL LOW (ref 8.9–10.3)
Chloride: 107 mmol/L (ref 98–111)
Creatinine, Ser: 3.81 mg/dL — ABNORMAL HIGH (ref 0.61–1.24)
GFR calc Af Amer: 17 mL/min — ABNORMAL LOW (ref >60)
GFR calc non Af Amer: 15 mL/min — ABNORMAL LOW (ref >60)
Glucose, Bld: 93 mg/dL (ref 70–99)
Potassium: 3.5 mmol/L (ref 3.5–5.1)
Sodium: 139 mmol/L (ref 135–145)

## 2019-02-25 LAB — HEPARIN LEVEL (UNFRACTIONATED)
Heparin Unfractionated: 0.3 IU/mL (ref 0.30–0.70)
Heparin Unfractionated: 0.33 IU/mL (ref 0.30–0.70)
Heparin Unfractionated: 0.35 IU/mL (ref 0.30–0.70)

## 2019-02-25 LAB — CBC WITH DIFFERENTIAL/PLATELET
Abs Immature Granulocytes: 0.05 10*3/uL (ref 0.00–0.07)
Basophils Absolute: 0 10*3/uL (ref 0.0–0.1)
Basophils Relative: 0 %
Eosinophils Absolute: 0.1 10*3/uL (ref 0.0–0.5)
Eosinophils Relative: 1 %
HCT: 25.7 % — ABNORMAL LOW (ref 39.0–52.0)
Hemoglobin: 8 g/dL — ABNORMAL LOW (ref 13.0–17.0)
Immature Granulocytes: 1 %
Lymphocytes Relative: 9 %
Lymphs Abs: 0.7 10*3/uL (ref 0.7–4.0)
MCH: 27.9 pg (ref 26.0–34.0)
MCHC: 31.1 g/dL (ref 30.0–36.0)
MCV: 89.5 fL (ref 80.0–100.0)
Monocytes Absolute: 0.9 10*3/uL (ref 0.1–1.0)
Monocytes Relative: 11 %
Neutro Abs: 6.5 10*3/uL (ref 1.7–7.7)
Neutrophils Relative %: 78 %
Platelets: 120 10*3/uL — ABNORMAL LOW (ref 150–400)
RBC: 2.87 MIL/uL — ABNORMAL LOW (ref 4.22–5.81)
RDW: 13.9 % (ref 11.5–15.5)
WBC: 8.3 10*3/uL (ref 4.0–10.5)
nRBC: 0 % (ref 0.0–0.2)

## 2019-02-25 LAB — APTT: aPTT: >160 seconds (ref 24–36)

## 2019-02-25 LAB — URINE CULTURE: Culture: NO GROWTH

## 2019-02-25 LAB — PROTIME-INR
INR: 2.1 — ABNORMAL HIGH (ref 0.8–1.2)
Prothrombin Time: 23.1 seconds — ABNORMAL HIGH (ref 11.4–15.2)

## 2019-02-25 LAB — MAGNESIUM: Magnesium: 1.9 mg/dL (ref 1.7–2.4)

## 2019-02-25 MED ORDER — ORAL CARE MOUTH RINSE
15.0000 mL | Freq: Two times a day (BID) | OROMUCOSAL | Status: DC
  Administered 2019-02-26 – 2019-02-27 (×5): 15 mL via OROMUCOSAL

## 2019-02-25 MED ORDER — TECHNETIUM TO 99M ALBUMIN AGGREGATED
3.8000 | Freq: Once | INTRAVENOUS | Status: AC | PRN
  Administered 2019-02-25: 3.78 via INTRAVENOUS
  Filled 2019-02-25: qty 3.8

## 2019-02-25 MED ORDER — WARFARIN - PHARMACIST DOSING INPATIENT
Freq: Every day | Status: DC
  Filled 2019-02-25: qty 1

## 2019-02-25 MED ORDER — BENZONATATE 100 MG PO CAPS: 100.0000 mg | ORAL_CAPSULE | Freq: Two times a day (BID) | ORAL | Status: DC | PRN

## 2019-02-25 NOTE — Consult Note (Addendum)
[facility]  CONSULT NOTE    Date: 02/25/2019                  Patient Name:  Eric Gill  MRN: [id number]  DOB: [birth year]  Age / Sex: 69 y.o., male         PCP: Idelle Crouch, MD                 Service Requesting Consult: admission team                   Reason for Consult: elevated creatinine levels              History of Present Illness: Eric Gill is a 69 y.o.  male with significant medical history of left kidney transplant 2001, stroke,TIA, HTN, hyperlipidemia, DVT, PAD, vascular dementia, hyperparathyroidism, who was admitted to [facility] on 02/24/2019 for atypical PNA process versus developing pulmonary edema. Patient was alert and oriented x4 and did not show signs of distress. Patient was placed 2L of o2 breathing comfortably, tolerating food and liquids without issue and has a foley catheter in place which has produced 1,400 mL.  Patient denies HA, chills, SOB, chest pain. Nausea, vomiting, diarhea and no changes in his urine out put. He had a CT angio of the abdomen on 02/19/2019.   Medications: Outpatient medications: (Not in a hospital admission)   Current medications: Current Facility-Administered Medications  Medication Dose Route Frequency Provider Last Rate Last Admin  . ALPRAZolam Duanne Moron) tablet 0.25 mg  0.25 mg Oral BID PRN Mansy, Jan A, MD      . amLODipine (NORVASC) tablet 10 mg  10 mg Oral Daily Mansy, Jan A, MD   10 mg at 02/25/19 1018  . azithromycin (ZITHROMAX) 500 mg in sodium chloride 0.9 % 250 mL IVPB  500 mg Intravenous Q24H Mansy, Arvella Merles, MD   Stopped at 02/25/19 0355  . buPROPion (WELLBUTRIN XL) 24 hr tablet 300 mg  300 mg Oral Daily Mansy, Jan A, MD   300 mg at 02/25/19 1018  . calcitRIOL (ROCALTROL) capsule 0.25 mcg  0.25 mcg Oral QHS Mansy, Jan A, MD   0.25 mcg at 02/24/19 2236  . cefTRIAXone (ROCEPHIN) 2 g in sodium chloride 0.9 % 100 mL IVPB  2 g Intravenous Q24H Mansy, Jan A, MD 200 mL/hr at 02/25/19 1028 2 g  at 02/25/19 1028  . citalopram (CELEXA) tablet 20 mg  20 mg Oral Daily Mansy, Jan A, MD   20 mg at 02/25/19 1013  . dicyclomine (BENTYL) capsule 10 mg  10 mg Oral TID [facility] Mansy, Jan A, MD   10 mg at 02/25/19 1015  . diphenoxylate-atropine (LOMOTIL) 2.5-0.025 MG per tablet 1 tablet  1 tablet Oral QID PRN Mansy, Jan A, MD      . donepezil (ARICEPT) tablet 10 mg  10 mg Oral QHS Mansy, Jan A, MD   10 mg at 02/24/19 2236  . fluorouracil (EFUDEX) 5 % cream   Topical BID Mansy, Jan A, MD      . guaiFENesin [facility]) 12 hr tablet 600 mg  600 mg Oral BID Mansy, Jan A, MD   600 mg at 02/25/19 1014  . heparin ADULT infusion 100 units/mL (25000 units/239mL sodium chloride 0.45%)  1,050 Units/hr Intravenous Continuous Sreenath, Sudheer B, MD 10.5 mL/hr at 02/25/19 0202 1,050 Units/hr at 02/25/19 0202  . labetalol (NORMODYNE) injection 20 mg  20 mg Intravenous Q3H PRN Mansy, Jan  A, MD   20 mg at 02/24/19 0309  . labetalol (NORMODYNE) tablet 200 mg  200 mg Oral BID Mansy, Jan A, MD   200 mg at 02/25/19 1012  . magnesium oxide (MAG-OX) tablet 400 mg  400 mg Oral BID Mansy, Jan A, MD   400 mg at 02/24/19 2237  . multivitamin with minerals tablet 1 tablet  1 tablet Oral Daily Mansy, Jan A, MD   1 tablet at 02/25/19 1015  . mycophenolate (CELLCEPT) capsule 250 mg  250 mg Oral q morning - 10a Mansy, Jan A, MD   250 mg at 02/25/19 1012   And  . mycophenolate (CELLCEPT) capsule 500 mg  500 mg Oral QHS Mansy, Jan A, MD   500 mg at 02/24/19 2236  . ondansetron (ZOFRAN) injection 4 mg  4 mg Intravenous Q6H Sreenath, Sudheer B, MD      . pantoprazole (PROTONIX) EC tablet 40 mg  40 mg Oral Daily Mansy, Jan A, MD   40 mg at 02/25/19 1015  . potassium chloride SA (KLOR-CON) CR tablet 10 mEq  10 mEq Oral BID Ralene Muskrat B, MD   10 mEq at 02/25/19 1014  . predniSONE (DELTASONE) tablet 5 mg  5 mg Oral Daily Mansy, Jan A, MD   5 mg at 02/25/19 1014  . sodium chloride (OCEAN) 0.65 % nasal spray 1 spray  1 spray Each Nare PRN  Sreenath, Sudheer B, MD      . tacrolimus (PROGRAF) capsule 0.5 mg  0.5 mg Oral BID Mansy, Jan A, MD   0.5 mg at 02/25/19 1013  . technetium albumin aggregated (MAA) injection solution 3.8 millicurie  3.8 millicurie Intravenous Once PRN Delanna Ahmadi, MD      . Warfarin - Pharmacist Dosing Inpatient   Does not apply [id number] Lavina Hamman, MD       Current Outpatient Medications  Medication Sig Dispense Refill  . ALPRAZolam (XANAX) 0.25 MG tablet Take 0.25 mg by mouth 2 (two) times daily as needed.    [name] amLODipine (NORVASC) 10 MG tablet Take 10 mg by mouth daily.    . budesonide-formoterol (SYMBICORT) 80-4.5 MCG/ACT inhaler Inhale 2 puffs into the lungs 2 (two) times daily.    [name] buPROPion (WELLBUTRIN XL) 300 MG 24 hr tablet Take 300 mg by mouth daily.    . calcitRIOL (ROCALTROL) 0.25 MCG capsule Take 0.25 mcg by mouth at bedtime.    . citalopram (CELEXA) 20 MG tablet Take 20 mg by mouth daily.    [name] dicyclomine (BENTYL) 10 MG capsule Take 10 mg by mouth 3 (three) times daily before meals.     . diphenoxylate-atropine (LOMOTIL) 2.5-0.025 MG tablet Take 1 tablet by mouth 4 (four) times daily as needed for diarrhea or loose stools.    . donepezil (ARICEPT) 10 MG tablet Take 10 mg by mouth at bedtime.    . fluorouracil (EFUDEX) 5 % cream     . furosemide (LASIX) 40 MG tablet Take 40 mg by mouth daily as needed.     . labetalol (NORMODYNE) 200 MG tablet Take 200 mg by mouth 2 (two) times daily.     . magnesium oxide (MAG-OX) 400 MG tablet TAKE ONE TABLET BY MOUTH TWICE DAILY    . Magnesium Oxide 400 (240 Mg) MG TABS Take 1 tablet by mouth 2 (two) times daily.    . Multiple Vitamin (MULTI-VITAMINS) TABS Take 1 tablet by mouth daily.     . mycophenolate (CELLCEPT) 250 MG capsule  Take 250-500 mg by mouth 2 (two) times daily. 250 mg in the am and 500 mg at bedtime    . pantoprazole (PROTONIX) 40 MG tablet Take 1 tablet (40 mg total) by mouth 2 (two) times daily before a meal. (Patient taking  differently: Take 40 mg by mouth daily. ) 60 tablet 0  . potassium chloride (K-DUR) 10 MEQ tablet Take 10 mEq by mouth 2 (two) times daily.    . predniSONE (DELTASONE) 5 MG tablet Take 5 mg by mouth daily.    . tacrolimus (PROGRAF) 0.5 MG capsule Take 1 capsule by mouth 2 (two) times a day.    . warfarin (JANTOVEN) 1 MG tablet Take 2-3 mg by mouth daily. Take two tablets (2mg ) on Sunday, Tuesday,Weds, Thursday and Saturday. Take three tablets (3 mg) on Monday and Friday.        Allergies: Allergies  Allergen Reactions  . Losartan Cough  . Statins Nausea And Vomiting and Other (See Comments)    Reaction:  Muscle pain       Past Medical History: Past Medical History:  Diagnosis Date  . Anemia   . Antiphospholipid antibody syndrome ([place])   . Arthritis   . DVT (deep venous thrombosis) ([place])   . Essential hypertension   . Gastric ulcer   . GERD (gastroesophageal reflux disease)   . History of CMV   . Hyperlipidemia   . Hyperparathyroidism ([place])   . Pedal edema   . Peripheral vascular disease ([place])   . Rosacea   . Status post kidney transplant 2001  . Stroke ([place])   . TIA (transient ischemic attack)   . Vascular dementia without behavioral disturbance [facility])      Past Surgical History: Past Surgical History:  Procedure Laterality Date  . CHOLECYSTECTOMY    . COLONOSCOPY WITH PROPOFOL N/A 03/06/2015   Procedure: COLONOSCOPY WITH PROPOFOL;  Surgeon: Josefine Class, MD;  Location[facility] ENDOSCOPY;  Service: Endoscopy;  Laterality: N/A;  . ELBOW SURGERY    . ESOPHAGOGASTRODUODENOSCOPY Left 03/01/2015   Procedure: ESOPHAGOGASTRODUODENOSCOPY (EGD);  Surgeon: Hulen Luster, MD;  Location: [facility] ENDOSCOPY;  Service: Endoscopy;  Laterality: Left;  . ESOPHAGOGASTRODUODENOSCOPY (EGD) WITH PROPOFOL  03/06/2015   Procedure: ESOPHAGOGASTRODUODENOSCOPY (EGD) WITH PROPOFOL;  Surgeon: Josefine Class, MD;  Location: [facility] ENDOSCOPY;  Service: Endoscopy;;  . ESOPHAGOGASTRODUODENOSCOPY (EGD)  WITH PROPOFOL N/A 05/25/2015   Procedure: ESOPHAGOGASTRODUODENOSCOPY (EGD) WITH PROPOFOL;  Surgeon: Josefine Class, MD;  Location: [facility] ENDOSCOPY;  Service: Endoscopy;  Laterality: N/A;  . KIDNEY TRANSPLANT    . KNEE ARTHROSCOPY       Family History: No family history on file.   Social History: Social History   Socioeconomic History  . Marital status: Married    Spouse name: Not on file  . Number of children: Not on file  . Years of education: Not on file  . Highest education level: Not on file  Occupational History  . Not on file  Tobacco Use  . Smoking status: Former Smoker    Packs/day: 1.00    Years: 30.00    Pack years: 30.00    Types: Cigarettes    Quit date: 1999    Years since quitting: 22.0  . Smokeless tobacco: Never Used  Substance and Sexual Activity  . Alcohol use: No  . Drug use: No  . Sexual activity: Not on file  Other Topics Concern  . Not on file  Social History Narrative  . Not on file   Social Determinants  of Health   Financial Resource Strain:   . Difficulty of Paying Living Expenses: Not on file  Food Insecurity:   . Worried About Charity fundraiser in the Last Year: Not on file  . Ran Out of Food in the Last Year: Not on file  Transportation Needs:   . Lack of Transportation (Medical): Not on file  . Lack of Transportation (Non-Medical): Not on file  Physical Activity:   . Days of Exercise per Week: Not on file  . Minutes of Exercise per Session: Not on file  Stress:   . Feeling of Stress : Not on file  Social Connections:   . Frequency of Communication with Friends and Family: Not on file  . Frequency of Social Gatherings with Friends and Family: Not on file  . Attends Religious Services: Not on file  . Active Member of Clubs or Organizations: Not on file  . Attends Archivist Meetings: Not on file  . Marital Status: Not on file  Intimate Partner Violence:   . Fear of Current or Ex-Partner: Not on file  .  Emotionally Abused: Not on file  . Physically Abused: Not on file  . Sexually Abused: Not on file     Review of Systems: Review of Systems  Constitutional: Positive for fever. Negative for malaise/fatigue and weight loss.  HENT: Positive for congestion. Negative for ear pain, hearing loss and sore throat.   Eyes: Negative for blurred vision, pain, discharge and redness.  Respiratory: Positive for cough, sputum production and shortness of breath. Negative for hemoptysis and wheezing.   Cardiovascular: Negative for chest pain, palpitations and leg swelling.  Gastrointestinal: Negative for abdominal pain, blood in stool, constipation, diarrhea, heartburn, melena, nausea and vomiting.  Genitourinary: Negative for dysuria, flank pain, frequency, hematuria and urgency.  Musculoskeletal: Negative for back pain, falls, joint pain, myalgias and neck pain.  Skin: Negative for rash.  Neurological: Negative for dizziness, sensory change, speech change, focal weakness, weakness and headaches.  Endo/Heme/Allergies: Does not bruise/bleed easily.  Psychiatric/Behavioral: Negative for depression.  All other systems reviewed and are negative.   Vital Signs: Blood pressure (!) 154/71, pulse 79, temperature 97.8 F (36.6 C), temperature source Oral, resp. rate 18, height 5\' 3"  (1.6 m), weight 63.5 kg, SpO2 95 %.  Weight trends: Filed Weights   02/24/19 0018 02/25/19 0500  Weight: 63.5 kg 63.5 kg    Physical Exam: General: Appeared stated age and showed no signs of distress   Head: Normocephalic, atraumatic. Moist oral mucosal membranes  Eyes: Anicteric  Neck: Supple, trachea midline  Lungs:  diminished breath sounds with bilateral basilar rales   Heart: Regular rate and rhythm  Abdomen:  Soft, nontender,   Extremities:  no peripheral edema noticed.  Neurologic: Nonfocal, moving all four extremities  Skin: Bandage on the left side of forehead   Access: IV access on the right arm     Lab  results: Basic Metabolic Panel: Recent Labs  Lab 02/24/19 0020 02/24/19 0440 02/25/19 0511  NA 137 136 139  K 4.5 4.3 3.5  CL 106 108 107  CO2 23 23 21*  GLUCOSE 101* 94 93  BUN 45* 45* 45*  CREATININE 4.07* 3.66* 3.81*  CALCIUM 9.2 8.2* 8.5*  MG  --   --  1.9    Liver Function Tests: Recent Labs  Lab 02/24/19 0020  AST 19  ALT 13  ALKPHOS 100  BILITOT 1.2  PROT 7.1  ALBUMIN 3.8   No results  for input(s): LIPASE, AMYLASE in the last 168 hours. No results for input(s): AMMONIA in the last 168 hours.  CBC: Recent Labs  Lab 02/24/19 0020 02/24/19 0440 02/25/19 0511  WBC 8.7 9.2 8.3  NEUTROABS 7.0 7.3 6.5  HGB 9.8* 8.1* 8.0*  HCT 31.8* 26.5* 25.7*  MCV 91.1 91.1 89.5  PLT 141* 113* 120*    Cardiac Enzymes: No results for input(s): CKTOTAL, CKMB, CKMBINDEX, TROPONINI in the last 168 hours.  BNP: Invalid input(s): POCBNP  CBG: No results for input(s): GLUCAP in the last 168 hours.  Microbiology: Results for orders placed or performed during the hospital encounter of 02/24/19  Blood Culture (routine x 2)     Status: None (Preliminary result)   Collection Time: 02/24/19 12:20 AM   Specimen: BLOOD  Result Value Ref Range Status   Specimen Description BLOOD LEFT FOREARM  Final   Special Requests   Final    BOTTLES DRAWN AEROBIC AND ANAEROBIC Blood Culture results may not be optimal due to an excessive volume of blood received in culture bottles   Culture   Final    NO GROWTH 1 DAY Performed at [facility], [address] [place], [place] [address]    Report Status PENDING  Incomplete  Blood Culture (routine x 2)     Status: None (Preliminary result)   Collection Time: 02/24/19 12:20 AM   Specimen: BLOOD  Result Value Ref Range Status   Specimen Description BLOOD  Final   Special Requests   Final    BOTTLES DRAWN AEROBIC AND ANAEROBIC Blood Culture results may not be optimal due to an excessive volume of blood received in culture bottles    Culture   Final    NO GROWTH 1 DAY Performed at [facility], [address], [place], [place] [address]    Report Status PENDING  Incomplete  Urine culture     Status: None   Collection Time: 02/24/19 12:20 AM   Specimen: In/Out Cath Urine  Result Value Ref Range Status   Specimen Description   Final    IN/OUT CATH URINE Performed at [facility], [address] [place], [place] [address]    Special Requests   Final    NONE Performed at [facility], [address] [place], [place] [address]    Culture   Final    NO GROWTH Performed at [facility], [place] [address], [place], [place] [address]    Report Status 02/25/2019 FINAL  Final  Respiratory Panel by RT PCR (Flu A&B, Covid) - Nasopharyngeal Swab     Status: None   Collection Time: 02/24/19 12:20 AM   Specimen: Nasopharyngeal Swab  Result Value Ref Range Status   SARS Coronavirus 2 by RT PCR NEGATIVE NEGATIVE Final    Comment: (NOTE) SARS-CoV-2 target nucleic acids are NOT DETECTED. The SARS-CoV-2 RNA is generally detectable in upper respiratoy specimens during the acute phase of infection. The lowest concentration of SARS-CoV-2 viral copies this assay can detect is 131 copies/mL. A negative result does not preclude SARS-Cov-2 infection and should not be used as the sole basis for treatment or other patient management decisions. A negative result may occur with  improper specimen collection/handling, submission of specimen other than nasopharyngeal swab, presence of viral mutation(s) within the areas targeted by this assay, and inadequate number of viral copies (<131 copies/mL). A negative result must be combined with clinical observations, patient history, and epidemiological information. The expected result is Negative.  Fact Sheet for Patients:  PinkCheek.be Fact Sheet for Healthcare Providers:  GravelBags.it This test is  not yet ap proved or cleared by the [place] FDA and  has been authorized for detection and/or diagnosis of SARS-CoV-2 by FDA under an Emergency Use Authorization (EUA). This EUA will remain  in effect (meaning this test can be used) for the duration of the COVID-19 declaration under Section 564(b)(1) of the Act, 21 U.S.C. section 360bbb-3(b)(1), unless the authorization is terminated or revoked sooner.    Influenza A by PCR NEGATIVE NEGATIVE Final   Influenza B by PCR NEGATIVE NEGATIVE Final    Comment: (NOTE) The Xpert Xpress SARS-CoV-2/FLU/RSV assay is intended as an aid in  the diagnosis of influenza from Nasopharyngeal swab specimens and  should not be used as a sole basis for treatment. Nasal washings and  aspirates are unacceptable for Xpert Xpress SARS-CoV-2/FLU/RSV  testing. Fact Sheet for Patients: PinkCheek.be Fact Sheet for Healthcare Providers: GravelBags.it This test is not yet approved or cleared by the [place] FDA and  has been authorized for detection and/or diagnosis of SARS-CoV-2 by  FDA under an Emergency Use Authorization (EUA). This EUA will remain  in effect (meaning this test can be used) for the duration of the  Covid-19 declaration under Section 564(b)(1) of the Act, 21  U.S.C. section 360bbb-3(b)(1), unless the authorization is  terminated or revoked. Performed at [facility], [address] [place], [place] [address]     Coagulation Studies: Recent Labs    02/24/19 0020 02/25/19 0511  LABPROT 17.5* 23.1*  INR 1.4* 2.1*    Urinalysis: Recent Labs    02/24/19 0020  COLORURINE YELLOW*  LABSPEC 1.013  PHURINE 6.0  GLUCOSEU NEGATIVE  HGBUR SMALL*  BILIRUBINUR NEGATIVE  KETONESUR NEGATIVE  PROTEINUR 100*  NITRITE NEGATIVE  LEUKOCYTESUR NEGATIVE      Imaging: CT CHEST WO CONTRAST  Result Date: 02/24/2019 CLINICAL DATA:  Shortness of breath. Hypoxia. Fever.  EXAM: CT CHEST WITHOUT CONTRAST TECHNIQUE: Multidetector CT imaging of the chest was performed following the standard protocol without IV contrast. COMPARISON:  One-view chest x-ray 02/24/2019 FINDINGS: Cardiovascular: Heart size is normal. Coronary artery calcifications are present. No significant pericardial effusion is present. Atherosclerotic changes are noted at the aortic arch and origins of the great vessels. There is no aneurysm or significant stenosis. Pulmonary artery size is within. Mediastinum/Nodes: Subcentimeter mediastinal lymph nodes are likely reactive. Lungs/Pleura: Bilateral pleural effusions are present. Bibasilar airspace consolidation is present in the lower lobes, right greater than left. Additional right middle lobe and upper lobe airspace opacities are present, consistent with pneumonia. Bronchi are patent. Left upper lobe is clear. Upper Abdomen: The patient is status post cholecystectomy. Liver and spleen are within normal limits. Kidneys are severely atrophic bilaterally. Acute abnormalities are present. Musculoskeletal: Vertebral body heights and alignment are maintained. No focal lytic or blastic lesions are present. IMPRESSION: 1. Right middle and lower lobe airspace disease compatible with pneumonia. 2. Bilateral pleural effusions, right greater than left. 3. Coronary artery disease. 4. Severe bilateral renal atrophy. Electronically Signed   By: San Morelle M.D.   On: 02/24/2019 06:51   US RENAL  Result Date: 02/24/2019 CLINICAL DATA:  Acute kidney injury. EXAM: RENAL / URINARY TRACT ULTRASOUND COMPLETE COMPARISON:  October 17, 2018. FINDINGS: Right Kidney: Renal measurements: 5.0 x 2.5 x 2.5 cm = volume: 16 mL. Increased echogenicity is noted consistent with medical renal disease. No mass or hydronephrosis visualized. Left Kidney: Not visualized. Bladder: Appears  normal for degree of bladder distention. Other: Renal transplant is noted in left lower quadrant. No  definite hydronephrosis is noted. It measures 9.0 x 5.2 x 5.0 cm with calculated size of 122 mL. Grossly normal echogenicity of renal parenchyma is noted. IMPRESSION: Renal transplant is noted in left lower quadrant. No hydronephrosis is noted. It appears grossly normal in appearance and size. Severely atrophic right kidney is noted consistent with end-stage renal disease. Left kidney is not visualized. Electronically Signed   By: Marijo Conception M.D.   On: 02/24/2019 16:22   DG Chest Port 1 View  Result Date: 02/24/2019 CLINICAL DATA:  Fever and shortness of breath EXAM: PORTABLE CHEST 1 VIEW COMPARISON:  10/21/2018 FINDINGS: There are scattered airspace opacities bilaterally, greatest within the right lower and right mid lung zone. The heart size is enlarged. Prominent interstitial lung markings are noted bilaterally. There is no pneumothorax. There may be small bilateral pleural effusions. There is no acute osseous abnormality. Aortic calcifications are noted. IMPRESSION: Prominent bilateral interstitial lung markings with focal airspace opacities in the right mid and right lower lung zone is concerning for an atypical infectious process versus developing pulmonary edema. Cardiomegaly. Electronically Signed   By: Constance Holster M.D.   On: 02/24/2019 00:42      Assessment & Plan: Eric Gill is a 69 y.o.  male with , who was admitted to [facility] on 02/24/2019 for atypical PNA  versus developing pulmonary edema. Patient has  significant medical history of left from a living non related donor (wife) kidney transplant 2001 which he takes immunosuppression( tacrolimus, prednisone and mycophenolate) , stroke,TIA, HTN, hyperlipidemia, DVT, PAD, vascular dementia, hyperparathyroidism. likely patient is experiencing AKI elevated creatinine of 3.81 (base 2.71 09/20) and BUN of 45   1. AKI- elevated creatinine of 3.81 (base 2.71 09/20) and BUN of 45  secondary to recent IV contrast use on 01/12 combined  with current infection and possible CHF excerbation.  - stop home dose of laxis  - give IV bolus for hypotension only and monitor hydration status  -repeat renal panel daily    2. Post status left Kidney transplant (2001) , stage IV CKD - Continue home dose of  immunosuppression( tacrolimus, prednisone and mycophenolate)   3. Anemia- normocytic anemia likely from chronic kidney disease  - continue to monitor closely -give one dose of EPO  4. Shortness of breath  atypical PNA  versus developing pulmonary edema. - continue antibiotic avoid nephrotoxic medications   -  pending echo   LOS: [place] 1/18/202111:44 AM   Patient was seen and examined with PA Student Mr Deno Etienne. Case discussed in detail and note reviewed. I Agree with the note including Assessment and plan as above.  Case dicussed with Dr Berle Mull Will follow

## 2019-02-25 NOTE — ED Notes (Signed)
Called patient's wife and notified her of patient's transfer to room 230.  Understanding verbalized.

## 2019-02-25 NOTE — Progress Notes (Signed)
[place] for heparin/warfarin Indication: antiphospholipid syndrome w/ h/o DVTs  Allergies  Allergen Reactions  . Losartan Cough  . Statins Nausea And Vomiting and Other (See Comments)    Reaction:  Muscle pain     Patient Measurements: Height: 5\' 3"  (160 cm) Weight: 139 lb 15.9 oz (63.5 kg) IBW/kg (Calculated) : 56.9 Heparin Dosing Weight: 63.5 kg  Vital Signs: Temp: 97.8 F (36.6 C) (01/18 0800) Temp Source: Oral (01/18 0800) BP: 154/71 (01/18 0500) Pulse Rate: 79 (01/18 0500)  Labs: Recent Labs    02/24/19 0020 02/24/19 0020 02/24/19 0440 02/24/19 1546 02/25/19 0010 02/25/19 0511 02/25/19 1413  HGB 9.8*   < > 8.1*  --   --  8.0*  --   HCT 31.8*  --  26.5*  --   --  25.7*  --   PLT 141*  --  113*  --   --  120*  --   APTT 68*  --   --   --   --  >160*  --   LABPROT 17.5*  --   --   --   --  23.1*  --   INR 1.4*  --   --   --   --  2.1*  --   HEPARINUNFRC  --   --   --  0.60 0.30  --  0.35  CREATININE 4.07*  --  3.66*  --   --  3.81*  --    < > = values in this interval not displayed.    Estimated Creatinine Clearance: 14.5 mL/min (A) (by C-G formula based on SCr of 3.81 mg/dL (H)).   Medical History: Past Medical History:  Diagnosis Date  . Anemia   . Antiphospholipid antibody syndrome ([place])   . Arthritis   . DVT (deep venous thrombosis) ([place])   . Essential hypertension   . Gastric ulcer   . GERD (gastroesophageal reflux disease)   . History of CMV   . Hyperlipidemia   . Hyperparathyroidism ([place])   . Pedal edema   . Peripheral vascular disease ([place])   . Rosacea   . Status post kidney transplant 2001  . Stroke ([place])   . TIA (transient ischemic attack)   . Vascular dementia without behavioral disturbance (HCC)     Medications:  Scheduled:  . amLODipine  10 mg Oral Daily  . buPROPion  300 mg Oral Daily  . calcitRIOL  0.25 mcg Oral QHS  . citalopram  20 mg Oral Daily  . dicyclomine  10 mg Oral TID AC  .  donepezil  10 mg Oral QHS  . fluorouracil   Topical BID  . guaiFENesin  600 mg Oral BID  . labetalol  200 mg Oral BID  . magnesium oxide  400 mg Oral BID  . multivitamin with minerals  1 tablet Oral Daily  . mycophenolate  250 mg Oral q morning - 10a   And  . mycophenolate  500 mg Oral QHS  . ondansetron (ZOFRAN) IV  4 mg Intravenous Q6H  . pantoprazole  40 mg Oral Daily  . potassium chloride SA  10 mEq Oral BID  . predniSONE  5 mg Oral Daily  . tacrolimus  0.5 mg Oral BID  . Warfarin - Pharmacist Dosing Inpatient   Does not apply q1800    Assessment: Patient arrives for SOB and feeling weak w/ h/o antiphospholipid syndrome w/ h/o DVTs being anticoagulated w/ warfarin PTA as follows: Warfarin 3 mg MF  Warfarin 2 mg all other days See anticoagulation clinic at [facility] that has placed him on a goal INR of 2.5 - 3.0. [name] INR is subtherapeutic tonight at 1.4 and therefore patient will be bridged w/ heparin until patient can have x2 INRs within 2.5 - 3.0. baseline CBC 10 - 10.5, tonight is lower than baseline, aPTT 68 seconds.  Heparin Course: 1/17 1546 HL 0.60, therapeutic x 1 1/18 0000 HL 0.30, rate increased to 1050 units/hr 1/18 1413 HL 0.35, therapeutic x 1  Warfarin Course: 1/17 INR 1.4, warfarin 3mg  given 1/18 INR 2.1  Goal of Therapy:  INR 2.5 -3.0  Heparin level goal 0.3 - 0.7 Monitor platelets by anticoagulation protocol: Yes   Plan:  1/18 1416 HL 0.35, therapeutic x 1 since rate change, will continue with current rate of 1050 units/hr. Will recheck HL in 8 hours.   1/18 INR 2.1 this morning. Patient received warfarin 3mg  on 1/17 and INR increased from 1.4 to 2.1. Will hold on warfarin dose this evening since INR jumped up so quickly. Will check INR with AM labs.  Hgb/Hct are trending down, will continue to follow closely. Daily CBC with AM labs per protocol.  Paulina Fusi, PharmD, BCPS 02/25/2019 3:02 PM

## 2019-02-25 NOTE — Progress Notes (Signed)
[facility] Progress Note  Patient: Eric Gill    [id number]  DOA: 02/24/2019     Date of Service: the patient was seen and examined on 02/25/2019  Chief Complaint  Patient presents with  . Shortness of Breath  . Fever   Brief hospital course: Eric Gill  is a 69 y.o. male with a known history of multiple medical problems that are mentioned below, who presented to the emergency room with onset of worsening dyspnea since yesterday with associated dry cough and wheezing.  He admitted to fever and chills.  Not any chest pain or palpitations.  No nausea vomiting or diarrhea or abdominal pain.  When asked about COVID-19 exposure he stated that he was in the emergency room last week and stayed about 13 hours.  He denied any bleeding diathesis.  No dysuria, oliguria or hematuria or flank pain.  Upon presentation to the emergency room, blood pressure was 189/90 with a pulse 104 respiratory 24 and pulse which was 9 9% on her percent nonrebreather with a temperature of 98.7.  It was then tapered to 6 L of O2 by nasal cannula.  Labs were remarkable for a BUN of 45 and creatinine 4.07 compared to 37 and 3 on 10/30/2018.  Is LDH was 243 and ferritin 303.  Lactic acid 0.9 procalcitonin 0.17 and CBC showed anemia that is close to his previous levels.  Urinalysis came back negative.  Blood cultures were drawn.  Portable chest x-ray showed cardiomegaly and prominent bilateral interstitial lung markings with focal airspace opacities in the right mid and right lower lung zone is concerning for an atypical infectious process versus developing pulmonary edema.  Patient was given 1 g of p.o. Tylenol, IV cefepime, vancomycin and Flagyl and 1 L IV normal saline.  The patient will be admitted to medical monitored bed for further evaluation and management.  Currently further plan is continue to treat pneumonia.  Assessment and Plan: 1.  Community-acquired pneumonia Acute hypoxic respiratory failure  Continue with IV fluids continue with Rocephin and azithromycin.  Follow-up on cultures. COVID-19 is negative. Supportive measures with Tessalon Perles  2.  Acute kidney injury. Chronic kidney disease stage IV. History of renal transplant Immunosuppressed Appreciate nephrology input. Recommend continuing Prograf CellCept and prednisone at current dose. Acute kidney injury likely in the setting of pneumonia. Does not appear to have any volume overload right now. No further diuresis for now. We will continue to monitor daily in and out and daily weight  3.  Elevated BNP Continue to monitor for now. Recognizes all in the setting of pneumonia.  Monitor.  4.  Essential hypertension. Continue blood pressure medications.  5.  History of depression Currently no suicidal homicidal ideation. Stable. Continue Celexa  6.  History of antiphospholipid syndrome Chronic anticoagulation with warfarin [name] on warfarin.  Currently INR subtherapeutic. Bridging the patient with therapeutic IV heparin while INR is subtherapeutic.  Pharmacy monitoring INR.  Diet: Renal diet DVT Prophylaxis: Therapeutic Anticoagulation with Heparin   Advance goals of care discussion: Full code  Family Communication: no family was present at bedside, at the time of interview.   Disposition:  Pt is from home, admitted with confusion, cough and shortness of breath diagnosed with pneumonia and acute kidney injury, still has acute kidney injury and hypoxia, which precludes a safe discharge. Discharge to home, when cleared by nephrology and improvement in oxygenation.  Subjective: Reports fatigue and tiredness.  Also has cough.  Also shortness of breath.  No nausea  no vomiting.  Minimal oral intake.  Physical Exam: General: alert oriented to time, place, and person.  Appear in mild distress, affect appropriate Eyes: PERRL ENT: Oral Mucosa Clear, moist  Neck: no JVD,  Cardiovascular: S1 and S2 Present, no  Murmur,  Respiratory: good respiratory effort, Bilateral Air entry equal and Decreased, bilateral  Crackles, no wheezes Abdomen: Bowel Sound present, Soft and no tenderness,  Skin: no rash Extremities: no Pedal edema, no calf tenderness Neurologic: without any new focal findings  Gait not checked due to patient safety concerns  Vitals:   02/25/19 0800 02/25/19 1200 02/25/19 1606 02/25/19 1629  BP:  (!) 150/66  125/62  Pulse:  61 67 68  Resp:  16 16 16   Temp: 97.8 F (36.6 C) 97.6 F (36.4 C)  97.7 F (36.5 C)  TempSrc: Oral Oral  Oral  SpO2:  97% 100% 98%  Weight:      Height:        Intake/Output Summary (Last 24 hours) at 02/25/2019 1836 Last data filed at 02/25/2019 1629 Gross per 24 hour  Intake 520.12 ml  Output 1500 ml  Net -979.88 ml   Filed Weights   02/24/19 0018 02/25/19 0500  Weight: 63.5 kg 63.5 kg    Data Reviewed: I have personally reviewed and interpreted daily labs, tele strips, imagings as discussed above. I reviewed all nursing notes, pharmacy notes, vitals, pertinent old records I have discussed plan of care as described above with RN and patient/family.  CBC: Recent Labs  Lab 02/24/19 0020 02/24/19 0440 02/25/19 0511  WBC 8.7 9.2 8.3  NEUTROABS 7.0 7.3 6.5  HGB 9.8* 8.1* 8.0*  HCT 31.8* 26.5* 25.7*  MCV 91.1 91.1 89.5  PLT 141* 113* [id number]*   Basic Metabolic Panel: Recent Labs  Lab 02/24/19 0020 02/24/19 0440 02/25/19 0511  NA 137 136 139  K 4.5 4.3 3.5  CL 106 108 107  CO2 23 23 21*  GLUCOSE 101* 94 93  BUN 45* 45* 45*  CREATININE 4.07* 3.66* 3.81*  CALCIUM 9.2 8.2* 8.5*  MG  --   --  1.9    Studies: NM Pulmonary Perfusion  Result Date: 02/25/2019 CLINICAL DATA:  Shortness of breath, pneumonia, COVID negative, history of renal transplant EXAM: NUCLEAR MEDICINE PERFUSION LUNG SCAN TECHNIQUE: Perfusion images were obtained in multiple projections after intravenous injection of radiopharmaceutical. Ventilation scans intentionally  deferred if perfusion scan and chest x-ray adequate for interpretation during COVID 19 epidemic. RADIOPHARMACEUTICALS:  3.78 mCi Tc-77m MAA IV COMPARISON:  None Correlation: Chest radiograph and chest CT of 02/24/2019 FINDINGS: Patchy nonsegmental diminished perfusion LEFT lower lobe. Fluid within RIGHT major fissure. No other perfusion defects identified. Ventilation exam not performed. Overall, perfusion appears much better than expected based on the BILATERAL pleural effusions and lower lobe consolidation seen on CT of 02/24/2019. Findings represent a low probability for pulmonary embolism. IMPRESSION: Low probability of pulmonary embolism. Electronically Signed   By: Lavonia Dana M.D.   On: 02/25/2019 13:40    Scheduled Meds: . amLODipine  10 mg Oral Daily  . buPROPion  300 mg Oral Daily  . calcitRIOL  0.25 mcg Oral QHS  . citalopram  20 mg Oral Daily  . dicyclomine  10 mg Oral TID AC  . donepezil  10 mg Oral QHS  . fluorouracil   Topical BID  . guaiFENesin  600 mg Oral BID  . labetalol  200 mg Oral BID  . magnesium oxide  400 mg Oral BID  .  mouth rinse  15 mL Mouth Rinse BID  . multivitamin with minerals  1 tablet Oral Daily  . mycophenolate  250 mg Oral q morning - 10a   And  . mycophenolate  500 mg Oral QHS  . ondansetron (ZOFRAN) IV  4 mg Intravenous Q6H  . pantoprazole  40 mg Oral Daily  . potassium chloride SA  10 mEq Oral BID  . predniSONE  5 mg Oral Daily  . tacrolimus  0.5 mg Oral BID  . Warfarin - Pharmacist Dosing Inpatient   Does not apply q1800   Continuous Infusions: . azithromycin Stopped (02/25/19 0355)  . cefTRIAXone (ROCEPHIN)  IV Stopped (02/25/19 1057)  . heparin 1,050 Units/hr (02/25/19 0202)   PRN Meds: ALPRAZolam, benzonatate, diphenoxylate-atropine, labetalol, sodium chloride  Time spent: 35 minutes  Author: Berle Mull, MD [facility] 02/25/2019 6:36 PM  To reach On-call, see care teams to locate the attending and reach out to them via  [email or web address]. If 7PM-7AM, please contact night-coverage If you still have difficulty reaching the attending provider, please page the [facility] (Director on Call) for [facility] on amion for assistance.

## 2019-02-25 NOTE — ED Notes (Signed)
Pt sitting up eating breakfast.

## 2019-02-25 NOTE — Progress Notes (Signed)
[place] for heparin/warfarin Indication: antiphospholipid syndrome w/ h/o DVTs  Allergies  Allergen Reactions  . Losartan Cough  . Statins Nausea And Vomiting and Other (See Comments)    Reaction:  Muscle pain     Patient Measurements: Height: 5\' 3"  (160 cm) Weight: 139 lb 15.9 oz (63.5 kg) IBW/kg (Calculated) : 56.9 Heparin Dosing Weight: 63.5 kg  Vital Signs: Temp: 98.8 F (37.1 C) (01/18 2053) Temp Source: Oral (01/18 2053) BP: 143/67 (01/18 2053) Pulse Rate: 72 (01/18 2053)  Labs: Recent Labs    02/24/19 0020 02/24/19 0020 02/24/19 0440 02/24/19 1546 02/25/19 0010 02/25/19 0511 02/25/19 1413 02/25/19 2157  HGB 9.8*   < > 8.1*  --   --  8.0*  --   --   HCT 31.8*  --  26.5*  --   --  25.7*  --   --   PLT 141*  --  113*  --   --  120*  --   --   APTT 68*  --   --   --   --  >160*  --   --   LABPROT 17.5*  --   --   --   --  23.1*  --   --   INR 1.4*  --   --   --   --  2.1*  --   --   HEPARINUNFRC  --   --   --    < > 0.30  --  0.35 0.33  CREATININE 4.07*  --  3.66*  --   --  3.81*  --   --    < > = values in this interval not displayed.    Estimated Creatinine Clearance: 14.5 mL/min (A) (by C-G formula based on SCr of 3.81 mg/dL (H)).   Medical History: Past Medical History:  Diagnosis Date  . Anemia   . Antiphospholipid antibody syndrome ([place])   . Arthritis   . DVT (deep venous thrombosis) ([place])   . Essential hypertension   . Gastric ulcer   . GERD (gastroesophageal reflux disease)   . History of CMV   . Hyperlipidemia   . Hyperparathyroidism ([place])   . Pedal edema   . Peripheral vascular disease ([place])   . Rosacea   . Status post kidney transplant 2001  . Stroke ([place])   . TIA (transient ischemic attack)   . Vascular dementia without behavioral disturbance (HCC)     Medications:  Scheduled:  . amLODipine  10 mg Oral Daily  . buPROPion  300 mg Oral Daily  . calcitRIOL  0.25 mcg Oral QHS  . citalopram   20 mg Oral Daily  . dicyclomine  10 mg Oral TID AC  . donepezil  10 mg Oral QHS  . fluorouracil   Topical BID  . guaiFENesin  600 mg Oral BID  . labetalol  200 mg Oral BID  . magnesium oxide  400 mg Oral BID  . mouth rinse  15 mL Mouth Rinse BID  . multivitamin with minerals  1 tablet Oral Daily  . mycophenolate  250 mg Oral q morning - 10a   And  . mycophenolate  500 mg Oral QHS  . ondansetron (ZOFRAN) IV  4 mg Intravenous Q6H  . pantoprazole  40 mg Oral Daily  . potassium chloride SA  10 mEq Oral BID  . predniSONE  5 mg Oral Daily  . tacrolimus  0.5 mg Oral BID  . Warfarin - Pharmacist  Dosing Inpatient   Does not apply q1800    Assessment: Patient arrives for SOB and feeling weak w/ h/o antiphospholipid syndrome w/ h/o DVTs being anticoagulated w/ warfarin PTA as follows: Warfarin 3 mg MF Warfarin 2 mg all other days See anticoagulation clinic at [facility] that has placed him on a goal INR of 2.5 - 3.0. [name] INR is subtherapeutic tonight at 1.4 and therefore patient will be bridged w/ heparin until patient can have x2 INRs within 2.5 - 3.0. baseline CBC 10 - 10.5, tonight is lower than baseline, aPTT 68 seconds.  Heparin Course: 1/17 1546 HL 0.60, therapeutic x 1 1/18 0000 HL 0.30, rate increased to 1050 units/hr 1/18 1413 HL 0.35, therapeutic x 1  Warfarin Course: 1/17 INR 1.4, warfarin 3mg  given 1/18 INR 2.1  Goal of Therapy:  INR 2.5 -3.0  Heparin level goal 0.3 - 0.7 Monitor platelets by anticoagulation protocol: Yes   Plan:  1/18 1416 HL 0.35, therapeutic x 1 since rate change, will continue with current rate of 1050 units/hr. Will recheck HL in 8 hours.  1/18 2157 HL 0.33, therapeutic x 2.  Continue current rate of 1050 units/hr and recheck HL daily in am   1/18 INR 2.1 this morning. Patient received warfarin 3mg  on 1/17 and INR increased from 1.4 to 2.1. Will hold on warfarin dose this evening since INR jumped up so quickly. Will check INR with AM  labs.  Hgb/Hct are trending down, will continue to follow closely. Daily CBC with AM labs per protocol.  Hart Robinsons, PharmD Clinical Pharmacist   02/25/2019 10:34 PM

## 2019-02-25 NOTE — Progress Notes (Signed)
Was called by lab for critical aptt level of >160. Notified Pharmacy and Sharion Settler, NP. Pharmacy decided that more than likely sample was hemolyzed and will keep Heparin drip as is and do a redraw at 0800 with heparin level.

## 2019-02-25 NOTE — Care Management (Signed)
TOC CM: Patient pending SNF placement-PASSR completed-[phone] A. Will complete fL2 and send bed search when closer to discharge. Pending hospital admission at this time.

## 2019-02-25 NOTE — Progress Notes (Signed)
[place] for heparin/warfarin Indication: antiphospholipid syndrome w/ h/o DVTs  Allergies  Allergen Reactions  . Losartan Cough  . Statins Nausea And Vomiting and Other (See Comments)    Reaction:  Muscle pain     Patient Measurements: Height: 5\' 3"  (160 cm) Weight: 140 lb (63.5 kg) IBW/kg (Calculated) : 56.9 Heparin Dosing Weight: 63.5 kg  Vital Signs: Temp: 98.7 F (37.1 C) (01/17 2126) Temp Source: Oral (01/17 2126) BP: 144/60 (01/18 0100) Pulse Rate: 75 (01/18 0100)  Labs: Recent Labs    02/24/19 0020 02/24/19 0440 02/24/19 1546 02/25/19 0010  HGB 9.8* 8.1*  --   --   HCT 31.8* 26.5*  --   --   PLT 141* 113*  --   --   APTT 68*  --   --   --   LABPROT 17.5*  --   --   --   INR 1.4*  --   --   --   HEPARINUNFRC  --   --  0.60 0.30  CREATININE 4.07* 3.66*  --   --     Estimated Creatinine Clearance: 15.1 mL/min (A) (by C-G formula based on SCr of 3.66 mg/dL (H)).   Medical History: Past Medical History:  Diagnosis Date  . Anemia   . Antiphospholipid antibody syndrome ([place])   . Arthritis   . DVT (deep venous thrombosis) ([place])   . Essential hypertension   . Gastric ulcer   . GERD (gastroesophageal reflux disease)   . History of CMV   . Hyperlipidemia   . Hyperparathyroidism ([place])   . Pedal edema   . Peripheral vascular disease ([place])   . Rosacea   . Status post kidney transplant 2001  . Stroke ([place])   . TIA (transient ischemic attack)   . Vascular dementia without behavioral disturbance (HCC)     Medications:  Scheduled:  . amLODipine  10 mg Oral Daily  . buPROPion  300 mg Oral Daily  . calcitRIOL  0.25 mcg Oral QHS  . citalopram  20 mg Oral Daily  . dicyclomine  10 mg Oral TID AC  . donepezil  10 mg Oral QHS  . fluorouracil   Topical BID  . furosemide  40 mg Intravenous Q12H  . guaiFENesin  600 mg Oral BID  . labetalol  200 mg Oral BID  . magnesium oxide  400 mg Oral BID  . multivitamin with  minerals  1 tablet Oral Daily  . mycophenolate  250 mg Oral q morning - 10a   And  . mycophenolate  500 mg Oral QHS  . ondansetron (ZOFRAN) IV  4 mg Intravenous Q6H  . pantoprazole  40 mg Oral Daily  . potassium chloride SA  10 mEq Oral BID  . predniSONE  5 mg Oral Daily  . tacrolimus  0.5 mg Oral BID    Assessment: Patient arrives for SOB and feeling weak w/ h/o antiphospholipid syndrome w/ h/o DVTs being anticoagulated w/ warfarin PTA as follows: Warfarin 3 mg MF Warfarin 2 mg all other days See anticoagulation clinic at [facility] that has placed him on a goal INR of 2.5 - 3.0. [name] INR is subtherapeutic tonight at 1.4 and therefore patient will be bridged w/ heparin until patient can have x2 INRs within 2.5 - 3.0. baseline CBC 10 - 10.5, tonight is lower than baseline, aPTT 68 seconds.  Goal of Therapy:  INR 2.5 -3.0  Heparin level goal 0.3 - 0.7 Monitor platelets by anticoagulation protocol:  Yes   Plan:  [id number] @ 0000 HL 0.30 therapeutic, trended down from initial level of 0.60. Will increase rate slightly to 1050 units/hr and will recheck HL at 0800. CBC trended down will continue to monitor.  Tobie Lords, PharmD, BCPS Clinical Pharmacist 02/25/2019,1:59 AM

## 2019-02-25 NOTE — ED Notes (Signed)
Awake and alert.  NAD.  To floor via stretcher

## 2019-02-25 NOTE — Progress Notes (Signed)
Patient pulled up and repositioned in bed. Resting comfortably. Will continue to monitor.

## 2019-02-26 LAB — CBC WITH DIFFERENTIAL/PLATELET
Abs Immature Granulocytes: 0.02 10*3/uL (ref 0.00–0.07)
Basophils Absolute: 0 10*3/uL (ref 0.0–0.1)
Basophils Relative: 0 %
Eosinophils Absolute: 0.1 10*3/uL (ref 0.0–0.5)
Eosinophils Relative: 1 %
HCT: 25 % — ABNORMAL LOW (ref 39.0–52.0)
Hemoglobin: 7.4 g/dL — ABNORMAL LOW (ref 13.0–17.0)
Immature Granulocytes: 0 %
Lymphocytes Relative: 11 %
Lymphs Abs: 0.7 10*3/uL (ref 0.7–4.0)
MCH: 27.2 pg (ref 26.0–34.0)
MCHC: 29.6 g/dL — ABNORMAL LOW (ref 30.0–36.0)
MCV: 91.9 fL (ref 80.0–100.0)
Monocytes Absolute: 0.7 10*3/uL (ref 0.1–1.0)
Monocytes Relative: 11 %
Neutro Abs: 4.7 10*3/uL (ref 1.7–7.7)
Neutrophils Relative %: 77 %
Platelets: 117 10*3/uL — ABNORMAL LOW (ref 150–400)
RBC: 2.72 MIL/uL — ABNORMAL LOW (ref 4.22–5.81)
RDW: 14.1 % (ref 11.5–15.5)
WBC: 6.2 10*3/uL (ref 4.0–10.5)
nRBC: 0 % (ref 0.0–0.2)

## 2019-02-26 LAB — BASIC METABOLIC PANEL
Anion gap: 11 (ref 5–15)
BUN: 36 mg/dL — ABNORMAL HIGH (ref 8–23)
CO2: 20 mmol/L — ABNORMAL LOW (ref 22–32)
Calcium: 8.6 mg/dL — ABNORMAL LOW (ref 8.9–10.3)
Chloride: 111 mmol/L (ref 98–111)
Creatinine, Ser: 3.48 mg/dL — ABNORMAL HIGH (ref 0.61–1.24)
GFR calc Af Amer: 19 mL/min — ABNORMAL LOW (ref >60)
GFR calc non Af Amer: 17 mL/min — ABNORMAL LOW (ref >60)
Glucose, Bld: 87 mg/dL (ref 70–99)
Potassium: 3.7 mmol/L (ref 3.5–5.1)
Sodium: 142 mmol/L (ref 135–145)

## 2019-02-26 LAB — LEGIONELLA PNEUMOPHILA SEROGP 1 UR AG: L. pneumophila Serogp 1 Ur Ag: NEGATIVE

## 2019-02-26 LAB — HEPARIN LEVEL (UNFRACTIONATED): Heparin Unfractionated: 0.35 IU/mL (ref 0.30–0.70)

## 2019-02-26 LAB — PROTIME-INR
INR: 2 — ABNORMAL HIGH (ref 0.8–1.2)
Prothrombin Time: 22.4 seconds — ABNORMAL HIGH (ref 11.4–15.2)

## 2019-02-26 LAB — MAGNESIUM: Magnesium: 2 mg/dL (ref 1.7–2.4)

## 2019-02-26 MED ORDER — SODIUM CHLORIDE 0.9 % IV SOLN
INTRAVENOUS | Status: DC | PRN
  Administered 2019-02-26: 03:00:00 250 mL via INTRAVENOUS

## 2019-02-26 MED ORDER — EPOETIN ALFA 10000 UNIT/ML IJ SOLN
10000.0000 [IU] | INTRAMUSCULAR | Status: DC
  Administered 2019-02-26: 10000 [IU] via SUBCUTANEOUS
  Filled 2019-02-26 (×2): qty 1

## 2019-02-26 MED ORDER — WARFARIN SODIUM 3 MG PO TABS
3.0000 mg | ORAL_TABLET | Freq: Once | ORAL | Status: AC
  Administered 2019-02-26: 3 mg via ORAL
  Filled 2019-02-26: qty 1

## 2019-02-26 NOTE — Progress Notes (Signed)
[facility], [place] 02/26/19  Subjective:   Hospital day # 2  Feels better today Requiring O2 by [place] - does not wear O2 at home Able to eat breakfast without nausea or vomiting Renal: 01/18 0701 - 01/19 0700 In: 186.1 [IV Piggyback:186.1] Out: 1500 [Urine:1500] Lab Results  Component Value Date   CREATININE 3.48 (H) 02/26/2019   CREATININE 3.81 (H) 02/25/2019   CREATININE 3.66 (H) 02/24/2019     Objective:  Vital signs in last 24 hours:  Temp:  [97.6 F (36.4 C)-98.8 F (37.1 C)] 98.2 F (36.8 C) (01/19 0606) Pulse Rate:  [61-75] 69 (01/19 0606) Resp:  [16-20] 20 (01/19 0606) BP: (125-150)/(62-74) 141/74 (01/19 0606) SpO2:  [97 %-100 %] 97 % (01/19 0606)  Weight change:  Filed Weights   02/24/19 0018 02/25/19 0500  Weight: 63.5 kg 63.5 kg    Intake/Output:    Intake/Output Summary (Last 24 hours) at 02/26/2019 0908 Last data filed at 02/25/2019 1629 Gross per 24 hour  Intake 186.05 ml  Output 1500 ml  Net -1313.95 ml     Physical Exam: General: NAD, Laying in bed   HEENT Anicteric, moist oral mucus membranes  Pulm/lungs [place] O2, Coarse breath sounds  CVS/Heart regular  Abdomen:  Soft, NT  Extremities: No edema  Neurologic: Alert, orieinted  Skin: No acute rashes   foley in place       Basic Metabolic Panel:  Recent Labs  Lab 02/24/19 0020 02/24/19 0020 02/24/19 0440 02/25/19 0511 02/26/19 0618  NA 137  --  136 139 142  K 4.5  --  4.3 3.5 3.7  CL 106  --  108 107 111  CO2 23  --  23 21* 20*  GLUCOSE 101*  --  94 93 87  BUN 45*  --  45* 45* 36*  CREATININE 4.07*  --  3.66* 3.81* 3.48*  CALCIUM 9.2   < > 8.2* 8.5* 8.6*  MG  --   --   --  1.9 2.0   < > = values in this interval not displayed.     CBC: Recent Labs  Lab 02/24/19 0020 02/24/19 0440 02/25/19 0511 02/26/19 0618  WBC 8.7 9.2 8.3 6.2  NEUTROABS 7.0 7.3 6.5 4.7  HGB 9.8* 8.1* 8.0* 7.4*  HCT 31.8* 26.5* 25.7* 25.0*  MCV 91.1 91.1 89.5 91.9   PLT 141* 113* 120* 117*     No results found for: HEPBSAG, HEPBSAB, HEPBIGM    Microbiology:  Recent Results (from the past 240 hour(s))  Blood Culture (routine x 2)     Status: None (Preliminary result)   Collection Time: 02/24/19 12:20 AM   Specimen: BLOOD  Result Value Ref Range Status   Specimen Description BLOOD LEFT FOREARM  Final   Special Requests   Final    BOTTLES DRAWN AEROBIC AND ANAEROBIC Blood Culture results may not be optimal due to an excessive volume of blood received in culture bottles   Culture   Final    NO GROWTH 2 DAYS Performed at [facility], [address], [place], [place] [address]    Report Status PENDING  Incomplete  Blood Culture (routine x 2)     Status: None (Preliminary result)   Collection Time: 02/24/19 12:20 AM   Specimen: BLOOD  Result Value Ref Range Status   Specimen Description BLOOD  Final   Special Requests   Final    BOTTLES DRAWN AEROBIC AND ANAEROBIC Blood Culture results may not be  optimal due to an excessive volume of blood received in culture bottles   Culture   Final    NO GROWTH 2 DAYS Performed at [facility], [address] [place], [place] [address]    Report Status PENDING  Incomplete  Urine culture     Status: None   Collection Time: 02/24/19 12:20 AM   Specimen: In/Out Cath Urine  Result Value Ref Range Status   Specimen Description   Final    IN/OUT CATH URINE Performed at [facility], [address], [place] [address], [place] [address]    Special Requests   Final    NONE Performed at [facility], [address], [place], [place] [address]    Culture   Final    NO GROWTH Performed at [facility], [place] [address], [place], [place] [address]    Report Status 02/25/2019 FINAL  Final  Respiratory Panel by RT PCR (Flu A&B, Covid) - Nasopharyngeal Swab     Status: None   Collection Time: 02/24/19 12:20 AM   Specimen: Nasopharyngeal Swab  Result Value Ref Range Status    SARS Coronavirus 2 by RT PCR NEGATIVE NEGATIVE Final    Comment: (NOTE) SARS-CoV-2 target nucleic acids are NOT DETECTED. The SARS-CoV-2 RNA is generally detectable in upper respiratoy specimens during the acute phase of infection. The lowest concentration of SARS-CoV-2 viral copies this assay can detect is 131 copies/mL. A negative result does not preclude SARS-Cov-2 infection and should not be used as the sole basis for treatment or other patient management decisions. A negative result may occur with  improper specimen collection/handling, submission of specimen other than nasopharyngeal swab, presence of viral mutation(s) within the areas targeted by this assay, and inadequate number of viral copies (<131 copies/mL). A negative result must be combined with clinical observations, patient history, and epidemiological information. The expected result is Negative. Fact Sheet for Patients:  PinkCheek.be Fact Sheet for Healthcare Providers:  GravelBags.it This test is not yet ap proved or cleared by the [place] FDA and  has been authorized for detection and/or diagnosis of SARS-CoV-2 by FDA under an Emergency Use Authorization (EUA). This EUA will remain  in effect (meaning this test can be used) for the duration of the COVID-19 declaration under Section 564(b)(1) of the Act, 21 U.S.C. section 360bbb-3(b)(1), unless the authorization is terminated or revoked sooner.    Influenza A by PCR NEGATIVE NEGATIVE Final   Influenza B by PCR NEGATIVE NEGATIVE Final    Comment: (NOTE) The Xpert Xpress SARS-CoV-2/FLU/RSV assay is intended as an aid in  the diagnosis of influenza from Nasopharyngeal swab specimens and  should not be used as a sole basis for treatment. Nasal washings and  aspirates are unacceptable for Xpert Xpress SARS-CoV-2/FLU/RSV  testing. Fact Sheet for Patients: PinkCheek.be Fact  Sheet for Healthcare Providers: GravelBags.it This test is not yet approved or cleared by the [place] FDA and  has been authorized for detection and/or diagnosis of SARS-CoV-2 by  FDA under an Emergency Use Authorization (EUA). This EUA will remain  in effect (meaning this test can be used) for the duration of the  Covid-19 declaration under Section 564(b)(1) of the Act, 21  U.S.C. section 360bbb-3(b)(1), unless the authorization is  terminated or revoked. Performed at [facility], [address] [place], [place] [address]     Coagulation Studies: Recent Labs    02/24/19 0020 02/25/19 0511 02/26/19 0618  LABPROT 17.5* 23.1* 22.4*  INR 1.4*  2.1* 2.0*    Urinalysis: Recent Labs    02/24/19 0020  COLORURINE YELLOW*  LABSPEC 1.013  PHURINE 6.0  GLUCOSEU NEGATIVE  HGBUR SMALL*  BILIRUBINUR NEGATIVE  KETONESUR NEGATIVE  PROTEINUR 100*  NITRITE NEGATIVE  LEUKOCYTESUR NEGATIVE      Imaging: NM Pulmonary Perfusion  Result Date: 02/25/2019 CLINICAL DATA:  Shortness of breath, pneumonia, COVID negative, history of renal transplant EXAM: NUCLEAR MEDICINE PERFUSION LUNG SCAN TECHNIQUE: Perfusion images were obtained in multiple projections after intravenous injection of radiopharmaceutical. Ventilation scans intentionally deferred if perfusion scan and chest x-ray adequate for interpretation during COVID 19 epidemic. RADIOPHARMACEUTICALS:  3.78 mCi Tc-35m MAA IV COMPARISON:  None Correlation: Chest radiograph and chest CT of 02/24/2019 FINDINGS: Patchy nonsegmental diminished perfusion LEFT lower lobe. Fluid within RIGHT major fissure. No other perfusion defects identified. Ventilation exam not performed. Overall, perfusion appears much better than expected based on the BILATERAL pleural effusions and lower lobe consolidation seen on CT of 02/24/2019. Findings represent a low probability for pulmonary embolism. IMPRESSION: Low probability  of pulmonary embolism. Electronically Signed   By: Lavonia Dana M.D.   On: 02/25/2019 13:40   US RENAL  Result Date: 02/24/2019 CLINICAL DATA:  Acute kidney injury. EXAM: RENAL / URINARY TRACT ULTRASOUND COMPLETE COMPARISON:  October 17, 2018. FINDINGS: Right Kidney: Renal measurements: 5.0 x 2.5 x 2.5 cm = volume: 16 mL. Increased echogenicity is noted consistent with medical renal disease. No mass or hydronephrosis visualized. Left Kidney: Not visualized. Bladder: Appears normal for degree of bladder distention. Other: Renal transplant is noted in left lower quadrant. No definite hydronephrosis is noted. It measures 9.0 x 5.2 x 5.0 cm with calculated size of 122 mL. Grossly normal echogenicity of renal parenchyma is noted. IMPRESSION: Renal transplant is noted in left lower quadrant. No hydronephrosis is noted. It appears grossly normal in appearance and size. Severely atrophic right kidney is noted consistent with end-stage renal disease. Left kidney is not visualized. Electronically Signed   By: Marijo Conception M.D.   On: 02/24/2019 16:22     Medications:   . sodium chloride 250 mL (02/26/19 0323)  . azithromycin 500 mg (02/26/19 0327)  . cefTRIAXone (ROCEPHIN)  IV 2 g (02/26/19 0845)  . heparin 1,050 Units/hr (02/26/19 0318)   . amLODipine  10 mg Oral Daily  . buPROPion  300 mg Oral Daily  . calcitRIOL  0.25 mcg Oral QHS  . citalopram  20 mg Oral Daily  . dicyclomine  10 mg Oral TID AC  . donepezil  10 mg Oral QHS  . fluorouracil   Topical BID  . guaiFENesin  600 mg Oral BID  . labetalol  200 mg Oral BID  . magnesium oxide  400 mg Oral BID  . mouth rinse  15 mL Mouth Rinse BID  . multivitamin with minerals  1 tablet Oral Daily  . mycophenolate  250 mg Oral q morning - 10a   And  . mycophenolate  500 mg Oral QHS  . ondansetron (ZOFRAN) IV  4 mg Intravenous Q6H  . pantoprazole  40 mg Oral Daily  . potassium chloride SA  10 mEq Oral BID  . predniSONE  5 mg Oral Daily  . tacrolimus   0.5 mg Oral BID  . Warfarin - Pharmacist Dosing Inpatient   Does not apply q1800   sodium chloride, ALPRAZolam, benzonatate, diphenoxylate-atropine, labetalol, sodium chloride  Assessment/ Plan:  69 y.o. male with living non related donor (wife) kidney transplant 2001, immunosuppression( tacrolimus, prednisone  and mycophenolate) , stroke,TIA, HTN, hyperlipidemia, DVT, PAD, vascular dementia, hyperparathyroidism admitted on 02/24/2019 for Hypoxia [R09.02] CAP (community acquired pneumonia) [J18.9] AKI (acute kidney injury) ([place]) [N17.9] Multifocal pneumonia [J18.9] Dyspnea, unspecified type [R06.00]  1. AKI- elevated creatinine of 3.81 (base 2.71 09/20) and BUN of 45  secondary to recent IV contrast use on 01/12 combined with current infection and possible CHF excerbation.  - S Creatinine is improving with Supportive care and iv Abx - changed to regular diet per patient request  2. Post status left Kidney transplant ([facility]) (2001) , stage IV CKD - Continue home dose of  immunosuppression( tacrolimus, prednisone and mycophenolate) -followed by Dr Alger Simons at [facility]  3. Anemia of CKD- normocytic anemia likely from chronic kidney disease  - continue to monitor closely -give sub q EPO weekly (TUE)  4. Shortness of breath  atypical PNA  versus developing pulmonary edema. - continue antibiotic avoid nephrotoxic medications   -  Echo 1/18= LVEF 55-60%, LVH    LOS: 2 Eric Gill 1/19/20219:08 AM  [facility] [place], [place]  Note: This note was prepared with Dragon dictation. Any transcription errors are unintentional

## 2019-02-26 NOTE — Progress Notes (Signed)
[place] for heparin/warfarin Indication: antiphospholipid syndrome w/ h/o DVTs  Allergies  Allergen Reactions  . Losartan Cough  . Statins Nausea And Vomiting and Other (See Comments)    Reaction:  Muscle pain     Patient Measurements: Height: 5\' 3"  (160 cm) Weight: 139 lb 15.9 oz (63.5 kg) IBW/kg (Calculated) : 56.9 Heparin Dosing Weight: 63.5 kg  Vital Signs: Temp: 98.2 F (36.8 C) (01/19 0606) Temp Source: Oral (01/19 0606) BP: 141/74 (01/19 0606) Pulse Rate: 69 (01/19 0606)  Labs: Recent Labs    02/24/19 0020 02/24/19 0020 02/24/19 0440 02/24/19 1546 02/25/19 0010 02/25/19 0511 02/25/19 1413 02/25/19 2157 02/26/19 0618  HGB 9.8*   < > 8.1*  --   --  8.0*  --   --  7.4*  HCT 31.8*   < > 26.5*  --   --  25.7*  --   --  25.0*  PLT 141*   < > 113*  --   --  120*  --   --  117*  APTT 68*  --   --   --   --  >160*  --   --   --   LABPROT 17.5*  --   --   --   --  23.1*  --   --  22.4*  INR 1.4*  --   --   --   --  2.1*  --   --  2.0*  HEPARINUNFRC  --   --   --    < >   < >  --  0.35 0.33 0.35  CREATININE 4.07*   < > 3.66*  --   --  3.81*  --   --  3.48*   < > = values in this interval not displayed.    Estimated Creatinine Clearance: 15.9 mL/min (A) (by C-G formula based on SCr of 3.48 mg/dL (H)).   Medical History: Past Medical History:  Diagnosis Date  . Anemia   . Antiphospholipid antibody syndrome ([place])   . Arthritis   . DVT (deep venous thrombosis) ([place])   . Essential hypertension   . Gastric ulcer   . GERD (gastroesophageal reflux disease)   . History of CMV   . Hyperlipidemia   . Hyperparathyroidism ([place])   . Pedal edema   . Peripheral vascular disease ([place])   . Rosacea   . Status post kidney transplant 2001  . Stroke ([place])   . TIA (transient ischemic attack)   . Vascular dementia without behavioral disturbance ([facility])     Medications:  Scheduled:  . amLODipine  10 mg Oral Daily  . buPROPion  300 mg  Oral Daily  . calcitRIOL  0.25 mcg Oral QHS  . citalopram  20 mg Oral Daily  . dicyclomine  10 mg Oral TID AC  . donepezil  10 mg Oral QHS  . fluorouracil   Topical BID  . guaiFENesin  600 mg Oral BID  . labetalol  200 mg Oral BID  . magnesium oxide  400 mg Oral BID  . mouth rinse  15 mL Mouth Rinse BID  . multivitamin with minerals  1 tablet Oral Daily  . mycophenolate  250 mg Oral q morning - 10a   And  . mycophenolate  500 mg Oral QHS  . ondansetron (ZOFRAN) IV  4 mg Intravenous Q6H  . pantoprazole  40 mg Oral Daily  . potassium chloride SA  10 mEq Oral BID  . predniSONE  5 mg Oral Daily  . tacrolimus  0.5 mg Oral BID  . Warfarin - Pharmacist Dosing Inpatient   Does not apply q1800    Assessment: Patient arrives for SOB and feeling weak w/ h/o antiphospholipid syndrome w/ h/o DVTs being anticoagulated w/ warfarin PTA as follows: Warfarin 3 mg MF Warfarin 2 mg all other days See [facility]agulation clinic [facility] that has placed him on a goal INR of 2.5 - 3.0. [name] INR is subtherapeutic tonight at 1.4 and therefore patient will be bridged w/ heparin until patient can have x2 INRs within 2.5 - 3.0. baseline CBC 10 - 10.5, tonight is lower than baseline, aPTT 68 seconds.  Heparin Course: 1/17 1546 HL 0.60, therapeutic x 1 1/18 0000 HL 0.30, rate increased to 1050 units/hr 1/18 1413 HL 0.35, therapeutic x 1  Warfarin Course: 1/17 INR 1.4, warfarin 3mg  given 1/18 INR 2.1  Goal of Therapy:  INR 2.5 -3.0  Heparin level goal 0.3 - 0.7 Monitor platelets by anticoagulation protocol: Yes   Plan:  1/18 1416 HL 0.35, therapeutic x 1 since rate change, will continue with current rate of 1050 units/hr. Will recheck HL in 8 hours.  1/18 2157 HL 0.33, therapeutic x 2.  Continue current rate of 1050 units/hr and recheck HL daily in am  1/19 0618 HL 0.35, therapeutic. CBC slightly worse.  Continue current rate.  F/U for warfarin dosing.    1/18 INR 2.1 this morning. Patient  received warfarin 3mg  on 1/17 and INR increased from 1.4 to 2.1. Will hold on warfarin dose this evening since INR jumped up so quickly. Will check INR with AM labs.  Hgb/Hct are trending down, will continue to follow closely. Daily CBC with AM labs per protocol.  Hart Robinsons, PharmD Clinical Pharmacist   02/26/2019 7:06 AM

## 2019-02-26 NOTE — Evaluation (Signed)
Physical Therapy Evaluation Patient Details Name: Eric Gill MRN: [id number] DOB: November 13, 1950 Today's Date: 02/26/2019   History of Present Illness  Pt is a 69 y.o. male presenting to hospital 02/23/18 with SOB and fever.  Pt admitted with community acquired PNA with subsequent sepsis and acute hypoxemic respiratory failure, AKI on stage IV CKD, and elevated BNP.  PMH includes anemia, COPD, htn, HLD, dementia, DVT, PVD, s/p kidney transplant, stroke, and h/o elbow surgery.  Clinical Impression  Prior to hospital admission, pt was ambulatory with 4ww (pt has had difficulties with mobility since December 31st and has required increasing assist from his wife).  Currently pt is SBA with bed mobility; CGA with transfers using RW; and CGA ambulating 15 feet with RW.  Pt appearing shaky in general with ambulation (therapist limited distance ambulating d/t safety concerns (d/t pt being shaky and on heparin drip).  Pt would benefit from skilled PT to address noted impairments and functional limitations (see below for any additional details).  Upon hospital discharge, pt would benefit from HHPT and 24/7 assist for safety.    Follow Up Recommendations Home health PT;Supervision/Assistance - 24 hour    Equipment Recommendations  Rolling walker with 5" wheels;3in1 (PT)    Recommendations for Other Services OT consult     Precautions / Restrictions Precautions Precautions: Fall Restrictions Weight Bearing Restrictions: No      Mobility  Bed Mobility Overal bed mobility: Needs Assistance Bed Mobility: Supine to Sit;Sit to Supine     Supine to sit: Supervision;HOB elevated Sit to supine: Supervision;HOB elevated   General bed mobility comments: mild increased effort to perform; SBA for lines  Transfers Overall transfer level: Needs assistance Equipment used: Rolling walker (2 wheeled) Transfers: Sit to/from [facility] Sit to Stand: Min guard Stand pivot transfers: Min  guard       General transfer comment: mild increased effort to stand from bed and from recliner up to RW; stand step turn bed to recliner with RW CGA  Ambulation/Gait Ambulation/Gait assistance: Min guard Gait Distance (Feet): 15 Feet Assistive device: Rolling walker (2 wheeled)   Gait velocity: decreased   General Gait Details: partial step through gait pattern; shaky in general (CGA provided for safety)  Stairs            Wheelchair Mobility    Modified Rankin (Stroke Patients Only)       Balance Overall balance assessment: Needs assistance Sitting-balance support: No upper extremity supported;Feet supported Sitting balance-Leahy Scale: Good Sitting balance - Comments: steady sitting reaching within BOS   Standing balance support: Single extremity supported Standing balance-Leahy Scale: Poor Standing balance comment: pt requiring at least single UE support for static standing balance                             Pertinent Vitals/Pain Pain Assessment: No/denies pain  Vitals (HR and O2 on 4 L O2 via nasal cannula) stable and WFL throughout treatment session.    Home Living Family/patient expects to be discharged to:: Private residence Living Arrangements: Spouse/significant other Available Help at Discharge: Family;Available 24 hours/day Type of Home: House Home Access: Stairs to enter Entrance Stairs-Rails: Right;Left;Can reach both Entrance Stairs-Number of Steps: 4 Home Layout: Two level;Able to live on main level with bedroom/bathroom Home Equipment: Cane - single point;Grab bars - tub/shower;Walker - 4 wheels      Prior Function Level of Independence: Needs assistance   Gait / Transfers Assistance Needed:  Ambulatory with 4ww but fell last week when sitting down; has had increased difficulty with walking since December 31st (initially had walking boot R LE d/t ankle pain but no longer wears; pt's wife reports LE pain now thought to be more  vascularly related)     Comments: Pt's wife has been assisting pt with mobility most recently d/t pt's difficulties     Hand Dominance        Extremity/Trunk Assessment   Upper Extremity Assessment Upper Extremity Assessment: Generalized weakness    Lower Extremity Assessment Lower Extremity Assessment: Generalized weakness    Cervical / Trunk Assessment Cervical / Trunk Assessment: Normal  Communication   Communication: No difficulties  Cognition Arousal/Alertness: Awake/alert Behavior During Therapy: WFL for tasks assessed/performed Overall Cognitive Status: History of cognitive impairments - at baseline                                        General Comments General comments (skin integrity, edema, etc.): mild bleeding noted from pt's R forearm IV site end of session: unable to get hold of pt's nurse so charge nurse notified who came to assess and addressed pt's IV.  Nursing cleared pt for participation in physical therapy.  Pt agreeable to PT session.  Pt's wife left beginning of session.    Exercises     Assessment/Plan    PT Assessment Patient needs continued PT services  PT Problem List Decreased strength;Decreased activity tolerance;Decreased balance;Decreased mobility;Decreased knowledge of precautions       PT Treatment Interventions DME instruction;Gait training;Stair training;Functional mobility training;Therapeutic activities;Therapeutic exercise;Balance training;Patient/family education    PT Goals (Current goals can be found in the Care Plan section)  Acute Rehab PT Goals Patient Stated Goal: to improve breathing PT Goal Formulation: With patient Time For Goal Achievement: 03/12/19 Potential to Achieve Goals: Fair    Frequency Min 2X/week   Barriers to discharge        Co-evaluation               AM-PAC PT "6 Clicks" Mobility  Outcome Measure Help needed turning from your back to your side while in a flat bed without  using bedrails?: None Help needed moving from lying on your back to sitting on the side of a flat bed without using bedrails?: None Help needed moving to and from a bed to a chair (including a wheelchair)?: A Little Help needed standing up from a chair using your arms (e.g., wheelchair or bedside chair)?: A Little Help needed to walk in hospital room?: A Little Help needed climbing 3-5 steps with a railing? : A Little 6 Click Score: 20    End of Session Equipment Utilized During Treatment: Gait belt;Oxygen(4 L O2 via nasal cannula) Activity Tolerance: Patient tolerated treatment well Patient left: in bed;with call bell/phone within reach;with bed alarm set Nurse Communication: Mobility status;Precautions PT Visit Diagnosis: Other abnormalities of gait and mobility (R26.89);Muscle weakness (generalized) (M62.81);Difficulty in walking, not elsewhere classified (R26.2)    Time: 1527-1610 PT Time Calculation (min) (ACUTE ONLY): 43 min   Charges:   PT Evaluation $PT Eval Low Complexity: 1 Low PT Treatments $Therapeutic Activity: 23-37 mins        Leitha Bleak, PT 02/26/19, 5:30 PM

## 2019-02-26 NOTE — Progress Notes (Signed)
[facility] Progress Note  Patient: Eric Gill    [id number]  DOA: 02/24/2019     Date of Service: the patient was seen and examined on 02/26/2019  Chief Complaint  Patient presents with  . Shortness of Breath  . Fever   Brief hospital course: Eric Gill  is a 69 y.o. male with a known history of multiple medical problems that are mentioned below, who presented to the emergency room with onset of worsening dyspnea since yesterday with associated dry cough and wheezing.  He admitted to fever and chills.  Not any chest pain or palpitations.  No nausea vomiting or diarrhea or abdominal pain.  When asked about COVID-19 exposure he stated that he was in the emergency room last week and stayed about 13 hours.  He denied any bleeding diathesis.  No dysuria, oliguria or hematuria or flank pain.  Upon presentation to the emergency room, blood pressure was 189/90 with a pulse 104 respiratory 24 and pulse which was 9 9% on her percent nonrebreather with a temperature of 98.7.  It was then tapered to 6 L of O2 by nasal cannula.  Labs were remarkable for a BUN of 45 and creatinine 4.07 compared to 37 and 3 on 10/30/2018.  Is LDH was 243 and ferritin 303.  Lactic acid 0.9 procalcitonin 0.17 and CBC showed anemia that is close to his previous levels.  Urinalysis came back negative.  Blood cultures were drawn.  Portable chest x-ray showed cardiomegaly and prominent bilateral interstitial lung markings with focal airspace opacities in the right mid and right lower lung zone is concerning for an atypical infectious process versus developing pulmonary edema.  Patient was given 1 g of p.o. Tylenol, IV cefepime, vancomycin and Flagyl and 1 L IV normal saline.  The patient will be admitted to medical monitored bed for further evaluation and management.  Currently further plan is continue to treat pneumonia.  Assessment and Plan: 1.  Community-acquired pneumonia Acute hypoxic respiratory failure  Treated with with IV fluids Currently on hold Continue with Rocephin and azithromycin.  Follow-up on cultures. COVID-19 is negative. Supportive measures with Tessalon Perles  2.  Acute kidney injury. Chronic kidney disease stage IV. History of renal transplant Immunosuppressed Appreciate nephrology input. Recommend continuing Prograf CellCept and prednisone at current dose. Acute kidney injury likely in the setting of pneumonia. Does not appear to have any volume overload right now. No further diuresis for now. We will continue to monitor daily in and out and daily weight  3.  Elevated BNP Continue to monitor for now. Recognizes all in the setting of pneumonia.  Monitor.  4.  Essential hypertension. Continue blood pressure medications.  5.  History of depression Currently no suicidal homicidal ideation. Stable. Continue Celexa  6.  History of antiphospholipid syndrome Chronic anticoagulation with warfarin [name] on warfarin.  Currently INR subtherapeutic. Bridging the patient with therapeutic IV heparin while INR is subtherapeutic.  Pharmacy monitoring INR.  Diet: Regular diet per patient request DVT Prophylaxis: Therapeutic Anticoagulation with Heparin   Advance goals of care discussion: Full code  Family Communication: no family was present at bedside, at the time of interview.   Disposition:  Pt is from home, admitted with confusion, cough and shortness of breath diagnosed with pneumonia and acute kidney injury, still has acute kidney injury and hypoxia, which precludes a safe discharge. Discharge to home, when cleared by nephrology and improvement in oxygenation.  Subjective: Improvement in cough as well as shortness of breath.  No nausea no vomiting.  No fever no chills.  Oral intake still minimal.  Physical Exam: General: alert oriented to time, place, and person.  Appear in mild distress, affect appropriate Eyes: PERRL ENT: Oral Mucosa Clear, moist   Neck: no JVD,  Cardiovascular: S1 and S2 Present, no Murmur,  Respiratory: good respiratory effort, Bilateral Air entry equal and Decreased, bilateral  Crackles, no wheezes Abdomen: Bowel Sound present, Soft and no tenderness,  Skin: no rash Extremities: no Pedal edema, no calf tenderness Neurologic: without any new focal findings  Gait not checked due to patient safety concerns  Vitals:   02/26/19 0101 02/26/19 0606 02/26/19 1100 02/26/19 1222  BP: (!) 141/68 (!) 141/74 (!) 145/78 (!) 142/70  Pulse: 75 69 72 70  Resp:  20  16  Temp:  98.2 F (36.8 C)  98.5 F (36.9 C)  TempSrc:  Oral  Oral  SpO2:  97%  96%  Weight:      Height:        Intake/Output Summary (Last 24 hours) at 02/26/2019 1930 Last data filed at 02/26/2019 1800 Gross per 24 hour  Intake 802.8 ml  Output 300 ml  Net 502.8 ml   Filed Weights   02/24/19 0018 02/25/19 0500  Weight: 63.5 kg 63.5 kg    Data Reviewed: I have personally reviewed and interpreted daily labs, tele strips, imagings as discussed above. I reviewed all nursing notes, pharmacy notes, vitals, pertinent old records I have discussed plan of care as described above with RN and patient/family.  CBC: Recent Labs  Lab 02/24/19 0020 02/24/19 0440 02/25/19 0511 02/26/19 0618  WBC 8.7 9.2 8.3 6.2  NEUTROABS 7.0 7.3 6.5 4.7  HGB 9.8* 8.1* 8.0* 7.4*  HCT 31.8* 26.5* 25.7* 25.0*  MCV 91.1 91.1 89.5 91.9  PLT 141* 113* 120* 123XX123*   Basic Metabolic Panel: Recent Labs  Lab 02/24/19 0020 02/24/19 0440 02/25/19 0511 02/26/19 0618  NA 137 136 139 142  K 4.5 4.3 3.5 3.7  CL 106 108 107 111  CO2 23 23 21* 20*  GLUCOSE 101* 94 93 87  BUN 45* 45* 45* 36*  CREATININE 4.07* 3.66* 3.81* 3.48*  CALCIUM 9.2 8.2* 8.5* 8.6*  MG  --   --  1.9 2.0    Studies: No results found.  Scheduled Meds: . amLODipine  10 mg Oral Daily  . buPROPion  300 mg Oral Daily  . calcitRIOL  0.25 mcg Oral QHS  . citalopram  20 mg Oral Daily  . dicyclomine  10  mg Oral TID AC  . donepezil  10 mg Oral QHS  . epoetin (EPOGEN/PROCRIT) injection  10,000 Units Subcutaneous Weekly  . fluorouracil   Topical BID  . guaiFENesin  600 mg Oral BID  . labetalol  200 mg Oral BID  . magnesium oxide  400 mg Oral BID  . mouth rinse  15 mL Mouth Rinse BID  . multivitamin with minerals  1 tablet Oral Daily  . mycophenolate  250 mg Oral q morning - 10a   And  . mycophenolate  500 mg Oral QHS  . ondansetron (ZOFRAN) IV  4 mg Intravenous Q6H  . pantoprazole  40 mg Oral Daily  . potassium chloride SA  10 mEq Oral BID  . predniSONE  5 mg Oral Daily  . tacrolimus  0.5 mg Oral BID  . Warfarin - Pharmacist Dosing Inpatient   Does not apply q1800   Continuous Infusions: . sodium chloride 250 mL (02/26/19 0323)  .  azithromycin 500 mg (02/26/19 0327)  . cefTRIAXone (ROCEPHIN)  IV Stopped (02/26/19 1758)  . heparin 1,050 Units/hr (02/26/19 0318)   PRN Meds: sodium chloride, ALPRAZolam, benzonatate, diphenoxylate-atropine, labetalol, sodium chloride  Time spent: 35 minutes  Author: Berle Mull, MD [facility] 02/26/2019 7:30 PM  To reach On-call, see care teams to locate the attending and reach out to them via [email or web address]. If 7PM-7AM, please contact night-coverage If you still have difficulty reaching the attending provider, please page the [facility] (Director on Call) for [facility] on amion for assistance.

## 2019-02-26 NOTE — Progress Notes (Signed)
[place] for heparin/warfarin Indication: antiphospholipid syndrome w/ h/o DVTs  Patient Measurements: Height: 5\' 3"  (160 cm) Weight: 139 lb 15.9 oz (63.5 kg) IBW/kg (Calculated) : 56.9 Heparin Dosing Weight: 63.5 kg  Vital Signs: Temp: 98.2 F (36.8 C) (01/19 0606) Temp Source: Oral (01/19 0606) BP: 141/74 (01/19 0606) Pulse Rate: 69 (01/19 0606)  Labs: Recent Labs    02/24/19 0020 02/24/19 0020 02/24/19 0440 02/24/19 1546 02/25/19 0010 02/25/19 0511 02/25/19 1413 02/25/19 2157 02/26/19 0618  HGB 9.8*   < > 8.1*  --   --  8.0*  --   --  7.4*  HCT 31.8*   < > 26.5*  --   --  25.7*  --   --  25.0*  PLT 141*   < > 113*  --   --  120*  --   --  117*  APTT 68*  --   --   --   --  >160*  --   --   --   LABPROT 17.5*  --   --   --   --  23.1*  --   --  22.4*  INR 1.4*  --   --   --   --  2.1*  --   --  2.0*  HEPARINUNFRC  --   --   --    < >   < >  --  0.35 0.33 0.35  CREATININE 4.07*   < > 3.66*  --   --  3.81*  --   --  3.48*   < > = values in this interval not displayed.    Estimated Creatinine Clearance: 15.9 mL/min (A) (by C-G formula based on SCr of 3.48 mg/dL (H)).   Medical History: Past Medical History:  Diagnosis Date  . Anemia   . Antiphospholipid antibody syndrome ([place])   . Arthritis   . DVT (deep venous thrombosis) ([place])   . Essential hypertension   . Gastric ulcer   . GERD (gastroesophageal reflux disease)   . History of CMV   . Hyperlipidemia   . Hyperparathyroidism ([place])   . Pedal edema   . Peripheral vascular disease ([place])   . Rosacea   . Status post kidney transplant 2001  . Stroke ([place])   . TIA (transient ischemic attack)   . Vascular dementia without behavioral disturbance (HCC)     Medications:  Scheduled:  . amLODipine  10 mg Oral Daily  . buPROPion  300 mg Oral Daily  . calcitRIOL  0.25 mcg Oral QHS  . citalopram  20 mg Oral Daily  . dicyclomine  10 mg Oral TID AC  . donepezil  10 mg Oral  QHS  . fluorouracil   Topical BID  . guaiFENesin  600 mg Oral BID  . labetalol  200 mg Oral BID  . magnesium oxide  400 mg Oral BID  . mouth rinse  15 mL Mouth Rinse BID  . multivitamin with minerals  1 tablet Oral Daily  . mycophenolate  250 mg Oral q morning - 10a   And  . mycophenolate  500 mg Oral QHS  . ondansetron (ZOFRAN) IV  4 mg Intravenous Q6H  . pantoprazole  40 mg Oral Daily  . potassium chloride SA  10 mEq Oral BID  . predniSONE  5 mg Oral Daily  . tacrolimus  0.5 mg Oral BID  . Warfarin - Pharmacist Dosing Inpatient   Does not apply [phone]  Assessment: Patient arrives for SOB and feeling weak w/ h/o antiphospholipid syndrome w/ h/o DVTs being anticoagulated w/ warfarin PTA as follows: Warfarin 3 mg MF Warfarin 2 mg all other days See anticoagulation clinic at [facility] that has placed him on a goal INR of 2.5 - 3.0. baseline aPTT 68s, H&H trending down, platelets low at baseline but appear to be stable, no reported bleeding  Heparin Course: 1/17 1546 HL 0.60, therapeutic x 1 1/18 0000 HL 0.30, rate increased to 1050 units/hr 1/18 1413 HL 0.35: no change 1/18 2157 HL 0.33: no change 1/19 0618 HL 0.35: no change  Date    INR    Dose 1/17    1.4    3mg   1/18    2.1    Held 1/19    2.0    3 mg  DDIs: azithromycin, ceftriaxone  Goal of Therapy:  INR 2.5 -3.0  Heparin level goal 0.3 - 0.7 Monitor platelets by anticoagulation protocol: Yes   Plan:   warfarin 3 mg tonight  Continue to bridge w/ heparin until 2 consecutive INRs within 2.5 - 3.0  daily CBC with AM labs per protocol.  Vallery Sa, PharmD Clinical Pharmacist 02/26/2019 7:40 AM

## 2019-02-27 DIAGNOSIS — J189 Pneumonia, unspecified organism: Principal | ICD-10-CM

## 2019-02-27 LAB — TACROLIMUS LEVEL
Tacrolimus (FK506) - LabCorp: 4.5 ng/mL (ref 2.0–20.0)
Tacrolimus (FK506) - LabCorp: 3.9 ng/mL (ref 2.0–20.0)

## 2019-02-27 LAB — CBC WITH DIFFERENTIAL/PLATELET
Abs Immature Granulocytes: 0.04 10*3/uL (ref 0.00–0.07)
Basophils Absolute: 0 10*3/uL (ref 0.0–0.1)
Basophils Relative: 0 %
Eosinophils Absolute: 0.1 10*3/uL (ref 0.0–0.5)
Eosinophils Relative: 1 %
HCT: 24.5 % — ABNORMAL LOW (ref 39.0–52.0)
Hemoglobin: 7.3 g/dL — ABNORMAL LOW (ref 13.0–17.0)
Immature Granulocytes: 1 %
Lymphocytes Relative: 16 %
Lymphs Abs: 0.9 10*3/uL (ref 0.7–4.0)
MCH: 27.3 pg (ref 26.0–34.0)
MCHC: 29.8 g/dL — ABNORMAL LOW (ref 30.0–36.0)
MCV: 91.8 fL (ref 80.0–100.0)
Monocytes Absolute: 0.7 10*3/uL (ref 0.1–1.0)
Monocytes Relative: 12 %
Neutro Abs: 3.8 10*3/uL (ref 1.7–7.7)
Neutrophils Relative %: 70 %
Platelets: 120 10*3/uL — ABNORMAL LOW (ref 150–400)
RBC: 2.67 MIL/uL — ABNORMAL LOW (ref 4.22–5.81)
RDW: 14.1 % (ref 11.5–15.5)
WBC: 5.5 10*3/uL (ref 4.0–10.5)
nRBC: 0 % (ref 0.0–0.2)

## 2019-02-27 LAB — BASIC METABOLIC PANEL
Anion gap: 9 (ref 5–15)
BUN: 31 mg/dL — ABNORMAL HIGH (ref 8–23)
CO2: 21 mmol/L — ABNORMAL LOW (ref 22–32)
Calcium: 8.9 mg/dL (ref 8.9–10.3)
Chloride: 111 mmol/L (ref 98–111)
Creatinine, Ser: 3.57 mg/dL — ABNORMAL HIGH (ref 0.61–1.24)
GFR calc Af Amer: 19 mL/min — ABNORMAL LOW (ref >60)
GFR calc non Af Amer: 16 mL/min — ABNORMAL LOW (ref >60)
Glucose, Bld: 84 mg/dL (ref 70–99)
Potassium: 4.2 mmol/L (ref 3.5–5.1)
Sodium: 141 mmol/L (ref 135–145)

## 2019-02-27 LAB — MAGNESIUM: Magnesium: 2 mg/dL (ref 1.7–2.4)

## 2019-02-27 LAB — HEPARIN LEVEL (UNFRACTIONATED): Heparin Unfractionated: 0.37 IU/mL (ref 0.30–0.70)

## 2019-02-27 LAB — PROTIME-INR
INR: 2.1 — ABNORMAL HIGH (ref 0.8–1.2)
Prothrombin Time: 23.8 seconds — ABNORMAL HIGH (ref 11.4–15.2)

## 2019-02-27 MED ORDER — WARFARIN SODIUM 2 MG PO TABS
4.0000 mg | ORAL_TABLET | Freq: Once | ORAL | Status: AC
  Administered 2019-02-27: 4 mg via ORAL
  Filled 2019-02-27: qty 2
  Filled 2019-02-27: qty 1

## 2019-02-27 MED ORDER — IPRATROPIUM-ALBUTEROL 0.5-2.5 (3) MG/3ML IN SOLN: 3.0000 mL | RESPIRATORY_TRACT | Status: DC | PRN

## 2019-02-27 NOTE — TOC Initial Note (Signed)
Transition of Care [facility]) - Initial/Assessment Note    Patient Details  Name: Eric Gill MRN: [id number] Date of Birth: [birth year]  Transition of Care [facility]) CM/SW Contact:    Beverly Sessions, RN Phone Number: 02/27/2019, 5:09 PM  Clinical Narrative:                 RNCM assessment complete.  Full note to follow         Patient Goals and CMS Choice        Expected Discharge Plan and Services                                                Prior Living Arrangements/Services                       Activities of Daily Living Home Assistive Devices/Equipment: Gilford Rile (specify type) ADL Screening (condition at time of admission) [name] cognitive ability adequate to safely complete daily activities?: Yes Is the patient deaf or have difficulty hearing?: No Does the patient have difficulty seeing, even when wearing glasses/contacts?: No Does the patient have difficulty concentrating, remembering, or making decisions?: No Patient able to express need for assistance with ADLs?: Yes Does the patient have difficulty dressing or bathing?: No Independently performs ADLs?: Yes (appropriate for developmental age) Does the patient have difficulty walking or climbing stairs?: Yes Weakness of Legs: None Weakness of Arms/Hands: None  Permission Sought/Granted                  Emotional Assessment              Admission diagnosis:  Hypoxia [R09.02] CAP (community acquired pneumonia) [J18.9] AKI (acute kidney injury) ([place]) [N17.9] Multifocal pneumonia [J18.9] Dyspnea, unspecified type [R06.00] Patient Active Problem List   Diagnosis Date Noted  . CAP (community acquired pneumonia) 02/24/2019  . Renal failure (ARF), acute on chronic (HCC) 10/16/2018  . Rectal bleeding   . GI bleed 02/23/2015  . Acute blood loss anemia 02/23/2015   PCP:  Idelle Crouch, MD Pharmacy:   [facility], [place] [address] Phone: [phone] Fax: [phone]  [place], [place] - [address] [place] [address] Phone: [phone] Fax: [phone]     Social Determinants of Health (SDOH) Interventions    Readmission Risk Interventions No flowsheet data found.

## 2019-02-27 NOTE — Care Management Important Message (Signed)
Important Message  Patient Details  Name: Eric Gill MRN: [id number] Date of Birth: 1950/05/25   Medicare Important Message Given:  Yes     Dannette Barbara 02/27/2019, 11:33 AM

## 2019-02-27 NOTE — Progress Notes (Signed)
[place], [place] 02/27/19  Subjective:   Hospital day # 3  Feels better today Requiring O2 by [place] - does not wear O2 at home Able to eat breakfast without nausea or vomiting Still feel like his breathing is not back to normal Renal: 01/19 0701 - 01/20 0700 In: 802.8 [I.V.:602.8; IV Piggyback:200] Out: 975 [Urine:975] Lab Results  Component Value Date   CREATININE 3.57 (H) 02/27/2019   CREATININE 3.48 (H) 02/26/2019   CREATININE 3.81 (H) 02/25/2019     Objective:  Vital signs in last 24 hours:  Temp:  [98 F (36.7 C)-99.9 F (37.7 C)] 98 F (36.7 C) (01/20 0931) Pulse Rate:  [70-83] 74 (01/20 0931) Resp:  [16-20] 19 (01/20 0931) BP: (134-151)/(68-83) 151/83 (01/20 0931) SpO2:  [84 %-99 %] 92 % (01/20 1025) Weight:  [54.7 kg] 54.7 kg (01/20 0442)  Weight change:  Filed Weights   02/24/19 0018 02/25/19 0500 02/27/19 0442  Weight: 63.5 kg 63.5 kg 54.7 kg    Intake/Output:    Intake/Output Summary (Last 24 hours) at 02/27/2019 1119 Last data filed at 02/27/2019 0600 Gross per 24 hour  Intake 802.8 ml  Output 975 ml  Net -172.2 ml     Physical Exam: General: NAD, Laying in bed   HEENT Anicteric, moist oral mucus membranes  Pulm/lungs [place] O2, Coarse breath sounds  CVS/Heart regular  Abdomen:  Soft, NT  Extremities: No edema  Neurologic: Alert, orieinted  Skin: Scattered eccymosis   foley in place       Basic Metabolic Panel:  Recent Labs  Lab 02/24/19 0020 02/24/19 0020 02/24/19 0440 02/24/19 0440 02/25/19 0511 02/26/19 0618 02/27/19 0419  NA 137  --  136  --  139 142 141  K 4.5  --  4.3  --  3.5 3.7 4.2  CL 106  --  108  --  107 111 111  CO2 23  --  23  --  21* 20* 21*  GLUCOSE 101*  --  94  --  93 87 84  BUN 45*  --  45*  --  45* 36* 31*  CREATININE 4.07*  --  3.66*  --  3.81* 3.48* 3.57*  CALCIUM 9.2   < > 8.2*   < > 8.5* 8.6* 8.9  MG  --   --   --   --  1.9 2.0 2.0   < > = values in this interval not  displayed.     CBC: Recent Labs  Lab 02/24/19 0020 02/24/19 0440 02/25/19 0511 02/26/19 0618 02/27/19 0419  WBC 8.7 9.2 8.3 6.2 5.5  NEUTROABS 7.0 7.3 6.5 4.7 3.8  HGB 9.8* 8.1* 8.0* 7.4* 7.3*  HCT 31.8* 26.5* 25.7* 25.0* 24.5*  MCV 91.1 91.1 89.5 91.9 91.8  PLT 141* 113* 120* 117* 120*     No results found for: HEPBSAG, HEPBSAB, HEPBIGM    Microbiology:  Recent Results (from the past 240 hour(s))  Blood Culture (routine x 2)     Status: None (Preliminary result)   Collection Time: 02/24/19 12:20 AM   Specimen: BLOOD  Result Value Ref Range Status   Specimen Description BLOOD LEFT FOREARM  Final   Special Requests   Final    BOTTLES DRAWN AEROBIC AND ANAEROBIC Blood Culture results may not be optimal due to an excessive volume of blood received in culture bottles   Culture   Final    NO GROWTH 3 DAYS Performed at [facility], [address] [place], [place] [address]    Report Status PENDING  Incomplete  Blood Culture (routine x 2)     Status: None (Preliminary result)   Collection Time: 02/24/19 12:20 AM   Specimen: BLOOD  Result Value Ref Range Status   Specimen Description BLOOD  Final   Special Requests   Final    BOTTLES DRAWN AEROBIC AND ANAEROBIC Blood Culture results may not be optimal due to an excessive volume of blood received in culture bottles   Culture   Final    NO GROWTH 3 DAYS Performed at [facility], [address] [place], [place] [address]    Report Status PENDING  Incomplete  Urine culture     Status: None   Collection Time: 02/24/19 12:20 AM   Specimen: In/Out Cath Urine  Result Value Ref Range Status   Specimen Description   Final    IN/OUT CATH URINE Performed at [facility], [address], [place], [place] [address]    Special Requests   Final    NONE Performed at [facility], [address], [place], [place] [address]    Culture   Final    NO GROWTH Performed at [facility], [place] [address], [place], [place] [address]    Report Status 02/25/2019 FINAL  Final  Respiratory Panel by RT PCR (Flu A&B, Covid) - Nasopharyngeal Swab     Status: None   Collection Time: 02/24/19 12:20 AM   Specimen: Nasopharyngeal Swab  Result Value Ref Range Status   SARS Coronavirus 2 by RT PCR NEGATIVE NEGATIVE Final    Comment: (NOTE) SARS-CoV-2 target nucleic acids are NOT DETECTED. The SARS-CoV-2 RNA is generally detectable in upper respiratoy specimens during the acute phase of infection. The lowest concentration of SARS-CoV-2 viral copies this assay can detect is 131 copies/mL. A negative result does not preclude SARS-Cov-2 infection and should not be used as the sole basis for treatment or other patient management decisions. A negative result may occur with  improper specimen collection/handling, submission of specimen other than nasopharyngeal swab, presence of viral mutation(s) within the areas targeted by this assay, and inadequate number of viral copies (<131 copies/mL). A negative result must be combined with clinical observations, patient history, and epidemiological information. The expected result is Negative. Fact Sheet for Patients:  PinkCheek.be Fact Sheet for Healthcare Providers:  GravelBags.it This test is not yet ap proved or cleared by the [place] FDA and  has been authorized for detection and/or diagnosis of SARS-CoV-2 by FDA under an Emergency Use Authorization (EUA). This EUA will remain  in effect (meaning this test can be used) for the duration of the COVID-19 declaration under Section 564(b)(1) of the Act, 21 U.S.C. section 360bbb-3(b)(1), unless the authorization is terminated or revoked sooner.    Influenza A by PCR NEGATIVE NEGATIVE Final   Influenza B by PCR NEGATIVE NEGATIVE Final    Comment: (NOTE) The Xpert Xpress SARS-CoV-2/FLU/RSV assay is intended as an aid in  the  diagnosis of influenza from Nasopharyngeal swab specimens and  should not be used as a sole basis for treatment. Nasal washings and  aspirates are unacceptable for Xpert Xpress SARS-CoV-2/FLU/RSV  testing. Fact Sheet for Patients: PinkCheek.be Fact Sheet for Healthcare Providers: GravelBags.it This test is not yet approved or cleared by the [place] FDA and  has been authorized for detection and/or diagnosis of SARS-CoV-2 by  FDA under an Emergency Use Authorization (EUA). This EUA will remain  in  effect (meaning this test can be used) for the duration of the  Covid-19 declaration under Section 564(b)(1) of the Act, 21  U.S.C. section 360bbb-3(b)(1), unless the authorization is  terminated or revoked. Performed at [facility], [address] [place], [place] [address]     Coagulation Studies: Recent Labs    02/25/19 0511 02/26/19 0618 02/27/19 0419  LABPROT 23.1* 22.4* 23.8*  INR 2.1* 2.0* 2.1*    Urinalysis: No results for input(s): COLORURINE, LABSPEC, PHURINE, GLUCOSEU, HGBUR, BILIRUBINUR, KETONESUR, PROTEINUR, UROBILINOGEN, NITRITE, LEUKOCYTESUR in the last 72 hours.  Invalid input(s): APPERANCEUR    Imaging: NM Pulmonary Perfusion  Result Date: 02/25/2019 CLINICAL DATA:  Shortness of breath, pneumonia, COVID negative, history of renal transplant EXAM: NUCLEAR MEDICINE PERFUSION LUNG SCAN TECHNIQUE: Perfusion images were obtained in multiple projections after intravenous injection of radiopharmaceutical. Ventilation scans intentionally deferred if perfusion scan and chest x-ray adequate for interpretation during COVID 19 epidemic. RADIOPHARMACEUTICALS:  3.78 mCi Tc-24m MAA IV COMPARISON:  None Correlation: Chest radiograph and chest CT of 02/24/2019 FINDINGS: Patchy nonsegmental diminished perfusion LEFT lower lobe. Fluid within RIGHT major fissure. No other perfusion defects identified. Ventilation  exam not performed. Overall, perfusion appears much better than expected based on the BILATERAL pleural effusions and lower lobe consolidation seen on CT of 02/24/2019. Findings represent a low probability for pulmonary embolism. IMPRESSION: Low probability of pulmonary embolism. Electronically Signed   By: Lavonia Dana M.D.   On: 02/25/2019 13:40     Medications:   . sodium chloride 250 mL (02/26/19 0323)  . azithromycin 500 mg (02/27/19 0434)  . cefTRIAXone (ROCEPHIN)  IV 2 g (02/27/19 0954)  . heparin 1,050 Units/hr (02/27/19 0435)   . amLODipine  10 mg Oral Daily  . buPROPion  300 mg Oral Daily  . calcitRIOL  0.25 mcg Oral QHS  . citalopram  20 mg Oral Daily  . dicyclomine  10 mg Oral TID AC  . donepezil  10 mg Oral QHS  . epoetin (EPOGEN/PROCRIT) injection  10,000 Units Subcutaneous Weekly  . fluorouracil   Topical BID  . guaiFENesin  600 mg Oral BID  . labetalol  200 mg Oral BID  . magnesium oxide  400 mg Oral BID  . mouth rinse  15 mL Mouth Rinse BID  . multivitamin with minerals  1 tablet Oral Daily  . mycophenolate  250 mg Oral q morning - 10a   And  . mycophenolate  500 mg Oral QHS  . ondansetron (ZOFRAN) IV  4 mg Intravenous Q6H  . pantoprazole  40 mg Oral Daily  . potassium chloride SA  10 mEq Oral BID  . predniSONE  5 mg Oral Daily  . tacrolimus  0.5 mg Oral BID  . Warfarin - Pharmacist Dosing Inpatient   Does not apply q1800   sodium chloride, ALPRAZolam, benzonatate, diphenoxylate-atropine, ipratropium-albuterol, labetalol, sodium chloride  Assessment/ Plan:  69 y.o. male with living non related donor (wife) kidney transplant 2001, immunosuppression( tacrolimus, prednisone and mycophenolate) , stroke,TIA, HTN, hyperlipidemia, DVT, PAD, vascular dementia, hyperparathyroidism admitted on 02/24/2019 for Hypoxia [R09.02] CAP (community acquired pneumonia) [J18.9] AKI (acute kidney injury) ([place]) [N17.9] Multifocal pneumonia [J18.9] Dyspnea, unspecified type  [R06.00]  1. AKI- elevated creatinine of 3.81 (base 2.71 09/20) and BUN of 45  secondary to recent IV contrast use on 01/12 combined with current infection and possible CHF excerbation.  - may have established a new baseline creatinine/GFR 16 - changed to regular diet per patient request  2. Post status  left Kidney transplant ([facility]) (2001) , stage IV CKD - Continue home dose of  immunosuppression( tacrolimus, prednisone and mycophenolate) -followed by Dr Alger Simons at [facility]  3. Anemia of CKD- normocytic anemia likely from chronic kidney disease  - continue to monitor closely -give sub q EPO weekly (TUE)  4. Shortness of breath  atypical PNA  versus developing pulmonary edema. - continue antibiotic avoid nephrotoxic medications   -  Echo 1/18= LVEF 55-60%, LVH - repeat CXR in AM   LOS: [place] 1/20/202111:19 [place], [place]  Note: This note was prepared with Dragon dictation. Any transcription errors are unintentional

## 2019-02-27 NOTE — Progress Notes (Addendum)
[facility] Progress Note  Patient: Eric Gill    [id number]  DOA: 02/24/2019     Date of Service: the patient was seen and examined on 02/27/2019  Chief Complaint  Patient presents with  . Shortness of Breath  . Fever   Brief hospital course: Jaymarion Chase  is a 69 y.o. male with a known history of multiple medical problems that are mentioned below, who presented to the emergency room with onset of worsening dyspnea since yesterday with associated dry cough and wheezing.  He admitted to fever and chills.  Not any chest pain or palpitations.  No nausea vomiting or diarrhea or abdominal pain.  When asked about COVID-19 exposure he stated that he was in the emergency room last week and stayed about 13 hours.  He denied any bleeding diathesis.  No dysuria, oliguria or hematuria or flank pain.  Upon presentation to the emergency room, blood pressure was 189/90 with a pulse 104 respiratory 24 and pulse which was 9 9% on her percent nonrebreather with a temperature of 98.7.  It was then tapered to 6 L of O2 by nasal cannula.  Labs were remarkable for a BUN of 45 and creatinine 4.07 compared to 37 and 3 on 10/30/2018.  Is LDH was 243 and ferritin 303.  Lactic acid 0.9 procalcitonin 0.17 and CBC showed anemia that is close to his previous levels.  Urinalysis came back negative.  Blood cultures were drawn.  Portable chest x-ray showed cardiomegaly and prominent bilateral interstitial lung markings with focal airspace opacities in the right mid and right lower lung zone is concerning for an atypical infectious process versus developing pulmonary edema.  Patient was given 1 g of p.o. Tylenol, IV cefepime, vancomycin and Flagyl and 1 L IV normal saline.  The patient will be admitted to medical monitored bed for further evaluation and management.  Currently further plan is continue to treat pneumonia.  Assessment and Plan: 1.  Community-acquired pneumonia Acute hypoxic respiratory failure,  improved COVID-19 is negative. Continue with Rocephin and azithromycin.  Supportive measures with Tessalon Perles  # COPD  # Prior smoking hx, 30 pack years. --Pt follows with pulm Dr. Raul Del with [facility].  Has in the past needed short-term supplemental oxygen for acute respiratory illness or sepsis.  2.  Acute kidney injury. Chronic kidney disease stage IV. History of renal transplant Immunosuppressed Appreciate nephrology input. Recommend continuing Prograf CellCept and prednisone at current dose. Acute kidney injury likely in the setting of pneumonia. Does not appear to have any volume overload right now. No further diuresis for now. We will continue to monitor daily in and out and daily weight  3.  Elevated BNP Continue to monitor for now. Recognizes all in the setting of pneumonia.  Monitor.  4.  Essential hypertension. Continue blood pressure medications.  5.  History of depression Currently no suicidal homicidal ideation. Stable. Continue Celexa  6.  History of antiphospholipid syndrome Chronic anticoagulation with warfarin [name] on warfarin.  Currently INR subtherapeutic. Bridging the patient with therapeutic IV heparin while INR is subtherapeutic.  Pharmacy monitoring INR.  Diet: Regular diet per patient request DVT Prophylaxis: Therapeutic Anticoagulation with Heparin   Advance goals of care discussion: Full code  Family Communication: updated wife at bedside   Disposition:  Discharge to home, when cleared by nephrology and improvement in oxygenation.  Likely tomorrow.  Subjective:  Doing well today, no complaints.  Eating normally.  No fever, dyspnea, chest pain, abdominal pain, N/V/D, dysuria, increased  swelling.   Physical Exam: Constitutional: NAD, AAOx3, saying strange things per wife, but pt is self-aware, and responding to questions appropriately. HEENT: conjunctivae and lids normal, EOMI CV: RRR no M,R,G. Distal pulses +2.  No cyanosis.    RESP: CTA B/L, normal respiratory effort  GI: +BS, NTND Extremities: No effusions, edema, or tenderness in BLE.  Left toes blue with extensive ecchymosis.   SKIN: warm, dry and intact Neuro: II - XII grossly intact.  Sensation intact Psych: Normal mood and affect.     Vitals:   02/27/19 1018 02/27/19 1024 02/27/19 1025 02/27/19 1207  BP:    140/72  Pulse:    74  Resp:    20  Temp:    98.1 F (36.7 C)  TempSrc:    Oral  SpO2: 93% (!) 84% 92% 93%  Weight:      Height:        Intake/Output Summary (Last 24 hours) at 02/27/2019 1931 Last data filed at 02/27/2019 1500 Gross per 24 hour  Intake -  Output 1025 ml  Net -1025 ml   Filed Weights   02/24/19 0018 02/25/19 0500 02/27/19 0442  Weight: 63.5 kg 63.5 kg 54.7 kg    Data Reviewed: I have personally reviewed and interpreted daily labs, tele strips, imagings as discussed above. I reviewed all nursing notes, pharmacy notes, vitals, pertinent old records I have discussed plan of care as described above with RN and patient/family.  CBC: Recent Labs  Lab 02/24/19 0020 02/24/19 0440 02/25/19 0511 02/26/19 0618 02/27/19 0419  WBC 8.7 9.2 8.3 6.2 5.5  NEUTROABS 7.0 7.3 6.5 4.7 3.8  HGB 9.8* 8.1* 8.0* 7.4* 7.3*  HCT 31.8* 26.5* 25.7* 25.0* 24.5*  MCV 91.1 91.1 89.5 91.9 91.8  PLT 141* 113* 120* 117* [id number]*   Basic Metabolic Panel: Recent Labs  Lab 02/24/19 0020 02/24/19 0440 02/25/19 0511 02/26/19 0618 02/27/19 0419  NA 137 136 139 142 141  K 4.5 4.3 3.5 3.7 4.2  CL 106 108 107 111 111  CO2 23 23 21* 20* 21*  GLUCOSE 101* 94 93 87 84  BUN 45* 45* 45* 36* 31*  CREATININE 4.07* 3.66* 3.81* 3.48* 3.57*  CALCIUM 9.2 8.2* 8.5* 8.6* 8.9  MG  --   --  1.9 2.0 2.0    Studies: No results found.  Scheduled Meds: . amLODipine  10 mg Oral Daily  . buPROPion  300 mg Oral Daily  . calcitRIOL  0.25 mcg Oral QHS  . citalopram  20 mg Oral Daily  . dicyclomine  10 mg Oral TID AC  . donepezil  10 mg Oral QHS  . epoetin  (EPOGEN/PROCRIT) injection  10,000 Units Subcutaneous Weekly  . fluorouracil   Topical BID  . guaiFENesin  600 mg Oral BID  . labetalol  200 mg Oral BID  . magnesium oxide  400 mg Oral BID  . mouth rinse  15 mL Mouth Rinse BID  . multivitamin with minerals  1 tablet Oral Daily  . mycophenolate  250 mg Oral q morning - 10a   And  . mycophenolate  500 mg Oral QHS  . ondansetron (ZOFRAN) IV  4 mg Intravenous Q6H  . pantoprazole  40 mg Oral Daily  . potassium chloride SA  10 mEq Oral BID  . predniSONE  5 mg Oral Daily  . tacrolimus  0.5 mg Oral BID  . Warfarin - Pharmacist Dosing Inpatient   Does not apply q1800   Continuous Infusions: . sodium chloride  250 mL (02/26/19 0323)  . azithromycin 500 mg (02/27/19 0434)  . cefTRIAXone (ROCEPHIN)  IV 2 g (02/27/19 0954)  . heparin 1,050 Units/hr (02/27/19 0435)   PRN Meds: sodium chloride, ALPRAZolam, benzonatate, diphenoxylate-atropine, ipratropium-albuterol, labetalol, sodium chloride   Author: Enzo Bi, MD [facility] 02/27/2019 7:31 PM  To reach On-call, see care teams to locate the attending and reach out to them via [email or web address]. If 7PM-7AM, please contact night-coverage If you still have difficulty reaching the attending provider, please page the [facility] (Director on Call) for [facility] on amion for assistance.

## 2019-02-27 NOTE — Progress Notes (Signed)
[place] for heparin/warfarin Indication: antiphospholipid syndrome w/ h/o DVTs  Patient Measurements: Height: 5\' 3"  (160 cm) Weight: 120 lb 9.5 oz (54.7 kg) IBW/kg (Calculated) : 56.9 Heparin Dosing Weight: 63.5 kg  Vital Signs: Temp: 98.7 F (37.1 C) (01/20 0636) Temp Source: Oral (01/20 0636) BP: 149/68 (01/20 0636) Pulse Rate: 83 (01/20 0636)  Labs: Recent Labs    02/25/19 0511 02/25/19 0511 02/25/19 1413 02/25/19 2157 02/26/19 0618 02/27/19 0419  HGB 8.0*   < >  --   --  7.4* 7.3*  HCT 25.7*  --   --   --  25.0* 24.5*  PLT 120*  --   --   --  117* 120*  APTT >160*  --   --   --   --   --   LABPROT 23.1*  --   --   --  22.4* 23.8*  INR 2.1*  --   --   --  2.0* 2.1*  HEPARINUNFRC  --   --    < > 0.33 0.35 0.37  CREATININE 3.81*  --   --   --  3.48* 3.57*   < > = values in this interval not displayed.    Estimated Creatinine Clearance: 14.9 mL/min (A) (by C-G formula based on SCr of 3.57 mg/dL (H)).   Medical History: Past Medical History:  Diagnosis Date  . Anemia   . Antiphospholipid antibody syndrome ([place])   . Arthritis   . DVT (deep venous thrombosis) ([place])   . Essential hypertension   . Gastric ulcer   . GERD (gastroesophageal reflux disease)   . History of CMV   . Hyperlipidemia   . Hyperparathyroidism ([place])   . Pedal edema   . Peripheral vascular disease ([place])   . Rosacea   . Status post kidney transplant 2001  . Stroke ([place])   . TIA (transient ischemic attack)   . Vascular dementia without behavioral disturbance (HCC)     Medications:  Scheduled:  . amLODipine  10 mg Oral Daily  . buPROPion  300 mg Oral Daily  . calcitRIOL  0.25 mcg Oral QHS  . citalopram  20 mg Oral Daily  . dicyclomine  10 mg Oral TID AC  . donepezil  10 mg Oral QHS  . epoetin (EPOGEN/PROCRIT) injection  10,000 Units Subcutaneous Weekly  . fluorouracil   Topical BID  . guaiFENesin  600 mg Oral BID  . labetalol  200 mg Oral BID   . magnesium oxide  400 mg Oral BID  . mouth rinse  15 mL Mouth Rinse BID  . multivitamin with minerals  1 tablet Oral Daily  . mycophenolate  250 mg Oral q morning - 10a   And  . mycophenolate  500 mg Oral QHS  . ondansetron (ZOFRAN) IV  4 mg Intravenous Q6H  . pantoprazole  40 mg Oral Daily  . potassium chloride SA  10 mEq Oral BID  . predniSONE  5 mg Oral Daily  . tacrolimus  0.5 mg Oral BID  . Warfarin - Pharmacist Dosing Inpatient   Does not apply q1800    Assessment: Patient arrives for SOB and feeling weak w/ h/o antiphospholipid syndrome w/ h/o DVTs being anticoagulated w/ warfarin PTA as follows: Warfarin 3 mg MF Warfarin 2 mg all other days See [facility]agulation clinic at [facility] that has placed him on a goal INR of 2.5 - 3.0. baseline aPTT 68s, H&H trending down, platelets low at baseline but appear to  be stable, no reported bleeding  Heparin Course: 1/17 1546 HL 0.60, therapeutic x 1 1/18 0000 HL 0.30, rate increased to 1050 units/hr 1/18 1413 HL 0.35: no change 1/18 2157 HL 0.33: no change 1/19 0618 HL 0.35: no change 1/20 0419 HL 0.37: no change  Date    INR    Dose 1/17    1.4    3mg   1/18    2.1    Held 1/19    2.0    3 mg 1/20    2.1    4 mg  DDIs: azithromycin, ceftriaxone  Goal of Therapy:  INR 2.5 -3.0  Heparin level goal 0.3 - 0.7 Monitor platelets by anticoagulation protocol: Yes   Plan:   administer warfarin 4 mg tonight (this is approximately 50% greater than his average daily dose  Continue to bridge w/ heparin until 2 consecutive INRs within 2.5 - 3.0  Repeat heparin level in am  daily CBC with AM labs per protocol.  Vallery Sa, PharmD Clinical Pharmacist 02/27/2019 8:31 AM

## 2019-02-27 NOTE — Progress Notes (Signed)
[place] for heparin/warfarin Indication: antiphospholipid syndrome w/ h/o DVTs  Patient Measurements: Height: 5\' 3"  (160 cm) Weight: 120 lb 9.5 oz (54.7 kg) IBW/kg (Calculated) : 56.9 Heparin Dosing Weight: 63.5 kg  Vital Signs: Temp: 98.9 F (37.2 C) (01/20 0242) Temp Source: Oral (01/20 0242) BP: 134/71 (01/20 0242) Pulse Rate: 76 (01/20 0242)  Labs: Recent Labs    02/25/19 0511 02/25/19 0511 02/25/19 1413 02/25/19 2157 02/26/19 0618 02/27/19 0419  HGB 8.0*   < >  --   --  7.4* 7.3*  HCT 25.7*  --   --   --  25.0* 24.5*  PLT 120*  --   --   --  117* 120*  APTT >160*  --   --   --   --   --   LABPROT 23.1*  --   --   --  22.4* 23.8*  INR 2.1*  --   --   --  2.0* 2.1*  HEPARINUNFRC  --   --    < > 0.33 0.35 0.37  CREATININE 3.81*  --   --   --  3.48*  --    < > = values in this interval not displayed.    Estimated Creatinine Clearance: 15.3 mL/min (A) (by C-G formula based on SCr of 3.48 mg/dL (H)).   Medical History: Past Medical History:  Diagnosis Date  . Anemia   . Antiphospholipid antibody syndrome ([place])   . Arthritis   . DVT (deep venous thrombosis) ([place])   . Essential hypertension   . Gastric ulcer   . GERD (gastroesophageal reflux disease)   . History of CMV   . Hyperlipidemia   . Hyperparathyroidism ([place])   . Pedal edema   . Peripheral vascular disease ([place])   . Rosacea   . Status post kidney transplant 2001  . Stroke ([place])   . TIA (transient ischemic attack)   . Vascular dementia without behavioral disturbance (HCC)     Medications:  Scheduled:  . amLODipine  10 mg Oral Daily  . buPROPion  300 mg Oral Daily  . calcitRIOL  0.25 mcg Oral QHS  . citalopram  20 mg Oral Daily  . dicyclomine  10 mg Oral TID AC  . donepezil  10 mg Oral QHS  . epoetin (EPOGEN/PROCRIT) injection  10,000 Units Subcutaneous Weekly  . fluorouracil   Topical BID  . guaiFENesin  600 mg Oral BID  . labetalol  200 mg Oral BID   . magnesium oxide  400 mg Oral BID  . mouth rinse  15 mL Mouth Rinse BID  . multivitamin with minerals  1 tablet Oral Daily  . mycophenolate  250 mg Oral q morning - 10a   And  . mycophenolate  500 mg Oral QHS  . ondansetron (ZOFRAN) IV  4 mg Intravenous Q6H  . pantoprazole  40 mg Oral Daily  . potassium chloride SA  10 mEq Oral BID  . predniSONE  5 mg Oral Daily  . tacrolimus  0.5 mg Oral BID  . Warfarin - Pharmacist Dosing Inpatient   Does not apply q1800    Assessment: Patient arrives for SOB and feeling weak w/ h/o antiphospholipid syndrome w/ h/o DVTs being anticoagulated w/ warfarin PTA as follows: Warfarin 3 mg MF Warfarin 2 mg all other days See anticoagulation clinic at [facility] that has placed him on a goal INR of 2.5 - 3.0. baseline aPTT 68s, H&H trending down, platelets low at baseline but  appear to be stable, no reported bleeding  Heparin Course: 1/17 1546 HL 0.60, therapeutic x 1 1/18 0000 HL 0.30, rate increased to 1050 units/hr 1/18 1413 HL 0.35: no change 1/18 2157 HL 0.33: no change 1/19 0618 HL 0.35: no change 1/20 0419 HL 0.37: no change  Date    INR    Dose 1/17    1.4    3mg   1/18    2.1    Held 1/19    2.0    3 mg  DDIs: azithromycin, ceftriaxone  Goal of Therapy:  INR 2.5 -3.0  Heparin level goal 0.3 - 0.7 Monitor platelets by anticoagulation protocol: Yes   Plan:   Continue to bridge w/ heparin until 2 consecutive INRs within 2.5 - 3.0  Continue Heparin at current rate, f/u later today for Warfarin dosing  daily CBC with AM labs per protocol.  Hart Robinsons, PharmD Clinical Pharmacist 02/27/2019 5:46 AM

## 2019-02-27 NOTE — Progress Notes (Signed)
Patient moved closer to nurses station due to increased confusion and agitation.

## 2019-02-28 ENCOUNTER — Inpatient Hospital Stay: Payer: Medicare Other

## 2019-02-28 LAB — BASIC METABOLIC PANEL
Anion gap: 10 (ref 5–15)
BUN: 28 mg/dL — ABNORMAL HIGH (ref 8–23)
CO2: 22 mmol/L (ref 22–32)
Calcium: 9.5 mg/dL (ref 8.9–10.3)
Chloride: 108 mmol/L (ref 98–111)
Creatinine, Ser: 3.47 mg/dL — ABNORMAL HIGH (ref 0.61–1.24)
GFR calc Af Amer: 20 mL/min — ABNORMAL LOW (ref >60)
GFR calc non Af Amer: 17 mL/min — ABNORMAL LOW (ref >60)
Glucose, Bld: 87 mg/dL (ref 70–99)
Potassium: 4.6 mmol/L (ref 3.5–5.1)
Sodium: 140 mmol/L (ref 135–145)

## 2019-02-28 LAB — CBC
HCT: 25.9 % — ABNORMAL LOW (ref 39.0–52.0)
Hemoglobin: 8 g/dL — ABNORMAL LOW (ref 13.0–17.0)
MCH: 28.2 pg (ref 26.0–34.0)
MCHC: 30.9 g/dL (ref 30.0–36.0)
MCV: 91.2 fL (ref 80.0–100.0)
Platelets: 128 10*3/uL — ABNORMAL LOW (ref 150–400)
RBC: 2.84 MIL/uL — ABNORMAL LOW (ref 4.22–5.81)
RDW: 14.2 % (ref 11.5–15.5)
WBC: 5.8 10*3/uL (ref 4.0–10.5)
nRBC: 0 % (ref 0.0–0.2)

## 2019-02-28 LAB — PROTIME-INR
INR: 2.2 — ABNORMAL HIGH (ref 0.8–1.2)
Prothrombin Time: 24.5 seconds — ABNORMAL HIGH (ref 11.4–15.2)

## 2019-02-28 LAB — MAGNESIUM: Magnesium: 1.9 mg/dL (ref 1.7–2.4)

## 2019-02-28 LAB — HEPARIN LEVEL (UNFRACTIONATED): Heparin Unfractionated: 0.63 IU/mL (ref 0.30–0.70)

## 2019-02-28 MED ORDER — POTASSIUM CHLORIDE ER 10 MEQ PO TBCR: EXTENDED_RELEASE_TABLET | ORAL | Status: AC

## 2019-02-28 MED ORDER — FUROSEMIDE 40 MG PO TABS: ORAL_TABLET | ORAL | Status: AC

## 2019-02-28 MED ORDER — PANTOPRAZOLE SODIUM 40 MG PO TBEC: 40.0000 mg | DELAYED_RELEASE_TABLET | Freq: Every day | ORAL | Status: AC

## 2019-02-28 NOTE — Progress Notes (Signed)
Discharge order received. Patient mental status is a little confused. MD aware. Vital signs stable . No signs of acute distress. Discharge instructions given. Patient verbalized understanding. No other issues noted at this time.

## 2019-02-28 NOTE — Progress Notes (Signed)
SATURATION QUALIFICATIONS: (This note is used to comply with regulatory documentation for home oxygen)  Patient Saturations on Room Air at Rest 91 %  Patient Saturations on Room Air while Ambulating =82 %  Patient Saturations on 1L Liters of oxygen while Ambulating = 88  Please briefly explain why patient needs home oxygen: per MD okay for pt to be on 1L O2 sat of 88 %.

## 2019-02-28 NOTE — TOC Initial Note (Signed)
Transition of Care [facility]) - Initial/Assessment Note    Patient Details  Name: Eric Gill MRN: [id number] Date of Birth: 08-26-1950  [facility] CM/SW Contact:    Beverly Sessions, RN Phone Number: 02/28/2019, 1:40 PM  Clinical Narrative:                 Patient admitted from home with CAP  Assessment completed via phone with wife PCP Doy Hutching - wife transports   PT has seen patient and recommended home health PT. Wife agreeable.  States they have used [facility] in the past and would like to use them again.  Referral made to [facility] with [place]  MD to order home health PT, bedside commode and home O2.   Brad with [employer] to deliver equipment prior to discharge   Expected Discharge Plan: [place] Services Barriers to Discharge: No Barriers Identified   Patient Goals and CMS Choice   CMS Medicare.gov Compare Post Acute Care list provided to:: Patient Represenative (must comment) Choice offered to / list presented to : Spouse  Expected Discharge Plan and Services Expected Discharge Plan: [place]   Discharge Planning Services: CM Consult Post Acute Care Choice: Durable Medical Equipment, Home Health Living arrangements for the past 2 months: Single Family Home Expected Discharge Date: 02/28/19               DME Arranged: Oxygen, 3-N-1 DME Agency: [employer] Date DME Agency Contacted: 02/27/19   Representative spoke with at DME Agency: [place]: RN, PT [place] Agency: [place] ([place]) Date [place]: 02/27/19   Representative spoke with at [place]  Prior Living Arrangements/Services Living arrangements for the past 2 months: [place] with:: Spouse Patient language and need for interpreter reviewed:: Yes Do you feel safe going back to the place where you live?: Yes      Need for Family Participation in Patient Care: Yes (Comment) Care giver  support system in place?: Yes (comment) Current home services: DME Criminal Activity/Legal Involvement Pertinent to Current Situation/Hospitalization: No - Comment as needed  Activities of Daily Living Home Assistive Devices/Equipment: Gilford Rile (specify type) ADL Screening (condition at time of admission) [name] cognitive ability adequate to safely complete daily activities?: Yes Is the patient deaf or have difficulty hearing?: No Does the patient have difficulty seeing, even when wearing glasses/contacts?: No Does the patient have difficulty concentrating, remembering, or making decisions?: No Patient able to express need for assistance with ADLs?: Yes Does the patient have difficulty dressing or bathing?: No Independently performs ADLs?: Yes (appropriate for developmental age) Does the patient have difficulty walking or climbing stairs?: Yes Weakness of Legs: None Weakness of Arms/Hands: None  Permission Sought/Granted                  Emotional Assessment       Orientation: : Fluctuating Orientation (Suspected and/or reported [place])   Psych Involvement: No (comment)  Admission diagnosis:  Hypoxia [R09.02] CAP (community acquired pneumonia) [J18.9] AKI (acute kidney injury) ([place]) [N17.9] Multifocal pneumonia [J18.9] Dyspnea, unspecified type [R06.00] Patient Active Problem List   Diagnosis Date Noted  . CAP (community acquired pneumonia) 02/24/2019  . Renal failure (ARF), acute on chronic (HCC) 10/16/2018  . Rectal bleeding   . GI bleed 02/23/2015  . Acute blood loss anemia 02/23/2015   PCP:  Idelle Crouch, MD Pharmacy:   [place], [place] -  [address] [place] [address] Phone: [phone] Fax: [phone]  [place], [place] - [address] [place] [address] Phone: [phone] Fax: [phone]     Social Determinants of Health (SDOH) Interventions     Readmission Risk Interventions Readmission Risk Prevention Plan 02/28/2019  Transportation Screening Complete  HRI or Home Care Consult Complete  Palliative Care Screening Not Applicable  Medication Review (RN Care Manager) Complete  Some recent data might be hidden

## 2019-02-28 NOTE — Discharge Summary (Addendum)
Physician Discharge Summary   Eric Gill  male DOB: 19-Mar-1950  [id number]  PCP: Idelle Crouch, MD  Admit date: 02/24/2019 Discharge date:  02/28/2019  Admitted From: home Disposition:  Home.  Wife updated on the discharge plan. Home Health: Yes CODE STATUS: Full code  Discharge Instructions    Diet - low sodium heart healthy   Complete by: As directed    Discharge instructions   Complete by: As directed    Because of your acute kidney injury, please hold Lasix and potassium supplement until you follow up with your kidney doctor.  You currently need 1 liter of supplemental oxygen when you walk around.  Hopefully as you recover from your illness, you will not need oxygen in a couple of weeks.  Your goal oxygen level is 88% and above.  Please follow up with your kidney doctor in 1-2 weeks after discharge.  Dr. Enzo Bi - -   Increase activity slowly   Complete by: As directed        Hospital Course:  For full details, please see H&P, progress notes, consult notes and ancillary notes.  Briefly,  Eric Gill a69 y.o.Caucasian malewith a history of kidney transplant on immunosuppressants, anemia of chronic disease, COPD not on home O2, antiphospholipid syndrome on chronic anticoagulation with warfarin, stroke, vascular dementia, who presented to the emergency room with worsening dyspneasince the day prior with associated dry cough and wheezing. He admitted to fever and chills.   Labs were remarkable for a BUN of 45 and creatinine 4.07 compared to 37 and 3 on 10/30/2018. Lactic acid 0.9 procalcitonin 0.17 and CBC showed anemia that is close to his previous levels. Portable chest x-ray showed cardiomegaly and prominent bilateral interstitial lung markings with focal airspace opacities in the right mid and right lower lung zone is concerning for an atypical infectious process versus developing pulmonary edema.  Patient was given 1 g of p.o. Tylenol, IV  cefepime, vancomycin and Flagyl and 1 L IV normal saline in the ED before admission.  # Community-acquired pneumonia Acute hypoxic respiratory failure, improved On presentation, pt was tachypnic with new O2 requirement of 6L.  COVID-19 is negative.  Abx switched to Rocephin and azithromycin for 5 more days.  Pt was improving with just abx treatment, so did not receive diuretic therapy.  Prior to discharge, pt's hypoxia improved and was satting well on room air, however, desat to 82% with ambulation, requiring 1L to bring sat up to 88%.  Pt was therefore discharge with home O2, and will likely need it for just short-term until he recovers fully from his PNA.  # COPD # Prior smoking hx, 30 pack years. Pt follows with pulm Dr. Raul Del with [facility].  Has in the past needed short-term supplemental oxygen for acute respiratory illness or sepsis.  # Acute kidney injury. # Chronic kidney disease stage IV. # History of renal transplant (living non related donor (wife)kidney transplant 2001) # Immunosuppressed Acute kidney injury secondary to recent IV contrast use on 01/12 combined with current infection.  Did not appear to have volume overload.  Nephrology consulted.  Recommend continuing Prograf CellCept and prednisone at current dose.  May have established a new baseline creatinine 3.5/GFR 17, per nephro.  # Essential hypertension. Continued home blood pressure medications except for Lasix which was held until nephrology followup due to AKI.  # History of depression Stable. Continued Celexa  # History of antiphospholipid syndrome on chronic anticoagulation with warfarin On presentation, INR subtherapeutic. Pt  was bridged with therapeutic IV heparin until INR became therapeutic.  Pharmacy monitoring INR.  On the day of discharge, I discussed with pharmacy and the plan was for pt to go back on home warfarin regimen.  # Anemia of CKD- normocytic anemia  likely from chronic kidney disease.     Receives sub q EPO weekly (TUE)  # Confusion 2/2 hospital delirium # Baseline Vascular dementia Pt had confusion during his hospitalization, especially at night.  During the day, pt was oriented and follows directions well.  Confusion was due to hospital delirium and is expected to improve after discharge back to home familiar environment.   Discharge Diagnoses:  Active Problems:   CAP (community acquired pneumonia)    Discharge Instructions:  Allergies as of 02/28/2019      Reactions   Losartan Cough   Statins Nausea And Vomiting, Other (See Comments)   Reaction:  Muscle pain       Medication List    TAKE these medications   ALPRAZolam 0.25 MG tablet Commonly known as: XANAX Take 0.25 mg by mouth 2 (two) times daily as needed.   amLODipine 10 MG tablet Commonly known as: NORVASC Take 10 mg by mouth daily.   budesonide-formoterol 80-4.5 MCG/ACT inhaler Commonly known as: SYMBICORT Inhale 2 puffs into the lungs 2 (two) times daily.   buPROPion 300 MG 24 hr tablet Commonly known as: WELLBUTRIN XL Take 300 mg by mouth daily.   calcitRIOL 0.25 MCG capsule Commonly known as: ROCALTROL Take 0.25 mcg by mouth at bedtime.   citalopram 20 MG tablet Commonly known as: CELEXA Take 20 mg by mouth daily.   dicyclomine 10 MG capsule Commonly known as: BENTYL Take 10 mg by mouth 3 (three) times daily before meals.   diphenoxylate-atropine 2.5-0.025 MG tablet Commonly known as: LOMOTIL Take 1 tablet by mouth 4 (four) times daily as needed for diarrhea or loose stools.   donepezil 10 MG tablet Commonly known as: ARICEPT Take 10 mg by mouth at bedtime.   fluorouracil 5 % cream Commonly known as: EFUDEX   furosemide 40 MG tablet Commonly known as: LASIX Hold until followup with kidney doctor. What changed:   how much to take  how to take this  when to take this  reasons to take this  additional instructions   Jantoven 1 MG tablet Generic drug:  warfarin Take 2-3 mg by mouth daily. Take two tablets (2mg ) on Sunday, Tuesday,Weds, Thursday and Saturday. Take three tablets (3 mg) on Monday and Friday.   labetalol 200 MG tablet Commonly known as: NORMODYNE Take 200 mg by mouth 2 (two) times daily.   Magnesium Oxide 400 (240 Mg) MG Tabs Take 1 tablet by mouth 2 (two) times daily.   magnesium oxide 400 MG tablet Commonly known as: MAG-OX TAKE ONE TABLET BY MOUTH TWICE DAILY   Multi-Vitamins Tabs Take 1 tablet by mouth daily.   mycophenolate 250 MG capsule Commonly known as: CELLCEPT Take 250-500 mg by mouth 2 (two) times daily. 250 mg in the am and 500 mg at bedtime   pantoprazole 40 MG tablet Commonly known as: PROTONIX Take 1 tablet (40 mg total) by mouth daily.   potassium chloride 10 MEQ tablet Commonly known as: KLOR-CON Hold until follow up with kidney doctor. What changed:   how much to take  how to take this  when to take this  additional instructions   predniSONE 5 MG tablet Commonly known as: DELTASONE Take 5 mg by mouth daily.  tacrolimus 0.5 MG capsule Commonly known as: PROGRAF Take 1 capsule by mouth 2 (two) times a day.       Follow-up Information    Idelle Crouch, MD. Schedule an appointment as soon as possible for a visit in 1 week.   Specialty: Internal Medicine Contact information: [address] [place] [address] [phone]           Allergies  Allergen Reactions  . Losartan Cough  . Statins Nausea And Vomiting and Other (See Comments)    Reaction:  Muscle pain      The results of significant diagnostics from this hospitalization (including imaging, microbiology, ancillary and laboratory) are listed below for reference.   Consultations:   Procedures/Studies: DG Chest 2 View  Result Date: 02/28/2019 CLINICAL DATA:  69 year old male with history of wheezing. Former smoker. EXAM: CHEST - 2 VIEW COMPARISON:  Chest x-ray 02/24/2019. FINDINGS: Lung volumes  are slightly low. There is cephalization of the pulmonary vasculature, indistinctness of the interstitial markings, and patchy airspace disease throughout the lungs bilaterally suggestive of moderate pulmonary edema. Moderate to large right and small left pleural effusions. Heart size is mildly enlarged. The patient is rotated to the right on today's exam, resulting in distortion of the mediastinal contours and reduced diagnostic sensitivity and specificity for mediastinal pathology. Aortic atherosclerosis. IMPRESSION: 1. The appearance the chest is most compatible with congestive heart failure, as above. Electronically Signed   By: Vinnie Langton M.D.   On: 02/28/2019 08:20   CT CHEST WO CONTRAST  Result Date: 02/24/2019 CLINICAL DATA:  Shortness of breath. Hypoxia. Fever. EXAM: CT CHEST WITHOUT CONTRAST TECHNIQUE: Multidetector CT imaging of the chest was performed following the standard protocol without IV contrast. COMPARISON:  One-view chest x-ray 02/24/2019 FINDINGS: Cardiovascular: Heart size is normal. Coronary artery calcifications are present. No significant pericardial effusion is present. Atherosclerotic changes are noted at the aortic arch and origins of the great vessels. There is no aneurysm or significant stenosis. Pulmonary artery size is within. Mediastinum/Nodes: Subcentimeter mediastinal lymph nodes are likely reactive. Lungs/Pleura: Bilateral pleural effusions are present. Bibasilar airspace consolidation is present in the lower lobes, right greater than left. Additional right middle lobe and upper lobe airspace opacities are present, consistent with pneumonia. Bronchi are patent. Left upper lobe is clear. Upper Abdomen: The patient is status post cholecystectomy. Liver and spleen are within normal limits. Kidneys are severely atrophic bilaterally. Acute abnormalities are present. Musculoskeletal: Vertebral body heights and alignment are maintained. No focal lytic or blastic lesions are  present. IMPRESSION: 1. Right middle and lower lobe airspace disease compatible with pneumonia. 2. Bilateral pleural effusions, right greater than left. 3. Coronary artery disease. 4. Severe bilateral renal atrophy. Electronically Signed   By: San Morelle M.D.   On: 02/24/2019 06:51   NM Pulmonary Perfusion  Result Date: 02/25/2019 CLINICAL DATA:  Shortness of breath, pneumonia, COVID negative, history of renal transplant EXAM: NUCLEAR MEDICINE PERFUSION LUNG SCAN TECHNIQUE: Perfusion images were obtained in multiple projections after intravenous injection of radiopharmaceutical. Ventilation scans intentionally deferred if perfusion scan and chest x-ray adequate for interpretation during COVID 19 epidemic. RADIOPHARMACEUTICALS:  3.78 mCi Tc-69m MAA IV COMPARISON:  None Correlation: Chest radiograph and chest CT of 02/24/2019 FINDINGS: Patchy nonsegmental diminished perfusion LEFT lower lobe. Fluid within RIGHT major fissure. No other perfusion defects identified. Ventilation exam not performed. Overall, perfusion appears much better than expected based on the BILATERAL pleural effusions and lower lobe consolidation seen on CT of 02/24/2019. Findings  represent a low probability for pulmonary embolism. IMPRESSION: Low probability of pulmonary embolism. Electronically Signed   By: Lavonia Dana M.D.   On: 02/25/2019 13:40   US RENAL  Result Date: 02/24/2019 CLINICAL DATA:  Acute kidney injury. EXAM: RENAL / URINARY TRACT ULTRASOUND COMPLETE COMPARISON:  October 17, 2018. FINDINGS: Right Kidney: Renal measurements: 5.0 x 2.5 x 2.5 cm = volume: 16 mL. Increased echogenicity is noted consistent with medical renal disease. No mass or hydronephrosis visualized. Left Kidney: Not visualized. Bladder: Appears normal for degree of bladder distention. Other: Renal transplant is noted in left lower quadrant. No definite hydronephrosis is noted. It measures 9.0 x 5.2 x 5.0 cm with calculated size of 122 mL. Grossly  normal echogenicity of renal parenchyma is noted. IMPRESSION: Renal transplant is noted in left lower quadrant. No hydronephrosis is noted. It appears grossly normal in appearance and size. Severely atrophic right kidney is noted consistent with end-stage renal disease. Left kidney is not visualized. Electronically Signed   By: Marijo Conception M.D.   On: 02/24/2019 16:22   DG Chest Port 1 View  Result Date: 02/24/2019 CLINICAL DATA:  Fever and shortness of breath EXAM: PORTABLE CHEST 1 VIEW COMPARISON:  10/21/2018 FINDINGS: There are scattered airspace opacities bilaterally, greatest within the right lower and right mid lung zone. The heart size is enlarged. Prominent interstitial lung markings are noted bilaterally. There is no pneumothorax. There may be small bilateral pleural effusions. There is no acute osseous abnormality. Aortic calcifications are noted. IMPRESSION: Prominent bilateral interstitial lung markings with focal airspace opacities in the right mid and right lower lung zone is concerning for an atypical infectious process versus developing pulmonary edema. Cardiomegaly. Electronically Signed   By: Constance Holster M.D.   On: 02/24/2019 00:42   VAS [place] LOWER EXTREMITY VENOUS (DVT)  Result Date: 02/11/2019  Lower Venous Study Indications: Pain.  Risk Factors: None identified. Comparison Study: No prior studies. Performing Technologist: Oliver Hum RVT  Examination Guidelines: A complete evaluation includes B-mode imaging, spectral Doppler, color Doppler, and power Doppler as needed of all accessible portions of each vessel. Bilateral testing is considered an integral part of a complete examination. Limited examinations for reoccurring indications may be performed as noted.  +-----+---------------+---------+-----------+----------+--------------+ RIGHTCompressibilityPhasicitySpontaneityPropertiesThrombus Aging +-----+---------------+---------+-----------+----------+--------------+ CFV   Full           Yes      Yes                                 +-----+---------------+---------+-----------+----------+--------------+   +---------+---------------+---------+-----------+----------+--------------+ LEFT     CompressibilityPhasicitySpontaneityPropertiesThrombus Aging +---------+---------------+---------+-----------+----------+--------------+ CFV      Full           Yes      Yes                                 +---------+---------------+---------+-----------+----------+--------------+ SFJ      Full                                                        +---------+---------------+---------+-----------+----------+--------------+ FV Prox  Full                                                        +---------+---------------+---------+-----------+----------+--------------+  FV Mid   Full                                                        +---------+---------------+---------+-----------+----------+--------------+ FV DistalFull                                                        +---------+---------------+---------+-----------+----------+--------------+ PFV      Full                                                        +---------+---------------+---------+-----------+----------+--------------+ POP      Full           Yes      Yes                                 +---------+---------------+---------+-----------+----------+--------------+ PTV      Full                                                        +---------+---------------+---------+-----------+----------+--------------+ PERO     Full                                                        +---------+---------------+---------+-----------+----------+--------------+ Incidental findings of a large complex structure of mixed echogenicity within the proximal to mid calf is noted. The structure has no vascularization. Further testing may be warranted.  Left Technical Findings:    Summary: Right: No evidence of common femoral vein obstruction. Left: There is no evidence of deep vein thrombosis in the lower extremity. No cystic structure found in the popliteal fossa. Incidental findings of a large complex structure of mixed echogenicity within the proximal to mid calf is noted. The structure has no vascularization. Further testing may be warranted.  *See table(s) above for measurements and observations. Electronically signed by Harold Barban MD on 02/11/2019 at 3:34:54 PM.    Final       Labs: BNP (last 3 results) Recent Labs    02/24/19 0020  BNP [id number]*   Basic Metabolic Panel: No results for input(s): NA, K, CL, CO2, GLUCOSE, BUN, CREATININE, CALCIUM, MG, PHOS in the last 168 hours. Liver Function Tests: No results for input(s): AST, ALT, ALKPHOS, BILITOT, PROT, ALBUMIN in the last 168 hours. No results for input(s): LIPASE, AMYLASE in the last 168 hours. No results for input(s): AMMONIA in the last 168 hours. CBC: No results for input(s): WBC, NEUTROABS, HGB, HCT, MCV, PLT in the last 168 hours. Cardiac Enzymes: No results for input(s): CKTOTAL, CKMB, CKMBINDEX, TROPONINI in the last 168 hours. BNP: Invalid input(s): POCBNP CBG:  No results for input(s): GLUCAP in the last 168 hours. D-Dimer No results for input(s): DDIMER in the last 72 hours. Hgb A1c No results for input(s): HGBA1C in the last 72 hours. Lipid Profile No results for input(s): CHOL, HDL, LDLCALC, TRIG, CHOLHDL, LDLDIRECT in the last 72 hours. Thyroid function studies No results for input(s): TSH, T4TOTAL, T3FREE, THYROIDAB in the last 72 hours.  Invalid input(s): FREET3 Anemia work up No results for input(s): VITAMINB12, FOLATE, FERRITIN, TIBC, IRON, RETICCTPCT in the last 72 hours. Urinalysis    Component Value Date/Time   COLORURINE YELLOW (A) 02/24/2019 0020   APPEARANCEUR CLEAR (A) 02/24/2019 0020   LABSPEC 1.013 02/24/2019 0020   PHURINE 6.0 02/24/2019 0020   GLUCOSEU  NEGATIVE 02/24/2019 0020   HGBUR SMALL (A) 02/24/2019 0020   BILIRUBINUR NEGATIVE 02/24/2019 0020   KETONESUR NEGATIVE 02/24/2019 0020   PROTEINUR 100 (A) 02/24/2019 0020   NITRITE NEGATIVE 02/24/2019 0020   LEUKOCYTESUR NEGATIVE 02/24/2019 0020   Sepsis Labs Invalid input(s): PROCALCITONIN,  WBC,  LACTICIDVEN Microbiology No results found for this or any previous visit (from the past 240 hour(s)).   Total time spend on discharging this patient, including the last patient exam, discussing the hospital stay, instructions for ongoing care as it relates to all pertinent caregivers, as well as preparing the medical discharge records, prescriptions, and/or referrals as applicable, is 45 minutes.    Enzo Bi, MD  [facility] 03/08/2019, 1:27 AM  If 7PM-7AM, please contact night-coverage

## 2019-02-28 NOTE — Progress Notes (Signed)
[facility], [place] 02/28/19  Subjective:   Hospital day # 4  Feels fair  Able to eat without nausea or vomiting   Renal: 01/20 0701 - 01/21 0700 In: 1488.2 [I.V.:491.5; IV Piggyback:996.7] Out: 350 [Urine:350] Lab Results  Component Value Date   CREATININE 3.47 (H) 02/28/2019   CREATININE 3.57 (H) 02/27/2019   CREATININE 3.48 (H) 02/26/2019     Objective:  Vital signs in last 24 hours:  Temp:  [98.5 F (36.9 C)] 98.5 F (36.9 C) (01/20 1952) Pulse Rate:  [75] 75 (01/20 1952) Resp:  [20] 20 (01/20 1952) BP: (149)/(72) 149/72 (01/20 1952) SpO2:  [96 %] 96 % (01/20 1952)  Weight change:  Filed Weights   02/24/19 0018 02/25/19 0500 02/27/19 0442  Weight: 63.5 kg 63.5 kg 54.7 kg    Intake/Output:    Intake/Output Summary (Last 24 hours) at 02/28/2019 1650 Last data filed at 02/28/2019 1500 Gross per 24 hour  Intake 1848.22 ml  Output 1100 ml  Net 748.22 ml     Physical Exam: General: NAD, Laying in bed   HEENT Anicteric, moist oral mucus membranes  Pulm/lungs  O2, Coarse breath sounds  CVS/Heart regular  Abdomen:  Soft, NT  Extremities: No edema  Neurologic: Alert, orieinted  Skin: Scattered eccymosis           Basic Metabolic Panel:  Recent Labs  Lab 02/24/19 0440 02/24/19 0440 02/25/19 0511 02/25/19 0511 02/26/19 0618 02/27/19 0419 02/28/19 0619  NA 136  --  139  --  142 141 140  K 4.3  --  3.5  --  3.7 4.2 4.6  CL 108  --  107  --  111 111 108  CO2 23  --  21*  --  20* 21* 22  GLUCOSE 94  --  93  --  87 84 87  BUN 45*  --  45*  --  36* 31* 28*  CREATININE 3.66*  --  3.81*  --  3.48* 3.57* 3.47*  CALCIUM 8.2*   < > 8.5*   < > 8.6* 8.9 9.5  MG  --   --  1.9  --  2.0 2.0 1.9   < > = values in this interval not displayed.     CBC: Recent Labs  Lab 02/24/19 0020 02/24/19 0020 02/24/19 0440 02/25/19 0511 02/26/19 0618 02/27/19 0419 02/28/19 0619  WBC 8.7   < > 9.2 8.3 6.2 5.5 5.8  NEUTROABS 7.0  --   7.3 6.5 4.7 3.8  --   HGB 9.8*   < > 8.1* 8.0* 7.4* 7.3* 8.0*  HCT 31.8*   < > 26.5* 25.7* 25.0* 24.5* 25.9*  MCV 91.1   < > 91.1 89.5 91.9 91.8 91.2  PLT 141*   < > 113* 120* 117* 120* 128*   < > = values in this interval not displayed.     No results found for: HEPBSAG, HEPBSAB, HEPBIGM    Microbiology:  Recent Results (from the past 240 hour(s))  Blood Culture (routine x 2)     Status: None (Preliminary result)   Collection Time: 02/24/19 12:20 AM   Specimen: BLOOD  Result Value Ref Range Status   Specimen Description BLOOD LEFT FOREARM  Final   Special Requests   Final    BOTTLES DRAWN AEROBIC AND ANAEROBIC Blood Culture results may not be optimal due to an excessive volume of blood received in culture bottles   Culture   Final    NO  GROWTH 4 DAYS Performed at [facility], [address] [place], [place] [address]    Report Status PENDING  Incomplete  Blood Culture (routine x 2)     Status: None (Preliminary result)   Collection Time: 02/24/19 12:20 AM   Specimen: BLOOD  Result Value Ref Range Status   Specimen Description BLOOD  Final   Special Requests   Final    BOTTLES DRAWN AEROBIC AND ANAEROBIC Blood Culture results may not be optimal due to an excessive volume of blood received in culture bottles   Culture   Final    NO GROWTH 4 DAYS Performed at [facility], [address], [place], [place] [address]    Report Status PENDING  Incomplete  Urine culture     Status: None   Collection Time: 02/24/19 12:20 AM   Specimen: In/Out Cath Urine  Result Value Ref Range Status   Specimen Description   Final    IN/OUT CATH URINE Performed at [facility], [address] [place], [place] [address]    Special Requests   Final    NONE Performed at [facility], [address], [place], [place] [address]    Culture   Final    NO GROWTH Performed at [facility], [address], [place], [place] [address]    Report  Status 02/25/2019 FINAL  Final  Respiratory Panel by RT PCR (Flu A&B, Covid) - Nasopharyngeal Swab     Status: None   Collection Time: 02/24/19 12:20 AM   Specimen: Nasopharyngeal Swab  Result Value Ref Range Status   SARS Coronavirus 2 by RT PCR NEGATIVE NEGATIVE Final    Comment: (NOTE) SARS-CoV-2 target nucleic acids are NOT DETECTED. The SARS-CoV-2 RNA is generally detectable in upper respiratoy specimens during the acute phase of infection. The lowest concentration of SARS-CoV-2 viral copies this assay can detect is 131 copies/mL. A negative result does not preclude SARS-Cov-2 infection and should not be used as the sole basis for treatment or other patient management decisions. A negative result may occur with  improper specimen collection/handling, submission of specimen other than nasopharyngeal swab, presence of viral mutation(s) within the areas targeted by this assay, and inadequate number of viral copies (<131 copies/mL). A negative result must be combined with clinical observations, patient history, and epidemiological information. The expected result is Negative. Fact Sheet for Patients:  PinkCheek.be Fact Sheet for Healthcare Providers:  GravelBags.it This test is not yet ap proved or cleared by the [place] FDA and  has been authorized for detection and/or diagnosis of SARS-CoV-2 by FDA under an Emergency Use Authorization (EUA). This EUA will remain  in effect (meaning this test can be used) for the duration of the COVID-19 declaration under Section 564(b)(1) of the Act, 21 U.S.C. section 360bbb-3(b)(1), unless the authorization is terminated or revoked sooner.    Influenza A by PCR NEGATIVE NEGATIVE Final   Influenza B by PCR NEGATIVE NEGATIVE Final    Comment: (NOTE) The Xpert Xpress SARS-CoV-2/FLU/RSV assay is intended as an aid in  the diagnosis of influenza from Nasopharyngeal swab specimens and   should not be used as a sole basis for treatment. Nasal washings and  aspirates are unacceptable for Xpert Xpress SARS-CoV-2/FLU/RSV  testing. Fact Sheet for Patients: PinkCheek.be Fact Sheet for Healthcare Providers: GravelBags.it This test is not yet approved or cleared by the [place] FDA and  has been authorized for detection and/or diagnosis of SARS-CoV-2 by  FDA under  an Emergency Use Authorization (EUA). This EUA will remain  in effect (meaning this test can be used) for the duration of the  Covid-19 declaration under Section 564(b)(1) of the Act, 21  U.S.C. section 360bbb-3(b)(1), unless the authorization is  terminated or revoked. Performed at [facility], [address] [place], [place] [address]     Coagulation Studies: Recent Labs    02/26/19 0618 02/27/19 0419 02/28/19 0619  LABPROT 22.4* 23.8* 24.5*  INR 2.0* 2.1* 2.2*    Urinalysis: No results for input(s): COLORURINE, LABSPEC, PHURINE, GLUCOSEU, HGBUR, BILIRUBINUR, KETONESUR, PROTEINUR, UROBILINOGEN, NITRITE, LEUKOCYTESUR in the last 72 hours.  Invalid input(s): APPERANCEUR    Imaging: DG Chest 2 View  Result Date: 02/28/2019 CLINICAL DATA:  69 year old male with history of wheezing. Former smoker. EXAM: CHEST - 2 VIEW COMPARISON:  Chest x-ray 02/24/2019. FINDINGS: Lung volumes are slightly low. There is cephalization of the pulmonary vasculature, indistinctness of the interstitial markings, and patchy airspace disease throughout the lungs bilaterally suggestive of moderate pulmonary edema. Moderate to large right and small left pleural effusions. Heart size is mildly enlarged. The patient is rotated to the right on today's exam, resulting in distortion of the mediastinal contours and reduced diagnostic sensitivity and specificity for mediastinal pathology. Aortic atherosclerosis. IMPRESSION: 1. The appearance the chest is most compatible  with congestive heart failure, as above. Electronically Signed   By: Vinnie Langton M.D.   On: 02/28/2019 08:20     Medications:   . sodium chloride 250 mL (02/26/19 0323)   . amLODipine  10 mg Oral Daily  . buPROPion  300 mg Oral Daily  . calcitRIOL  0.25 mcg Oral QHS  . citalopram  20 mg Oral Daily  . dicyclomine  10 mg Oral TID AC  . donepezil  10 mg Oral QHS  . epoetin (EPOGEN/PROCRIT) injection  10,000 Units Subcutaneous Weekly  . fluorouracil   Topical BID  . guaiFENesin  600 mg Oral BID  . labetalol  200 mg Oral BID  . magnesium oxide  400 mg Oral BID  . mouth rinse  15 mL Mouth Rinse BID  . multivitamin with minerals  1 tablet Oral Daily  . mycophenolate  250 mg Oral q morning - 10a   And  . mycophenolate  500 mg Oral QHS  . ondansetron (ZOFRAN) IV  4 mg Intravenous Q6H  . pantoprazole  40 mg Oral Daily  . potassium chloride SA  10 mEq Oral BID  . predniSONE  5 mg Oral Daily  . tacrolimus  0.5 mg Oral BID  . Warfarin - Pharmacist Dosing Inpatient   Does not apply q1800   sodium chloride, ALPRAZolam, benzonatate, diphenoxylate-atropine, ipratropium-albuterol, labetalol, sodium chloride  Assessment/ Plan:  69 y.o. 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 AKI- elevated creatinine of 3.81 (base 2.71 09/20) and BUN of 45  secondary to recent IV contrast use on 01/12 combined with current infection and possible CHF excerbation.  - may have established a new baseline creatinine 3.5/GFR 17 - follow up outpatient  2. Post status left Kidney transplant ([facility]) (2001) , stage IV CKD - Continue home dose of   immunosuppression( tacrolimus, prednisone and mycophenolate) -followed by Dr Alger Simons at [facility]  3. Anemia of CKD-  normocytic anemia likely from chronic kidney disease  - continue to monitor closely -give sub q EPO weekly (TUE) Lab Results  Component Value Date   HGB 8.0 (L) 02/28/2019     4. Shortness of breath  atypical PNA  versus developing pulmonary edema. - continue antibiotic avoid nephrotoxic medications   -  Echo 1/18= LVEF 55-60%, LVH -CXR with pleural effusions    LOS: [place] 1/21/20214:50 PM  [place], [place]  Note: This note was prepared with Dragon dictation. Any transcription errors are unintentional

## 2019-03-01 LAB — CULTURE, BLOOD (ROUTINE X 2)
Culture: NO GROWTH
Culture: NO GROWTH

## 2019-03-02 LAB — TACROLIMUS LEVEL
Tacrolimus (FK506) - LabCorp: 4.5 ng/mL (ref 2.0–20.0)
Tacrolimus (FK506) - LabCorp: 3.8 ng/mL (ref 2.0–20.0)

## 2019-08-13 ENCOUNTER — Ambulatory Visit: Payer: Medicare Other | Attending: Internal Medicine

## 2019-08-13 ENCOUNTER — Other Ambulatory Visit: Payer: Self-pay

## 2019-08-13 DIAGNOSIS — R2689 Other abnormalities of gait and mobility: Secondary | ICD-10-CM | POA: Insufficient documentation

## 2019-08-13 DIAGNOSIS — M6281 Muscle weakness (generalized): Secondary | ICD-10-CM | POA: Diagnosis not present

## 2019-08-13 DIAGNOSIS — R2681 Unsteadiness on feet: Secondary | ICD-10-CM | POA: Diagnosis present

## 2019-08-13 NOTE — Patient Instructions (Signed)
Access Code: [email or web address] URL: [email or web address] Date: 08/13/2019 Prepared by: Janna Arch  Exercises Seated Hip Abduction with Resistance - 1 x daily - 7 x weekly - 2 sets - 10 reps - 5 hold Seated Long Arc Quad - 1 x daily - 7 x weekly - 2 sets - 10 reps - 5 hold Seated Hip Adduction Isometrics with Ball - 1 x daily - 7 x weekly - 2 sets - 10 reps - 5 hold Seated Heel Toe Raises - 1 x daily - 7 x weekly - 2 sets - 10 reps - 5 hold

## 2019-08-13 NOTE — Therapy (Signed)
[place] MAIN [facility] SERVICES [address] [place], [place], [address] Phone: [phone]   Fax:  [phone]  Physical Therapy Evaluation  Patient Details  Name: Eric Gill MRN: [id number] Date of Birth: November 10, 1950 Referring Provider (PT): Jerrol Banana.    Encounter Date: 08/13/2019   PT End of Session - 08/14/19 1013    Visit Number 1    Number of Visits 16    Date for PT Re-Evaluation 10/08/19    Authorization Type 1/10 eval 7/7    Authorization Time Period PT FOTO on eval    PT Start Time 1109    PT Stop Time 1208    PT Time Calculation (min) 59 min    Equipment Utilized During Treatment Gait belt    Activity Tolerance Patient limited by fatigue    Behavior During Therapy WFL for tasks assessed/performed           Past Medical History:  Diagnosis Date  . Anemia   . Antiphospholipid antibody syndrome ([place])   . Arthritis   . DVT (deep venous thrombosis) ([place])   . Essential hypertension   . Gastric ulcer   . GERD (gastroesophageal reflux disease)   . History of CMV   . Hyperlipidemia   . Hyperparathyroidism ([place])   . Pedal edema   . Peripheral vascular disease ([place])   . Rosacea   . Status post kidney transplant 2001  . Stroke ([place])   . TIA (transient ischemic attack)   . Vascular dementia without behavioral disturbance [facility])     Past Surgical History:  Procedure Laterality Date  . CHOLECYSTECTOMY    . COLONOSCOPY WITH PROPOFOL N/A 03/06/2015   Procedure: COLONOSCOPY WITH PROPOFOL;  Surgeon: Josefine Class, MD;  Location: [facility] ENDOSCOPY;  Service: Endoscopy;  Laterality: N/A;  . ELBOW SURGERY    . ESOPHAGOGASTRODUODENOSCOPY Left 03/01/2015   Procedure: ESOPHAGOGASTRODUODENOSCOPY (EGD);  Surgeon: Hulen Luster, MD;  Location: [facility] ENDOSCOPY;  Service: Endoscopy;  Laterality: Left;  . ESOPHAGOGASTRODUODENOSCOPY (EGD) WITH PROPOFOL  03/06/2015   Procedure: ESOPHAGOGASTRODUODENOSCOPY (EGD) WITH PROPOFOL;   Surgeon: Josefine Class, MD;  Location: [facility] ENDOSCOPY;  Service: Endoscopy;;  . ESOPHAGOGASTRODUODENOSCOPY (EGD) WITH PROPOFOL N/A 05/25/2015   Procedure: ESOPHAGOGASTRODUODENOSCOPY (EGD) WITH PROPOFOL;  Surgeon: Josefine Class, MD;  Location: [facility] ENDOSCOPY;  Service: Endoscopy;  Laterality: N/A;  . KIDNEY TRANSPLANT    . KNEE ARTHROSCOPY      There were no vitals filed for this visit.    Subjective Assessment - 08/13/19 1115    Subjective [name] a pleasant 69 year old male who presents for weakness of bilateral legs and ataxia.    [name] accompained by: Family member    Pertinent History [name] a pleasant 69 year old male who presents for weakness of bilateral legs and ataxia. Sudden onset of pain in ankle and went to [place] and when they came back he couldn't walk. Leg swelled and was bruised. Went to orthopedic urgent care, couldn't find anything wrong. Was sent to ultrasound to determine if it was a bloodclot. After a few days similar episode but with the other leg. Kidney patient at [place], went to ER had vascular and cardiac team assess. Swelling and bruising went away. Was determined not to be a blood clot.Never fully recovered from that. After that he had pneumonia. Did have some home health therapy, ended in January. Since then has been going "downhill" has not been eating, taking his medications, early dementia  signs. Uses a walker for short distances in the home. Multiple near falls a month. Wife is caregiver PMH includes AAA, lymphedema BLE, DVT of LLE, pyelonephritis of transplanted kidney, HTN, iron deficiency anemia, vascular dementia, hyperlipidemia, TIA, CMV, depression, GERD, tremor, COPD    Limitations Sitting;Lifting;Standing;Walking;House hold activities    How long can you sit comfortably? half hour with back support    How long can you stand comfortably? limited immediately    How long can you walk comfortably? limited as soon as he stands     Diagnostic tests ultrasound cleared for new DVTs    Patient Stated Goals improve strength and balance    Currently in Pain? No/denies              [facility] Behavioral PT Assessment - 08/14/19 0001      Assessment   Medical Diagnosis BLE weakness     Referring Provider (PT) Jerrol Banana.     Onset Date/Surgical Date --   January 2021   Hand Dominance Right    Prior Therapy HHPT       Precautions   Precautions Other (comment);Fall   kidney transplant     Restrictions   Weight Bearing Restrictions No      Balance Screen   Has the patient fallen in the past 6 months Yes    How many times? 8ish     Has the patient had a decrease in activity level because of a fear of falling?  Yes    Is the patient reluctant to leave their home because of a fear of falling?  Yes      [place] Private residence    Living Arrangements Spouse/significant other    Available Help at Discharge Family    Type of [place] Two level;Able to live on main level with bedroom/bathroom    [place] - 2 wheels;Grab bars - tub/shower;Grab bars - toilet;Shower seat;Wheelchair - manual      Prior Function   Level of Independence Needs assistance with ADLs   needs help showering   Leisure likes to be on the cumputer, watch fishing       Cognition   Overall Cognitive Status History of cognitive impairments - at baseline   recent onset of of dementia      Observation/Other Assessments   Observations frail, limited muscle mass     Focus on Therapeutic Outcomes (FOTO)  40%      Standardized Balance Assessment   Standardized Balance Assessment Berg Balance Test      Berg Balance Test   Sit to Stand Able to stand  independently using hands    Standing Unsupported Able to stand safely 2 minutes    Sitting with Back Unsupported but Feet Supported on Floor or Stool Able to sit safely and securely 2 minutes    Stand to Sit Controls descent by using hands     Transfers Able to transfer safely, definite need of hands    Standing Unsupported with Eyes Closed Able to stand 10 seconds with supervision    Standing Unsupported with Feet Together Able to place feet together independently but unable to hold for 30 seconds    From Standing, Reach Forward with Outstretched Arm Reaches forward but needs supervision    From Standing Position, Pick up Object from Floor Unable to pick up and needs supervision    From Standing Position, Turn to Look Behind Over each  Shoulder Needs supervision when turning    Turn 360 Degrees Needs close supervision or verbal cueing    Standing Unsupported, Alternately Place Feet on Step/Stool Able to complete >2 steps/needs minimal assist    Standing Unsupported, One Foot in Front Needs help to step but can hold 15 seconds    Standing on One Leg Tries to lift leg/unable to hold 3 seconds but remains standing independently    Total Score 29               PAIN: R shoulder issues; nonpainful just limited in motion.   POSTURE:  seated: Legs windswept to the R, L trunk lean, forward head  Standing: Noticeable curvature of thoracic spine with bony prominences very eminent from poor nutrition and limited muscle mass    PROM/AROM:  AROM BUE L WFL (slight limitations >100 degrees) R limited to 90 degrees   AROM BLE: Limited hip extension bilaterally; limited hip abduction LLE by 4 degrees.   STRENGTH:  Graded on a 0-5 scale Muscle Group Left Right  Hip Flex 3/5 3+/5  Hip Abd 2+/5 3+/5  Hip Add 2+/5 3+/5  Hip Ext 2+/5 2+/5  Hip IR/ER 2+/5 3+/5  Knee Flex 3-/5 3+/5  Knee Ext 3/5 3+/5  Ankle DF 3/5 3/5  Ankle PF 3/5 3/5   SENSATION:  Limited due to patient cognition                          COORDINATION: Finger to Nose: Dysmetric Slow speed and moderate pass pointing; limited with RUE due to shoulder dysfunction   Heel shin test: dymsetric and limited bilaterally.   SPECIAL TESTS:  limited ability to gaze  follow pen across to the L.  Limited lateral/peripheral gaze fields bilaterally  FUNCTIONAL MOBILITY: KGM:WNUUVO base of support, able to perform one time without UE support from raised surface, requires UE support of LUE from standard height surface  BALANCE: Dynamic Sitting Balance  Normal Able to sit unsupported and weight shift across midline maximally   Good Able to sit unsupported and weight shift across midline moderately   Good-/Fair+ Able to sit unsupported and weight shift across midline minimally   Fair Minimal weight shiting ipsilateral/front, difficulty crossing midline   Fair- Reach to ipsilateral side and unable to weight shift x  Poor + Able to sit unsupported with min A and reach to ipsilateral side, unable to weight shift   Poor Able to sit unsupported with mod A and reach ipsilateral/front-can't cross midline     Standing Dynamic Balance  Normal Stand independently unsupported, able to weight shift and cross midline maximally   Good Stand independently unsupported, able to weight shift and cross midline moderately   Good-/Fair+ Stand independently unsupported, able to weight shift across midline minimally   Fair Stand independently unsupported, weight shift, and reach ipsilaterally, loss of balance when crossing midline   Poor+ Able to stand with Min A and reach ipsilaterally, unable to weight shift x  Poor Able to stand with Mod A and minimally reach ipsilaterally, unable to cross midline.     Static Sitting Balance  Normal Able to maintain balance against maximal resistance   Good Able to maintain balance against moderate resistance   Good-/Fair+ Accepts minimal resistance   Fair Able to sit unsupported without balance loss and without UE support x  Poor+ Able to maintain with Minimal assistance from individual or chair   Poor Unable to maintain balance-requires mod/max support  from individual or chair     Static Standing Balance  Normal Able to maintain  standing balance against maximal resistance   Good Able to maintain standing balance against moderate resistance   Good-/Fair+ Able to maintain standing balance against minimal resistance   Fair Able to stand unsupported without UE support and without LOB for 1-2 min   Fair- Requires Min A and UE support to maintain standing without loss of balance x  Poor+ Requires mod A and UE support to maintain standing without loss of balance   Poor Requires max A and UE support to maintain standing balance without loss       GAIT: Patient ambulates with rollator with bilateral foot shuffle, poor foot clearance, heavy forward trunk lean and limited step length.   OUTCOME MEASURES: TEST Outcome Interpretation  5 times sit<>stand 28.9 sec with UE support >74 yo, >15 sec indicates increased risk for falls  10 meter walk test      23.15 w RW            m/s <1.0 m/s indicates increased risk for falls; limited community ambulator  FOTO 40% Projected discharge score of 54%       Berg Balance Assessment 29/56 <36/56 (100% risk for falls), 37-45 (80% risk for falls); 46-51 (>50% risk for falls); 52-55 (lower risk <25% of falls)           Access Code: [email or web address] URL: [email or web address] Date: 08/13/2019 Prepared by: Janna Arch  Exercises Seated Hip Abduction with Resistance - 1 x daily - 7 x weekly - 2 sets - 10 reps - 5 hold Seated Long Arc Quad - 1 x daily - 7 x weekly - 2 sets - 10 reps - 5 hold Seated Hip Adduction Isometrics with Ball - 1 x daily - 7 x weekly - 2 sets - 10 reps - 5 hold Seated Heel Toe Raises - 1 x daily - 7 x weekly - 2 sets - 10 reps - 5 hold      Objective measurements completed on examination: See above findings.               PT Education - 08/14/19 1012    Education Details goals, POC, HEP    Person(s) Educated Patient;Spouse    Methods Demonstration;Explanation;Tactile cues;Verbal cues;Handout    Comprehension Verbalized  understanding;Returned demonstration;Verbal cues required;Tactile cues required            PT Short Term Goals - 08/14/19 1017      PT SHORT TERM GOAL #1   Title Patient will be independent in home exercise program to improve strength/mobility for better functional independence with ADLs.    Baseline 7/6 HEP given    Time 4    Period Weeks    Status New    Target Date 09/11/19             PT Long Term Goals - 08/14/19 1018      PT LONG TERM GOAL #1   Title Patient will increase FOTO score to equal to or greater than  54%   to demonstrate statistically significant improvement in mobility and quality of life.    Baseline 7/6: 40%    Time 8    Period Weeks    Status New    Target Date 10/08/19      PT LONG TERM GOAL #2   Title Patient (> 29 years old) will complete five times sit to stand test in < 15 seconds without UE  support indicating an increased LE strength and improved balance.    Baseline 7/6; 28.9 seconds with UE support    Time 8    Period Weeks    Status New    Target Date 10/08/19      PT LONG TERM GOAL #3   Title Patient will increase 10 meter walk test to >1.9m/s as to improve gait speed for better community ambulation and to reduce fall risk.    Baseline 7/6: 0.43 m/s with rollator    Time 8    Period Weeks    Status New    Target Date 10/08/19      PT LONG TERM GOAL #4   Title Patient will demonstrate an improved Berg Balance Score of > 35/56 as to demonstrate improved balance with ADLs such as sitting/standing and transfer balance and reduced fall risk.    Baseline 7/6: 29/56    Time 8    Period Weeks    Status New    Target Date 10/08/19      PT LONG TERM GOAL #5   Title Patient will increase BLE gross strength to 4+/5 as to improve functional strength for independent gait, increased standing tolerance and increased ADL ability.    Baseline 7/6: see note    Time 8    Period Weeks    Status New    Target Date 10/08/19                   Plan - 08/14/19 1014    Clinical Impression Statement [name] a very pleasant 69 year old male whose history was obtained through both patient and patient's wife due to patient's recent onset of dementia. [name] significantly weak with limited muscle mass and frail appearance. Postural limitations and limited gaze is noted with patient's wife aware of limited ability to obtain midline. [name] additionally challenged with stability without UE support as well as ambulation with poor foot clearance. He would benefit from skilled physical therapy to increase strength, stability, and capacity for functional mobility to decrease falls risk and improve quality of life.    Personal Factors and Comorbidities Age;Comorbidity 3+;Behavior Pattern;Finances;Fitness;Past/Current Experience;Social Background;Time since onset of injury/illness/exacerbation;Transportation    Comorbidities AAA, lymphedema BLE, DVT of LLE, pyelonephritis of transplanted kidney, HTN, iron deficiency anemia, vascular dementia, hyperlipidemia, TIA, CMV, depression, GERD, tremor, COPD    Examination-Activity Limitations Bathing;Bed Mobility;Bend;Carry;Dressing;Stairs;Hygiene/Grooming;Squat;Reach Overhead;Sit;Locomotion Level;Lift;Stand;Toileting;Transfers    Examination-Participation Restrictions Church;Cleaning;Community Activity;Driving;Laundry;Volunteer;Shop;Meal Prep;Yard Work    [employer] Evolving/Moderate complexity    Clinical Decision Making Moderate    Rehab Potential Fair    PT Frequency 2x / week    PT Duration 8 weeks    PT Treatment/Interventions ADLs/Self Care Home Management;Aquatic Therapy;Biofeedback;Canalith Repostioning;Cryotherapy;Electrical Stimulation;Iontophoresis 4mg /ml Dexamethasone;Moist Heat;Traction;Ultrasound;Therapeutic exercise;Therapeutic activities;Functional mobility training;Stair training;Gait training;DME Instruction;Balance training;Neuromuscular  re-education;Cognitive remediation;Patient/family education;Orthotic Fit/Training;Manual techniques;Passive range of motion;Dry needling;Energy conservation;Taping;Vestibular;Visual/perceptual remediation/compensation    PT Next Visit Plan // bars strengthening and balance    PT Home Exercise Plan see above    Consulted and Agree with Plan of Care Patient;Family member/caregiver    Family Member Consulted wife           Patient will benefit from skilled therapeutic intervention in order to improve the following deficits and impairments:  Abnormal gait, Cardiopulmonary status limiting activity, Decreased activity tolerance, Decreased balance, Decreased knowledge of precautions, Decreased endurance, Decreased coordination, Decreased cognition, Decreased mobility, Decreased range of motion, Decreased safety awareness, Difficulty walking, Decreased strength, Impaired flexibility, Impaired perceived functional  ability, Impaired UE functional use, Impaired vision/preception, Postural dysfunction, Improper body mechanics  Visit Diagnosis: Muscle weakness (generalized)  Unsteadiness on feet  Other abnormalities of gait and mobility     Problem List Patient Active Problem List   Diagnosis Date Noted  . CAP (community acquired pneumonia) 02/24/2019  . Renal failure (ARF), acute on chronic (HCC) 10/16/2018  . Rectal bleeding   . GI bleed 02/23/2015  . Acute blood loss anemia 02/23/2015   Janna Arch, PT, DPT   08/14/2019, 10:30 AM  [place] MAIN [facility] SERVICES [address] [place], [place], [address] Phone: [phone]   Fax:  [phone]  Name: EMMIT ORILEY MRN: [id number] Date of Birth: September 10, 1950

## 2019-08-19 ENCOUNTER — Ambulatory Visit: Payer: Medicare Other

## 2019-08-19 ENCOUNTER — Other Ambulatory Visit: Payer: Self-pay

## 2019-08-19 DIAGNOSIS — R2689 Other abnormalities of gait and mobility: Secondary | ICD-10-CM

## 2019-08-19 DIAGNOSIS — R2681 Unsteadiness on feet: Secondary | ICD-10-CM

## 2019-08-19 DIAGNOSIS — M6281 Muscle weakness (generalized): Secondary | ICD-10-CM | POA: Diagnosis not present

## 2019-08-19 NOTE — Therapy (Signed)
[place] MAIN [facility] SERVICES [address] [place], [place], [address] Phone: [phone]   Fax:  [phone]  Physical Therapy Treatment  Patient Details  Name: Eric Gill MRN: [id number] Date of Birth: [birth year] Referring Provider (PT): Jerrol Banana.    Encounter Date: 08/19/2019   PT End of Session - 08/19/19 1625    Visit Number 2    Number of Visits 16    Date for PT Re-Evaluation 10/08/19    Authorization Type 2/10 eval 7/7    Authorization Time Period PT FOTO on eval    PT Start Time 1523    PT Stop Time 1602    PT Time Calculation (min) 39 min    Equipment Utilized During Treatment Gait belt    Activity Tolerance Patient limited by fatigue    Behavior During Therapy WFL for tasks assessed/performed           Past Medical History:  Diagnosis Date  . Anemia   . Antiphospholipid antibody syndrome ([place])   . Arthritis   . DVT (deep venous thrombosis) ([place])   . Essential hypertension   . Gastric ulcer   . GERD (gastroesophageal reflux disease)   . History of CMV   . Hyperlipidemia   . Hyperparathyroidism ([place])   . Pedal edema   . Peripheral vascular disease ([place])   . Rosacea   . Status post kidney transplant 2001  . Stroke ([place])   . TIA (transient ischemic attack)   . Vascular dementia without behavioral disturbance [facility])     Past Surgical History:  Procedure Laterality Date  . CHOLECYSTECTOMY    . COLONOSCOPY WITH PROPOFOL N/A 03/06/2015   Procedure: COLONOSCOPY WITH PROPOFOL;  Surgeon: Josefine Class, MD;  Location: [facility] ENDOSCOPY;  Service: Endoscopy;  Laterality: N/A;  . ELBOW SURGERY    . ESOPHAGOGASTRODUODENOSCOPY Left 03/01/2015   Procedure: ESOPHAGOGASTRODUODENOSCOPY (EGD);  Surgeon: Hulen Luster, MD;  Location: [facility] ENDOSCOPY;  Service: Endoscopy;  Laterality: Left;  . ESOPHAGOGASTRODUODENOSCOPY (EGD) WITH PROPOFOL  03/06/2015   Procedure: ESOPHAGOGASTRODUODENOSCOPY (EGD) WITH PROPOFOL;   Surgeon: Josefine Class, MD;  Location: [facility] ENDOSCOPY;  Service: Endoscopy;;  . ESOPHAGOGASTRODUODENOSCOPY (EGD) WITH PROPOFOL N/A 05/25/2015   Procedure: ESOPHAGOGASTRODUODENOSCOPY (EGD) WITH PROPOFOL;  Surgeon: Josefine Class, MD;  Location: [facility] ENDOSCOPY;  Service: Endoscopy;  Laterality: N/A;  . KIDNEY TRANSPLANT    . KNEE ARTHROSCOPY      There were no vitals filed for this visit.   Subjective Assessment - 08/19/19 1624    Subjective Patient presents with famliy friend. Reports no falls since evaluation but is fatigued due to shower prior to session.    [name] accompained by: Family member    Pertinent History [name] a pleasant 69 year old male who presents for weakness of bilateral legs and ataxia. Sudden onset of pain in ankle and went to [place] and when they came back he couldn't walk. Leg swelled and was bruised. Went to orthopedic urgent care, couldn't find anything wrong. Was sent to ultrasound to determine if it was a bloodclot. After a few days similar episode but with the other leg. Kidney patient at [place], went to ER had vascular and cardiac team assess. Swelling and bruising went away. Was determined not to be a blood clot.Never fully recovered from that. After that he had pneumonia. Did have some home health therapy, ended in January. Since then has been going "downhill" has not been eating, taking his medications, early  dementia signs. Uses a walker for short distances in the home. Multiple near falls a month. Wife is caregiver PMH includes AAA, lymphedema BLE, DVT of LLE, pyelonephritis of transplanted kidney, HTN, iron deficiency anemia, vascular dementia, hyperlipidemia, TIA, CMV, depression, GERD, tremor, COPD    Limitations Sitting;Lifting;Standing;Walking;House hold activities    How long can you sit comfortably? half hour with back support    How long can you stand comfortably? limited immediately    How long can you walk comfortably? limited as soon as  he stands    Diagnostic tests ultrasound cleared for new DVTs    Patient Stated Goals improve strength and balance    Currently in Pain? No/denies              Treatment:  In // bars with CGA and cues for body mechanics, upright posture and sequencing.  -ambulate length of // bars 4x with seated rest breaks, cues for upright posture and bigger stride length -ambulate backwards in // bars 1x length of // bars -static stand without UE support, cues for upright posture 30 seconds -static stand with max cueing for horizontal head turns 30 seconds  Ambulate with rollator with cues for keeping feet closer to assistive device for safety and upright posture, cues for keeping AD in front of patient as turn to sit on plinth table.   Seated on plinth table: cue for "lean" when leaning to the left or backwards to straighten self up, agreeable to use at home as key word as well -balloon taps for reaching outside BOS, reaction timing, and spatial awareness x 3 minutes -RTB adduction 15x each LE with visual cue to PT hand -RTB hamstring curl 15x each LE, with visual cue to PT hand for optimal arc of motion -upright posture reaching for balls on L and R sit up tall and throw at target x 12   Pt educated throughout session about proper posture and technique with exercises. Improved exercise technique, movement at target joints, use of target muscles after min to mod verbal, visual, tactile cues.   .   Patient fatigues very quickly due to limited muscle tissue mass however is motivated to perform. Left and posterior trunk lean are involuntary with patient often not aware that it is occurring in seated position. In standing patient forward flexes placing heavy reliance upon UE support due to fatigue in LE's as standing progresses. He will continue to benefit from skilled physical therapy to increase strength, stability, and capacity for functional mobility to decrease falls risk and improve quality of  life.                      PT Education - 08/19/19 1625    Education Details exercise technique, body mechanics    Person(s) Educated Patient    Methods Explanation;Demonstration;Tactile cues;Verbal cues    Comprehension Verbalized understanding;Returned demonstration;Verbal cues required;Tactile cues required            PT Short Term Goals - 08/14/19 1017      PT SHORT TERM GOAL #1   Title Patient will be independent in home exercise program to improve strength/mobility for better functional independence with ADLs.    Baseline 7/6 HEP given    Time 4    Period Weeks    Status New    Target Date 09/11/19             PT Long Term Goals - 08/14/19 1018      PT  LONG TERM GOAL #1   Title Patient will increase FOTO score to equal to or greater than  54%   to demonstrate statistically significant improvement in mobility and quality of life.    Baseline 7/6: 40%    Time 8    Period Weeks    Status New    Target Date 10/08/19      PT LONG TERM GOAL #2   Title Patient (> 17 years old) will complete five times sit to stand test in < 15 seconds without UE support indicating an increased LE strength and improved balance.    Baseline 7/6; 28.9 seconds with UE support    Time 8    Period Weeks    Status New    Target Date 10/08/19      PT LONG TERM GOAL #3   Title Patient will increase 10 meter walk test to >1.84m/s as to improve gait speed for better community ambulation and to reduce fall risk.    Baseline 7/6: 0.43 m/s with rollator    Time 8    Period Weeks    Status New    Target Date 10/08/19      PT LONG TERM GOAL #4   Title Patient will demonstrate an improved Berg Balance Score of > 35/56 as to demonstrate improved balance with ADLs such as sitting/standing and transfer balance and reduced fall risk.    Baseline 7/6: 29/56    Time 8    Period Weeks    Status New    Target Date 10/08/19      PT LONG TERM GOAL #5   Title Patient will increase  BLE gross strength to 4+/5 as to improve functional strength for independent gait, increased standing tolerance and increased ADL ability.    Baseline 7/6: see note    Time 8    Period Weeks    Status New    Target Date 10/08/19                 Plan - 08/19/19 1626    Clinical Impression Statement Patient fatigues very quickly due to limited muscle tissue mass however is motivated to perform. Left and posterior trunk lean are involuntary with patient often not aware that it is occurring in seated position. In standing patient forward flexes placing heavy reliance upon UE support due to fatigue in LE's as standing progresses. He will continue to benefit from skilled physical therapy to increase strength, stability, and capacity for functional mobility to decrease falls risk and improve quality of life.    Personal Factors and Comorbidities Age;Comorbidity 3+;Behavior Pattern;Finances;Fitness;Past/Current Experience;Social Background;Time since onset of injury/illness/exacerbation;Transportation    Comorbidities AAA, lymphedema BLE, DVT of LLE, pyelonephritis of transplanted kidney, HTN, iron deficiency anemia, vascular dementia, hyperlipidemia, TIA, CMV, depression, GERD, tremor, COPD    Examination-Activity Limitations Bathing;Bed Mobility;Bend;Carry;Dressing;Stairs;Hygiene/Grooming;Squat;Reach Overhead;Sit;Locomotion Level;Lift;Stand;Toileting;Transfers    Examination-Participation Restrictions Church;Cleaning;Community Activity;Driving;Laundry;Volunteer;Shop;Meal Prep;Yard Work    [employer] Evolving/Moderate complexity    Rehab Potential Fair    PT Frequency 2x / week    PT Duration 8 weeks    PT Treatment/Interventions ADLs/Self Care Home Management;Aquatic Therapy;Biofeedback;Canalith Repostioning;Cryotherapy;Electrical Stimulation;Iontophoresis 4mg /ml Dexamethasone;Moist Heat;Traction;Ultrasound;Therapeutic exercise;Therapeutic activities;Functional mobility  training;Stair training;Gait training;DME Instruction;Balance training;Neuromuscular re-education;Cognitive remediation;Patient/family education;Orthotic Fit/Training;Manual techniques;Passive range of motion;Dry needling;Energy conservation;Taping;Vestibular;Visual/perceptual remediation/compensation    PT Next Visit Plan // bars strengthening and balance    PT Home Exercise Plan see above    Consulted and Agree with Plan of Care Patient;Family member/caregiver    Family Member  Consulted wife           Patient will benefit from skilled therapeutic intervention in order to improve the following deficits and impairments:  Abnormal gait, Cardiopulmonary status limiting activity, Decreased activity tolerance, Decreased balance, Decreased knowledge of precautions, Decreased endurance, Decreased coordination, Decreased cognition, Decreased mobility, Decreased range of motion, Decreased safety awareness, Difficulty walking, Decreased strength, Impaired flexibility, Impaired perceived functional ability, Impaired UE functional use, Impaired vision/preception, Postural dysfunction, Improper body mechanics  Visit Diagnosis: Muscle weakness (generalized)  Unsteadiness on feet  Other abnormalities of gait and mobility     Problem List Patient Active Problem List   Diagnosis Date Noted  . CAP (community acquired pneumonia) 02/24/2019  . Renal failure (ARF), acute on chronic (HCC) 10/16/2018  . Rectal bleeding   . GI bleed 02/23/2015  . Acute blood loss anemia 02/23/2015   Janna Arch, PT, DPT   08/19/2019, 4:28 PM  [place] MAIN [facility] SERVICES [address] [place], [place], [address] Phone: [phone]   Fax:  [phone]  Name: Eric Gill MRN: [id number] Date of Birth: 1951-01-27

## 2019-08-20 ENCOUNTER — Emergency Department: Payer: Medicare Other

## 2019-08-20 ENCOUNTER — Other Ambulatory Visit: Payer: Self-pay

## 2019-08-20 ENCOUNTER — Emergency Department
Admission: EM | Admit: 2019-08-20 | Discharge: 2019-08-20 | Disposition: A | Discharge status: Home / Self Care | Payer: Medicare Other | Attending: Student in an Organized Health Care Education/Training Program | Admitting: Student in an Organized Health Care Education/Training Program

## 2019-08-20 ENCOUNTER — Encounter: Payer: Self-pay | Admitting: Emergency Medicine

## 2019-08-20 DIAGNOSIS — S40212A Abrasion of left shoulder, initial encounter: Secondary | ICD-10-CM | POA: Diagnosis not present

## 2019-08-20 DIAGNOSIS — W182XXA Fall in (into) shower or empty bathtub, initial encounter: Secondary | ICD-10-CM | POA: Diagnosis not present

## 2019-08-20 DIAGNOSIS — Z940 Kidney transplant status: Secondary | ICD-10-CM | POA: Insufficient documentation

## 2019-08-20 DIAGNOSIS — Y939 Activity, unspecified: Secondary | ICD-10-CM | POA: Insufficient documentation

## 2019-08-20 DIAGNOSIS — Z87891 Personal history of nicotine dependence: Secondary | ICD-10-CM | POA: Insufficient documentation

## 2019-08-20 DIAGNOSIS — S065X0A Traumatic subdural hemorrhage without loss of consciousness, initial encounter: Secondary | ICD-10-CM | POA: Insufficient documentation

## 2019-08-20 DIAGNOSIS — S80212A Abrasion, left knee, initial encounter: Secondary | ICD-10-CM | POA: Insufficient documentation

## 2019-08-20 DIAGNOSIS — S065XAA Traumatic subdural hemorrhage with loss of consciousness status unknown, initial encounter: Secondary | ICD-10-CM

## 2019-08-20 DIAGNOSIS — I10 Essential (primary) hypertension: Secondary | ICD-10-CM | POA: Diagnosis not present

## 2019-08-20 DIAGNOSIS — Y92002 Bathroom of unspecified non-institutional (private) residence single-family (private) house as the place of occurrence of the external cause: Secondary | ICD-10-CM | POA: Insufficient documentation

## 2019-08-20 DIAGNOSIS — Y999 Unspecified external cause status: Secondary | ICD-10-CM | POA: Insufficient documentation

## 2019-08-20 DIAGNOSIS — S0081XA Abrasion of other part of head, initial encounter: Secondary | ICD-10-CM | POA: Insufficient documentation

## 2019-08-20 LAB — COMPREHENSIVE METABOLIC PANEL
ALT: 12 U/L (ref 0–44)
AST: 14 U/L — ABNORMAL LOW (ref 15–41)
Albumin: 3.5 g/dL (ref 3.5–5.0)
Alkaline Phosphatase: 59 U/L (ref 38–126)
Anion gap: 11 (ref 5–15)
BUN: 65 mg/dL — ABNORMAL HIGH (ref 8–23)
CO2: 14 mmol/L — ABNORMAL LOW (ref 22–32)
Calcium: 9.6 mg/dL (ref 8.9–10.3)
Chloride: 114 mmol/L — ABNORMAL HIGH (ref 98–111)
Creatinine, Ser: 3.83 mg/dL — ABNORMAL HIGH (ref 0.61–1.24)
GFR calc Af Amer: 17 mL/min — ABNORMAL LOW (ref >60)
GFR calc non Af Amer: 15 mL/min — ABNORMAL LOW (ref >60)
Glucose, Bld: 90 mg/dL (ref 70–99)
Potassium: 3.9 mmol/L (ref 3.5–5.1)
Sodium: 139 mmol/L (ref 135–145)
Total Bilirubin: 0.5 mg/dL (ref 0.3–1.2)
Total Protein: 6.1 g/dL — ABNORMAL LOW (ref 6.5–8.1)

## 2019-08-20 LAB — CBC WITH DIFFERENTIAL/PLATELET
Abs Immature Granulocytes: 0.05 10*3/uL (ref 0.00–0.07)
Basophils Absolute: 0 10*3/uL (ref 0.0–0.1)
Basophils Relative: 0 %
Eosinophils Absolute: 0 10*3/uL (ref 0.0–0.5)
Eosinophils Relative: 0 %
HCT: 25.1 % — ABNORMAL LOW (ref 39.0–52.0)
Hemoglobin: 8 g/dL — ABNORMAL LOW (ref 13.0–17.0)
Immature Granulocytes: 1 %
Lymphocytes Relative: 8 %
Lymphs Abs: 0.8 10*3/uL (ref 0.7–4.0)
MCH: 26.8 pg (ref 26.0–34.0)
MCHC: 31.9 g/dL (ref 30.0–36.0)
MCV: 84.2 fL (ref 80.0–100.0)
Monocytes Absolute: 0.9 10*3/uL (ref 0.1–1.0)
Monocytes Relative: 9 %
Neutro Abs: 8.2 10*3/uL — ABNORMAL HIGH (ref 1.7–7.7)
Neutrophils Relative %: 82 %
Platelets: 161 10*3/uL (ref 150–400)
RBC: 2.98 MIL/uL — ABNORMAL LOW (ref 4.22–5.81)
RDW: 15.7 % — ABNORMAL HIGH (ref 11.5–15.5)
WBC: 10 10*3/uL (ref 4.0–10.5)
nRBC: 0 % (ref 0.0–0.2)

## 2019-08-20 LAB — PROTIME-INR
INR: 1.5 — ABNORMAL HIGH (ref 0.8–1.2)
Prothrombin Time: 17.4 seconds — ABNORMAL HIGH (ref 11.4–15.2)

## 2019-08-20 MED ORDER — AMLODIPINE BESYLATE 5 MG PO TABS
5.0000 mg | ORAL_TABLET | Freq: Once | ORAL | Status: AC
  Administered 2019-08-20: 5 mg via ORAL
  Filled 2019-08-20: qty 1

## 2019-08-20 MED ORDER — LABETALOL HCL 200 MG PO TABS
200.0000 mg | ORAL_TABLET | Freq: Once | ORAL | Status: AC
  Administered 2019-08-20: 200 mg via ORAL
  Filled 2019-08-20: qty 1

## 2019-08-20 MED ORDER — ONDANSETRON HCL 4 MG/2ML IJ SOLN
INTRAMUSCULAR | Status: AC
  Filled 2019-08-20: qty 2

## 2019-08-20 MED ORDER — BACITRACIN ZINC 500 UNIT/GM EX OINT
TOPICAL_OINTMENT | Freq: Once | CUTANEOUS | Status: AC
  Filled 2019-08-20: qty 0.9

## 2019-08-20 MED ORDER — ONDANSETRON HCL 4 MG/2ML IJ SOLN
4.0000 mg | Freq: Once | INTRAMUSCULAR | Status: AC
  Administered 2019-08-20: 4 mg via INTRAVENOUS

## 2019-08-20 MED ORDER — LABETALOL HCL 5 MG/ML IV SOLN
10.0000 mg | Freq: Once | INTRAVENOUS | Status: AC
  Administered 2019-08-20: 10 mg via INTRAVENOUS
  Filled 2019-08-20: qty 4

## 2019-08-20 MED ORDER — HYDRALAZINE HCL 20 MG/ML IJ SOLN
10.0000 mg | Freq: Once | INTRAMUSCULAR | Status: AC
  Administered 2019-08-20: 10 mg via INTRAVENOUS
  Filled 2019-08-20: qty 1

## 2019-08-20 NOTE — ED Notes (Signed)
Resumed care from [place] rn.  Family with pt.  nsr on monitor.  Pt awake and alert.

## 2019-08-20 NOTE — ED Triage Notes (Addendum)
Pt arrived to ED from home via EMS after falling. Pt states he thinks he just lost his balance. Hematoma noted to left side of head with small aceration and abrasion to left shoulder. Denies loc. Bleeding controlled. Pt alert and calm at this time. Answering questions appropriately. VS per EMS BP 209/97 HR 97 O2 97% temp 98.2.

## 2019-08-20 NOTE — Discharge Instructions (Addendum)
Hold coumadin for 2 weeks.  Follow up with neurosurgery and primary care.  Return for any additional questions or concerns.

## 2019-08-20 NOTE — ED Provider Notes (Signed)
[facility] Emergency Department Provider Note    First MD Initiated Contact with Patient 08/20/19 [phone]     (approximate)  I have reviewed the triage vital signs and the nursing notes.   HISTORY  Chief Complaint Fall  Level V Caveat:  Dementia  HPI Eric Gill is a 69 y.o. male with the below listed past medical history presents to the ER for evaluation of fall with injury to his head left shoulder and right leg pain.  States he just lost his balance while he was in the bathroom.  Denies any chest pain or shortness of breath.  Unable to provide much additional history due to history of dementia.    Past Medical History:  Diagnosis Date  . Anemia   . Antiphospholipid antibody syndrome ([place])   . Arthritis   . DVT (deep venous thrombosis) ([place])   . Essential hypertension   . Gastric ulcer   . GERD (gastroesophageal reflux disease)   . History of CMV   . Hyperlipidemia   . Hyperparathyroidism ([place])   . Pedal edema   . Peripheral vascular disease ([place])   . Rosacea   . Status post kidney transplant 2001  . Stroke ([place])   . TIA (transient ischemic attack)   . Vascular dementia without behavioral disturbance ([place])    No family history on file. Past Surgical History:  Procedure Laterality Date  . CHOLECYSTECTOMY    . COLONOSCOPY WITH PROPOFOL N/A 03/06/2015   Procedure: COLONOSCOPY WITH PROPOFOL;  Surgeon: Josefine Class, MD;  Location[facility] ENDOSCOPY;  Service: Endoscopy;  Laterality: N/A;  . ELBOW SURGERY    . ESOPHAGOGASTRODUODENOSCOPY Left 03/01/2015   Procedure: ESOPHAGOGASTRODUODENOSCOPY (EGD);  Surgeon: Hulen Luster, MD;  Location: [facility] ENDOSCOPY;  Service: Endoscopy;  Laterality: Left;  . ESOPHAGOGASTRODUODENOSCOPY (EGD) WITH PROPOFOL  03/06/2015   Procedure: ESOPHAGOGASTRODUODENOSCOPY (EGD) WITH PROPOFOL;  Surgeon: Josefine Class, MD;  Location: [facility] ENDOSCOPY;  Service: Endoscopy;;  . ESOPHAGOGASTRODUODENOSCOPY (EGD) WITH PROPOFOL  N/A 05/25/2015   Procedure: ESOPHAGOGASTRODUODENOSCOPY (EGD) WITH PROPOFOL;  Surgeon: Josefine Class, MD;  Location: [facility] ENDOSCOPY;  Service: Endoscopy;  Laterality: N/A;  . KIDNEY TRANSPLANT    . KNEE ARTHROSCOPY     Patient Active Problem List   Diagnosis Date Noted  . CAP (community acquired pneumonia) 02/24/2019  . Renal failure (ARF), acute on chronic (HCC) 10/16/2018  . Rectal bleeding   . GI bleed 02/23/2015  . Acute blood loss anemia 02/23/2015      Prior to Admission medications   Medication Sig Start Date End Date Taking? Authorizing Provider  acetaminophen (TYLENOL) 325 MG tablet Take 500 mg by mouth every 6 (six) hours as needed.   Yes [provider]  budesonide-formoterol (SYMBICORT) 80-4.5 MCG/ACT inhaler Inhale 2 puffs into the lungs 2 (two) times daily.   Yes [provider]  buPROPion (WELLBUTRIN XL) 300 MG 24 hr tablet Take 300 mg by mouth daily. 08/17/18  Yes [provider]  calcitRIOL (ROCALTROL) 0.25 MCG capsule Take 0.25 mcg by mouth at bedtime.   Yes [provider]  desvenlafaxine (PRISTIQ) 50 MG 24 hr tablet Take 50 mg by mouth daily. 08/16/19  Yes [provider]  diphenoxylate-atropine (LOMOTIL) 2.5-0.025 MG tablet Take 1 tablet by mouth 4 (four) times daily as needed for diarrhea or loose stools.   Yes [provider]  donepezil (ARICEPT) 10 MG tablet Take 10 mg by mouth at bedtime.   Yes [provider]  labetalol (NORMODYNE)  200 MG tablet Take 200 mg by mouth 2 (two) times daily.    Yes [provider]  magnesium oxide (MAG-OX) 400 MG tablet TAKE ONE TABLET BY MOUTH TWICE DAILY 02/22/17  Yes [provider]  Multiple Vitamin (MULTI-VITAMINS) TABS Take 1 tablet by mouth daily.    Yes [provider]  mycophenolate (CELLCEPT) 250 MG capsule Take 250-500 mg by mouth 2 (two) times daily. 250 mg in the am and 500 mg at bedtime   Yes [provider]  pantoprazole  (PROTONIX) 40 MG tablet Take 1 tablet (40 mg total) by mouth daily. 02/28/19  Yes Enzo Bi, MD  potassium chloride (KLOR-CON) 10 MEQ tablet Hold until follow up with kidney doctor. Patient taking differently: Take 10 mEq by mouth 2 (two) times daily.  02/28/19  Yes Enzo Bi, MD  predniSONE (DELTASONE) 5 MG tablet Take 5 mg by mouth daily. 02/18/19  Yes [provider]  warfarin (JANTOVEN) 1 MG tablet Take 2-3 mg by mouth daily. Take two tablets (2.5mg ) on Sunday, Tuesday,Weds, Thursday and Saturday. Take three tablets (3 mg) on Monday and Friday. 08/28/18  Yes [provider]  ALPRAZolam (XANAX) 0.25 MG tablet Take 0.25 mg by mouth 2 (two) times daily as needed. 02/18/19   [provider]  amLODipine (NORVASC) 10 MG tablet Take 10 mg by mouth daily. Take 1/2 t qam    [provider]  citalopram (CELEXA) 20 MG tablet Take 20 mg by mouth daily. Patient not taking: Reported on 08/20/2019    [provider]  dicyclomine (BENTYL) 10 MG capsule Take 10 mg by mouth 3 (three) times daily before meals.  01/01/18   [provider]  fluorouracil (EFUDEX) 5 % cream  02/23/17   [provider]  furosemide (LASIX) 40 MG tablet Hold until followup with kidney doctor. Patient taking differently: Take 40 mg by mouth daily.  02/28/19   Enzo Bi, MD    Allergies Losartan and Statins    Social History Social History   Tobacco Use  . Smoking status: Former Smoker    Packs/day: 1.00    Years: 30.00    Pack years: 30.00    Types: Cigarettes    Quit date: 1999    Years since quitting: 22.5  . Smokeless tobacco: Never Used  Vaping Use  . Vaping Use: Never used  Substance Use Topics  . Alcohol use: No  . Drug use: No    Review of Systems Patient denies headaches, rhinorrhea, blurry vision, numbness, shortness of breath, chest pain, edema, cough, abdominal pain, nausea, vomiting, diarrhea, dysuria, fevers, rashes or hallucinations unless  otherwise stated above in HPI. ____________________________________________   PHYSICAL EXAM:  VITAL SIGNS: Vitals:   08/20/19 1522 08/20/19 1523  BP:    Pulse: 76 74  Resp: 16 12  Temp:    SpO2: 100% 100%    Constitutional: Alert, calm and cooperative Eyes: Conjunctivae are normal.  Head: hematoma and 1cm abrasion to left forehead Nose: No congestion/rhinnorhea. Mouth/Throat: Mucous membranes are moist.   Neck: No stridor. Painless ROM.  Cardiovascular: Normal rate, regular rhythm. Grossly normal heart sounds.  Good peripheral circulation. Respiratory: Normal respiratory effort.  No retractions. Lungs CTAB. Gastrointestinal: Soft and nontender. No distention. No abdominal bruits. No CVA tenderness. Genitourinary:  Musculoskeletal: 2cm abrasion to left shoulder, no deformity noted, 1cm abrasion to left lateral knee, painless rom,  Pain with movement of right thigh, compartment softNo lower extremity tenderness nor edema.  No joint effusions. Neurologic:  Normal speech and language. No gross focal neurologic deficits are appreciated. No facial droop Skin:  Skin is warm, dry and intact. No rash noted. Psychiatric: Mood and affect are normal. Speech and behavior are normal.  ____________________________________________   LABS (all labs ordered are listed, but only abnormal results are displayed)  Results for orders placed or performed during the hospital encounter of 08/20/19 (from the past 24 hour(s))  Protime-INR     Status: Abnormal   Collection Time: 08/20/19  9:02 AM  Result Value Ref Range   Prothrombin Time 17.4 (H) 11.4 - 15.2 seconds   INR 1.5 (H) 0.8 - 1.2  CBC with Differential/Platelet     Status: Abnormal   Collection Time: 08/20/19  9:02 AM  Result Value Ref Range   WBC 10.0 4.0 - 10.5 K/uL   RBC 2.98 (L) 4.22 - 5.81 MIL/uL   Hemoglobin 8.0 (L) 13.0 - 17.0 g/dL   HCT 25.1 (L) 39 - 52 %   MCV 84.2 80.0 - 100.0 fL   MCH 26.8 26.0 - 34.0 pg   MCHC 31.9 30.0 -  36.0 g/dL   RDW 15.7 (H) 11.5 - 15.5 %   Platelets 161 150 - 400 K/uL   nRBC 0.0 0.0 - 0.2 %   Neutrophils Relative % 82 %   Neutro Abs 8.2 (H) 1.7 - 7.7 K/uL   Lymphocytes Relative 8 %   Lymphs Abs 0.8 0.7 - 4.0 K/uL   Monocytes Relative 9 %   Monocytes Absolute 0.9 0 - 1 K/uL   Eosinophils Relative 0 %   Eosinophils Absolute 0.0 0 - 0 K/uL   Basophils Relative 0 %   Basophils Absolute 0.0 0 - 0 K/uL   Immature Granulocytes 1 %   Abs Immature Granulocytes 0.05 0.00 - 0.07 K/uL  Comprehensive metabolic panel     Status: Abnormal   Collection Time: 08/20/19  9:02 AM  Result Value Ref Range   Sodium 139 135 - 145 mmol/L   Potassium 3.9 3.5 - 5.1 mmol/L   Chloride 114 (H) 98 - 111 mmol/L   CO2 14 (L) 22 - 32 mmol/L   Glucose, Bld 90 70 - 99 mg/dL   BUN 65 (H) 8 - 23 mg/dL   Creatinine, Ser 3.83 (H) 0.61 - 1.24 mg/dL   Calcium 9.6 8.9 - 10.3 mg/dL   Total Protein 6.1 (L) 6.5 - 8.1 g/dL   Albumin 3.5 3.5 - 5.0 g/dL   AST 14 (L) 15 - 41 U/L   ALT 12 0 - 44 U/L   Alkaline Phosphatase 59 38 - 126 U/L   Total Bilirubin 0.5 0.3 - 1.2 mg/dL   GFR calc non Af Amer 15 (L) >60 mL/min   GFR calc Af Amer 17 (L) >60 mL/min   Anion gap 11 5 - 15   ____________________________________________ ____________________________________________  RADIOLOGY  I personally reviewed all radiographic images ordered to evaluate for the above acute complaints and reviewed radiology reports and findings.  These findings were personally discussed with the patient.  Please see medical record for radiology report.  ____________________________________________   PROCEDURES  Procedure(s) performed:  Procedures    Critical Care performed: no ____________________________________________   INITIAL IMPRESSION / ASSESSMENT AND PLAN / ED COURSE  Pertinent labs & imaging results that were available during my care of the patient were reviewed by me and considered in my medical decision making (see chart  for details).   DDX: fracture, contusion, ich, dislocation  Eric Gill  is a 69 y.o. who presents to the ED with fall with head injury shoulder injury as described above.  CT imaging does show evidence of small subdural hematoma and cerebral contusion.  Presentation is complicated as the patient is on Coumadin.  Clinical Course as of Aug 20 1530  Tue Aug 20, 2019  [id number] Discussed case in consultation with neurosurgery, Dr. Cari Caraway.  Will plan repeat CT head in 6 hours to evaluate for appropriate disposition.  Awaiting PT/INR.  Family updated at bedside.   [PR]  1011 Patient reassessed.  Will continue observe.  Has been evaluated by neurosurgery.  Have recommended hold Coumadin x2 weeks.  Will continue observe in the ER for repeat head CT.  Will treat blood pressure.   [PR]  9357 BP remains stable.     [PR]  1521 CT imaging stable.  Per neurosurgery recommendations we will hold Coumadin x2-week.  We will follow-up in clinic with neurosurgery.  No additional questions or concerns from family or patient.   [PR]    Clinical Course User Index [PR] Merlyn Lot, MD    The patient was evaluated in Emergency Department today for the symptoms described in the history of present illness. He/she was evaluated in the context of the global COVID-19 pandemic, which necessitated consideration that the patient might be at risk for infection with the SARS-CoV-2 virus that causes COVID-19. Institutional protocols and algorithms that pertain to the evaluation of patients at risk for COVID-19 are in a state of rapid change based on information released by regulatory bodies including the CDC and federal and state organizations. These policies and algorithms were followed during the patient's care in the ED.  As part of my medical decision making, I reviewed the following data within the [place] notes reviewed and incorporated, Labs reviewed, notes from prior ED visits and [place]  Controlled Substance Database   ____________________________________________   FINAL CLINICAL IMPRESSION(S) / ED DIAGNOSES  Final diagnoses:  Subdural hematoma ([place])      NEW MEDICATIONS STARTED DURING THIS VISIT:  New Prescriptions   No medications on file     Note:  This document was prepared using Dragon voice recognition software and may include unintentional dictation errors.    Merlyn Lot, MD 08/20/19 [phone]

## 2019-08-20 NOTE — Consult Note (Signed)
Referring Physician:  No referring provider defined for this encounter.  Primary Physician:  Idelle Crouch, MD  Chief Complaint:  SDH noted after a fall  History of Present Illness: Eric Gill is a 69 y.o. male with PMH of dementia (per wife, issues with short term memory loss), GI bleed, renal failure, rectal bleeding, who presents to the [facility] ED after suffering a fall in his BR.  He is a poor historian and does not provide much detail about his fall. His wife said she found him in the bathroom around 5 something this morning.   Head CT was completed which showed a 3 mm left temporal subdural hematoma with no midline shift or mass-effect.  Neurosurgery was consulted given this result.  The patient is on Coumadin, however the ED provider reports that he did not take his medications this morning.  On exam, all cranial nerves are intact, speech is clear.  No facial asymmetry.  The wife reports that he is "quieter" than usual otherwise appears to be mentating the same he is at home.  His blood pressure on exam is elevated to 220U systolic.  A cervical spine CT was completed which did not reveal any cervical spine fracture, and he denies any tenderness to palpation over cervical spine.   Review of Systems:  A 10 point review of systems is negative, except for the pertinent positives and negatives detailed in the HPI.  Past Medical History: Past Medical History:  Diagnosis Date   Anemia    Antiphospholipid antibody syndrome (HCC)    Arthritis    DVT (deep venous thrombosis) (HCC)    Essential hypertension    Gastric ulcer    GERD (gastroesophageal reflux disease)    History of CMV    Hyperlipidemia    Hyperparathyroidism (HCC)    Pedal edema    Peripheral vascular disease (HCC)    Rosacea    Status post kidney transplant 2001   Stroke [facility])    TIA (transient ischemic attack)    Vascular dementia without behavioral disturbance ([place])     Past Surgical  History: Past Surgical History:  Procedure Laterality Date   CHOLECYSTECTOMY     COLONOSCOPY WITH PROPOFOL N/A 03/06/2015   Procedure: COLONOSCOPY WITH PROPOFOL;  Surgeon: Josefine Class, MD;  Location: [facility] ENDOSCOPY;  Service: Endoscopy;  Laterality: N/A;   ELBOW SURGERY     ESOPHAGOGASTRODUODENOSCOPY Left 03/01/2015   Procedure: ESOPHAGOGASTRODUODENOSCOPY (EGD);  Surgeon: Hulen Luster, MD;  Location: [facility] ENDOSCOPY;  Service: Endoscopy;  Laterality: Left;   ESOPHAGOGASTRODUODENOSCOPY (EGD) WITH PROPOFOL  03/06/2015   Procedure: ESOPHAGOGASTRODUODENOSCOPY (EGD) WITH PROPOFOL;  Surgeon: Josefine Class, MD;  Location: [facility] ENDOSCOPY;  Service: Endoscopy;;   ESOPHAGOGASTRODUODENOSCOPY (EGD) WITH PROPOFOL N/A 05/25/2015   Procedure: ESOPHAGOGASTRODUODENOSCOPY (EGD) WITH PROPOFOL;  Surgeon: Josefine Class, MD;  Location: [facility] ENDOSCOPY;  Service: Endoscopy;  Laterality: N/A;   KIDNEY TRANSPLANT     KNEE ARTHROSCOPY      Allergies: Allergies as of 08/20/2019 - Review Complete 08/20/2019  Allergen Reaction Noted   Losartan Cough 06/08/2015   Statins Nausea And Vomiting and Other (See Comments) 10/01/2014    Medications: No current facility-administered medications for this encounter.  Current Outpatient Medications:    ALPRAZolam (XANAX) 0.25 MG tablet, Take 0.25 mg by mouth 2 (two) times daily as needed., Disp: , Rfl:    amLODipine (NORVASC) 10 MG tablet, Take 10 mg by mouth daily., Disp: , Rfl:    budesonide-formoterol (SYMBICORT) 80-4.5 MCG/ACT inhaler, Inhale  2 puffs into the lungs 2 (two) times daily., Disp: , Rfl:    buPROPion (WELLBUTRIN XL) 300 MG 24 hr tablet, Take 300 mg by mouth daily., Disp: , Rfl:    calcitRIOL (ROCALTROL) 0.25 MCG capsule, Take 0.25 mcg by mouth at bedtime., Disp: , Rfl:    citalopram (CELEXA) 20 MG tablet, Take 20 mg by mouth daily., Disp: , Rfl:    dicyclomine (BENTYL) 10 MG capsule, Take 10 mg by mouth 3 (three) times daily  before meals. , Disp: , Rfl:    diphenoxylate-atropine (LOMOTIL) 2.5-0.025 MG tablet, Take 1 tablet by mouth 4 (four) times daily as needed for diarrhea or loose stools., Disp: , Rfl:    donepezil (ARICEPT) 10 MG tablet, Take 10 mg by mouth at bedtime., Disp: , Rfl:    fluorouracil (EFUDEX) 5 % cream, , Disp: , Rfl:    furosemide (LASIX) 40 MG tablet, Hold until followup with kidney doctor., Disp: 30 tablet, Rfl:    labetalol (NORMODYNE) 200 MG tablet, Take 200 mg by mouth 2 (two) times daily. , Disp: , Rfl:    magnesium oxide (MAG-OX) 400 MG tablet, TAKE ONE TABLET BY MOUTH TWICE DAILY, Disp: , Rfl:    Magnesium Oxide 400 (240 Mg) MG TABS, Take 1 tablet by mouth 2 (two) times daily., Disp: , Rfl:    Multiple Vitamin (MULTI-VITAMINS) TABS, Take 1 tablet by mouth daily. , Disp: , Rfl:    mycophenolate (CELLCEPT) 250 MG capsule, Take 250-500 mg by mouth 2 (two) times daily. 250 mg in the am and 500 mg at bedtime, Disp: , Rfl:    pantoprazole (PROTONIX) 40 MG tablet, Take 1 tablet (40 mg total) by mouth daily., Disp: , Rfl:    potassium chloride (KLOR-CON) 10 MEQ tablet, Hold until follow up with kidney doctor., Disp: , Rfl:    predniSONE (DELTASONE) 5 MG tablet, Take 5 mg by mouth daily., Disp: , Rfl:    warfarin (JANTOVEN) 1 MG tablet, Take 2-3 mg by mouth daily. Take two tablets (2mg ) on Sunday, Tuesday,Weds, Thursday and Saturday. Take three tablets (3 mg) on Monday and Friday., Disp: , Rfl:    Social History: Social History   Tobacco Use   Smoking status: Former Smoker    Packs/day: 1.00    Years: 30.00    Pack years: 30.00    Types: Cigarettes    Quit date: 1999    Years since quitting: 22.5   Smokeless tobacco: Never Used  Scientific laboratory technician Use: Never used  Substance Use Topics   Alcohol use: No   Drug use: No    Family Medical History: No family history on file.  Physical Examination: Vitals:   08/20/19 0709 08/20/19 0933  BP: (!) 126/95 (!) 200/105   Pulse: 91   Resp: 20   Temp: 98.2 F (36.8 C)   SpO2: 99%      General: [name] well developed, well nourished, calm, collected, and in no apparent distress.  Psychiatric: [name] non-anxious.  Head:  Pupils equal, round, and reactive to light.  Bruising and swelling noted to left temporal area  ENT:  Oral mucosa appears well hydrated.  Neck:   Supple.  No tenderness to palpation over cervical spine  Respiratory: [name] breathing without any difficulty.   NEUROLOGICAL:  General: In no acute distress.    Awake, alert, oriented to person, place, and season.  Names 3 out of 3 objects correctly.  Able to count to 10 without  issue.  Speech clear, no dysphagia noted. Pupils equal round and reactive to light. EOMI, no nystagmus.  Facial tone is symmetric.   Face symmetric.  Tongue protrusion is midline.   There is no pronator drift. Moving bilateral upper and lower extremities symmetrically and with equal full strength bilaterally.  No dysmetria on finger-to-nose test bilaterally.   Imaging: CT head 08/20/2019: IMPRESSION: 1. Acute left temporal subdural hematoma with maximum thickness of 3 mm. No midline shift. Minimal mass effect on the immediately adjacent brain parenchyma.  2. Foci of contusion in the left frontal lobe with largest focal area of contusion measuring approximately 1 x 1 cm. Minimal surrounding edema.  3. Atrophy with supratentorial small vessel disease. Prior basal ganglia infarcts bilaterally as well as prior infarct in the medial left occipital lobe and at the gray-white junction of the right frontal lobe. No acute infarct evident.  4.  Foci of arterial vascular calcification noted.  5.  Left frontal scalp hematoma.  No fracture.  6.  Extensive right mastoid air cell opacification.  7.  Multifocal paranasal sinus disease.   Assessment and Plan: Mr. Yoho is a pleasant 69 y.o. male with left temporal subdural hematoma of 3  mm, no midline shift or mass-effect, after a fall this morning in the bathroom.  No acute neurosurgical intervention at this time, as his exam remains intact.   Recommend repeat head CT in 6 hours from initial.  Please reduce blood pressure to prevent hematoma expansion, goal of less than 300 systolic.  INR this morning is 1.5, would hold Coumadin for the next 2 weeks. No reversal indicated.  We will plan to see him in 2 weeks with a repeat head CT, prior to restarting his Coumadin.   We will follow up with the results of his repeat head CT and if stable, no further intervention from a neurosurgical standpoint.    Lonell Face, NP Dept. of Neurosurgery

## 2019-08-21 ENCOUNTER — Ambulatory Visit: Payer: Medicare Other | Admitting: Physical Therapy

## 2019-08-21 ENCOUNTER — Ambulatory Visit: Payer: Medicare Other

## 2019-08-21 ENCOUNTER — Other Ambulatory Visit: Payer: Self-pay | Admitting: Nurse Practitioner

## 2019-08-21 DIAGNOSIS — S065XAA Traumatic subdural hemorrhage with loss of consciousness status unknown, initial encounter: Secondary | ICD-10-CM

## 2019-08-26 ENCOUNTER — Ambulatory Visit: Payer: Medicare Other

## 2019-08-28 ENCOUNTER — Ambulatory Visit: Payer: Medicare Other

## 2019-08-30 ENCOUNTER — Other Ambulatory Visit: Payer: Self-pay

## 2019-08-30 ENCOUNTER — Ambulatory Visit
Admission: RE | Admit: 2019-08-30 | Discharge: 2019-08-30 | Disposition: A | Discharge status: Home / Self Care | Payer: Medicare Other | Source: Ambulatory Visit | Attending: Nurse Practitioner | Admitting: Nurse Practitioner

## 2019-08-30 DIAGNOSIS — S065XAA Traumatic subdural hemorrhage with loss of consciousness status unknown, initial encounter: Secondary | ICD-10-CM

## 2019-08-30 DIAGNOSIS — S065X9A Traumatic subdural hemorrhage with loss of consciousness of unspecified duration, initial encounter: Secondary | ICD-10-CM | POA: Diagnosis present

## 2019-09-02 ENCOUNTER — Ambulatory Visit: Payer: Medicare Other

## 2019-09-04 ENCOUNTER — Ambulatory Visit: Payer: Medicare Other

## 2019-09-09 ENCOUNTER — Ambulatory Visit: Payer: Medicare Other

## 2019-09-11 ENCOUNTER — Ambulatory Visit: Payer: Medicare Other

## 2019-09-12 ENCOUNTER — Telehealth: Payer: Self-pay | Admitting: Adult Health Nurse Practitioner

## 2019-09-12 NOTE — Telephone Encounter (Signed)
Spoke with patient's wife, Rodena Piety, regarding Palliative services and all questions were answered and she was in agreement with scheduling visit.  I have scheduled an In-person Consult for 09/20/19 @ 3 PM.

## 2019-09-16 ENCOUNTER — Other Ambulatory Visit: Payer: Medicare Other | Admitting: Adult Health Nurse Practitioner

## 2019-09-16 ENCOUNTER — Other Ambulatory Visit: Payer: Self-pay

## 2019-09-16 DIAGNOSIS — F0150 Vascular dementia without behavioral disturbance: Secondary | ICD-10-CM

## 2019-09-16 DIAGNOSIS — Z515 Encounter for palliative care: Secondary | ICD-10-CM

## 2019-09-18 ENCOUNTER — Ambulatory Visit: Payer: Medicare Other

## 2019-09-19 ENCOUNTER — Ambulatory Visit: Payer: Medicare Other

## 2019-09-23 ENCOUNTER — Ambulatory Visit: Payer: Medicare Other

## 2019-09-25 ENCOUNTER — Ambulatory Visit: Payer: Medicare Other

## 2019-09-30 ENCOUNTER — Ambulatory Visit: Payer: Medicare Other

## 2019-10-02 ENCOUNTER — Ambulatory Visit: Payer: Medicare Other

## 2019-10-09 ENCOUNTER — Ambulatory Visit: Payer: Medicare Other

## 2019-10-09 NOTE — Progress Notes (Signed)
[place] Consult Note Telephone: [phone]  Fax: [phone]  PATIENT NAME: Eric Gill DOB: 22-Feb-1950 MRN: [id number]  PRIMARY CARE PROVIDER:   Idelle Crouch, MD  REFERRING PROVIDER:  Idelle Crouch, MD Eric Gill [place],  [place] [address]  RESPONSIBLE PARTY:   Eric Gill, wife [phone]    RECOMMENDATIONS and PLAN:  1.  Advanced care planning.  Discussed with wife and in agreement with making patient DNR  2. Wife states that they took him out to a doctor's appointment last Wednesday and he collapsed going down the ramp.  She was able to catch him before he fell.  States that he has been less talkative and responsive since then.  She believes he may have had a stroke.  Today patient only opens eyes to voice.  Has mottling to lower extremities.  Family states he has periods of apnea.  His O2 is 72 % on RA.  He is in no distress and looks comfortable.  Patient looks emaciated and wife states that he hasn't eaten anything last Wednesday.  He is still drinking sips. States that he will have periods of agitation.  Patient has signs of actively dying.  Eprescribed roxanol 20mg /ml--give 0.25 ml every 4 hours PRN for pain/SOB and ativan 0.5 mg every 4 hours PRN for anxiety/agitation.  Instructed family that these can be given SL.  Reached out to PCP for hospice referral and called referral intake to let know to expect the referral and the urgency of getting hospice on board ASAP.  PCP did give VO for hospice and that he would be attending and referral intake notified.  Stated that they did not have an AV nurse to come out today but was going to do an immediate needs visit this evening but would go ahead and order oxygen supplies and hospital bed.  Wife called later in the afternoon to inform that patient had passed away. Instructed to call EMS since hospice did not get started before his passing.    I spent  120 minutes providing this consultation,  from 10:30 to 12:30 including time spent with patient/family, chart review, provider coordination, documentation. More than 50% of the time in this consultation was spent coordinating communication.   HISTORY OF PRESENT ILLNESS:  Eric Gill is a 69 y.o. year old male with multiple medical problems including vascular dementia, COPD, HLD, KTN, venous insufficiency, antiphospholipid antibody syndrome. Palliative Care was asked to help address goals of care. Patient has been hospitalized 3 times in the past 8 months.  In January this year he was hospitalized due to complications post kidney transplant and for pneumonia.  Last hospitalization was July this year for subdural hematoma after a fall.  Subsequent CT showed resolution of the subdural hematoma.    CODE STATUS: see above  PPS: 20% HOSPICE ELIGIBILITY/DIAGNOSIS: TBD  PHYSICAL EXAM:  HR 42  O2 72% on RA General: NAD, frail appearing, thin Cardiovascular: regular rate and rhythm; bradycardia Pulmonary: clear ant fields Abdomen: emaciated,soft, nontender, + bowel sounds GU: no suprapubic tenderness Extremities: no edema, no joint deformities Skin: mottling noted to bilateral feet and ankles with very weak pulses Neurological: patient will open eyes to voice but other than that is unresponsive   PAST MEDICAL HISTORY:  Past Medical History:  Diagnosis Date  . Anemia   . Antiphospholipid antibody syndrome (Eric Gill)   . Arthritis   . DVT (deep venous thrombosis) (Eric Gill)   .  Essential hypertension   . Gastric ulcer   . GERD (gastroesophageal reflux disease)   . History of CMV   . Hyperlipidemia   . Hyperparathyroidism (Eric Gill)   . Pedal edema   . Peripheral vascular disease (Eric Gill)   . Rosacea   . Status post kidney transplant 2001  . Stroke (Eric Gill)   . TIA (transient ischemic attack)   . Vascular dementia without behavioral disturbance (Eric Gill)     SOCIAL HX:  Social History   Tobacco Use  .  Smoking status: Former Smoker    Packs/day: 1.00    Years: 30.00    Pack years: 30.00    Types: Cigarettes    Quit date: 1999    Years since quitting: 22.6  . Smokeless tobacco: Never Used  Substance Use Topics  . Alcohol use: No    ALLERGIES:  Allergies  Allergen Reactions  . Losartan Cough  . Statins Nausea And Vomiting and Other (See Comments)    Reaction:  Muscle pain      PERTINENT MEDICATIONS:  Outpatient Encounter Medications as of October 11, 2019  Medication Sig  . acetaminophen (TYLENOL) 325 MG tablet Take 500 mg by mouth every 6 (six) hours as needed.  . ALPRAZolam (XANAX) 0.25 MG tablet Take 0.25 mg by mouth 2 (two) times daily as needed.  [name] amLODipine (NORVASC) 10 MG tablet Take 10 mg by mouth daily. Take 1/2 t qam  . budesonide-formoterol (SYMBICORT) 80-4.5 MCG/ACT inhaler Inhale 2 puffs into the lungs 2 (two) times daily.  [name] buPROPion (WELLBUTRIN XL) 300 MG 24 hr tablet Take 300 mg by mouth daily.  . calcitRIOL (ROCALTROL) 0.25 MCG capsule Take 0.25 mcg by mouth at bedtime.  . citalopram (CELEXA) 20 MG tablet Take 20 mg by mouth daily. (Patient not taking: Reported on 08/20/2019)  . desvenlafaxine (PRISTIQ) 50 MG 24 hr tablet Take 50 mg by mouth daily.  [name] dicyclomine (BENTYL) 10 MG capsule Take 10 mg by mouth 3 (three) times daily before meals.   . diphenoxylate-atropine (LOMOTIL) 2.5-0.025 MG tablet Take 1 tablet by mouth 4 (four) times daily as needed for diarrhea or loose stools.  . donepezil (ARICEPT) 10 MG tablet Take 10 mg by mouth at bedtime.  . fluorouracil (EFUDEX) 5 % cream  (Patient not taking: Reported on 08/20/2019)  . furosemide (LASIX) 40 MG tablet Hold until followup with kidney doctor. (Patient taking differently: Take 40 mg by mouth daily. )  . labetalol (NORMODYNE) 200 MG tablet Take 200 mg by mouth 2 (two) times daily.   . magnesium oxide (MAG-OX) 400 MG tablet TAKE ONE TABLET BY MOUTH TWICE DAILY  . Multiple Vitamin (MULTI-VITAMINS) TABS Take 1 tablet by  mouth daily.   . mycophenolate (CELLCEPT) 250 MG capsule Take 250-500 mg by mouth 2 (two) times daily. 250 mg in the am and 500 mg at bedtime  . pantoprazole (PROTONIX) 40 MG tablet Take 1 tablet (40 mg total) by mouth daily.  . potassium chloride (KLOR-CON) 10 MEQ tablet Hold until follow up with kidney doctor. (Patient taking differently: Take 10 mEq by mouth 2 (two) times daily. )  . predniSONE (DELTASONE) 5 MG tablet Take 5 mg by mouth daily.  [name] warfarin (JANTOVEN) 1 MG tablet Take 2-3 mg by mouth daily. Take two tablets (2.5mg ) on Sunday, Tuesday,Weds, Thursday and Saturday. Take three tablets (3 mg) on Monday and Friday.   No facility-administered encounter medications on file as of 10-11-19.     Treshaun Carrico Jenetta Downer, NP

## 2019-10-09 DEATH — deceased

## 2020-08-08 IMAGING — US US RENAL
1 series · 14 of 25 positions shown · non-contrast
Comparison: 09/29/2017

CLINICAL DATA: Acute renal failure.

EXAM:
RENAL / URINARY TRACT ULTRASOUND COMPLETE

[Series 1: us renal · 14 of 68 slices shown]
[im 1/68]
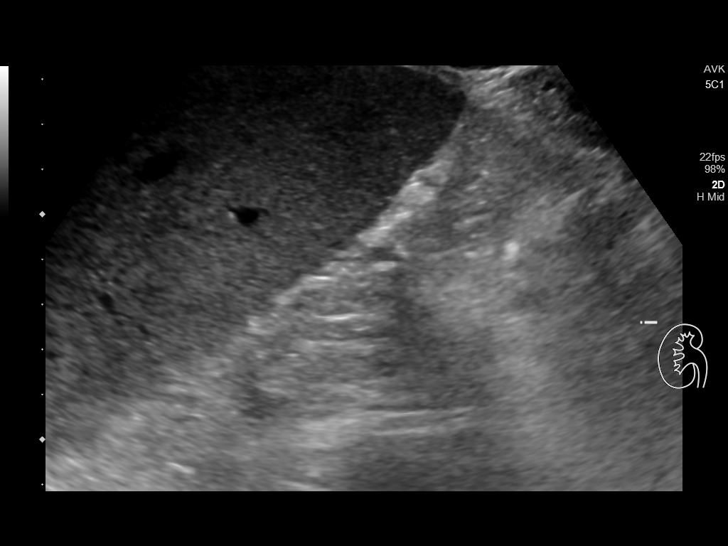
[im 6/68]
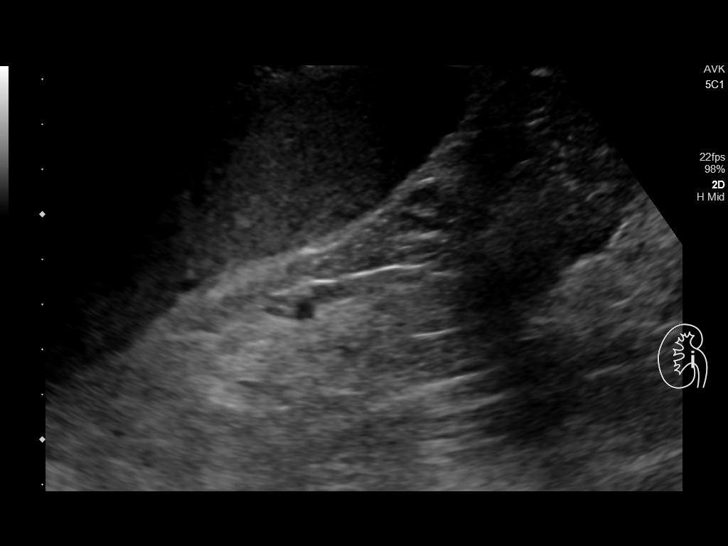
[im 12/68]
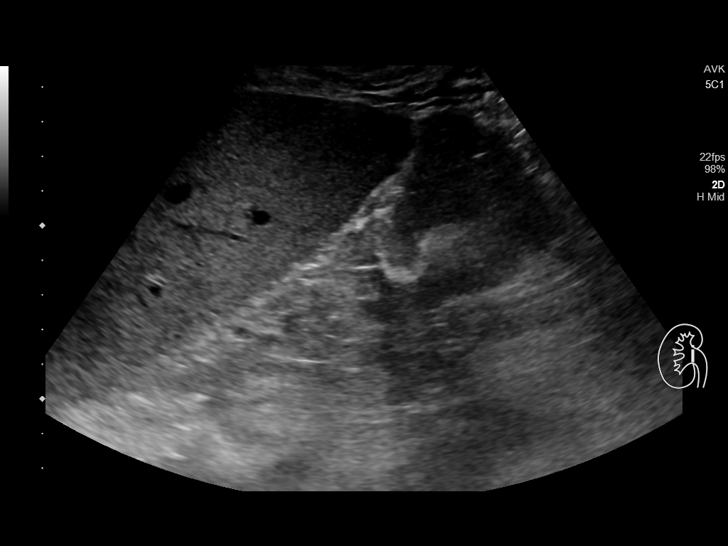
[im 17/68]
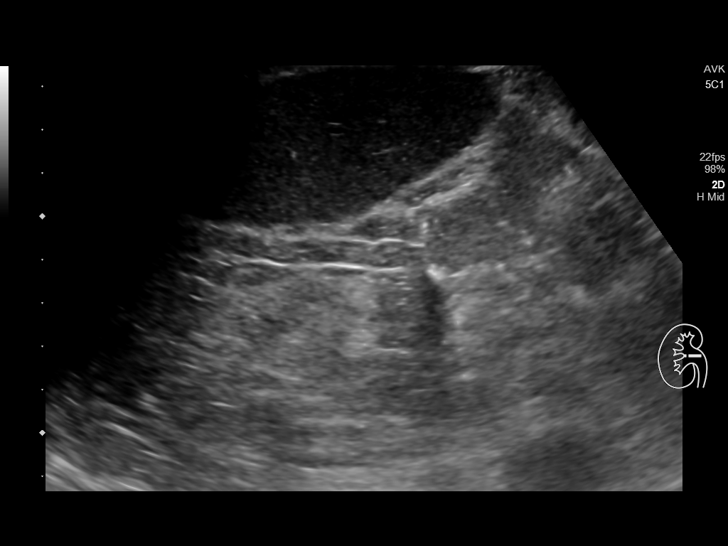
[im 23/68]
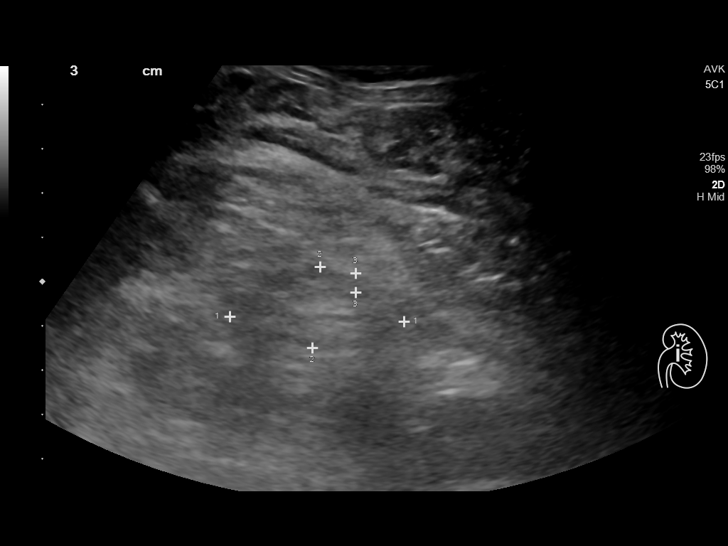
[im 26/68]
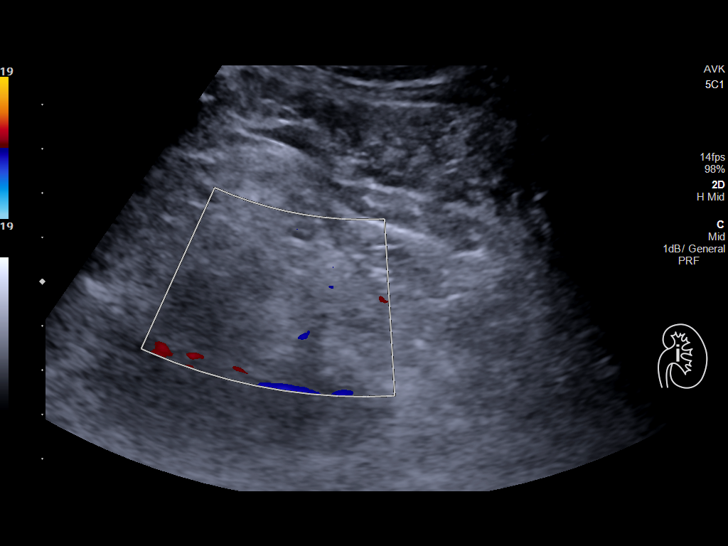
[im 31/68]
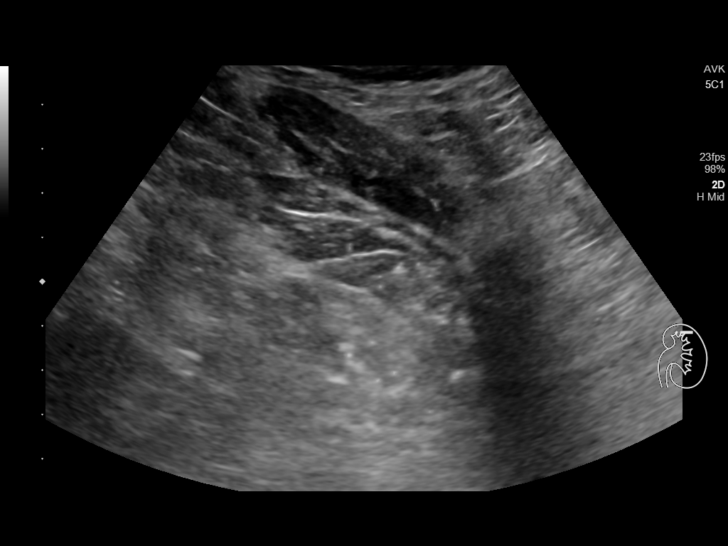
[im 37/68]
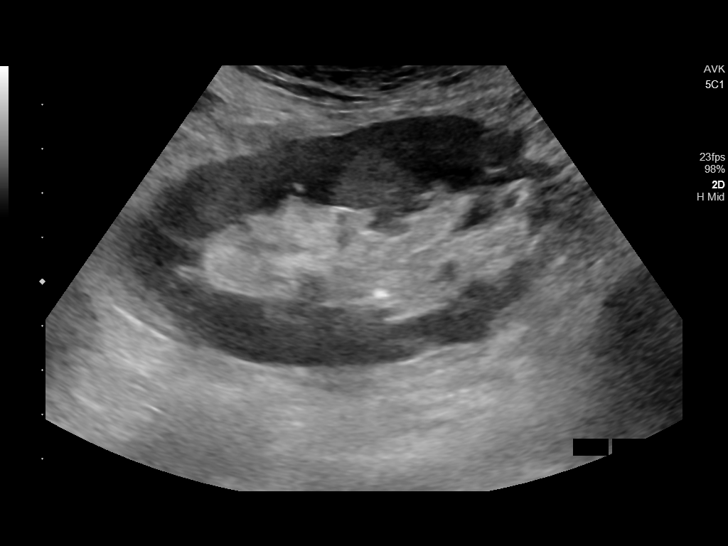
[im 42/68]
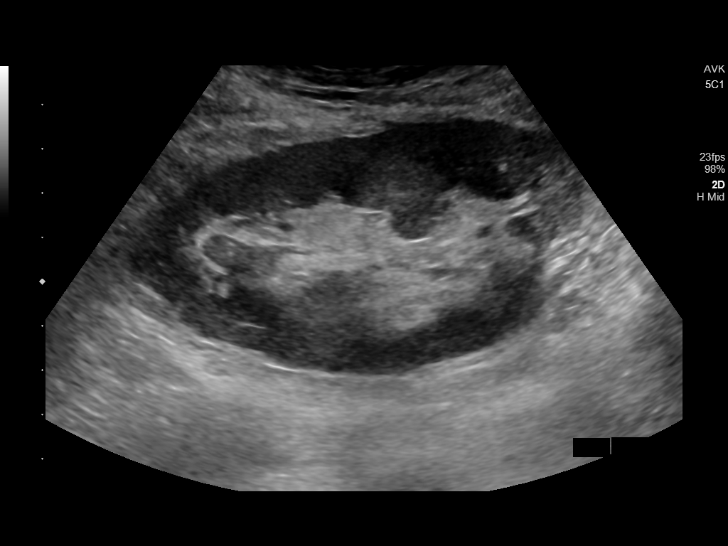
[im 45/68]
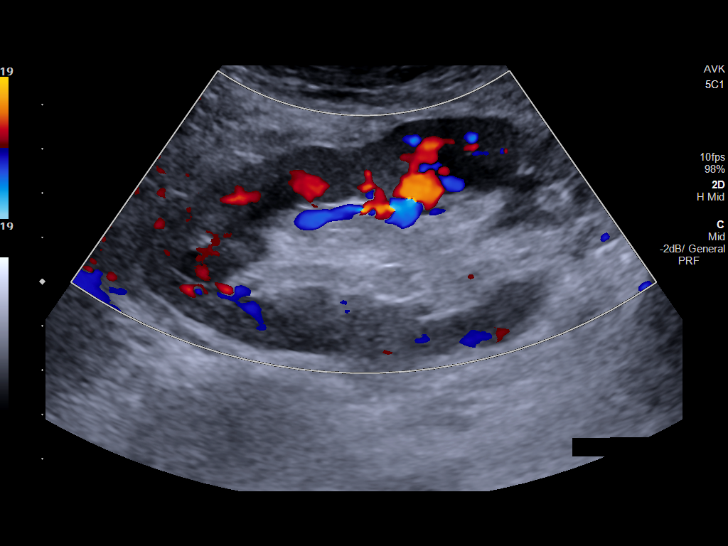
[im 51/68]
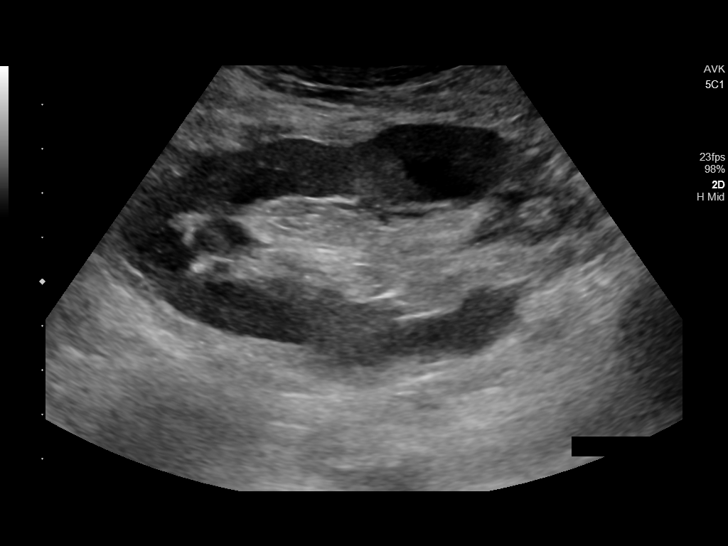
[im 56/68]
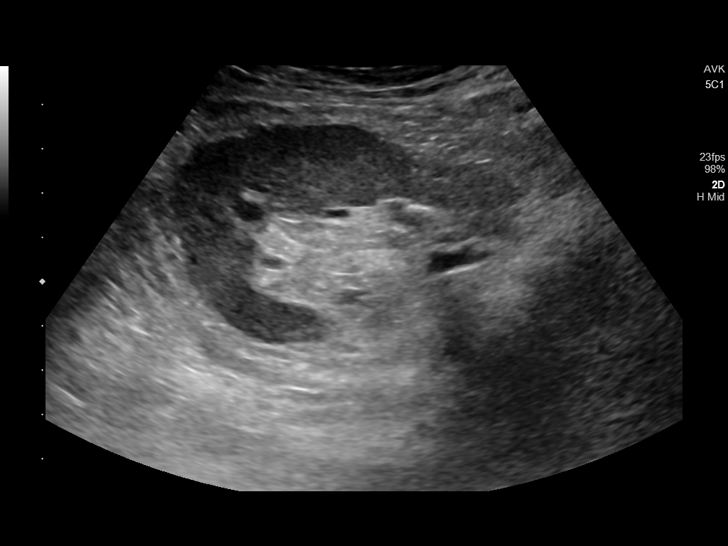
[im 62/68]
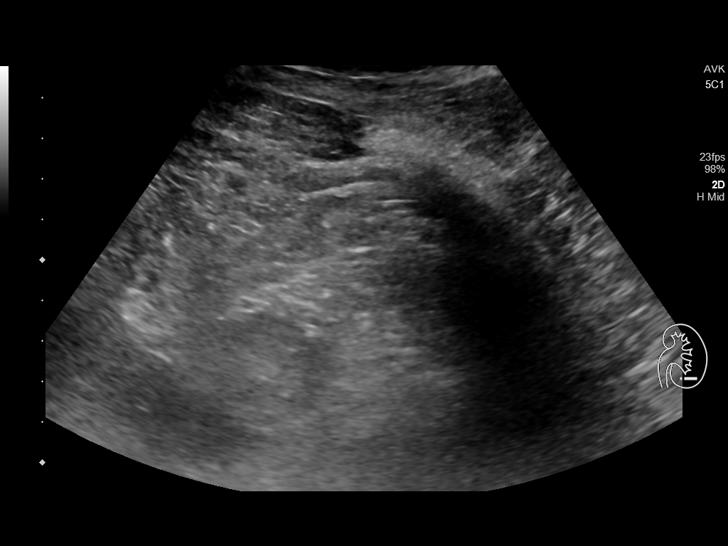
[im 68/68]
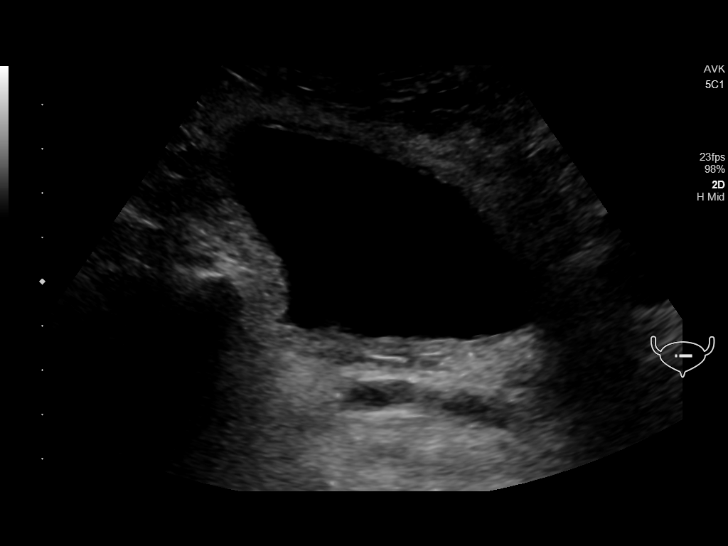

[14 of 25 positions shown; findings below may reference images not displayed]

FINDINGS: Right Kidney:

Renal measurements: 3.4 x 1.6 x 1.8 cm. = volume: 5.5 mL. Diffuse
increased echogenicity. No mass or hydronephrosis.

Left Kidney:

Renal measurements: 3.9 x 1.8 x 1.9 cm = volume: 7.2 mL. Diffuse
increased echogenicity. No mass or hydronephrosis.

Other: Left lower quadrant renal graft measures 10.5 x 5.4 x 4.8 cm.
Normal echogenicity. No hydronephrosis.

Bladder:

Appears normal for degree of bladder distention.
IMPRESSION: 1. Normal appearance of left lower quadrant renal graft.
2. Bilateral atrophic and echogenic native kidneys.

## 2020-08-12 IMAGING — DX DG CHEST 1V
1 series · 1 of 1 positions shown · non-contrast
Comparison: 10/20/2018

CLINICAL DATA: Dyspnea.

EXAM:
CHEST  1 VIEW

[chest ap]
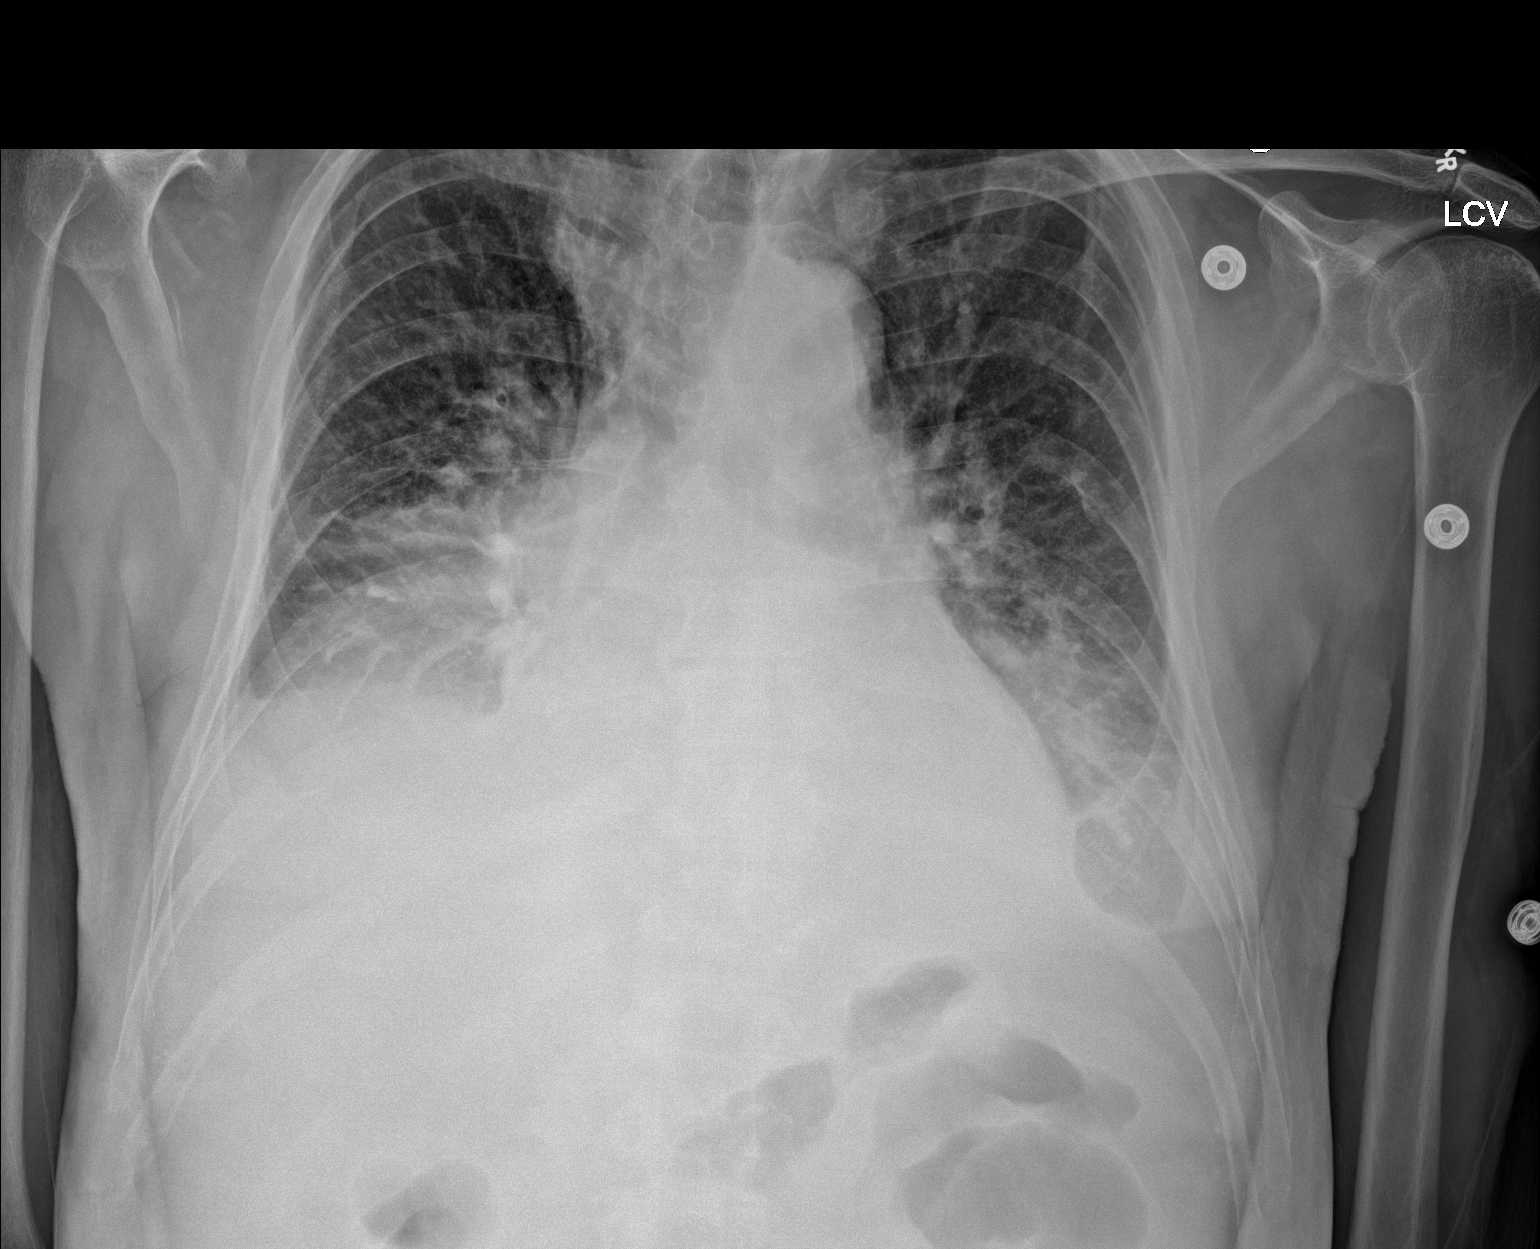

[1 of 1 positions shown; findings below may reference images not displayed]

FINDINGS: Lungs are adequately inflated with persistent bilateral perihilar
bibasilar opacification likely mild vascular congestion with small
bilateral pleural effusions and associated atelectasis. Infection is
less likely. Cardiomediastinal silhouette and remainder of the exam
is unchanged.
IMPRESSION: Persistent bilateral perihilar bibasilar opacification likely
vascular congestion with small bilateral pleural effusions and
associated basilar atelectasis. Infection less likely.

## 2021-06-11 IMAGING — CR DG SHOULDER 2+V*L*
1 series · 3 of 3 positions shown · non-contrast
Comparison: None.

CLINICAL DATA: Pain following fall

EXAM:
LEFT SHOULDER - 2+ VIEW

[Series 1: dg shoulder left · 0.14mm/px · 3 of 3 slices shown]
[im 1/3]
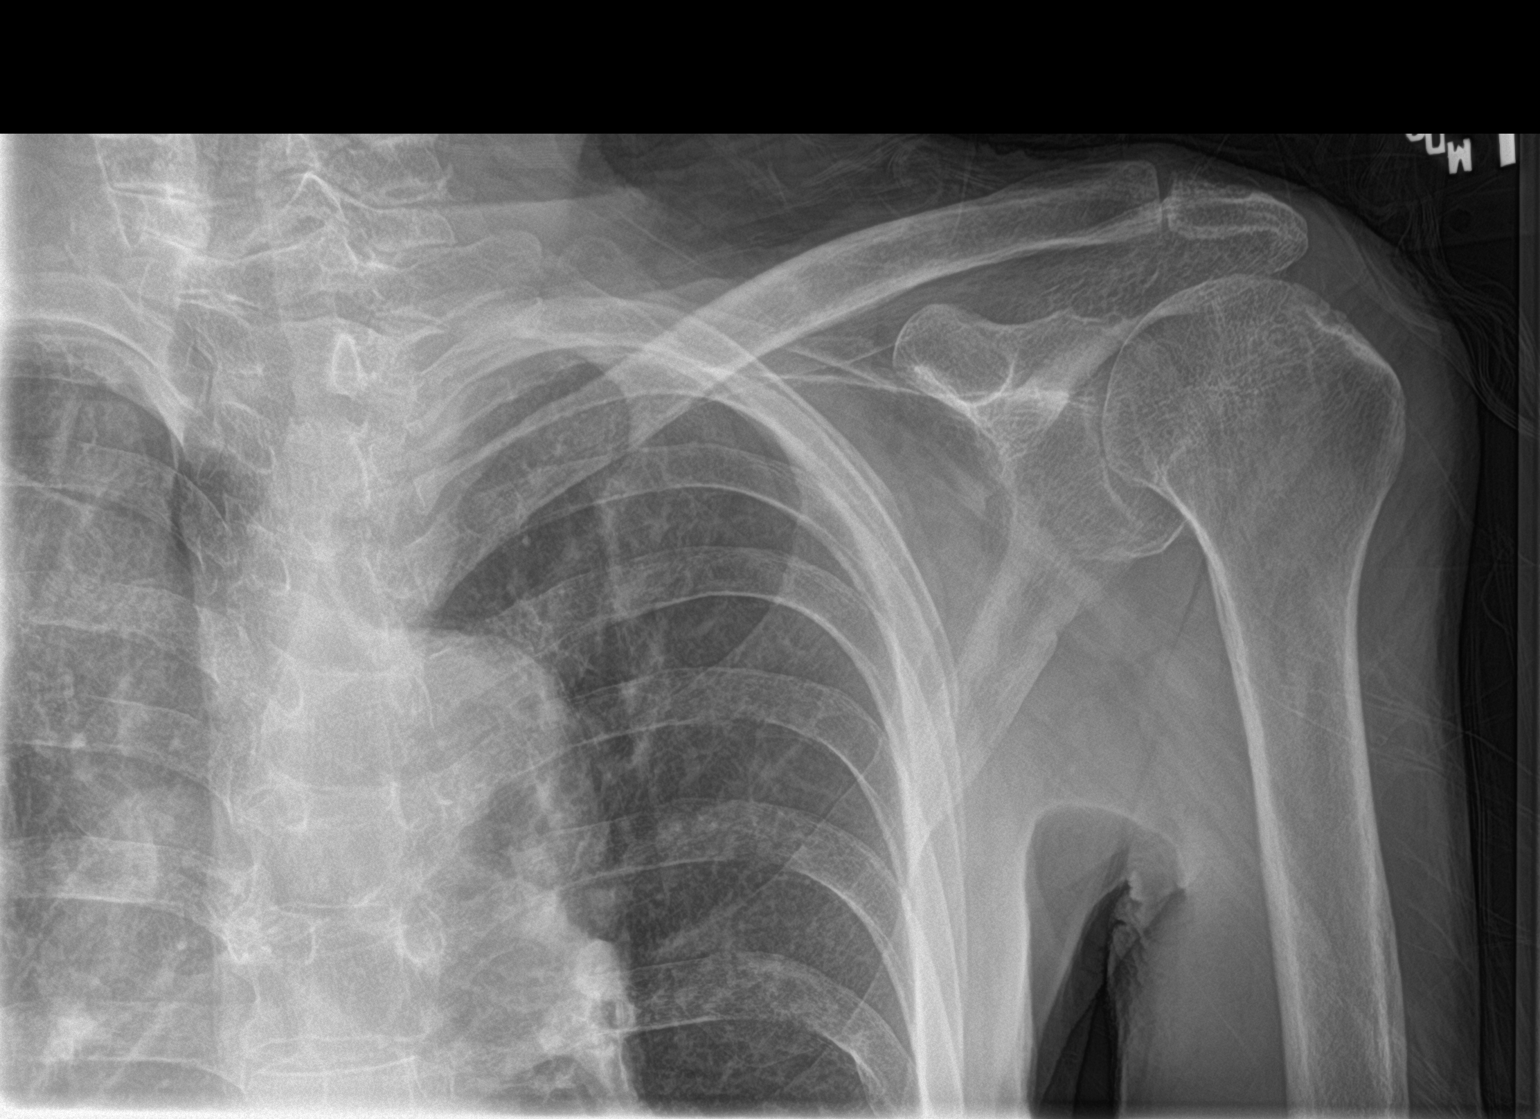
[im 2/3]
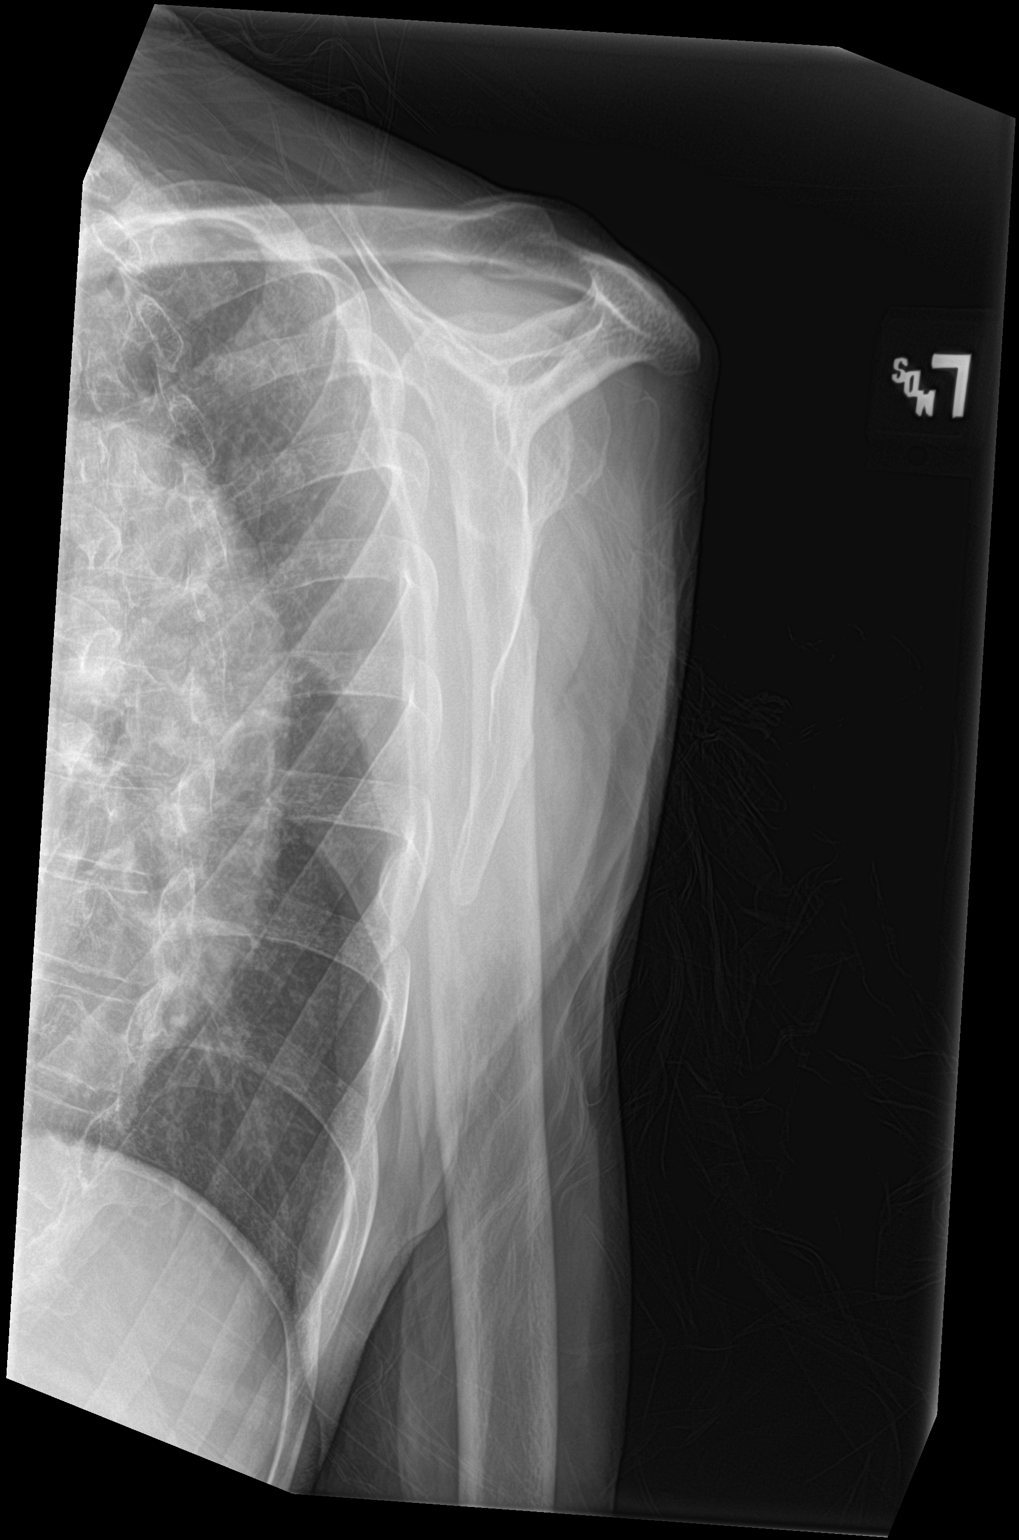
[im 3/3]
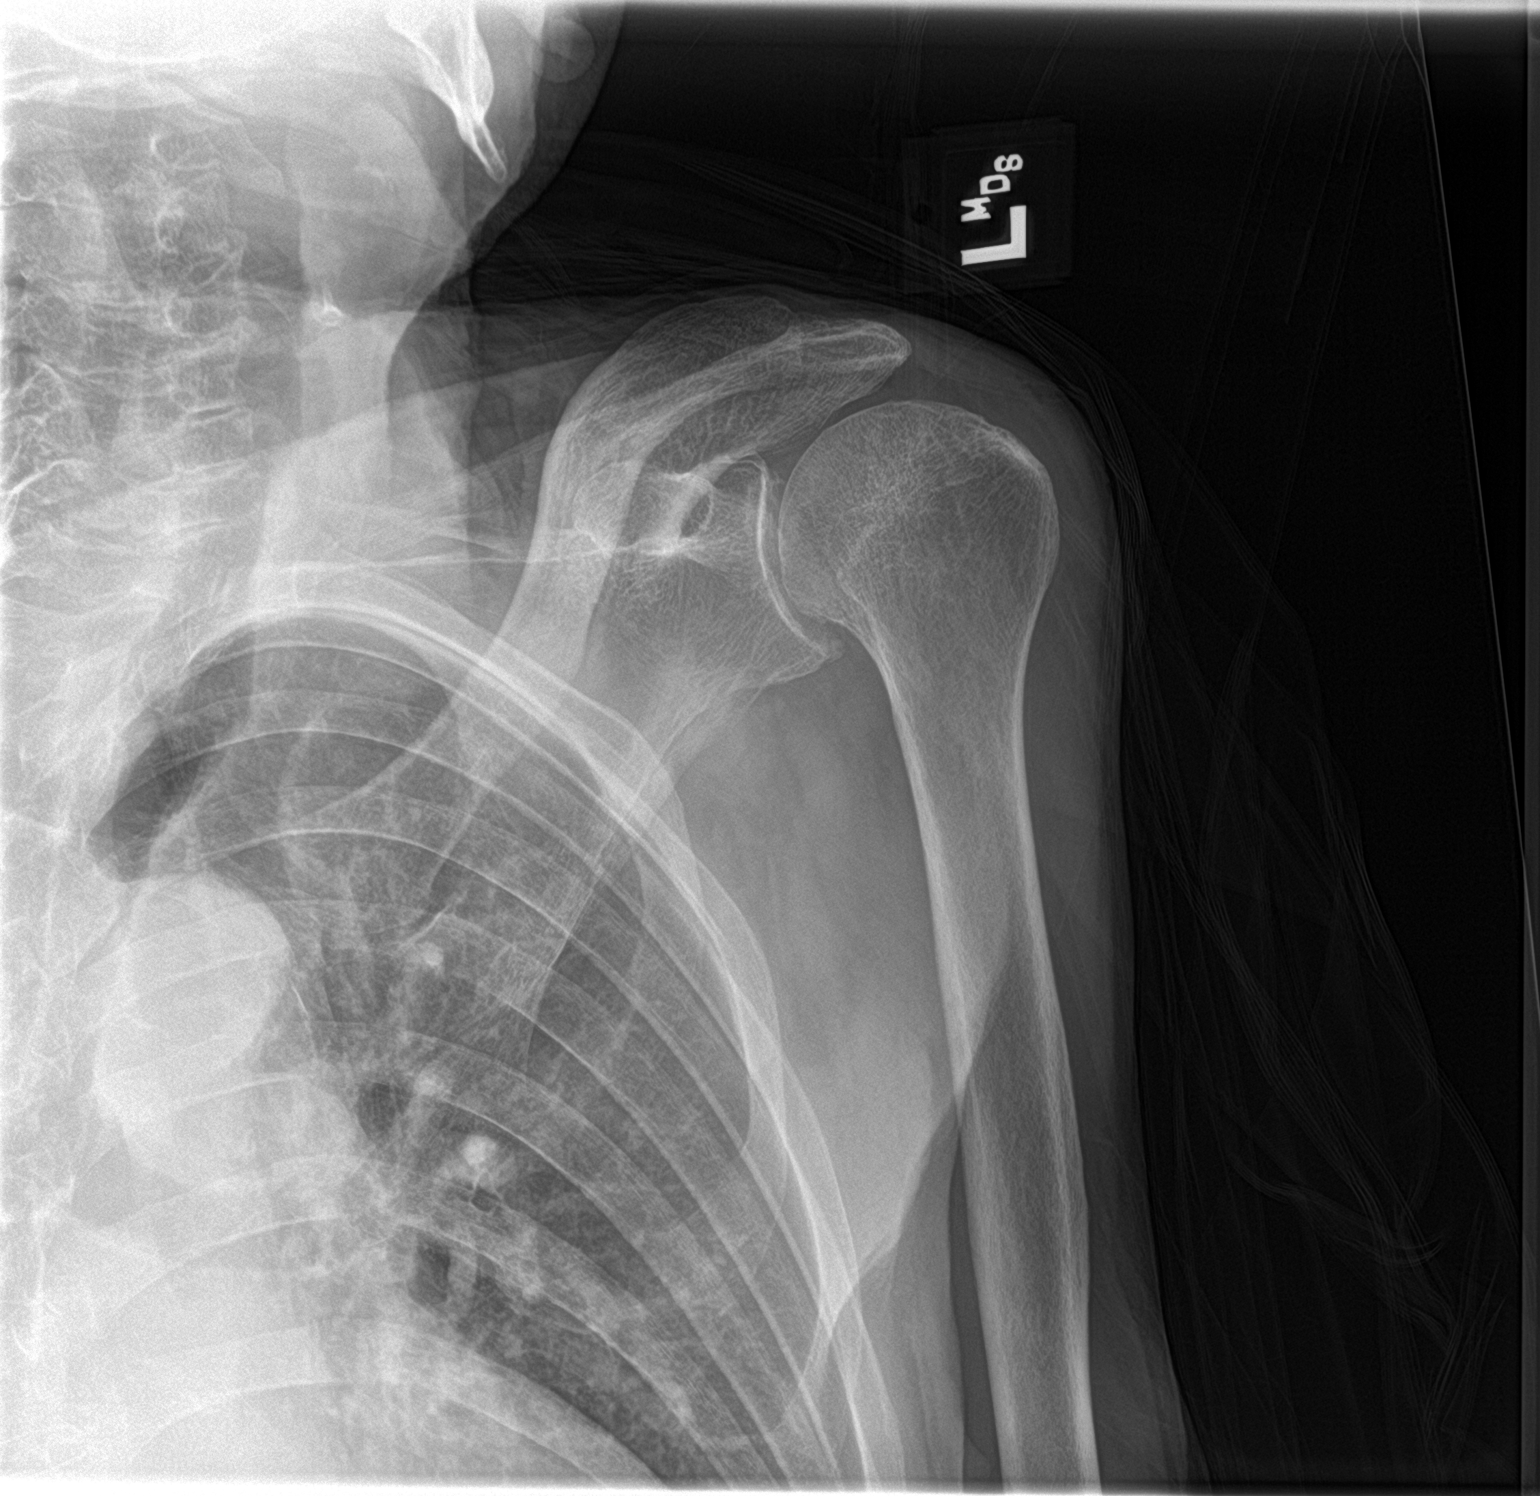

[3 of 3 positions shown; findings below may reference images not displayed]

FINDINGS: Frontal, oblique, and Y scapular images were obtained. No evident
fracture or dislocation. There is moderate generalized joint space
narrowing. No erosive change. Visualized left lung clear.
IMPRESSION: Moderate generalized osteoarthritic change. No fracture or
dislocation.
